# Patient Record
Sex: Female | Born: 1955 | Hispanic: Yes | Smoking: Never smoker
Health system: Southern US, Community
[De-identification: ages and names within clinical notes are randomized; demographics above are authoritative.]

## PROBLEM LIST (undated history)

## (undated) DIAGNOSIS — J40 Bronchitis, not specified as acute or chronic: Secondary | ICD-10-CM

## (undated) DIAGNOSIS — E039 Hypothyroidism, unspecified: Secondary | ICD-10-CM

## (undated) DIAGNOSIS — F4310 Post-traumatic stress disorder, unspecified: Secondary | ICD-10-CM

## (undated) DIAGNOSIS — M1990 Unspecified osteoarthritis, unspecified site: Secondary | ICD-10-CM

## (undated) DIAGNOSIS — I639 Cerebral infarction, unspecified: Secondary | ICD-10-CM

## (undated) DIAGNOSIS — F329 Major depressive disorder, single episode, unspecified: Secondary | ICD-10-CM

## (undated) DIAGNOSIS — D649 Anemia, unspecified: Secondary | ICD-10-CM

## (undated) DIAGNOSIS — F32A Depression, unspecified: Secondary | ICD-10-CM

## (undated) DIAGNOSIS — F419 Anxiety disorder, unspecified: Secondary | ICD-10-CM

## (undated) DIAGNOSIS — K219 Gastro-esophageal reflux disease without esophagitis: Secondary | ICD-10-CM

## (undated) DIAGNOSIS — M810 Age-related osteoporosis without current pathological fracture: Secondary | ICD-10-CM

## (undated) DIAGNOSIS — Z973 Presence of spectacles and contact lenses: Secondary | ICD-10-CM

## (undated) DIAGNOSIS — G459 Transient cerebral ischemic attack, unspecified: Secondary | ICD-10-CM

## (undated) DIAGNOSIS — I10 Essential (primary) hypertension: Secondary | ICD-10-CM

## (undated) HISTORY — DX: Transient cerebral ischemic attack, unspecified: G45.9

## (undated) HISTORY — DX: Presence of spectacles and contact lenses: Z97.3

## (undated) HISTORY — DX: Cerebral infarction, unspecified: I63.9

## (undated) HISTORY — DX: Depression, unspecified: F32.A

## (undated) HISTORY — DX: Age-related osteoporosis without current pathological fracture: M81.0

## (undated) HISTORY — DX: Essential (primary) hypertension: I10

## (undated) HISTORY — DX: Hypothyroidism, unspecified: E03.9

## (undated) HISTORY — PX: BACK SURGERY: SHX140

## (undated) HISTORY — DX: Post-traumatic stress disorder, unspecified: F43.10

## (undated) HISTORY — PX: UPPER GASTROINTESTINAL ENDOSCOPY: SHX188

## (undated) HISTORY — DX: Anxiety disorder, unspecified: F41.9

## (undated) HISTORY — PX: BREAST SURGERY: SHX581

## (undated) HISTORY — DX: Unspecified osteoarthritis, unspecified site: M19.90

## (undated) HISTORY — PX: COLONOSCOPY: SHX174

---
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
## 1989-09-22 HISTORY — PX: VEIN SURGERY: SHX48

## 2004-09-22 HISTORY — PX: AUGMENTATION MAMMAPLASTY: SUR837

## 2019-06-23 DIAGNOSIS — U071 COVID-19: Secondary | ICD-10-CM

## 2019-06-23 HISTORY — DX: COVID-19: U07.1

## 2019-07-08 ENCOUNTER — Other Ambulatory Visit: Payer: Self-pay

## 2019-07-08 DIAGNOSIS — Z20822 Contact with and (suspected) exposure to covid-19: Secondary | ICD-10-CM

## 2019-07-10 LAB — NOVEL CORONAVIRUS, NAA: SARS-CoV-2, NAA: DETECTED — AB

## 2019-07-15 ENCOUNTER — Other Ambulatory Visit: Payer: Self-pay

## 2019-07-15 DIAGNOSIS — R071 Chest pain on breathing: Secondary | ICD-10-CM | POA: Insufficient documentation

## 2019-07-15 DIAGNOSIS — U071 COVID-19: Secondary | ICD-10-CM | POA: Insufficient documentation

## 2019-07-15 DIAGNOSIS — R0600 Dyspnea, unspecified: Secondary | ICD-10-CM | POA: Insufficient documentation

## 2019-07-15 LAB — CBC WITH DIFFERENTIAL/PLATELET
Abs Immature Granulocytes: 0.01 10*3/uL (ref 0.00–0.07)
Basophils Absolute: 0 10*3/uL (ref 0.0–0.1)
Basophils Relative: 0 %
Eosinophils Absolute: 0 10*3/uL (ref 0.0–0.5)
Eosinophils Relative: 0 %
HCT: 36.3 % (ref 36.0–46.0)
Hemoglobin: 12.5 g/dL (ref 12.0–15.0)
Immature Granulocytes: 0 %
Lymphocytes Relative: 24 %
Lymphs Abs: 0.9 10*3/uL (ref 0.7–4.0)
MCH: 29.3 pg (ref 26.0–34.0)
MCHC: 34.4 g/dL (ref 30.0–36.0)
MCV: 85.2 fL (ref 80.0–100.0)
Monocytes Absolute: 0.3 10*3/uL (ref 0.1–1.0)
Monocytes Relative: 7 %
Neutro Abs: 2.6 10*3/uL (ref 1.7–7.7)
Neutrophils Relative %: 69 %
Platelets: 196 10*3/uL (ref 150–400)
RBC: 4.26 MIL/uL (ref 3.87–5.11)
RDW: 12.8 % (ref 11.5–15.5)
WBC: 3.8 10*3/uL — ABNORMAL LOW (ref 4.0–10.5)
nRBC: 0 % (ref 0.0–0.2)

## 2019-07-15 NOTE — ED Notes (Signed)
Patient to waiting room via wheelchair by EMS.  Per EMS patient received positive COVID results Friday last week.  Started with fever yesterday and today and having chest tightness.  EMS vital signs:  Hr - 70's; bp 150/90; pulse oxi 97-98% on room air.

## 2019-07-15 NOTE — ED Triage Notes (Signed)
Patient reports being positive for COVID (received results 1 week ago).  Yesterday with fever and chest tightness.  Also reports having right flank pain.

## 2019-07-16 ENCOUNTER — Emergency Department: Payer: Self-pay

## 2019-07-16 ENCOUNTER — Emergency Department
Admission: EM | Admit: 2019-07-16 | Discharge: 2019-07-16 | Disposition: A | Discharge status: Home / Self Care | Payer: Self-pay | Attending: Emergency Medicine | Admitting: Emergency Medicine

## 2019-07-16 DIAGNOSIS — U071 COVID-19: Secondary | ICD-10-CM

## 2019-07-16 DIAGNOSIS — R0600 Dyspnea, unspecified: Secondary | ICD-10-CM

## 2019-07-16 DIAGNOSIS — R079 Chest pain, unspecified: Secondary | ICD-10-CM

## 2019-07-16 LAB — COMPREHENSIVE METABOLIC PANEL
ALT: 121 U/L — ABNORMAL HIGH (ref 0–44)
AST: 93 U/L — ABNORMAL HIGH (ref 15–41)
Albumin: 4.2 g/dL (ref 3.5–5.0)
Alkaline Phosphatase: 60 U/L (ref 38–126)
Anion gap: 13 (ref 5–15)
BUN: 21 mg/dL (ref 8–23)
CO2: 25 mmol/L (ref 22–32)
Calcium: 9.6 mg/dL (ref 8.9–10.3)
Chloride: 101 mmol/L (ref 98–111)
Creatinine, Ser: 0.6 mg/dL (ref 0.44–1.00)
GFR calc Af Amer: >60 mL/min (ref >60)
GFR calc non Af Amer: >60 mL/min (ref >60)
Glucose, Bld: 104 mg/dL — ABNORMAL HIGH (ref 70–99)
Potassium: 3.8 mmol/L (ref 3.5–5.1)
Sodium: 139 mmol/L (ref 135–145)
Total Bilirubin: 0.5 mg/dL (ref 0.3–1.2)
Total Protein: 7.7 g/dL (ref 6.5–8.1)

## 2019-07-16 LAB — URINALYSIS, COMPLETE (UACMP) WITH MICROSCOPIC
Bacteria, UA: NONE SEEN
Bilirubin Urine: NEGATIVE
Glucose, UA: NEGATIVE mg/dL
Hgb urine dipstick: NEGATIVE
Ketones, ur: NEGATIVE mg/dL
Leukocytes,Ua: NEGATIVE
Nitrite: NEGATIVE
Protein, ur: NEGATIVE mg/dL
Specific Gravity, Urine: 1.019 (ref 1.005–1.030)
Squamous Epithelial / HPF: NONE SEEN (ref 0–5)
pH: 6 (ref 5.0–8.0)

## 2019-07-16 LAB — FIBRIN DERIVATIVES D-DIMER (ARMC ONLY): Fibrin derivatives D-dimer (ARMC): 558.8 ng/mL (FEU) — ABNORMAL HIGH (ref 0.00–499.00)

## 2019-07-16 LAB — TROPONIN I (HIGH SENSITIVITY)
Troponin I (High Sensitivity): 2 ng/L (ref <18)
Troponin I (High Sensitivity): 3 ng/L (ref <18)

## 2019-07-16 MED ORDER — IOHEXOL 350 MG/ML SOLN
75.0000 mL | Freq: Once | INTRAVENOUS | Status: AC | PRN
  Administered 2019-07-16: 75 mL via INTRAVENOUS

## 2019-07-16 MED ORDER — AZITHROMYCIN 250 MG PO TABS: ORAL_TABLET | ORAL | 0 refills | Status: AC

## 2019-07-16 MED ORDER — ALBUTEROL SULFATE HFA 108 (90 BASE) MCG/ACT IN AERS: 2.0000 | INHALATION_SPRAY | Freq: Four times a day (QID) | RESPIRATORY_TRACT | 0 refills | Status: DC | PRN

## 2019-07-16 MED ORDER — GUAIFENESIN-CODEINE 100-10 MG/5ML PO SOLN: 5.0000 mL | Freq: Four times a day (QID) | ORAL | 0 refills | Status: DC | PRN

## 2019-07-16 NOTE — ED Notes (Signed)
Patient transported to CT by CT tech

## 2019-07-16 NOTE — ED Notes (Signed)
Patient returned from CT

## 2019-07-16 NOTE — ED Notes (Signed)
Tested +covid Friday patient just started running a fever and having chest pain tonight. Patient states taking tylenol, elderberry, and mucex. Patient just recently moved to [facility] from [facility]. Patient states she had lost her sense of taste, but have just started coming back. Patient denies SOB or difficulty breathing.

## 2019-07-16 NOTE — ED Provider Notes (Signed)
[facility] Emergency Department Provider Note  Time seen: 2:41 AM  I have reviewed the triage vital signs and the nursing notes.   HISTORY  Chief Complaint Chest Pain   HPI [name] is a 63 y.o. female with no significant past medical history presents to the emergency department for chest pain.  According to the patient approximately 10 days ago she developed cough and shortness of breath symptoms tested positive for Covid 7 days ago.  States she has been experiencing intermittent chest pain/tightness however over the past 2 to 3 days she felt like the chest pain is worsened.  Somewhat worse with deep inspiration in her right lower posterior chest.  Denies any vomiting.  Low-grade fevers.  Minimal shortness of breath per patient.  States overall her symptoms have been "mild."   No past medical history on file.  There are no active problems to display for this patient.    Prior to Admission medications   Not on File    No Known Allergies  No family history on file.  Social History Social History   Tobacco Use  . Smoking status: Not on file  Substance Use Topics  . Alcohol use: Not on file  . Drug use: Not on file    Review of Systems Constitutional: Negative for fever. Cardiovascular: Positive for chest tightness.  Sharp chest pain in the right posterior lower chest. Respiratory: Mild shortness of breath.  Minimal cough. Gastrointestinal: Negative for abdominal pain Musculoskeletal: Negative for musculoskeletal complaints Skin: Negative for skin complaints  Neurological: Negative for headache All other ROS negative  ____________________________________________   PHYSICAL EXAM:  VITAL SIGNS: ED Triage Vitals  Enc Vitals Group     BP 07/15/19 2304 (!) 125/96     Pulse Rate 07/15/19 2304 92     Resp 07/15/19 2304 18     Temp 07/15/19 2304 99.9 F (37.7 C)     Temp Source 07/15/19 2304 Oral     SpO2 07/15/19 2304 98 %     Weight  07/15/19 2305 156 lb (70.8 kg)     Height 07/15/19 2305 5\' 5"  (1.651 m)     Head Circumference --      Peak Flow --      Pain Score 07/15/19 2305 9     Pain Loc --      Pain Edu? --      Excl. in [place]? --    Constitutional: Alert and oriented. Well appearing and in no distress. Eyes: Normal exam ENT      Head: Normocephalic and atraumatic.      Mouth/Throat: Mucous membranes are moist. Cardiovascular: Normal rate, regular rhythm. Respiratory: Normal respiratory effort without tachypnea nor retractions. Breath sounds are clear  Gastrointestinal: Soft and nontender. No distention. Musculoskeletal: Nontender with normal range of motion in all extremities. Neurologic:  Normal speech and language. No gross focal neurologic deficits  Skin:  Skin is warm, dry and intact.  Psychiatric: Mood and affect are normal.  ____________________________________________    EKG  EKG viewed and interpreted by myself shows a normal sinus rhythm 82 bpm with a narrow QRS, normal axis, normal intervals, no concerning ST changes.  ____________________________________________    RADIOLOGY  CT scan negative for PE.  Groundglass opacities bilaterally.  ____________________________________________   INITIAL IMPRESSION / ASSESSMENT AND PLAN / ED COURSE  Pertinent labs & imaging results that were available during my care of the patient were reviewed by me and considered in my medical  decision making (see chart for details).   Patient presents to the emergency department for chest pain.  Describes it is somewhat pleuritic in the right posterior lower chest and a mild diffuse tightness sensation.  Patient's lab work is largely reassuring.  Troponin negative.  Patient's D-dimer is elevated which is to be expected during Covid however given her pleuritic chest pain with elevated D-dimer I believe the patient would benefit from CTA imaging to rule out PE.  Patient agreeable to plan of care.  CT negative for PE  shows bilateral groundglass opacities consistent with COVID-19.  Patient continues to sat 100% on room air.  We will discharge with a course of Zithromax as a precaution, albuterol nebulizer and codeine cough medication.  [name] was evaluated in Emergency Department on 07/16/2019 for the symptoms described in the history of present illness. She was evaluated in the context of the global COVID-19 pandemic, which necessitated consideration that the patient might be at risk for infection with the SARS-CoV-2 virus that causes COVID-19. Institutional protocols and algorithms that pertain to the evaluation of patients at risk for COVID-19 are in a state of rapid change based on information released by regulatory bodies including the CDC and federal and state organizations. These policies and algorithms were followed during the patient's care in the ED.  ____________________________________________   FINAL CLINICAL IMPRESSION(S) / ED DIAGNOSES  Chest pain COVID-19   Minna Antis, MD 07/16/19 [phone]

## 2019-09-27 ENCOUNTER — Other Ambulatory Visit: Payer: Self-pay

## 2019-09-27 ENCOUNTER — Encounter: Payer: Self-pay | Admitting: Emergency Medicine

## 2019-09-27 ENCOUNTER — Ambulatory Visit
Admission: EM | Admit: 2019-09-27 | Discharge: 2019-09-27 | Disposition: A | Discharge status: Home / Self Care | Payer: Medicare Other

## 2019-09-27 DIAGNOSIS — J029 Acute pharyngitis, unspecified: Secondary | ICD-10-CM | POA: Diagnosis not present

## 2019-09-27 DIAGNOSIS — R0981 Nasal congestion: Secondary | ICD-10-CM | POA: Diagnosis not present

## 2019-09-27 DIAGNOSIS — R05 Cough: Secondary | ICD-10-CM

## 2019-09-27 DIAGNOSIS — B349 Viral infection, unspecified: Secondary | ICD-10-CM

## 2019-09-27 LAB — POC SARS CORONAVIRUS 2 AG -  ED: SARS Coronavirus 2 Ag: NEGATIVE

## 2019-09-27 MED ORDER — ALBUTEROL SULFATE HFA 108 (90 BASE) MCG/ACT IN AERS: 2.0000 | INHALATION_SPRAY | Freq: Four times a day (QID) | RESPIRATORY_TRACT | 0 refills | Status: DC | PRN

## 2019-09-27 NOTE — Discharge Instructions (Signed)
Use the albuterol inhaler as needed for wheezing or shortness of breath.   Your COVID test is pending.  You should self quarantine until your test result is back and is negative.    Go to the emergency department if you develop high fever, shortness of breath, severe diarrhea, or other concerning symptoms.

## 2019-09-27 NOTE — ED Triage Notes (Addendum)
Patient in office today c/o nasal congestion itchy throat and cough x1wk  Stated she was covid positive in Oct 2020  OTC: Mucus cough,robitussin, inhaler and tylenol

## 2019-09-27 NOTE — ED Provider Notes (Signed)
Renal[facility] Fiddler    CSN: [id number] Arrival date & time: 09/27/19  1251      History   Chief Complaint Chief Complaint  Patient presents with  . Nasal Congestion    HPI [name] is a 64 y.o. female.   Patient presents with nasal congestion, itchy throat, and nonproductive cough x 5 days.  She request a refill on her albuterol inhaler which was prescribed when she was diagnosed with COVID in October.  Patient request COVID test today.  She denies fever, chills, difficulty swallowing, shortness of breath, vomiting, diarrhea, rash, or other symptoms.  She has been treating her symptoms at home with Robitussin, albuterol inhaler, and Tylenol.  Patient states her symptoms are worse when exposed to her neighbors cigarette smoke.  Patient was positive for COVID on 07/08/2019.  The history is provided by the patient.    Past Medical History:  Diagnosis Date  . COVID-19 06/23/2019  . Hypertension   . Thyroid disease     There are no problems to display for this patient.   Past Surgical History:  Procedure Laterality Date  . BACK SURGERY      OB History   No obstetric history on file.      Home Medications    Prior to Admission medications   Medication Sig Start Date End Date Taking? Authorizing Provider  albuterol (VENTOLIN HFA) 108 (90 Base) MCG/ACT inhaler Inhale 2 puffs into the lungs every 6 (six) hours as needed for wheezing or shortness of breath. 09/27/19   Mickie Bail, NP  diclofenac (VOLTAREN) 75 MG EC tablet Take 75 mg by mouth 2 (two) times daily. 09/26/19   [provider]  guaiFENesin-codeine 100-10 MG/5ML syrup Take 5 mLs by mouth every 6 (six) hours as needed for cough. 07/16/19   Minna Antis, MD  hydrochlorothiazide (HYDRODIURIL) 12.5 MG tablet Take 12.5 mg by mouth daily. 09/26/19   [provider]  levothyroxine (SYNTHROID) 150 MCG tablet Take 150 mcg by mouth daily. 09/26/19   [provider]  meloxicam (MOBIC) 7.5 MG  tablet Take 7.5 mg by mouth 2 (two) times daily as needed. 07/05/19   [provider]    Family History No family history on file.  Social History Social History   Tobacco Use  . Smoking status: Never Smoker  . Smokeless tobacco: Never Used  Substance Use Topics  . Alcohol use: Not on file  . Drug use: Not on file     Allergies   Patient has no known allergies.   Review of Systems Review of Systems  Constitutional: Negative for chills and fever.  HENT: Positive for congestion and sore throat. Negative for ear pain.   Eyes: Negative for pain and visual disturbance.  Respiratory: Positive for cough. Negative for shortness of breath.   Cardiovascular: Negative for chest pain and palpitations.  Gastrointestinal: Negative for abdominal pain, diarrhea, nausea and vomiting.  Genitourinary: Negative for dysuria and hematuria.  Musculoskeletal: Negative for arthralgias and back pain.  Skin: Negative for color change and rash.  Neurological: Negative for seizures and syncope.  All other systems reviewed and are negative.    Physical Exam Triage Vital Signs ED Triage Vitals [09/27/19 1252]  Enc Vitals Group     BP 108/73     Pulse Rate 78     Resp 16     Temp 99 F (37.2 C)     Temp Source Oral     SpO2 94 %  Weight      Height      Head Circumference      Peak Flow      Pain Score      Pain Loc      Pain Edu?      Excl. in [place]?    No data found.  Updated Vital Signs BP 108/73 (BP Location: Left Arm)   Pulse 78   Temp 99 F (37.2 C) (Oral)   Resp 16   Wt 163 lb (73.9 kg)   SpO2 94%   BMI 27.12 kg/m   Visual Acuity Right Eye Distance:   Left Eye Distance:   Bilateral Distance:    Right Eye Near:   Left Eye Near:    Bilateral Near:     Physical Exam Vitals and nursing note reviewed.  Constitutional:      General: She is not in acute distress.    Appearance: She is well-developed. She is not ill-appearing.  HENT:     Head:  Normocephalic and atraumatic.     Right Ear: Tympanic membrane normal.     Left Ear: Tympanic membrane normal.     Nose: Nose normal.     Mouth/Throat:     Mouth: Mucous membranes are moist.     Pharynx: Oropharynx is clear.  Eyes:     Conjunctiva/sclera: Conjunctivae normal.  Cardiovascular:     Rate and Rhythm: Normal rate and regular rhythm.     Heart sounds: No murmur.  Pulmonary:     Effort: Pulmonary effort is normal. No respiratory distress.     Breath sounds: Normal breath sounds.  Abdominal:     General: Bowel sounds are normal.     Palpations: Abdomen is soft.     Tenderness: There is no abdominal tenderness. There is no guarding or rebound.  Musculoskeletal:     Cervical back: Neck supple.  Skin:    General: Skin is warm and dry.     Findings: No rash.  Neurological:     General: No focal deficit present.     Mental Status: She is alert and oriented to person, place, and time.  Psychiatric:        Mood and Affect: Mood normal.        Behavior: Behavior normal.      UC Treatments / Results  Labs (all labs ordered are listed, but only abnormal results are displayed) Labs Reviewed  NOVEL CORONAVIRUS, NAA  POC SARS CORONAVIRUS 2 AG -  ED    EKG   Radiology No results found.  Procedures Procedures (including critical care time)  Medications Ordered in UC Medications - No data to display  Initial Impression / Assessment and Plan / UC Course  I have reviewed the triage vital signs and the nursing notes.  Pertinent labs & imaging results that were available during my care of the patient were reviewed by me and considered in my medical decision making (see chart for details).   Viral Illness.  Refill on albuterol inhaler provided.  POC COVID negative; PCR pending.  Instructed patient to self quarantine until the test result is back.  Instructed patient to go to the emergency department if she develops high fever, shortness of breath, severe diarrhea, or  other concerning symptoms.  Patient agrees with plan of care.    Final Clinical Impressions(s) / UC Diagnoses   Final diagnoses:  Viral illness     Discharge Instructions     Use the albuterol inhaler as needed  for wheezing or shortness of breath.   Your COVID test is pending.  You should self quarantine until your test result is back and is negative.    Go to the emergency department if you develop high fever, shortness of breath, severe diarrhea, or other concerning symptoms.       ED Prescriptions    Medication Sig Dispense Auth. Provider   albuterol (VENTOLIN HFA) 108 (90 Base) MCG/ACT inhaler  (Status: Discontinued) Inhale 2 puffs into the lungs every 6 (six) hours as needed for wheezing or shortness of breath. 18 g Sharion Balloon, NP   albuterol (VENTOLIN HFA) 108 (90 Base) MCG/ACT inhaler Inhale 2 puffs into the lungs every 6 (six) hours as needed for wheezing or shortness of breath. 18 g Sharion Balloon, NP     PDMP not reviewed this encounter.   Sharion Balloon, NP 09/27/19 [phone]

## 2019-09-29 ENCOUNTER — Encounter: Payer: Self-pay | Admitting: Intensive Care

## 2019-09-29 ENCOUNTER — Other Ambulatory Visit: Payer: Self-pay

## 2019-09-29 ENCOUNTER — Emergency Department
Admission: EM | Admit: 2019-09-29 | Discharge: 2019-09-29 | Disposition: A | Discharge status: Home / Self Care | Payer: Medicare Other | Attending: Emergency Medicine | Admitting: Emergency Medicine

## 2019-09-29 ENCOUNTER — Emergency Department: Payer: Medicare Other

## 2019-09-29 DIAGNOSIS — Z8616 Personal history of COVID-19: Secondary | ICD-10-CM | POA: Diagnosis not present

## 2019-09-29 DIAGNOSIS — Z79899 Other long term (current) drug therapy: Secondary | ICD-10-CM | POA: Insufficient documentation

## 2019-09-29 DIAGNOSIS — R05 Cough: Secondary | ICD-10-CM | POA: Diagnosis not present

## 2019-09-29 DIAGNOSIS — J9801 Acute bronchospasm: Secondary | ICD-10-CM | POA: Diagnosis not present

## 2019-09-29 LAB — NOVEL CORONAVIRUS, NAA: SARS-CoV-2, NAA: NOT DETECTED

## 2019-09-29 MED ORDER — METHYLPREDNISOLONE 4 MG PO TBPK: ORAL_TABLET | ORAL | 0 refills | Status: DC

## 2019-09-29 MED ORDER — BENZONATATE 100 MG PO CAPS: 200.0000 mg | ORAL_CAPSULE | Freq: Three times a day (TID) | ORAL | 0 refills | Status: DC | PRN

## 2019-09-29 NOTE — ED Triage Notes (Signed)
Patient c/o cough, throat burning, chest burning with cough. Diagnosed COVID + 07/08/19. Reports she can smell her neighbors smoke coming in through her vent and then starts affecting her.

## 2019-09-29 NOTE — ED Triage Notes (Signed)
First Nurse Note:  C/O nasal congestion x 1 week.  Seen through urgent care 2 days ago for same, but states last night she began to feel SOB.  Had COVID test 2 days ago, negative result.  Patient was COVI D positive in October.  AAOx3.  Skin warm and dry. NAD.  No SOB/ DOE noted.

## 2019-09-29 NOTE — ED Notes (Signed)
Pt c/o nasal congestion and throat irritation from her neighbor smoking. Had a negative covid test 2 days ago.

## 2019-09-29 NOTE — Discharge Instructions (Addendum)
Follow discharge care instruction take medication as directed. °

## 2019-09-29 NOTE — ED Provider Notes (Signed)
[facility] Emergency Department Provider Note   ____________________________________________   First MD Initiated Contact with Patient 09/29/19 1227     (approximate)  I have reviewed the triage vital signs and the nursing notes.   HISTORY  Chief Complaint Nasal Congestion, URI, and Cough    HPI [name] is a 64 y.o. female patient presents with 1 week of nasal congestion and cough.  Patient states the cough is secondary to irritant from cigarette smoking from her upstairs neighbor.  Patient states this mild anxiety secondary to the coughing spells.  Patient denies recent travel or known exposure to COVID-19.  Patient tested negative for COVID-19 2 days ago.  Patient was +3 months ago.         Past Medical History:  Diagnosis Date  . COVID-19 06/23/2019  . Thyroid disease     There are no problems to display for this patient.   Past Surgical History:  Procedure Laterality Date  . BACK SURGERY      Prior to Admission medications   Medication Sig Start Date End Date Taking? Authorizing Provider  albuterol (VENTOLIN HFA) 108 (90 Base) MCG/ACT inhaler Inhale 2 puffs into the lungs every 6 (six) hours as needed for wheezing or shortness of breath. 09/27/19   Sharion Balloon, NP  benzonatate (TESSALON PERLES) 100 MG capsule Take 2 capsules (200 mg total) by mouth 3 (three) times daily as needed. 09/29/19 09/28/20  Sable Feil, PA-C  diclofenac (VOLTAREN) 75 MG EC tablet Take 75 mg by mouth 2 (two) times daily. 09/26/19   [provider]  guaiFENesin-codeine 100-10 MG/5ML syrup Take 5 mLs by mouth every 6 (six) hours as needed for cough. 07/16/19   Harvest Dark, MD  hydrochlorothiazide (HYDRODIURIL) 12.5 MG tablet Take 12.5 mg by mouth daily. 09/26/19   [provider]  levothyroxine (SYNTHROID) 150 MCG tablet Take 150 mcg by mouth daily. 09/26/19   [provider]  meloxicam (MOBIC) 7.5 MG tablet Take 7.5 mg by mouth 2  (two) times daily as needed. 07/05/19   [provider]  methylPREDNISolone (MEDROL DOSEPAK) 4 MG TBPK tablet Take Tapered dose as directed 09/29/19   Sable Feil, PA-C    Allergies Patient has no known allergies.  History reviewed. No pertinent family history.  Social History Social History   Tobacco Use  . Smoking status: Never Smoker  . Smokeless tobacco: Never Used  Substance Use Topics  . Alcohol use: Yes    Alcohol/week: 2.0 standard drinks    Types: 2 Glasses of wine per week  . Drug use: Never    Review of Systems Constitutional: No fever/chills Eyes: No visual changes. ENT: No sore throat. Cardiovascular: Denies chest pain. Respiratory: Denies shortness of breath. Gastrointestinal: No abdominal pain.  No nausea, no vomiting.  No diarrhea.  No constipation. Genitourinary: Negative for dysuria. Musculoskeletal: Negative for back pain. Skin: Negative for rash. Neurological: Negative for headaches, focal weakness or numbness. Endocrine:  Hypothyroidism. ____________________________________________   PHYSICAL EXAM:  VITAL SIGNS: ED Triage Vitals [09/29/19 1215]  Enc Vitals Group     BP 128/84     Pulse Rate 72     Resp 16     Temp 99.1 F (37.3 C)     Temp Source Oral     SpO2 100 %     Weight 163 lb (73.9 kg)     Height 5\' 5"  (1.651 m)     Head Circumference  Peak Flow      Pain Score 8     Pain Loc      Pain Edu?      Excl. in [place]?    Constitutional: Alert and oriented. Well appearing and in no acute distress. Nose: No congestion/rhinnorhea. Mouth/Throat: Mucous membranes are moist.  Oropharynx non-erythematous. Neck: No stridor.   Cardiovascular: Normal rate, regular rhythm. Grossly normal heart sounds.  Good peripheral circulation. Respiratory: Normal respiratory effort.  No retractions. Lungs CTAB. Gastrointestinal: Soft and nontender. No distention. No abdominal bruits. No CVA tenderness. Neurologic:  Normal speech and language.  No gross focal neurologic deficits are appreciated. No gait instability. Skin:  Skin is warm, dry and intact. No rash noted. Psychiatric: Mood and affect are normal. Speech and behavior are normal.  ____________________________________________   LABS (all labs ordered are listed, but only abnormal results are displayed)  Labs Reviewed - No data to display ____________________________________________  EKG   ____________________________________________  RADIOLOGY  ED MD interpretation:    Official radiology report(s): DG Chest 1 View  Result Date: 09/29/2019 CLINICAL DATA:  Cough with burning in chest. Additional history provided: COVID positive 07/08/2019, smoke exposure EXAM: CHEST  1 VIEW COMPARISON:  CT angiogram chest 07/16/2019 FINDINGS: Heart size within normal limits. Nonspecific elevation of the left hemidiaphragm. Probable minimal bibasilar atelectasis. No evidence of airspace consolidation. No evidence of pleural effusion or pneumothorax. No acute bony abnormality. IMPRESSION: Probable minimal bibasilar atelectasis. No evidence of airspace consolidation. Electronically Signed   By: Jackey Loge DO   On: 09/29/2019 12:46    ____________________________________________   PROCEDURES  Procedure(s) performed (including Critical Care):  Procedures   ____________________________________________   INITIAL IMPRESSION / ASSESSMENT AND PLAN / ED COURSE  As part of my medical decision making, I reviewed the following data within the electronic MEDICAL RECORD NUMBER      Patient complaining nasal congestion and cough secondary to irritation from cigarette smoke.  Patient checks x-ray was grossly unremarkable.  Patient physical exam is consistent with bronchospasm.  Patient given discharge care instruction advised take medication as directed.  Patient also advised to purchase a home air purifier.  Advise follow-up PCP.   AFSHEEN ANTONY was evaluated in Emergency Department on  09/29/2019 for the symptoms described in the history of present illness. She was evaluated in the context of the global COVID-19 pandemic, which necessitated consideration that the patient might be at risk for infection with the SARS-CoV-2 virus that causes COVID-19. Institutional protocols and algorithms that pertain to the evaluation of patients at risk for COVID-19 are in a state of rapid change based on information released by regulatory bodies including the CDC and federal and state organizations. These policies and algorithms were followed during the patient's care in the ED.       ____________________________________________   FINAL CLINICAL IMPRESSION(S) / ED DIAGNOSES  Final diagnoses:  Cough due to bronchospasm     ED Discharge Orders         Ordered    methylPREDNISolone (MEDROL DOSEPAK) 4 MG TBPK tablet     09/29/19 1301    benzonatate (TESSALON PERLES) 100 MG capsule  3 times daily PRN     09/29/19 1301           Note:  This document was prepared using Dragon voice recognition software and may include unintentional dictation errors.    Joni Reining, PA-C 09/29/19 1306    Shaune Pollack, MD 09/30/19 [phone]

## 2019-10-04 ENCOUNTER — Other Ambulatory Visit: Payer: Self-pay

## 2019-10-04 ENCOUNTER — Emergency Department: Payer: Medicare Other

## 2019-10-04 ENCOUNTER — Encounter: Payer: Self-pay | Admitting: Emergency Medicine

## 2019-10-04 ENCOUNTER — Emergency Department
Admission: EM | Admit: 2019-10-04 | Discharge: 2019-10-04 | Disposition: A | Discharge status: Home / Self Care | Payer: Medicare Other | Attending: Emergency Medicine | Admitting: Emergency Medicine

## 2019-10-04 DIAGNOSIS — Z79899 Other long term (current) drug therapy: Secondary | ICD-10-CM | POA: Insufficient documentation

## 2019-10-04 DIAGNOSIS — J9801 Acute bronchospasm: Secondary | ICD-10-CM | POA: Diagnosis not present

## 2019-10-04 DIAGNOSIS — R0602 Shortness of breath: Secondary | ICD-10-CM | POA: Diagnosis not present

## 2019-10-04 DIAGNOSIS — R091 Pleurisy: Secondary | ICD-10-CM | POA: Diagnosis not present

## 2019-10-04 DIAGNOSIS — R0981 Nasal congestion: Secondary | ICD-10-CM | POA: Diagnosis present

## 2019-10-04 DIAGNOSIS — R079 Chest pain, unspecified: Secondary | ICD-10-CM | POA: Diagnosis not present

## 2019-10-04 LAB — BASIC METABOLIC PANEL
Anion gap: 9 (ref 5–15)
BUN: 19 mg/dL (ref 8–23)
CO2: 28 mmol/L (ref 22–32)
Calcium: 9.8 mg/dL (ref 8.9–10.3)
Chloride: 103 mmol/L (ref 98–111)
Creatinine, Ser: 0.7 mg/dL (ref 0.44–1.00)
GFR calc Af Amer: >60 mL/min (ref >60)
GFR calc non Af Amer: >60 mL/min (ref >60)
Glucose, Bld: 103 mg/dL — ABNORMAL HIGH (ref 70–99)
Potassium: 3.6 mmol/L (ref 3.5–5.1)
Sodium: 140 mmol/L (ref 135–145)

## 2019-10-04 LAB — CBC
HCT: 39.4 % (ref 36.0–46.0)
Hemoglobin: 13.1 g/dL (ref 12.0–15.0)
MCH: 29 pg (ref 26.0–34.0)
MCHC: 33.2 g/dL (ref 30.0–36.0)
MCV: 87.4 fL (ref 80.0–100.0)
Platelets: 266 10*3/uL (ref 150–400)
RBC: 4.51 MIL/uL (ref 3.87–5.11)
RDW: 13 % (ref 11.5–15.5)
WBC: 7 10*3/uL (ref 4.0–10.5)
nRBC: 0 % (ref 0.0–0.2)

## 2019-10-04 LAB — TROPONIN I (HIGH SENSITIVITY): Troponin I (High Sensitivity): <2 ng/L (ref <18)

## 2019-10-04 MED ORDER — BENZONATATE 100 MG PO CAPS: 200.0000 mg | ORAL_CAPSULE | Freq: Three times a day (TID) | ORAL | 0 refills | Status: DC | PRN

## 2019-10-04 MED ORDER — BENZONATATE 100 MG PO CAPS: 100.0000 mg | ORAL_CAPSULE | Freq: Three times a day (TID) | ORAL | 0 refills | Status: DC | PRN

## 2019-10-04 NOTE — ED Provider Notes (Signed)
[facility] Emergency Department Provider Note  ____________________________________________   First MD Initiated Contact with Patient 10/04/19 1632     (approximate)  I have reviewed the triage vital signs and the nursing notes.   HISTORY  Chief Complaint Chest Pain and Shortness of Breath    HPI [name] is a 64 y.o. female with below list of previous medical conditions presents to the emergency department secondary to 2-week history of nasal congestion nonproductive cough central chest discomfort only while residing in her apartment.  Patient states that she believes this is secondary to the fact that her neighbor smokes.  She states that when she is not at home she has none of the above-stated symptoms but whenever she returns home and there is noted cigarette smoke that the symptoms recur.      Past Medical History:  Diagnosis Date  . COVID-19 06/23/2019  . Thyroid disease     There are no problems to display for this patient.   Past Surgical History:  Procedure Laterality Date  . BACK SURGERY      Prior to Admission medications   Medication Sig Start Date End Date Taking? Authorizing Provider  albuterol (VENTOLIN HFA) 108 (90 Base) MCG/ACT inhaler Inhale 2 puffs into the lungs every 6 (six) hours as needed for wheezing or shortness of breath. 09/27/19   Mickie Bail, NP  benzonatate (TESSALON PERLES) 100 MG capsule Take 2 capsules (200 mg total) by mouth 3 (three) times daily as needed. 09/29/19 09/28/20  Joni Reining, PA-C  diclofenac (VOLTAREN) 75 MG EC tablet Take 75 mg by mouth 2 (two) times daily. 09/26/19   [provider]  guaiFENesin-codeine 100-10 MG/5ML syrup Take 5 mLs by mouth every 6 (six) hours as needed for cough. 07/16/19   Minna Antis, MD  hydrochlorothiazide (HYDRODIURIL) 12.5 MG tablet Take 12.5 mg by mouth daily. 09/26/19   [provider]  levothyroxine (SYNTHROID) 150 MCG tablet Take 150 mcg by  mouth daily. 09/26/19   [provider]  meloxicam (MOBIC) 7.5 MG tablet Take 7.5 mg by mouth 2 (two) times daily as needed. 07/05/19   [provider]  methylPREDNISolone (MEDROL DOSEPAK) 4 MG TBPK tablet Take Tapered dose as directed 09/29/19   Joni Reining, PA-C    Allergies Patient has no known allergies.  No family history on file.  Social History Social History   Tobacco Use  . Smoking status: Never Smoker  . Smokeless tobacco: Never Used  Substance Use Topics  . Alcohol use: Yes    Alcohol/week: 2.0 standard drinks    Types: 2 Glasses of wine per week  . Drug use: Never    Review of Systems Constitutional: No fever/chills Eyes: No visual changes. ENT: No sore throat. Cardiovascular: Denies chest pain. Respiratory: Denies shortness of breath. Gastrointestinal: No abdominal pain.  No nausea, no vomiting.  No diarrhea.  No constipation. Genitourinary: Negative for dysuria. Musculoskeletal: Negative for neck pain.  Negative for back pain. Integumentary: Negative for rash. Neurological: Negative for headaches, focal weakness or numbness.   ____________________________________________   PHYSICAL EXAM:  VITAL SIGNS: ED Triage Vitals  Enc Vitals Group     BP 10/04/19 1523 130/85     Pulse Rate 10/04/19 1523 79     Resp 10/04/19 1523 18     Temp 10/04/19 1523 99 F (37.2 C)     Temp Source 10/04/19 1523 Oral     SpO2 10/04/19 1523 99 %  Weight 10/04/19 1514 73.9 kg (163 lb)     Height 10/04/19 1514 1.651 m (5\' 5" )     Head Circumference --      Peak Flow --      Pain Score 10/04/19 1514 9     Pain Loc --      Pain Edu? --      Excl. in [place]? --     Constitutional: Alert and oriented.  Eyes: Conjunctivae are normal.  Mouth/Throat: Patient is wearing a mask. Neck: No stridor.  No meningeal signs.   Cardiovascular: Normal rate, regular rhythm. Good peripheral circulation. Grossly normal heart sounds. Respiratory: Normal respiratory  effort.  No retractions. Gastrointestinal: Soft and nontender. No distention.  Musculoskeletal: No lower extremity tenderness nor edema. No gross deformities of extremities. Neurologic:  Normal speech and language. No gross focal neurologic deficits are appreciated.  Skin:  Skin is warm, dry and intact. Psychiatric: Mood and affect are normal. Speech and behavior are normal.  ____________________________________________   LABS (all labs ordered are listed, but only abnormal results are displayed)  Labs Reviewed  BASIC METABOLIC PANEL - Abnormal; Notable for the following components:      Result Value   Glucose, Bld 103 (*)    All other components within normal limits  CBC  TROPONIN I (HIGH SENSITIVITY)  TROPONIN I (HIGH SENSITIVITY)   ____________________________________________  EKG  ED ECG REPORT I, [place] N Amyrah Pinkhasov, the attending physician, personally viewed and interpreted this ECG.   Date: 10/04/2019  EKG Time: 3:18 PM  Rate: 79  Rhythm: Normal sinus rhythm  Axis: Normal  Intervals: Normal  ST&T Change: None  ____________________________________________  RADIOLOGY I, [place] N Sherrel Shafer, personally viewed and evaluated these images (plain radiographs) as part of my medical decision making, as well as reviewing the written report by the radiologist.  ED MD interpretation: No acute cardiopulmonary abnormality noted on chest x-ray per radiologist.  Official radiology report(s): DG Chest 2 View  Result Date: 10/04/2019 CLINICAL DATA:  Chest pain and shortness of breath for 2-3 weeks, history of COVID-19 EXAM: CHEST - 2 VIEW COMPARISON:  Radiograph 09/29/2019 FINDINGS: No consolidation, features of edema, pneumothorax, or effusion. Pulmonary vascularity is normally distributed. The cardiomediastinal contours are unremarkable. No acute osseous or soft tissue abnormality. Bilateral breast prosthesis and cervical fusion hardware is noted. Degenerative changes are present in  the imaged spine and shoulders. Mild levocurvature of the spine is similar to prior. IMPRESSION: No acute cardiopulmonary abnormality. Electronically Signed   By: Lovena Le M.D.   On: 10/04/2019 15:54    ___________________________________________ Procedures   ____________________________________________   INITIAL IMPRESSION / MDM / [place] / ED COURSE  As part of my medical decision making, I reviewed the following data within the electronic MEDICAL RECORD NUMBER   64 year old female presenting with above-stated history and physical exam consistent with pleurisy or bronchospasm secondary to cigarette smoke as her irritant.     ____________________________________________  FINAL CLINICAL IMPRESSION(S) / ED DIAGNOSES  Final diagnoses:  Pleurisy  Cough due to bronchospasm     MEDICATIONS GIVEN DURING THIS VISIT:  Medications - No data to display   ED Discharge Orders    None      *Please note:  [name] was evaluated in Emergency Department on 10/04/2019 for the symptoms described in the history of present illness. She was evaluated in the context of the global COVID-19 pandemic, which necessitated consideration that the patient might be at risk for infection with  the SARS-CoV-2 virus that causes COVID-19. Institutional protocols and algorithms that pertain to the evaluation of patients at risk for COVID-19 are in a state of rapid change based on information released by regulatory bodies including the CDC and federal and state organizations. These policies and algorithms were followed during the patient's care in the ED.  Some ED evaluations and interventions may be delayed as a result of limited staffing during the pandemic.*  Note:  This document was prepared using Dragon voice recognition software and may include unintentional dictation errors.   Darci Current, MD 10/06/19 [phone]

## 2019-10-04 NOTE — ED Triage Notes (Signed)
Pt reports pressure like CP and SOB for the last 2-3 weeks. Pt states she was seen for the same here last week and no better. Pt reports thinks that her neighbors smoke may be causing it as well

## 2019-10-28 ENCOUNTER — Encounter: Payer: Self-pay | Admitting: Internal Medicine

## 2019-10-28 ENCOUNTER — Ambulatory Visit (INDEPENDENT_AMBULATORY_CARE_PROVIDER_SITE_OTHER): Payer: Medicare Other | Admitting: Internal Medicine

## 2019-10-28 ENCOUNTER — Other Ambulatory Visit: Payer: Self-pay

## 2019-10-28 VITALS — Ht 65.0 in | Wt 161.0 lb

## 2019-10-28 DIAGNOSIS — Z981 Arthrodesis status: Secondary | ICD-10-CM | POA: Insufficient documentation

## 2019-10-28 DIAGNOSIS — R748 Abnormal levels of other serum enzymes: Secondary | ICD-10-CM

## 2019-10-28 DIAGNOSIS — M810 Age-related osteoporosis without current pathological fracture: Secondary | ICD-10-CM | POA: Insufficient documentation

## 2019-10-28 DIAGNOSIS — Z1211 Encounter for screening for malignant neoplasm of colon: Secondary | ICD-10-CM

## 2019-10-28 DIAGNOSIS — R0602 Shortness of breath: Secondary | ICD-10-CM

## 2019-10-28 DIAGNOSIS — M5441 Lumbago with sciatica, right side: Secondary | ICD-10-CM

## 2019-10-28 DIAGNOSIS — M25551 Pain in right hip: Secondary | ICD-10-CM | POA: Insufficient documentation

## 2019-10-28 DIAGNOSIS — F329 Major depressive disorder, single episode, unspecified: Secondary | ICD-10-CM

## 2019-10-28 DIAGNOSIS — E559 Vitamin D deficiency, unspecified: Secondary | ICD-10-CM

## 2019-10-28 DIAGNOSIS — Z86010 Personal history of colonic polyps: Secondary | ICD-10-CM | POA: Insufficient documentation

## 2019-10-28 DIAGNOSIS — E039 Hypothyroidism, unspecified: Secondary | ICD-10-CM

## 2019-10-28 DIAGNOSIS — N644 Mastodynia: Secondary | ICD-10-CM | POA: Insufficient documentation

## 2019-10-28 DIAGNOSIS — I10 Essential (primary) hypertension: Secondary | ICD-10-CM | POA: Insufficient documentation

## 2019-10-28 DIAGNOSIS — Z13818 Encounter for screening for other digestive system disorders: Secondary | ICD-10-CM

## 2019-10-28 DIAGNOSIS — R739 Hyperglycemia, unspecified: Secondary | ICD-10-CM

## 2019-10-28 DIAGNOSIS — F32A Depression, unspecified: Secondary | ICD-10-CM

## 2019-10-28 DIAGNOSIS — Z124 Encounter for screening for malignant neoplasm of cervix: Secondary | ICD-10-CM

## 2019-10-28 DIAGNOSIS — F339 Major depressive disorder, recurrent, unspecified: Secondary | ICD-10-CM | POA: Insufficient documentation

## 2019-10-28 DIAGNOSIS — U071 COVID-19: Secondary | ICD-10-CM

## 2019-10-28 DIAGNOSIS — Z9882 Breast implant status: Secondary | ICD-10-CM | POA: Insufficient documentation

## 2019-10-28 DIAGNOSIS — K921 Melena: Secondary | ICD-10-CM

## 2019-10-28 DIAGNOSIS — G8929 Other chronic pain: Secondary | ICD-10-CM | POA: Insufficient documentation

## 2019-10-28 MED ORDER — LEVOTHYROXINE SODIUM 150 MCG PO TABS: 150.0000 ug | ORAL_TABLET | Freq: Every day | ORAL | 3 refills | Status: DC

## 2019-10-28 MED ORDER — MELOXICAM 7.5 MG PO TABS: 7.5000 mg | ORAL_TABLET | Freq: Two times a day (BID) | ORAL | 2 refills | Status: DC | PRN

## 2019-10-28 MED ORDER — HYDROCHLOROTHIAZIDE 12.5 MG PO TABS: 12.5000 mg | ORAL_TABLET | Freq: Every day | ORAL | 3 refills | Status: DC
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
## 2019-10-28 NOTE — Patient Instructions (Addendum)
[facility]  [address] Dr. Lorina Rabon [place] [phone]  Please contact [facility] and get them to fax to me [phone] Dr. Olivia [name] McLean-Scocuzza  All vaccines  Pap smear w/in the 5 years  Mammogram  DEXA Colonoscopy if you can find  Back surgery info   Dentists in [place]  1. Dr. Wilkie Aye [phone] 2. [place] [phone]  If you are interested in the covid 19 vaccine you have to wait 90 days    COVID-19 Vaccine Information can be found at: ShippingScam.co.uk For questions related to vaccine distribution or appointments, please email [email or web address] or call [phone].   COVID-19 Frequently Asked Questions COVID-19 (coronavirus disease) is an infection that is caused by a large family of viruses. Some viruses cause illness in people and others cause illness in animals like camels, cats, and bats. In some cases, the viruses that cause illness in animals can spread to humans. Where did the coronavirus come from? In December 2019, [place] told the [facility]) of several cases of lung disease (human respiratory illness). These cases were linked to an open seafood and livestock market in the city of [place]. The link to the seafood and livestock market suggests that the virus may have spread from animals to humans. However, since that first outbreak in December, the virus has also been shown to spread from person to person. What is the name of the disease and the virus? Disease name Early on, this disease was called novel coronavirus. This is because scientists determined that the disease was caused by a new (novel) respiratory virus. The World Health Organization [facility]) has now named the disease COVID-19, or coronavirus disease. Virus name The virus that causes the disease is called severe acute respiratory syndrome coronavirus 2 (SARS-CoV-2). More information on  disease and virus naming World Health Organization [facility]): www.who.int/emergencies/diseases/novel-coronavirus-2019/technical-guidance/naming-the-coronavirus-disease-(covid-2019)-and-the-virus-that-causes-it Who is at risk for complications from coronavirus disease? Some people may be at higher risk for complications from coronavirus disease. This includes older adults and people who have chronic diseases, such as heart disease, diabetes, and lung disease. If you are at higher risk for complications, take these extra precautions:  Stay home as much as possible.  Avoid social gatherings and travel.  Avoid close contact with others. Stay at least 6 ft (2 m) away from others, if possible.  Wash your hands often with soap and water for at least 20 seconds.  Avoid touching your face, mouth, nose, or eyes.  Keep supplies on hand at home, such as food, medicine, and cleaning supplies.  If you must go out in public, wear a cloth face covering or face mask. Make sure your mask covers your nose and mouth. How does coronavirus disease spread? The virus that causes coronavirus disease spreads easily from person to person (is contagious). You may catch the virus by:  Breathing in droplets from an infected person. Droplets can be spread by a person breathing, speaking, singing, coughing, or sneezing.  Touching something, like a table or a doorknob, that was exposed to the virus (contaminated) and then touching your mouth, nose, or eyes. Can I get the virus from touching surfaces or objects? There is still a lot that we do not know about the virus that causes coronavirus disease. Scientists are basing a lot of information on what they know about similar viruses, such as:  Viruses cannot generally survive on surfaces for long. They need a human body (host) to survive.  It is  more likely that the virus is spread by close contact with people who are sick (direct contact), such as through: ? Shaking hands  or hugging. ? Breathing in respiratory droplets that travel through the air. Droplets can be spread by a person breathing, speaking, singing, coughing, or sneezing.  It is less likely that the virus is spread when a person touches a surface or object that has the virus on it (indirect contact). The virus may be able to enter the body if the person touches a surface or object and then touches his or her face, eyes, nose, or mouth. Can a person spread the virus without having symptoms of the disease? It may be possible for the virus to spread before a person has symptoms of the disease, but this is most likely not the main way the virus is spreading. It is more likely for the virus to spread by being in close contact with people who are sick and breathing in the respiratory droplets spread by a person breathing, speaking, singing, coughing, or sneezing. What are the symptoms of coronavirus disease? Symptoms vary from person to person and can range from mild to severe. Symptoms may include:  Fever or chills.  Cough.  Difficulty breathing or feeling short of breath.  Headaches, body aches, or muscle aches.  Runny or stuffy (congested) nose.  Sore throat.  New loss of taste or smell.  Nausea, vomiting, or diarrhea. These symptoms can appear anywhere from 2 to 14 days after you have been exposed to the virus. Some people may not have any symptoms. If you develop symptoms, call your health care provider. People with severe symptoms may need hospital care. Should I be tested for this virus? Your health care provider will decide whether to test you based on your symptoms, history of exposure, and your risk factors. How does a health care provider test for this virus? Health care providers will collect samples to send for testing. Samples may include:  Taking a swab of fluid from the back of your nose and throat, your nose, or your throat.  Taking fluid from the lungs by having you cough up  mucus (sputum) into a sterile cup.  Taking a blood sample. Is there a treatment or vaccine for this virus? Currently, there is no vaccine to prevent coronavirus disease. Also, there are no medicines like antibiotics or antivirals to treat the virus. A person who becomes sick is given supportive care, which means rest and fluids. A person may also relieve his or her symptoms by using over-the-counter medicines that treat sneezing, coughing, and runny nose. These are the same medicines that a person takes for the common cold. If you develop symptoms, call your health care provider. People with severe symptoms may need hospital care. What can I do to protect myself and my family from this virus?     You can protect yourself and your family by taking the same actions that you would take to prevent the spread of other viruses. Take the following actions:  Wash your hands often with soap and water for at least 20 seconds. If soap and water are not available, use alcohol-based hand sanitizer.  Avoid touching your face, mouth, nose, or eyes.  Cough or sneeze into a tissue, sleeve, or elbow. Do not cough or sneeze into your hand or the air. ? If you cough or sneeze into a tissue, throw it away immediately and wash your hands.  Disinfect objects and surfaces that you frequently touch  every day.  Stay away from people who are sick.  Avoid going out in public, follow guidance from your state and local health authorities.  Avoid crowded indoor spaces. Stay at least 6 ft (2 m) away from others.  If you must go out in public, wear a cloth face covering or face mask. Make sure your mask covers your nose and mouth.  Stay home if you are sick, except to get medical care. Call your health care provider before you get medical care. Your health care provider will tell you how long to stay home.  Make sure your vaccines are up to date. Ask your health care provider what vaccines you need. What should I do  if I need to travel? Follow travel recommendations from your local health authority, the CDC, and WHO. Travel information and advice  Centers for Disease Control and Prevention (CDC): BodyEditor.hu  World Health Organization [facility]): ThirdIncome.ca Know the risks and take action to protect your health  You are at higher risk of getting coronavirus disease if you are traveling to areas with an outbreak or if you are exposed to travelers from areas with an outbreak.  Wash your hands often and practice good hygiene to lower the risk of catching or spreading the virus. What should I do if I am sick? General instructions to stop the spread of infection  Wash your hands often with soap and water for at least 20 seconds. If soap and water are not available, use alcohol-based hand sanitizer.  Cough or sneeze into a tissue, sleeve, or elbow. Do not cough or sneeze into your hand or the air.  If you cough or sneeze into a tissue, throw it away immediately and wash your hands.  Stay home unless you must get medical care. Call your health care provider or local health authority before you get medical care.  Avoid public areas. Do not take public transportation, if possible.  If you can, wear a mask if you must go out of the house or if you are in close contact with someone who is not sick. Make sure your mask covers your nose and mouth. Keep your home clean  Disinfect objects and surfaces that are frequently touched every day. This may include: ? Counters and tables. ? Doorknobs and light switches. ? Sinks and faucets. ? Electronics such as phones, remote controls, keyboards, computers, and tablets.  Wash dishes in hot, soapy water or use a dishwasher. Air-dry your dishes.  Wash laundry in hot water. Prevent infecting other household members  Let healthy household members care for children and  pets, if possible. If you have to care for children or pets, wash your hands often and wear a mask.  Sleep in a different bedroom or bed, if possible.  Do not share personal items, such as razors, toothbrushes, deodorant, combs, brushes, towels, and washcloths. Where to find more information Centers for Disease Control and Prevention (CDC)  Information and news updates: https://www.butler-gonzalez.com/ World Health Organization [facility])  Information and news updates: MissExecutive.com.ee  Coronavirus health topic: https://www.castaneda.info/  Questions and answers on COVID-19: OpportunityDebt.at  Global tracker: who.sprinklr.com American Academy of Pediatrics (AAP)  Information for families: www.healthychildren.org/English/health-issues/conditions/chest-lungs/Pages/2019-Novel-Coronavirus.aspx The coronavirus situation is changing rapidly. Check your local health authority website or the CDC and [facility] websites for updates and news. When should I contact a health care provider?  Contact your health care provider if you have symptoms of an infection, such as fever or cough, and you: ? Have been near anyone  who is known to have coronavirus disease. ? Have come into contact with a person who is suspected to have coronavirus disease. ? Have traveled to an area where there is an outbreak of COVID-19. When should I get emergency medical care?  Get help right away by calling your local emergency services (911 in the [place].S[place] if you have: ? Trouble breathing. ? Pain or pressure in your chest. ? Confusion. ? Blue-tinged lips and fingernails. ? Difficulty waking from sleep. ? Symptoms that get worse. Let the emergency medical personnel know if you think you have coronavirus disease. Summary  A new respiratory virus is spreading from person to person and causing COVID-19 (coronavirus disease).  The virus that causes  COVID-19 appears to spread easily. It spreads from one person to another through droplets from breathing, speaking, singing, coughing, or sneezing.  Older adults and those with chronic diseases are at higher risk of disease. If you are at higher risk for complications, take extra precautions.  There is currently no vaccine to prevent coronavirus disease. There are no medicines, such as antibiotics or antivirals, to treat the virus.  You can protect yourself and your family by washing your hands often, avoiding touching your face, and covering your coughs and sneezes. This information is not intended to replace advice given to you by your health care provider. Make sure you discuss any questions you have with your health care provider. Document Revised: 07/08/2019 Document Reviewed: 01/04/2019 Elsevier Patient Education  [place].  Osteoporosis  Osteoporosis is thinning and loss of density in your bones. Osteoporosis makes bones more brittle and fragile and more likely to break (fracture). Over time, osteoporosis can cause your bones to become so weak that they fracture after a minor fall. Bones in the hip, wrist, and spine are most likely to fracture due to osteoporosis. What are the causes? The exact cause of this condition is not known. What increases the risk? You may be at greater risk for osteoporosis if you:  Have a family history of the condition.  Have poor nutrition.  Use steroid medicines, such as prednisone.  Are female.  Are age 42 or older.  Smoke or have a history of smoking.  Are not physically active (are sedentary).  Are white (Caucasian) or of Asian descent.  Have a small body frame.  Take certain medicines, such as antiseizure medicines. What are the signs or symptoms? A fracture might be the first sign of osteoporosis, especially if the fracture results from a fall or injury that usually would not cause a bone to break. Other signs and symptoms  include:  Pain in the neck or low back.  Stooped posture.  Loss of height. How is this diagnosed? This condition may be diagnosed based on:  Your medical history.  A physical exam.  A bone mineral density test, also called a DXA or DEXA test (dual-energy X-ray absorptiometry test). This test uses X-rays to measure the amount of minerals in your bones. How is this treated? The goal of treatment is to strengthen your bones and lower your risk for a fracture. Treatment may involve:  Making lifestyle changes, such as: ? Including foods with more calcium and vitamin D in your diet. ? Doing weight-bearing and muscle-strengthening exercises. ? Stopping tobacco use. ? Limiting alcohol intake.  Taking medicine to slow the process of bone loss or to increase bone density.  Taking daily supplements of calcium and vitamin D.  Taking hormone replacement medicines, such as estrogen for  women and testosterone for men.  Monitoring your levels of calcium and vitamin D. Follow these instructions at home:  Activity  Exercise as told by your health care provider. Ask your health care provider what exercises and activities are safe for you. You should do: ? Exercises that make you work against gravity (weight-bearing exercises), such as tai chi, yoga, or walking. ? Exercises to strengthen muscles, such as lifting weights. Lifestyle  Limit alcohol intake to no more than 1 drink a day for nonpregnant women and 2 drinks a day for men. One drink equals 12 oz of beer, 5 oz of wine, or 1 oz of hard liquor.  Do not use any products that contain nicotine or tobacco, such as cigarettes and e-cigarettes. If you need help quitting, ask your health care provider. Preventing falls  Use devices to help you move around (mobility aids) as needed, such as canes, walkers, scooters, or crutches.  Keep rooms well-lit and clutter-free.  Remove tripping hazards from walkways, including cords and throw  rugs.  Install grab bars in bathrooms and safety rails on stairs.  Use rubber mats in the bathroom and other areas that are often wet or slippery.  Wear closed-toe shoes that fit well and support your feet. Wear shoes that have rubber soles or low heels.  Review your medicines with your health care provider. Some medicines can cause dizziness or changes in blood pressure, which can increase your risk of falling. General instructions  Include calcium and vitamin D in your diet. Calcium is important for bone health, and vitamin D helps your body to absorb calcium. Good sources of calcium and vitamin D include: ? Certain fatty fish, such as salmon and tuna. ? Products that have calcium and vitamin D added to them (fortified products), such as fortified cereals. ? Egg yolks. ? Cheese. ? Liver.  Take over-the-counter and prescription medicines only as told by your health care provider.  Keep all follow-up visits as told by your health care provider. This is important. Contact a health care provider if:  You have never been screened for osteoporosis and you are: ? A woman who is age 20 or older. ? A man who is age 84 or older. Get help right away if:  You fall or injure yourself. Summary  Osteoporosis is thinning and loss of density in your bones. This makes bones more brittle and fragile and more likely to break (fracture),even with minor falls.  The goal of treatment is to strengthen your bones and reduce your risk for a fracture.  Include calcium and vitamin D in your diet. Calcium is important for bone health, and vitamin D helps your body to absorb calcium.  Talk with your health care provider about screening for osteoporosis if you are a woman who is age 67 or older, or a man who is age 60 or older. This information is not intended to replace advice given to you by your health care provider. Make sure you discuss any questions you have with your health care provider. Document  Revised: 08/21/2017 Document Reviewed: 07/03/2017 Elsevier Patient Education  2020 [employer].

## 2019-11-02 ENCOUNTER — Telehealth: Payer: Self-pay | Admitting: Internal Medicine

## 2019-11-02 NOTE — Telephone Encounter (Signed)
lvm to rtc.  Dr. French Ana ordered a MRI for her. It is scheduled for Wednesday Feb. 17 at [facility]. She will need to arrive by 6:30 pm for check in and her appt will begin at 7. If she needs to reschedule she can call [phone] option 3, option 2

## 2019-11-09 ENCOUNTER — Ambulatory Visit
Admission: RE | Admit: 2019-11-09 | Discharge: 2019-11-09 | Disposition: A | Discharge status: Home / Self Care | Payer: Medicare Other | Source: Ambulatory Visit | Attending: Internal Medicine | Admitting: Internal Medicine

## 2019-11-09 ENCOUNTER — Other Ambulatory Visit: Payer: Self-pay

## 2019-11-09 DIAGNOSIS — M545 Low back pain: Secondary | ICD-10-CM | POA: Diagnosis not present

## 2019-11-09 DIAGNOSIS — Z981 Arthrodesis status: Secondary | ICD-10-CM

## 2019-11-09 DIAGNOSIS — G8929 Other chronic pain: Secondary | ICD-10-CM | POA: Diagnosis not present

## 2019-11-09 DIAGNOSIS — M25551 Pain in right hip: Secondary | ICD-10-CM | POA: Insufficient documentation

## 2019-11-09 DIAGNOSIS — M1611 Unilateral primary osteoarthritis, right hip: Secondary | ICD-10-CM | POA: Diagnosis not present

## 2019-11-09 DIAGNOSIS — M5441 Lumbago with sciatica, right side: Secondary | ICD-10-CM | POA: Insufficient documentation

## 2019-11-14 ENCOUNTER — Other Ambulatory Visit: Payer: Self-pay | Admitting: Internal Medicine

## 2019-11-14 DIAGNOSIS — Z9889 Other specified postprocedural states: Secondary | ICD-10-CM

## 2019-11-14 DIAGNOSIS — M25559 Pain in unspecified hip: Secondary | ICD-10-CM

## 2019-11-14 DIAGNOSIS — M5416 Radiculopathy, lumbar region: Secondary | ICD-10-CM

## 2019-11-14 DIAGNOSIS — M549 Dorsalgia, unspecified: Secondary | ICD-10-CM

## 2019-11-16 ENCOUNTER — Other Ambulatory Visit: Payer: Medicare Other

## 2019-11-16 ENCOUNTER — Telehealth: Payer: Self-pay | Admitting: Internal Medicine

## 2019-11-16 NOTE — Telephone Encounter (Signed)
Pt called in and was asking if someone called her from here. I told her that I didn't see anything in the notes that anyone called. She then says that she was scheduled for a mammogram in [place] and with her transportation issues she had to cancel. They told her to try to schedule in [place]. She wanted to know if she needs a new order sent to somewhere in [place]. I had to place her on hold and she hung up. I tried calling her back to see where she wanted to go but she didn't answer and her mailbox was full.

## 2019-11-18 ENCOUNTER — Institutional Professional Consult (permissible substitution): Payer: Medicare Other | Admitting: Pulmonary Disease

## 2019-11-21 ENCOUNTER — Other Ambulatory Visit: Payer: Medicare Other

## 2019-11-21 DIAGNOSIS — Z03818 Encounter for observation for suspected exposure to other biological agents ruled out: Secondary | ICD-10-CM | POA: Diagnosis not present

## 2019-11-29 DIAGNOSIS — R200 Anesthesia of skin: Secondary | ICD-10-CM | POA: Diagnosis not present

## 2019-11-29 DIAGNOSIS — M25551 Pain in right hip: Secondary | ICD-10-CM | POA: Diagnosis not present

## 2019-11-29 DIAGNOSIS — G8929 Other chronic pain: Secondary | ICD-10-CM | POA: Diagnosis not present

## 2019-11-30 ENCOUNTER — Other Ambulatory Visit: Payer: Self-pay

## 2019-11-30 ENCOUNTER — Other Ambulatory Visit (INDEPENDENT_AMBULATORY_CARE_PROVIDER_SITE_OTHER): Payer: Medicare Other

## 2019-11-30 DIAGNOSIS — I10 Essential (primary) hypertension: Secondary | ICD-10-CM | POA: Diagnosis not present

## 2019-11-30 DIAGNOSIS — E039 Hypothyroidism, unspecified: Secondary | ICD-10-CM

## 2019-11-30 DIAGNOSIS — Z13818 Encounter for screening for other digestive system disorders: Secondary | ICD-10-CM

## 2019-11-30 DIAGNOSIS — R739 Hyperglycemia, unspecified: Secondary | ICD-10-CM | POA: Diagnosis not present

## 2019-11-30 DIAGNOSIS — R748 Abnormal levels of other serum enzymes: Secondary | ICD-10-CM

## 2019-11-30 DIAGNOSIS — E559 Vitamin D deficiency, unspecified: Secondary | ICD-10-CM | POA: Diagnosis not present

## 2019-11-30 LAB — HEPATIC FUNCTION PANEL
ALT: 23 U/L (ref 0–35)
AST: 24 U/L (ref 0–37)
Albumin: 4.2 g/dL (ref 3.5–5.2)
Alkaline Phosphatase: 60 U/L (ref 39–117)
Bilirubin, Direct: 0.1 mg/dL (ref 0.0–0.3)
Total Bilirubin: 0.6 mg/dL (ref 0.2–1.2)
Total Protein: 7.2 g/dL (ref 6.0–8.3)

## 2019-11-30 LAB — VITAMIN D 25 HYDROXY (VIT D DEFICIENCY, FRACTURES): VITD: 52.95 ng/mL (ref 30.00–100.00)

## 2019-11-30 LAB — LIPID PANEL
Cholesterol: 179 mg/dL (ref 0–200)
HDL: 61.3 mg/dL (ref >39.00)
LDL Cholesterol: 97 mg/dL (ref 0–99)
NonHDL: 117.37
Total CHOL/HDL Ratio: 3
Triglycerides: 101 mg/dL (ref 0.0–149.0)
VLDL: 20.2 mg/dL (ref 0.0–40.0)

## 2019-11-30 LAB — TSH: TSH: 5.4 u[IU]/mL — ABNORMAL HIGH (ref 0.35–4.50)

## 2019-11-30 LAB — HEMOGLOBIN A1C: Hgb A1c MFr Bld: 5.6 % (ref 4.6–6.5)

## 2019-12-01 LAB — HEPATITIS B SURFACE ANTIGEN: Hepatitis B Surface Ag: NONREACTIVE

## 2019-12-01 LAB — HEPATITIS C ANTIBODY
Hepatitis C Ab: NONREACTIVE
SIGNAL TO CUT-OFF: 0.04 (ref <1.00)

## 2019-12-01 LAB — HEPATITIS A ANTIBODY, TOTAL: Hepatitis A AB,Total: REACTIVE — AB

## 2019-12-01 LAB — HEPATITIS B SURFACE ANTIBODY, QUANTITATIVE: Hep B S AB Quant (Post): <5 m[IU]/mL — ABNORMAL LOW (ref >=10)

## 2019-12-03 ENCOUNTER — Encounter: Payer: Self-pay | Admitting: Internal Medicine

## 2019-12-05 ENCOUNTER — Telehealth: Payer: Self-pay | Admitting: Internal Medicine

## 2019-12-05 ENCOUNTER — Other Ambulatory Visit: Payer: Self-pay | Admitting: Internal Medicine

## 2019-12-05 DIAGNOSIS — E039 Hypothyroidism, unspecified: Secondary | ICD-10-CM

## 2019-12-05 MED ORDER — LEVOTHYROXINE SODIUM 175 MCG PO TABS: 175.0000 ug | ORAL_TABLET | Freq: Every day | ORAL | 3 refills | Status: DC

## 2019-12-05 NOTE — Telephone Encounter (Signed)
Patient informed and verbalized understanding.  Patient was confused and was wondering if this meant she had Hep A or B. Informed her that no she is showing protection against Hep A and the vaccination should give her protection against Hep B. Patient verbalized understanding   Patient has not missed any doses of her levothyroxine and is agreeable to increase to 175 mcg.  Patient scheduled for repeat labs 02/06/20 at 2:00pm.   Please advise on Covid Vaccine.

## 2019-12-05 NOTE — Telephone Encounter (Signed)
Pt is calling in and saying she would like a call back to discuss her blood work and also she wants to know if she is having trouble with her breathing since having covid in October (this is when someone would smoke around her) would she qualify for the covid vaccine?   I scheduled her for Hep B vaccine 12/06/19 @ 3pm.

## 2019-12-05 NOTE — Telephone Encounter (Signed)
Covid 19 vaccine has to be 90 days after covid + and she had covid  If she wants this give resources to look into getting this own her own also [facility] COVID-19 Vaccine Information can be found at: [email or web address] For questions related to vaccine distribution or appointments, please email [email or web address] or call [phone].     Also other vaccines other than covid has to space out by 1 month   TMS

## 2019-12-05 NOTE — Telephone Encounter (Signed)
-----   Message from Bevelyn Buckles, MD sent at 12/05/2019  7:00 AM EDT ----- Consider hep B vaccine new x 2 doses in office call the office to schedule Hep C negative  Protected against hep A  Vitamin D normal   Thyroid lab elevated  -is she missing any doses of levothyroxine?  -if not we need to increase her dose to 175 ok with this?  -will order repeat TSH in 2 months schedule for pt?   Liver enzymes normal  Cholesterol normal  No prediabetes

## 2019-12-06 ENCOUNTER — Other Ambulatory Visit: Payer: Self-pay

## 2019-12-06 ENCOUNTER — Ambulatory Visit (INDEPENDENT_AMBULATORY_CARE_PROVIDER_SITE_OTHER): Payer: Medicare Other

## 2019-12-06 DIAGNOSIS — Z23 Encounter for immunization: Secondary | ICD-10-CM | POA: Diagnosis not present

## 2019-12-06 NOTE — Progress Notes (Signed)
Patient came in for first doe of Hep-B vaccine in left deltoid, IM. Patient tolerated well.

## 2019-12-06 NOTE — Telephone Encounter (Signed)
Spoke with patient see result note and phone encounter.

## 2019-12-06 NOTE — Telephone Encounter (Signed)
Patient informed and verbalized understanding.  Given the number to call about her vaccine, she knows to wait after getting her Hep B vaccine 1 month.   Patient inquired about getting a colonoscopy. She sees GI for new patient appointment 12/14/19, she will discuss this with them.   Patient would like your recommendation and referral to a OBGYN in the area. Please advise

## 2019-12-07 NOTE — Telephone Encounter (Signed)
Referred Dr. Ila Mcgill [facility]

## 2019-12-07 NOTE — Addendum Note (Signed)
Addended by: Quentin Ore on: 12/07/2019 02:04 PM   Modules accepted: Orders

## 2019-12-08 NOTE — Telephone Encounter (Signed)
Patient informed and verbalized understanding.  She will call back after getting her Covid vaccines to schedule shingles shot

## 2019-12-08 NOTE — Telephone Encounter (Signed)
Patient informed and verbalized understanding.   Patient wants to know if she should get the shingles shot.

## 2019-12-08 NOTE — Telephone Encounter (Signed)
Consider shingrix in future x 2 doses here  If getting covid vaccine will need to wait 1 month in between vaccines   TMS

## 2019-12-14 ENCOUNTER — Ambulatory Visit: Payer: Medicare Other | Admitting: Gastroenterology

## 2019-12-19 ENCOUNTER — Institutional Professional Consult (permissible substitution): Payer: Medicare Other | Admitting: Pulmonary Disease

## 2019-12-20 ENCOUNTER — Other Ambulatory Visit: Payer: Self-pay

## 2019-12-22 ENCOUNTER — Ambulatory Visit (INDEPENDENT_AMBULATORY_CARE_PROVIDER_SITE_OTHER): Payer: Medicare Other | Admitting: Internal Medicine

## 2019-12-22 ENCOUNTER — Other Ambulatory Visit: Payer: Self-pay

## 2019-12-22 ENCOUNTER — Encounter: Payer: Self-pay | Admitting: Internal Medicine

## 2019-12-22 VITALS — BP 118/78 | HR 77 | Temp 96.8°F | Ht 65.0 in | Wt 170.8 lb

## 2019-12-22 DIAGNOSIS — E039 Hypothyroidism, unspecified: Secondary | ICD-10-CM | POA: Diagnosis not present

## 2019-12-22 DIAGNOSIS — N644 Mastodynia: Secondary | ICD-10-CM

## 2019-12-22 DIAGNOSIS — R1310 Dysphagia, unspecified: Secondary | ICD-10-CM

## 2019-12-22 DIAGNOSIS — R946 Abnormal results of thyroid function studies: Secondary | ICD-10-CM | POA: Diagnosis not present

## 2019-12-22 DIAGNOSIS — I10 Essential (primary) hypertension: Secondary | ICD-10-CM | POA: Diagnosis not present

## 2019-12-22 MED ORDER — HYDROCHLOROTHIAZIDE 12.5 MG PO TABS: 12.5000 mg | ORAL_TABLET | Freq: Every day | ORAL | 3 refills | Status: DC

## 2019-12-22 NOTE — Patient Instructions (Addendum)
Need copy of  Vaccines Mammogram Pap smear Bone density  Colonoscopy   Dr. Norma Fredrickson  [address]  [id number]  [place] [address]   [phone]  [phone]       Hours of Operation       [facility]  Open 8-11 am on Saturdays    Dysphagia  Dysphagia is trouble swallowing. This condition occurs when solids and liquids stick in a person's throat on the way down to the stomach, or when food takes longer to get to the stomach than usual. You may have problems swallowing food, liquids, or both. You may also have pain while trying to swallow. It may take you more time and effort to swallow something. What are the causes? This condition may be caused by:  Muscle problems. They may make it difficult for you to move food and liquids through the esophagus, which is the tube that connects your mouth to your stomach.  Blockages. You may have ulcers, scar tissue, or inflammation that blocks the normal passage of food and liquids. Causes of these problems include: ? Acid reflux from your stomach into your esophagus (gastroesophageal reflux). ? Infections. ? Radiation treatment for cancer. ? Medicines taken without enough fluids to wash them down into your stomach.  Stroke. This can affect the nerves and make it difficult to swallow.  Nerve problems. These prevent signals from being sent to the muscles of your esophagus to squeeze (contract) and move what you swallow down to your stomach.  Globus pharyngeus. This is a common problem that involves a feeling like something is stuck in your throat or a sense of trouble with swallowing, even though nothing is wrong with the swallowing passages.  Certain conditions, such as cerebral palsy or Parkinson's disease. What are the signs or symptoms? Common symptoms of this condition include:  A feeling that solids or liquids are stuck in your throat on the way down to the stomach.  Pain while swallowing.  Coughing or  gagging while trying to swallow. Other symptoms include:  Food moving back from your stomach to your mouth (regurgitation).  Noises coming from your throat.  Chest discomfort with swallowing.  A feeling of fullness when swallowing.  Drooling, especially when the throat is blocked.  Heartburn. How is this diagnosed? This condition may be diagnosed by:  Barium X-ray. In this test, you will swallow a white liquid that sticks to the inside of your esophagus. X-ray images are then taken.  Endoscopy. In this test, a flexible telescope is inserted down your throat to look at your esophagus and your stomach.  CT scans and an MRI. How is this treated? Treatment for dysphagia depends on the cause of this condition, such as:  If the dysphagia is caused by acid reflux or infection, medicines may be used. They may include antibiotics and heartburn medicines.  If the dysphagia is caused by problems with the muscles, swallowing therapy may be used to help you strengthen your swallowing muscles. You may have to do specific exercises to strengthen the muscles or stretch them.  If the dysphagia is caused by a blockage or mass, procedures to remove the blockage may be done. You may need surgery and a feeding tube. You may need to make diet changes. Ask your health care provider for specific instructions. Follow these instructions at home: Medicines  Take over-the-counter and prescription medicines only as told by your health care provider.  If you were prescribed an antibiotic medicine, take it as  told by your health care provider. Do not stop taking the antibiotic even if you start to feel better. Eating and drinking   Follow any diet changes as told by your health care provider.  Work with a diet and nutrition specialist (dietitian) to create an eating plan that will help you get the nutrients you need in order to stay healthy.  Eat soft foods that are easier to swallow.  Cut your food  into small pieces and eat slowly. Take small bites.  Eat and drink only when you are sitting upright.  Do not drink alcohol or caffeine. If you need help quitting, ask your health care provider. General instructions  Check your weight every day to make sure you are not losing weight.  Do not use any products that contain nicotine or tobacco, such as cigarettes, e-cigarettes, and chewing tobacco. If you need help quitting, ask your health care provider.  Keep all follow-up visits as told by your health care provider. This is important. Contact a health care provider if you:  Lose weight because you cannot swallow.  Cough when you drink liquids.  Cough up partially digested food. Get help right away if you:  Cannot swallow your saliva.  Have shortness of breath, a fever, or both.  Have a hoarse voice and also have trouble swallowing. Summary  Dysphagia is trouble swallowing. This condition occurs when solids and liquids stick in a person's throat on the way down to the stomach. You may cough or gag while trying to swallow.  Dysphagia has many possible causes.  Treatment for dysphagia depends on the cause of the condition.  Keep all follow-up visits as told by your health care provider. This is important. This information is not intended to replace advice given to you by your health care provider. Make sure you discuss any questions you have with your health care provider. Document Revised: 02/02/2019 Document Reviewed: 02/02/2019 Elsevier Patient Education  [employer].
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
## 2019-12-23 ENCOUNTER — Other Ambulatory Visit: Payer: Self-pay

## 2019-12-23 ENCOUNTER — Encounter: Payer: Self-pay | Admitting: Emergency Medicine

## 2019-12-23 ENCOUNTER — Ambulatory Visit
Admission: EM | Admit: 2019-12-23 | Discharge: 2019-12-23 | Disposition: A | Discharge status: Home / Self Care | Payer: Medicare Other | Attending: Emergency Medicine | Admitting: Emergency Medicine

## 2019-12-23 DIAGNOSIS — S0502XA Injury of conjunctiva and corneal abrasion without foreign body, left eye, initial encounter: Secondary | ICD-10-CM | POA: Diagnosis not present

## 2019-12-23 DIAGNOSIS — H1132 Conjunctival hemorrhage, left eye: Secondary | ICD-10-CM | POA: Diagnosis not present

## 2019-12-23 MED ORDER — POLYMYXIN B-TRIMETHOPRIM 10000-0.1 UNIT/ML-% OP SOLN: 1.0000 [drp] | Freq: Four times a day (QID) | OPHTHALMIC | 0 refills | Status: AC
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
## 2019-12-23 NOTE — Discharge Instructions (Signed)
Use the antibiotic eyedrops as prescribed.    Follow-up with your eye doctor for a recheck in 1 to 2 days if your symptoms are not improving.    Go to the emergency department if you have acute eye pain or changes in your vision.    

## 2019-12-23 NOTE — ED Triage Notes (Signed)
Pt c/o left eye redness and irritation. Started this morning. She state she woke up and noticed it when she was brushing her teeth. No change in vision or photosensitivity. Denies headaches or dizziness. She states that if she looks at a certain angle than she has pain in her eye.

## 2019-12-26 ENCOUNTER — Ambulatory Visit: Payer: Medicare Other

## 2019-12-29 DIAGNOSIS — Z01419 Encounter for gynecological examination (general) (routine) without abnormal findings: Secondary | ICD-10-CM | POA: Diagnosis not present

## 2019-12-29 DIAGNOSIS — Z124 Encounter for screening for malignant neoplasm of cervix: Secondary | ICD-10-CM | POA: Diagnosis not present

## 2019-12-29 DIAGNOSIS — N393 Stress incontinence (female) (male): Secondary | ICD-10-CM | POA: Diagnosis not present

## 2019-12-29 LAB — HM PAP SMEAR: HM Pap smear: NEGATIVE

## 2020-01-12 ENCOUNTER — Encounter: Payer: Self-pay | Admitting: Pulmonary Disease

## 2020-01-12 ENCOUNTER — Ambulatory Visit: Payer: Medicare Other | Admitting: Pulmonary Disease

## 2020-01-12 ENCOUNTER — Other Ambulatory Visit: Payer: Self-pay

## 2020-01-12 VITALS — BP 120/60 | HR 75 | Temp 98.1°F | Ht 65.0 in | Wt 168.0 lb

## 2020-01-12 DIAGNOSIS — R431 Parosmia: Secondary | ICD-10-CM | POA: Diagnosis not present

## 2020-01-12 DIAGNOSIS — R0602 Shortness of breath: Secondary | ICD-10-CM

## 2020-01-12 DIAGNOSIS — K219 Gastro-esophageal reflux disease without esophagitis: Secondary | ICD-10-CM | POA: Diagnosis not present

## 2020-01-12 DIAGNOSIS — J3089 Other allergic rhinitis: Secondary | ICD-10-CM | POA: Diagnosis not present

## 2020-01-12 DIAGNOSIS — Z8616 Personal history of COVID-19: Secondary | ICD-10-CM

## 2020-01-12 MED ORDER — ARNUITY ELLIPTA 100 MCG/ACT IN AEPB: 1.0000 | INHALATION_SPRAY | Freq: Every day | RESPIRATORY_TRACT | 3 refills | Status: DC

## 2020-01-12 MED ORDER — FLUTICASONE PROPIONATE 50 MCG/ACT NA SUSP: 1.0000 | Freq: Two times a day (BID) | NASAL | 2 refills | Status: DC

## 2020-01-12 NOTE — Patient Instructions (Addendum)
Take famotidine (Pepcid) 1 tablet at bedtime  Given your nasal spray Flonase, 1 spray to each nostril twice a day  Arnuity Ellipta 100 mcg 1 puff daily, rinse your mouth well after use  We are scheduling breathing tests   Follow-up in 4 to 6 weeks time call sooner should any new difficulties arise

## 2020-01-13 ENCOUNTER — Telehealth: Payer: Self-pay | Admitting: Pulmonary Disease

## 2020-01-13 NOTE — Telephone Encounter (Signed)
Dr. Jayme Cloud please advise on any changes:  pt states meds that were prescribed today is over $500 total - pt would like to know if there are alternatives - because she can't afford -pr    Incoming call

## 2020-01-16 ENCOUNTER — Telehealth: Payer: Self-pay | Admitting: Pulmonary Disease

## 2020-01-16 NOTE — Telephone Encounter (Signed)
Patient has been set up with appointment to see pharmacy team on 5/11 at 1:20. Nothing further needed at this time

## 2020-01-16 NOTE — Telephone Encounter (Signed)
Could we refer to medication assistance?  Unfortunately not many options.

## 2020-01-17 ENCOUNTER — Ambulatory Visit
Admission: RE | Admit: 2020-01-17 | Discharge: 2020-01-17 | Disposition: A | Discharge status: Home / Self Care | Payer: Medicare Other | Source: Ambulatory Visit | Attending: Internal Medicine | Admitting: Internal Medicine

## 2020-01-17 DIAGNOSIS — R928 Other abnormal and inconclusive findings on diagnostic imaging of breast: Secondary | ICD-10-CM | POA: Diagnosis not present

## 2020-01-17 DIAGNOSIS — N644 Mastodynia: Secondary | ICD-10-CM

## 2020-01-17 NOTE — Telephone Encounter (Signed)
Patient has been set up with appointment to see pharmacy team on 5/11 at 1:20. Nothing further needed at this time 

## 2020-01-18 ENCOUNTER — Other Ambulatory Visit: Payer: Self-pay

## 2020-01-18 ENCOUNTER — Ambulatory Visit: Payer: Medicare Other

## 2020-01-18 DIAGNOSIS — M62838 Other muscle spasm: Secondary | ICD-10-CM | POA: Diagnosis not present

## 2020-01-18 DIAGNOSIS — M533 Sacrococcygeal disorders, not elsewhere classified: Secondary | ICD-10-CM | POA: Insufficient documentation

## 2020-01-18 NOTE — Patient Instructions (Signed)
Stabilization: Diaphragmatic Breathing    Lie with knees bent, feet flat. Place one hand on stomach, other on chest. Breathe deeply through nose, lifting belly hand without any motion of hand on chest. Do this for ~ 5 min. Per night before bed. And throughout the day when you remember or if you feel stressed.
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
## 2020-01-24 ENCOUNTER — Ambulatory Visit: Payer: Medicare Other

## 2020-01-25 ENCOUNTER — Other Ambulatory Visit: Payer: Self-pay

## 2020-01-25 ENCOUNTER — Ambulatory Visit (INDEPENDENT_AMBULATORY_CARE_PROVIDER_SITE_OTHER): Payer: Medicare Other | Admitting: Internal Medicine

## 2020-01-25 ENCOUNTER — Ambulatory Visit (INDEPENDENT_AMBULATORY_CARE_PROVIDER_SITE_OTHER): Payer: Medicare Other

## 2020-01-25 ENCOUNTER — Encounter: Payer: Self-pay | Admitting: Internal Medicine

## 2020-01-25 VITALS — BP 100/70 | HR 79 | Temp 97.4°F | Ht 65.0 in | Wt 166.2 lb

## 2020-01-25 DIAGNOSIS — R0781 Pleurodynia: Secondary | ICD-10-CM

## 2020-01-25 DIAGNOSIS — F339 Major depressive disorder, recurrent, unspecified: Secondary | ICD-10-CM | POA: Diagnosis not present

## 2020-01-25 DIAGNOSIS — Z1211 Encounter for screening for malignant neoplasm of colon: Secondary | ICD-10-CM

## 2020-01-25 DIAGNOSIS — M62838 Other muscle spasm: Secondary | ICD-10-CM | POA: Diagnosis not present

## 2020-01-25 DIAGNOSIS — R1310 Dysphagia, unspecified: Secondary | ICD-10-CM | POA: Diagnosis not present

## 2020-01-25 DIAGNOSIS — E039 Hypothyroidism, unspecified: Secondary | ICD-10-CM

## 2020-01-25 MED ORDER — TRAMADOL HCL 50 MG PO TABS: 50.0000 mg | ORAL_TABLET | Freq: Two times a day (BID) | ORAL | 0 refills | Status: AC | PRN

## 2020-01-25 MED ORDER — CYCLOBENZAPRINE HCL 5 MG PO TABS: 5.0000 mg | ORAL_TABLET | Freq: Every evening | ORAL | 1 refills | Status: DC | PRN
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
## 2020-01-25 NOTE — Progress Notes (Signed)
Everything unfortunately is expensive.  Arnuity was what was best covered by her insurance according to our EMR.  We can refer her to the medication assistance program which I think we have already done but will double check.

## 2020-01-25 NOTE — Patient Instructions (Addendum)
Lidocaine pain patch  Voltaren gel  Tramadol as needed   Rib Fracture  A rib fracture is a break or crack in one of the bones of the ribs. The ribs are long, curved bones that wrap around your chest and attach to your spine and your breastbone. The ribs protect your heart, lungs, and other organs in the chest. A broken or cracked rib is often painful but is not usually serious. Most rib fractures heal on their own over time. However, rib fractures can be more serious if multiple ribs are broken or if broken ribs move out of place and push against other structures or organs. What are the causes? This condition is caused by:  Repetitive movements with high force, such as pitching a baseball or having severe coughing spells.  A direct blow to the chest, such as a sports injury, a car accident, or a fall.  Cancer that has spread to the bones, which can weaken bones and cause them to break. What are the signs or symptoms? Symptoms of this condition include:  Pain when you breathe in or cough.  Pain when someone presses on the injured area.  Feeling short of breath. How is this diagnosed? This condition is diagnosed with a physical exam and medical history. Imaging tests may also be done, such as:  Chest X-ray.  CT scan.  MRI.  Bone scan.  Chest ultrasound. How is this treated? Treatment for this condition depends on the severity of the fracture. Most rib fractures usually heal on their own in 1-3 months. Sometimes healing takes longer if there is a cough that does not stop or if there are other activities that make the injury worse (aggravating factors). While you heal, you will be given medicines to control the pain. You will also be taught deep breathing exercises. Severe injuries may require hospitalization or surgery. Follow these instructions at home: Managing pain, stiffness, and swelling  If directed, apply ice to the injured area. ? Put ice in a plastic bag. ? Place a  towel between your skin and the bag. ? Leave the ice on for 20 minutes, 2-3 times a day.  Take over-the-counter and prescription medicines only as told by your health care provider. Activity  Avoid a lot of activity and any activities or movements that cause pain. Be careful during activities and avoid bumping the injured rib.  Slowly increase your activity as told by your health care provider. General instructions  Do deep breathing exercises as told by your health care provider. This helps prevent pneumonia, which is a common complication of a broken rib. Your health care provider may instruct you to: ? Take deep breaths several times a day. ? Try to cough several times a day, holding a pillow against the injured area. ? Use a device called incentive spirometer to practice deep breathing several times a day.  Drink enough fluid to keep your urine pale yellow.  Do not wear a rib belt or binder. These restrict breathing, which can lead to pneumonia.  Keep all follow-up visits as told by your health care provider. This is important. Contact a health care provider if:  You have a fever. Get help right away if:  You have difficulty breathing or you are short of breath.  You develop a cough that does not stop, or you cough up thick or bloody sputum.  You have nausea, vomiting, or pain in your abdomen.  Your pain gets worse and medicine does not help. Summary  A rib fracture is a break or crack in one of the bones of the ribs.  A broken or cracked rib is often painful but is not usually serious.  Most rib fractures heal on their own over time.  Treatment for this condition depends on the severity of the fracture.  Avoid a lot of activity and any activities or movements that cause pain. This information is not intended to replace advice given to you by your health care provider. Make sure you discuss any questions you have with your health care provider. Document Revised:  08/21/2017 Document Reviewed: 12/08/2016 Elsevier Patient Education  2020 [employer].

## 2020-01-26 ENCOUNTER — Telehealth: Payer: Self-pay | Admitting: Pulmonary Disease

## 2020-01-26 ENCOUNTER — Other Ambulatory Visit: Payer: Medicare Other

## 2020-01-26 MED ORDER — FLUTICASONE PROPIONATE HFA 110 MCG/ACT IN AERO
2.0000 | INHALATION_SPRAY | Freq: Two times a day (BID) | RESPIRATORY_TRACT | 2 refills | Status: DC
Start: 2020-01-26 — End: 2020-07-24

## 2020-01-26 NOTE — Telephone Encounter (Signed)
Dr. Gonzalez please advise. 

## 2020-01-26 NOTE — Telephone Encounter (Signed)
We can try Flovent 110 mcg 2 puffs twice a day.  If this is still expensive she will need to be referred to the medication assistance program.  Arnuity and Flovent are the only level 1 steroids in her plan.  Dr. Reece Agar

## 2020-01-26 NOTE — Telephone Encounter (Signed)
I called and spoke with the patient and made her aware. I have sent the Flovent to the pharmacy.

## 2020-01-27 ENCOUNTER — Ambulatory Visit: Payer: Medicare Other

## 2020-01-31 ENCOUNTER — Other Ambulatory Visit: Payer: Medicare Other

## 2020-02-01 ENCOUNTER — Ambulatory Visit: Payer: Medicare Other

## 2020-02-02 DIAGNOSIS — J9801 Acute bronchospasm: Secondary | ICD-10-CM | POA: Diagnosis not present

## 2020-02-02 DIAGNOSIS — Z01812 Encounter for preprocedural laboratory examination: Secondary | ICD-10-CM | POA: Diagnosis not present

## 2020-02-02 DIAGNOSIS — Z1211 Encounter for screening for malignant neoplasm of colon: Secondary | ICD-10-CM | POA: Diagnosis not present

## 2020-02-02 DIAGNOSIS — K219 Gastro-esophageal reflux disease without esophagitis: Secondary | ICD-10-CM | POA: Diagnosis not present

## 2020-02-03 NOTE — Progress Notes (Signed)
Please sch new hep B vaccine 02/14/20?   TMS

## 2020-02-06 ENCOUNTER — Other Ambulatory Visit (INDEPENDENT_AMBULATORY_CARE_PROVIDER_SITE_OTHER): Payer: Medicare Other

## 2020-02-06 ENCOUNTER — Ambulatory Visit (INDEPENDENT_AMBULATORY_CARE_PROVIDER_SITE_OTHER): Payer: Medicare Other

## 2020-02-06 ENCOUNTER — Other Ambulatory Visit: Payer: Self-pay

## 2020-02-06 DIAGNOSIS — E039 Hypothyroidism, unspecified: Secondary | ICD-10-CM

## 2020-02-06 DIAGNOSIS — Z23 Encounter for immunization: Secondary | ICD-10-CM | POA: Diagnosis not present

## 2020-02-06 DIAGNOSIS — R946 Abnormal results of thyroid function studies: Secondary | ICD-10-CM | POA: Diagnosis not present

## 2020-02-06 NOTE — Progress Notes (Signed)
Patient presented for Hep B injection to left deltoid, patient voiced no concerns nor showed any signs of distress during injection. 

## 2020-02-07 LAB — TSH: TSH: 2.04 u[IU]/mL (ref 0.35–4.50)

## 2020-02-08 ENCOUNTER — Ambulatory Visit: Payer: Medicare Other

## 2020-02-08 ENCOUNTER — Encounter: Payer: Self-pay | Admitting: Internal Medicine

## 2020-02-09 ENCOUNTER — Telehealth: Payer: Self-pay | Admitting: Gastroenterology

## 2020-02-09 ENCOUNTER — Ambulatory Visit: Payer: Medicare Other | Admitting: Gastroenterology

## 2020-02-09 NOTE — Telephone Encounter (Signed)
Patient called and stated that she has no physical issues going on and doesn't need to be seen. Patient wanted appt. Cancelled.

## 2020-02-15 ENCOUNTER — Ambulatory Visit: Payer: Medicare Other

## 2020-02-16 ENCOUNTER — Telehealth: Payer: Self-pay | Admitting: Pharmacist

## 2020-02-16 NOTE — Telephone Encounter (Signed)
Patient scheduled for 02/27/2020 and I will not be in the office.  Call to reschedule appointment.  Patient was referred to [place] for help with medication cost.  Test claims for man and both Flovent and Arnuity are now $37 per month.  Patient states that it is affordable.  Advised that she may have had a deductible to me and that is why they were previously more expensive.  Patient verbalized understanding.  Patient declined rescheduling appointment.  Nothing further needed at this time.   Verlin Fester, PharmD, [place], CPP Clinical Specialty Pharmacist (Rheumatology and Pulmonology)  02/16/2020 2:45 PM

## 2020-02-22 ENCOUNTER — Ambulatory Visit: Payer: Medicare Other

## 2020-02-22 DIAGNOSIS — M533 Sacrococcygeal disorders, not elsewhere classified: Secondary | ICD-10-CM | POA: Insufficient documentation

## 2020-02-22 DIAGNOSIS — M62838 Other muscle spasm: Secondary | ICD-10-CM | POA: Insufficient documentation

## 2020-02-23 ENCOUNTER — Telehealth: Payer: Self-pay | Admitting: Internal Medicine

## 2020-02-23 ENCOUNTER — Ambulatory Visit: Payer: Medicare Other | Admitting: Pulmonary Disease

## 2020-02-23 NOTE — Telephone Encounter (Signed)
-----   Message from Tracy N McLean-Scocuzza, MD sent at 02/03/2020  6:04 PM EDT -----   ----- Message ----- From: Youngblood, Rasheedah R Sent: 02/03/2020  12:06 PM EDT To: Tracy N McLean-Scocuzza, MD  Pt was scheduled. Pt had appt on 02/02/2020 per pt. And pt is scheduled for colonoscopy 04/12/2020. Pt needs 2nd shot order for hepatis. Please advise and Thank you! ----- Message ----- From: McLean-Scocuzza, Tracy N, MD Sent: 01/25/2020   1:22 PM EDT To: Rasheedah R Youngblood  EGD/colonoscopy needed pt never had call for [facility]  Thanks TMS  

## 2020-02-23 NOTE — Telephone Encounter (Signed)
Author: McLean-Scocuzza, Pasty Spillers, MD Service: Internal Medicine Author Type: Physician  Filed: 02/03/2020 6:04 PM Encounter Date: 01/25/2020 Status: Signed  Editor: McLean-Scocuzza, Pasty Spillers, MD (Physician)     Show:Clear all [x] Manual[] Template[] Copied  Added by: [x] McLean-Scocuzza, , MD  [] Hover for details Please sch new hep B vaccine 02/14/20?   TMS      Injection done 02/06/20

## 2020-02-23 NOTE — Telephone Encounter (Signed)
-----   Message from Bevelyn Buckles, MD sent at 02/03/2020  6:04 PM EDT -----   ----- Message ----- From: Karlyne Greenspan Sent: 02/03/2020  12:06 PM EDT To: Pasty Spillers McLean-Scocuzza, MD  Pt was scheduled. Pt had appt on 02/02/2020 per pt. And pt is scheduled for colonoscopy 04/12/2020. Pt needs 2nd shot order for hepatis. Please advise and Thank you! ----- Message ----- From: McLean-Scocuzza, Pasty Spillers, MD Sent: 01/25/2020   1:22 PM EDT To: Karlyne Greenspan  EGD/colonoscopy needed pt never had call for [facility]  Thanks TMS

## 2020-02-27 ENCOUNTER — Other Ambulatory Visit: Payer: Medicare Other

## 2020-02-29 ENCOUNTER — Other Ambulatory Visit: Payer: Self-pay

## 2020-02-29 ENCOUNTER — Ambulatory Visit: Payer: Medicare Other

## 2020-02-29 DIAGNOSIS — M533 Sacrococcygeal disorders, not elsewhere classified: Secondary | ICD-10-CM

## 2020-02-29 DIAGNOSIS — M62838 Other muscle spasm: Secondary | ICD-10-CM | POA: Diagnosis not present
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
## 2020-02-29 NOTE — Patient Instructions (Signed)
As you start wearing your heel-lift only wear it for an hour the first day and increase by an hour each day so you can allow for the body to adapt to the change easily without much pain. If your pain increases by more than 1-2 points, back off slightly or slow down how quickly you increase your wear time. Once you reach a full day of wear, use it as much as possible forever, even in house-shoes or flip-flops if necessary to keep yourself from reverting to bad pelvic and spinal alignment and having symptoms return.    Adjust-a-lift heel lift Can be found at [email or web address]  Flexors, Lunge  Hip Flexor Stretch: Proposal Pose    Maintain pelvic tuck under, lift pubic bone toward navel. Engage posterior hip muscles (firm glute muscles of leg in back position) and shift forward until you feel stretch on front of leg that is down. To increase stretch, maintain balance and ease hips forward. You may use one hand on a chair for balance if needed. Hold for __5__ breaths. Repeat __2-3__ times each leg.  Do _1-2__ times per day.    Hold for 30 seconds (5 deep breaths) and repeat 2-3 times on each side once a day

## 2020-03-06 ENCOUNTER — Ambulatory Visit: Payer: Medicare Other | Admitting: Pulmonary Disease

## 2020-03-07 ENCOUNTER — Ambulatory Visit: Payer: Medicare Other

## 2020-03-14 ENCOUNTER — Ambulatory Visit: Payer: Medicare Other

## 2020-03-29 ENCOUNTER — Telehealth: Payer: Self-pay | Admitting: Internal Medicine

## 2020-03-29 ENCOUNTER — Encounter: Payer: Self-pay | Admitting: Internal Medicine

## 2020-03-29 NOTE — Telephone Encounter (Signed)
Can you check on ortho referral Dr. Joice Lofts kc 10/2019 hip and back pain?   TMS

## 2020-03-29 NOTE — Telephone Encounter (Signed)
Patient has a history of Prolia injections. Please advise

## 2020-04-03 NOTE — Telephone Encounter (Signed)
Patient scheduled to see Dr. Adriana Simas in our neurosurgery department on 11/29/19 at 10:30 AM." [facility] said 24 days ago

## 2020-04-03 NOTE — Telephone Encounter (Signed)
Sorry no orthopedics has seen neurosurgey already and they rec ortho and referral already had been placed Dr. Joice Lofts   Dr. [employer]

## 2020-04-05 ENCOUNTER — Ambulatory Visit (INDEPENDENT_AMBULATORY_CARE_PROVIDER_SITE_OTHER): Payer: Medicare Other

## 2020-04-05 VITALS — Ht 65.0 in | Wt 166.0 lb

## 2020-04-05 DIAGNOSIS — Z748 Other problems related to care provider dependency: Secondary | ICD-10-CM | POA: Diagnosis not present

## 2020-04-05 DIAGNOSIS — Z0000 Encounter for general adult medical examination without abnormal findings: Secondary | ICD-10-CM | POA: Diagnosis not present

## 2020-04-05 NOTE — Patient Instructions (Addendum)
[name] , Thank you for taking time to come for your Medicare Wellness Visit. I appreciate your ongoing commitment to your health goals. Please review the following plan we discussed and let me know if I can assist you in the future.   These are the goals we discussed: Goals      Patient Stated   .  I need to work on portion control when eating (pt-stated)       This is a list of the screening recommended for you and due dates:  Health Maintenance  Topic Date Due  . HIV Screening  Never done  . Colon Cancer Screening  Never done  . Tetanus Vaccine  04/05/2021*  . Flu Shot  04/22/2020  . Mammogram  01/16/2022  . Pap Smear  12/29/2022  . COVID-19 Vaccine  Completed  .  Hepatitis C: One time screening is recommended by Center for Disease Control  (CDC) for  adults born from [birth year] through 1963.   Completed  *Topic was postponed. The date shown is not the original due date.   Immunizations Immunization History  Administered Date(s) Administered  . Hepb-cpg 12/06/2019, 02/06/2020  . Influenza,inj,Quad PF,6+ Mos 10/13/2018, 09/23/2019  . PFIZER SARS-COV-2 Vaccination 12/24/2019, 01/15/2020  . Pneumococcal Polysaccharide-23 10/13/2018   Advanced directives: Mailed to patient per request.   Conditions/risks identified: transportation need to medical appointments. Referral sent. Someone from the team will call and follow up with you no later than 1 week.   Follow up in one year for your annual wellness visit.   Preventive Care 40-64 Years, Female Preventive care refers to lifestyle choices and visits with your health care provider that can promote health and wellness. What does preventive care include?  A yearly physical exam. This is also called an annual well check.  Dental exams once or twice a year.  Routine eye exams. Ask your health care provider how often you should have your eyes checked.  Personal lifestyle choices, including:  Daily care of your teeth and  gums.  Regular physical activity.  Eating a healthy diet.  Avoiding tobacco and drug use.  Limiting alcohol use.  Practicing safe sex.  Taking low-dose aspirin daily starting at age 56.  Taking vitamin and mineral supplements as recommended by your health care provider. What happens during an annual well check? The services and screenings done by your health care provider during your annual well check will depend on your age, overall health, lifestyle risk factors, and family history of disease. Counseling  Your health care provider may ask you questions about your:  Alcohol use.  Tobacco use.  Drug use.  Emotional well-being.  Home and relationship well-being.  Sexual activity.  Eating habits.  Work and work Statistician.  Method of birth control.  Menstrual cycle.  Pregnancy history. Screening  You may have the following tests or measurements:  Height, weight, and BMI.  Blood pressure.  Lipid and cholesterol levels. These may be checked every 5 years, or more frequently if you are over 61 years old.  Skin check.  Lung cancer screening. You may have this screening every year starting at age 41 if you have a 30-pack-year history of smoking and currently smoke or have quit within the past 15 years.  Fecal occult blood test (FOBT) of the stool. You may have this test every year starting at age 29.  Flexible sigmoidoscopy or colonoscopy. You may have a sigmoidoscopy every 5 years or a colonoscopy every 10 years starting at age  50.  Hepatitis C blood test.  Hepatitis B blood test.  Sexually transmitted disease (STD) testing.  Diabetes screening. This is done by checking your blood sugar (glucose) after you have not eaten for a while (fasting). You may have this done every 1-3 years.  Mammogram. This may be done every 1-2 years. Talk to your health care provider about when you should start having regular mammograms. This may depend on whether you have a  family history of breast cancer.  BRCA-related cancer screening. This may be done if you have a family history of breast, ovarian, tubal, or peritoneal cancers.  Pelvic exam and Pap test. This may be done every 3 years starting at age 30. Starting at age 66, this may be done every 5 years if you have a Pap test in combination with an HPV test.  Bone density scan. This is done to screen for osteoporosis. You may have this scan if you are at high risk for osteoporosis. Discuss your test results, treatment options, and if necessary, the need for more tests with your health care provider. Vaccines  Your health care provider may recommend certain vaccines, such as:  Influenza vaccine. This is recommended every year.  Tetanus, diphtheria, and acellular pertussis (Tdap, Td) vaccine. You may need a Td booster every 10 years.  Zoster vaccine. You may need this after age 11.  Pneumococcal 13-valent conjugate (PCV13) vaccine. You may need this if you have certain conditions and were not previously vaccinated.  Pneumococcal polysaccharide (PPSV23) vaccine. You may need one or two doses if you smoke cigarettes or if you have certain conditions. Talk to your health care provider about which screenings and vaccines you need and how often you need them. This information is not intended to replace advice given to you by your health care provider. Make sure you discuss any questions you have with your health care provider. Document Released: 10/05/2015 Document Revised: 05/28/2016 Document Reviewed: 07/10/2015 Elsevier Interactive Patient Education  2017 [employer] Prevention in the Home Falls can cause injuries. They can happen to people of all ages. There are many things you can do to make your home safe and to help prevent falls. What can I do on the outside of my home?  Regularly fix the edges of walkways and driveways and fix any cracks.  Remove anything that might make you trip as  you walk through a door, such as a raised step or threshold.  Trim any bushes or trees on the path to your home.  Use bright outdoor lighting.  Clear any walking paths of anything that might make someone trip, such as rocks or tools.  Regularly check to see if handrails are loose or broken. Make sure that both sides of any steps have handrails.  Any raised decks and porches should have guardrails on the edges.  Have any leaves, snow, or ice cleared regularly.  Use sand or salt on walking paths during winter.  Clean up any spills in your garage right away. This includes oil or grease spills. What can I do in the bathroom?  Use night lights.  Install grab bars by the toilet and in the tub and shower. Do not use towel bars as grab bars.  Use non-skid mats or decals in the tub or shower.  If you need to sit down in the shower, use a plastic, non-slip stool.  Keep the floor dry. Clean up any water that spills on the floor as soon  as it happens.  Remove soap buildup in the tub or shower regularly.  Attach bath mats securely with double-sided non-slip rug tape.  Do not have throw rugs and other things on the floor that can make you trip. What can I do in the bedroom?  Use night lights.  Make sure that you have a light by your bed that is easy to reach.  Do not use any sheets or blankets that are too big for your bed. They should not hang down onto the floor.  Have a firm chair that has side arms. You can use this for support while you get dressed.  Do not have throw rugs and other things on the floor that can make you trip. What can I do in the kitchen?  Clean up any spills right away.  Avoid walking on wet floors.  Keep items that you use a lot in easy-to-reach places.  If you need to reach something above you, use a strong step stool that has a grab bar.  Keep electrical cords out of the way.  Do not use floor polish or wax that makes floors slippery. If you must  use wax, use non-skid floor wax.  Do not have throw rugs and other things on the floor that can make you trip. What can I do with my stairs?  Do not leave any items on the stairs.  Make sure that there are handrails on both sides of the stairs and use them. Fix handrails that are broken or loose. Make sure that handrails are as long as the stairways.  Check any carpeting to make sure that it is firmly attached to the stairs. Fix any carpet that is loose or worn.  Avoid having throw rugs at the top or bottom of the stairs. If you do have throw rugs, attach them to the floor with carpet tape.  Make sure that you have a light switch at the top of the stairs and the bottom of the stairs. If you do not have them, ask someone to add them for you. What else can I do to help prevent falls?  Wear shoes that:  Do not have high heels.  Have rubber bottoms.  Are comfortable and fit you well.  Are closed at the toe. Do not wear sandals.  If you use a stepladder:  Make sure that it is fully opened. Do not climb a closed stepladder.  Make sure that both sides of the stepladder are locked into place.  Ask someone to hold it for you, if possible.  Clearly mark and make sure that you can see:  Any grab bars or handrails.  First and last steps.  Where the edge of each step is.  Use tools that help you move around (mobility aids) if they are needed. These include:  Canes.  Walkers.  Scooters.  Crutches.  Turn on the lights when you go into a dark area. Replace any light bulbs as soon as they burn out.  Set up your furniture so you have a clear path. Avoid moving your furniture around.  If any of your floors are uneven, fix them.  If there are any pets around you, be aware of where they are.  Review your medicines with your doctor. Some medicines can make you feel dizzy. This can increase your chance of falling. Ask your doctor what other things that you can do to help prevent  falls. This information is not intended to replace advice given to  you by your health care provider. Make sure you discuss any questions you have with your health care provider. Document Released: 07/05/2009 Document Revised: 02/14/2016 Document Reviewed: 10/13/2014 Elsevier Interactive Patient Education  2017 [employer].
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
## 2020-04-11 DIAGNOSIS — Z01812 Encounter for preprocedural laboratory examination: Secondary | ICD-10-CM | POA: Diagnosis not present

## 2020-04-12 DIAGNOSIS — Z1211 Encounter for screening for malignant neoplasm of colon: Secondary | ICD-10-CM | POA: Diagnosis not present

## 2020-04-12 DIAGNOSIS — R1310 Dysphagia, unspecified: Secondary | ICD-10-CM | POA: Diagnosis not present

## 2020-04-12 DIAGNOSIS — K648 Other hemorrhoids: Secondary | ICD-10-CM | POA: Diagnosis not present

## 2020-04-12 DIAGNOSIS — K6289 Other specified diseases of anus and rectum: Secondary | ICD-10-CM | POA: Diagnosis not present

## 2020-04-12 DIAGNOSIS — K621 Rectal polyp: Secondary | ICD-10-CM | POA: Diagnosis not present

## 2020-04-13 LAB — HM COLONOSCOPY

## 2020-04-23 ENCOUNTER — Telehealth: Payer: Self-pay | Admitting: Internal Medicine

## 2020-04-23 ENCOUNTER — Other Ambulatory Visit: Payer: Self-pay | Admitting: Internal Medicine

## 2020-04-23 DIAGNOSIS — G8929 Other chronic pain: Secondary | ICD-10-CM

## 2020-04-23 MED ORDER — MELOXICAM 7.5 MG PO TABS: 7.5000 mg | ORAL_TABLET | Freq: Two times a day (BID) | ORAL | 1 refills | Status: DC | PRN

## 2020-04-23 NOTE — Telephone Encounter (Signed)
Pt needs meloxicam refill sent to [facility].  Pt also said she needs a mental health referral. Also if she was eligible to get pick up service because she don't have a car. She states someone was supposed to be looking into this.

## 2020-04-23 NOTE — Telephone Encounter (Signed)
[name]  She can call [facility] for an appt there no referral needed Camila Li did not take her insurance therapy here  If she wants psychiatry referral they have therapy at [facility] psych services and may take her insurance   [name]  did you discuss transportation issues with patient and looking into this for her?

## 2020-04-24 NOTE — Telephone Encounter (Signed)
Yes. Referral sent to connected care to assist with setting up transportation to medical appointments. I was notified they are running behind. Patient to be contacted by [facility] Team.

## 2020-05-14 ENCOUNTER — Telehealth: Payer: Self-pay | Admitting: Internal Medicine

## 2020-05-18 ENCOUNTER — Other Ambulatory Visit: Payer: Self-pay

## 2020-05-18 ENCOUNTER — Telehealth: Payer: Medicare Other | Admitting: Nurse Practitioner

## 2020-05-18 DIAGNOSIS — Z0289 Encounter for other administrative examinations: Secondary | ICD-10-CM

## 2020-05-27 ENCOUNTER — Encounter: Payer: Self-pay | Admitting: Internal Medicine

## 2020-06-15 ENCOUNTER — Telehealth: Payer: Self-pay | Admitting: *Deleted

## 2020-06-15 ENCOUNTER — Encounter: Payer: Self-pay | Admitting: Internal Medicine

## 2020-06-15 ENCOUNTER — Telehealth (INDEPENDENT_AMBULATORY_CARE_PROVIDER_SITE_OTHER): Payer: Medicare Other | Admitting: Internal Medicine

## 2020-06-15 ENCOUNTER — Other Ambulatory Visit: Payer: Self-pay

## 2020-06-15 VITALS — Ht 65.0 in | Wt 165.0 lb

## 2020-06-15 DIAGNOSIS — F329 Major depressive disorder, single episode, unspecified: Secondary | ICD-10-CM

## 2020-06-15 DIAGNOSIS — R0602 Shortness of breath: Secondary | ICD-10-CM | POA: Diagnosis not present

## 2020-06-15 DIAGNOSIS — I10 Essential (primary) hypertension: Secondary | ICD-10-CM | POA: Diagnosis not present

## 2020-06-15 DIAGNOSIS — F32A Depression, unspecified: Secondary | ICD-10-CM

## 2020-06-15 DIAGNOSIS — J9801 Acute bronchospasm: Secondary | ICD-10-CM | POA: Diagnosis not present

## 2020-06-15 DIAGNOSIS — J309 Allergic rhinitis, unspecified: Secondary | ICD-10-CM

## 2020-06-15 DIAGNOSIS — E039 Hypothyroidism, unspecified: Secondary | ICD-10-CM

## 2020-06-15 DIAGNOSIS — N3281 Overactive bladder: Secondary | ICD-10-CM

## 2020-06-15 DIAGNOSIS — M25551 Pain in right hip: Secondary | ICD-10-CM

## 2020-06-15 DIAGNOSIS — K219 Gastro-esophageal reflux disease without esophagitis: Secondary | ICD-10-CM

## 2020-06-15 DIAGNOSIS — G8929 Other chronic pain: Secondary | ICD-10-CM

## 2020-06-15 DIAGNOSIS — Z0184 Encounter for antibody response examination: Secondary | ICD-10-CM

## 2020-06-15 DIAGNOSIS — F419 Anxiety disorder, unspecified: Secondary | ICD-10-CM

## 2020-06-15 DIAGNOSIS — Z1159 Encounter for screening for other viral diseases: Secondary | ICD-10-CM

## 2020-06-15 MED ORDER — FLUTICASONE PROPIONATE 50 MCG/ACT NA SUSP
1.0000 | Freq: Two times a day (BID) | NASAL | 12 refills | Status: DC
Start: 2020-06-15 — End: 2020-06-15

## 2020-06-15 MED ORDER — FAMOTIDINE 20 MG PO TABS: 20.0000 mg | ORAL_TABLET | Freq: Every day | ORAL | 3 refills | Status: DC

## 2020-06-15 MED ORDER — FESOTERODINE FUMARATE ER 4 MG PO TB24: 4.0000 mg | ORAL_TABLET | Freq: Every day | ORAL | 0 refills | Status: DC

## 2020-06-15 MED ORDER — FLUTICASONE PROPIONATE 50 MCG/ACT NA SUSP: 1.0000 | Freq: Two times a day (BID) | NASAL | 12 refills | Status: DC

## 2020-06-15 MED ORDER — AMLODIPINE BESYLATE 2.5 MG PO TABS: 2.5000 mg | ORAL_TABLET | Freq: Every day | ORAL | 3 refills | Status: DC

## 2020-06-15 MED ORDER — ALBUTEROL SULFATE HFA 108 (90 BASE) MCG/ACT IN AERS: 2.0000 | INHALATION_SPRAY | Freq: Four times a day (QID) | RESPIRATORY_TRACT | 12 refills | Status: DC | PRN

## 2020-06-15 NOTE — Patient Instructions (Addendum)
Stop hydrochlorothiazide 12.5 mg daily and start norvasc 2.5 mg daily   [name] is for overactive bladder   Let me know who is covered for your insurance Ear Nose, Throat  And Therapy   Urinary Incontinence  Urinary incontinence refers to a condition in which a person is unable to control where and when to pass urine. A person with this condition will urinate when he or she does not mean to (involuntarily). What are the causes? This condition may be caused by:  Medicines.  Infections.  Constipation.  Overactive bladder muscles.  Weak bladder muscles.  Weak pelvic floor muscles. These muscles provide support for the bladder, intestine, and, in women, the uterus.  Enlarged prostate in men. The prostate is a gland near the bladder. When it gets too big, it can pinch the urethra. With the urethra blocked, the bladder can weaken and lose the ability to empty properly.  Surgery.  Emotional factors, such as anxiety, stress, or post-traumatic stress disorder (PTSD).  Pelvic organ prolapse. This happens in women when organs shift out of place and into the vagina. This shift can prevent the bladder and urethra from working properly. What increases the risk? The following factors may make you more likely to develop this condition:  Older age.  Obesity and physical inactivity.  Pregnancy and childbirth.  Menopause.  Diseases that affect the nerves or spinal cord (neurological diseases).  Long-term (chronic) coughing. This can increase pressure on the bladder and pelvic floor muscles. What are the signs or symptoms? Symptoms may vary depending on the type of urinary incontinence you have. They include:  A sudden urge to urinate, but passing urine involuntarily before you can get to a bathroom (urge incontinence).  Suddenly passing urine with any activity that forces urine to pass, such as coughing, laughing, exercise, or sneezing (stress incontinence).  Needing to urinate  often, but urinating only a small amount, or constantly dribbling urine (overflow incontinence).  Urinating because you cannot get to the bathroom in time due to a physical disability, such as arthritis or injury, or communication and thinking problems, such as Alzheimer disease (functional incontinence). How is this diagnosed? This condition may be diagnosed based on:  Your medical history.  A physical exam.  Tests, such as: ? Urine tests. ? X-rays of your kidney and bladder. ? Ultrasound. ? CT scan. ? Cystoscopy. In this procedure, a health care provider inserts a tube with a light and camera (cystoscope) through the urethra and into the bladder in order to check for problems. ? Urodynamic testing. These tests assess how well the bladder, urethra, and sphincter can store and release urine. There are different types of urodynamic tests, and they vary depending on what the test is measuring. To help diagnose your condition, your health care provider may recommend that you keep a log of when you urinate and how much you urinate. How is this treated? Treatment for this condition depends on the type of incontinence that you have and its cause. Treatment may include:  Lifestyle changes, such as: ? Quitting smoking. ? Maintaining a healthy weight. ? Staying active. Try to get 150 minutes of moderate-intensity exercise every week. Ask your health care provider which activities are safe for you. ? Eating a healthy diet.  Avoid high-fat foods, like fried foods.  Avoid refined carbohydrates like white bread and white rice.  Limit how much alcohol and caffeine you drink.  Increase your fiber intake. Foods such as fresh fruits, vegetables, beans, and whole grains  are healthy sources of fiber.  Pelvic floor muscle exercises.  Bladder training, such as lengthening the amount of time between bathroom breaks, or using the bathroom at regular intervals.  Using techniques to suppress bladder  urges. This can include distraction techniques or controlled breathing exercises.  Medicines to relax the bladder muscles and prevent bladder spasms.  Medicines to help slow or prevent the growth of a man's prostate.  Botox injections. These can help relax the bladder muscles.  Using pulses of electricity to help change bladder reflexes (electrical nerve stimulation).  For women, using a medical device to prevent urine leaks. This is a small, tampon-like, disposable device that is inserted into the urethra.  Injecting collagen or carbon beads (bulking agents) into the urinary sphincter. These can help thicken tissue and close the bladder opening.  Surgery. Follow these instructions at home: Lifestyle  Limit alcohol and caffeine. These can fill your bladder quickly and irritate it.  Keep yourself clean to help prevent odors and skin damage. Ask your doctor about special skin creams and cleansers that can protect the skin from urine.  Consider wearing pads or adult diapers. Make sure to change them regularly, and always change them right after experiencing incontinence. General instructions  Take over-the-counter and prescription medicines only as told by your health care provider.  Use the bathroom about every 3-4 hours, even if you do not feel the need to urinate. Try to empty your bladder completely every time. After urinating, wait a minute. Then try to urinate again.  Make sure you are in a relaxed position while urinating.  If your incontinence is caused by nerve problems, keep a log of the medicines you take and the times you go to the bathroom.  Keep all follow-up visits as told by your health care provider. This is important. Contact a health care provider if:  You have pain that gets worse.  Your incontinence gets worse. Get help right away if:  You have a fever or chills.  You are unable to urinate.  You have redness in your groin area or down your  legs. Summary  Urinary incontinence refers to a condition in which a person is unable to control where and when to pass urine.  This condition may be caused by medicines, infection, weak bladder muscles, weak pelvic floor muscles, enlargement of the prostate (in men), or surgery.  The following factors increase your risk for developing this condition: older age, obesity, pregnancy and childbirth, menopause, neurological diseases, and chronic coughing.  There are several types of urinary incontinence. They include urge incontinence, stress incontinence, overflow incontinence, and functional incontinence.  This condition is usually treated first with lifestyle and behavioral changes, such as quitting smoking, eating a healthier diet, and doing regular pelvic floor exercises. Other treatment options include medicines, bulking agents, medical devices, electrical nerve stimulation, or surgery. This information is not intended to replace advice given to you by your health care provider. Make sure you discuss any questions you have with your health care provider. Document Revised: 09/18/2017 Document Reviewed: 12/18/2016 Elsevier Patient Education  2020 Elsevier Inc.   Solifenacin tablets What is this medicine? SOLIFENACIN (sol i FEN a cin) is used to treat overactive bladder. This medicine reduces the amount of bathroom visits. It may also help to control wetting accidents. It may be used alone, but sometimes may be given with other treatments. This medicine may be used for other purposes; ask your health care provider or pharmacist if you have questions.  COMMON BRAND NAME(S): VESIcare What should I tell my health care provider before I take this medicine? They need to know if you have any of these conditions:  glaucoma  history of irregular heartbeat  kidney disease  liver disease  problems urinating  prostate disease  stomach or intestine problems  an unusual or allergic reaction  to solifenacin, other medicines, foods, dyes, or preservatives  pregnant or trying to get pregnant  breast-feeding How should I use this medicine? Take this medicine by mouth with a glass of water. Follow the directions on the prescription label. Swallow the tablets whole. You can take it with or without food. If it upsets your stomach, take it with food. Take your doses at regular intervals. Do not take your medicine more often than directed. Do not stop taking except on the advice of your doctor or health care professional. Talk to your pediatrician regarding the use of this medicine in children. Special care may be needed. Overdosage: If you think you have taken too much of this medicine contact a poison control center or emergency room at once. NOTE: This medicine is only for you. Do not share this medicine with others. What if I miss a dose? If you miss a dose, take it as soon as you can. If it is almost time for your next dose, take only that dose. Do not take double or extra doses. What may interact with this medicine? Do not take this medicine with any of the following medications:  cisapride  dronedarone  pimozide  thioridazine This medicine may also interact with the following medications:  atropine  certain other medicines for bladder problems like oxybutynin, tolterodine  certain medicines for fungal infections like fluconazole, itraconazole, ketoconazole, posaconazole, voriconazole  certain medicines for travel sickness like scopolamine  other medicines that prolong the QT interval (cause an abnormal heart rhythm) like dofetilide, ziprasidone  rifampin  St. John's Wort This list may not describe all possible interactions. Give your health care provider a list of all the medicines, herbs, non-prescription drugs, or dietary supplements you use. Also tell them if you smoke, drink alcohol, or use illegal drugs. Some items may interact with your medicine. What should I  watch for while using this medicine? Visit your doctor or health care professional for regular checks on your progress. You may need to limit your intake tea, coffee, caffeinated sodas, and alcohol. These drinks may make your symptoms worse. You may get drowsy or dizzy. Do not drive, use machinery, or do anything that needs mental alertness until you know how this medicine affects you. Do not stand or sit up quickly, especially if you are an older patient. This reduces the risk of dizzy or fainting spells. Your mouth may get dry. Chewing sugarless gum or sucking hard candy, and drinking plenty of water may help. Contact your doctor if the problem does not go away or is severe. This medicine may cause dry eyes and blurred vision. If you wear contact lenses, you may feel some discomfort. Lubricating drops may help. See your eye care professional if the problem does not go away or is severe. Avoid extreme heat. This medicine can cause you to sweat less than normal. Your body temperature could increase to dangerous levels, which may lead to heat stroke. What side effects may I notice from receiving this medicine? Side effects that you should report to your doctor or health care professional as soon as possible:  allergic reactions like skin rash, itching or hives,  swelling of the face, lips, or tongue  breathing problems  changes in vision  confusion  eye pain  fast, irregular heartbeat  feeling faint or lightheaded, falls  hallucinations, loss of contact with reality  signs and symptoms of heat stroke such as decreased sweating; high temperature or fever; nausea; unusually weak or tired  trouble passing urine or change in the amount of urine Side effects that usually do not require medical attention (report to your doctor or health care professional if they continue or are bothersome):  constipation  dizziness  drowsiness  headache  stomach upset This list may not describe all  possible side effects. Call your doctor for medical advice about side effects. You may report side effects to FDA at 1-800-FDA-1088. Where should I keep my medicine? Keep out of the reach of children. Store at room temperature between 15 and 30 degrees C (59 and 86 degrees F). Throw away any unused medicine after the expiration date. NOTE: This sheet is a summary. It may not cover all possible information. If you have questions about this medicine, talk to your doctor, pharmacist, or health care provider.  2020 Elsevier/Gold Standard (2018-08-31 10:17:17)

## 2020-06-15 NOTE — Progress Notes (Signed)
Patient presenting with throat issues. Patient had Covid-19, 07/08/2019, and has since had trouble swallowing. States when laying down feels like her throat is closing.   Patient states she is not sleeping good as she is constantly getting up urinate.

## 2020-06-15 NOTE — Telephone Encounter (Signed)
I saw this patient in April.  She never kept her follow-up appointment as instructed.  She also was referred to the medication management/pharmacy assistance program and she never kept that appointment.  We do not have samples of inhaled corticosteroids and we have been in general short of all samples.  Pepcid is over-the-counter be readily available.  Patient has not called for an appointment when she missed her follow-up appointment on 15 June.  She should call and make follow-up appointment and we will try to assist in any way we can but we have already tried and she has failed to follow-up with the required appointments.

## 2020-06-15 NOTE — Addendum Note (Signed)
Addended by: Quentin Ore on: 06/15/2020 10:11 AM   Modules accepted: Orders

## 2020-06-15 NOTE — Telephone Encounter (Signed)
Patient is aware of below message/recommendations and voiced her understanding.  appt has been scheduled for 07/24/2020. Rx for Flovent has been sent to [facility] per patient request. Okay per Dr. Jayme Cloud verbally.  Nothing further needed.

## 2020-06-15 NOTE — Telephone Encounter (Signed)
Called and spoke to patient.  Patient states that she not currently not taking a maintenance inhaler. Patient is only using Ventolin at bedtime.  Per last office note, patient was prescribed Arnuity. She did not pick up Rx, due to 200 dollar co pay.  She is experiencing sob mainly at night. She feels that her throat is closing up when she lays down.  Patient was scheduled with our pharmacy team back in May, however she had to reschedule due to transportation issues.  Per pharmacy teams phone note on 02/16/2020, a test claim was ran and both Arnuity and flovent had a 37 dollar co pay.  Dr. Jayme Cloud, please advise. Thanks

## 2020-06-15 NOTE — Telephone Encounter (Signed)
-----   Message from Bevelyn Buckles, MD sent at 06/15/2020  9:53 AM EDT ----- Pt did not get flovent/arnuity due to cost and states cant afford  Is there a pt asst program or do you all have samples  Also saw in note pfts rec she has not had and did not start pepcid but I sent this in for her  Still c/o bronchospasm sx's esp with cig smoke  Can your office reach out to  her or schedule f/u   Thanks TMS
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
## 2020-06-15 NOTE — Telephone Encounter (Signed)
On 02/16/20 pt had pharmacy appt and stated could afford the copay for these meds   LMTCB to see what the issue is  Will forward to [facility] triage for f/u as this is Jayme Cloud pt

## 2020-07-03 ENCOUNTER — Encounter: Payer: Self-pay | Admitting: Internal Medicine

## 2020-07-03 ENCOUNTER — Ambulatory Visit (INDEPENDENT_AMBULATORY_CARE_PROVIDER_SITE_OTHER): Payer: Medicare Other

## 2020-07-03 ENCOUNTER — Other Ambulatory Visit: Payer: Self-pay

## 2020-07-03 VITALS — BP 109/77 | HR 65

## 2020-07-03 DIAGNOSIS — Z23 Encounter for immunization: Secondary | ICD-10-CM | POA: Diagnosis not present

## 2020-07-03 DIAGNOSIS — I10 Essential (primary) hypertension: Secondary | ICD-10-CM

## 2020-07-03 NOTE — Progress Notes (Signed)
Patient is here for a BP check due to bp being high at last visit, as per patient.  Currently patients BP is 109/77 and BPM is 65.  Patient has no complaints of headaches, blurry vision, chest pain, arm pain, light headedness, dizziness, and nor jaw pain. Please see previous note for order 06-15-20.

## 2020-07-16 ENCOUNTER — Telehealth (INDEPENDENT_AMBULATORY_CARE_PROVIDER_SITE_OTHER): Payer: Medicare Other | Admitting: Family Medicine

## 2020-07-16 ENCOUNTER — Other Ambulatory Visit: Payer: Medicare Other

## 2020-07-16 ENCOUNTER — Encounter: Payer: Self-pay | Admitting: Family Medicine

## 2020-07-16 ENCOUNTER — Other Ambulatory Visit: Payer: Self-pay

## 2020-07-16 VITALS — Ht 65.0 in | Wt 168.0 lb

## 2020-07-16 DIAGNOSIS — R1310 Dysphagia, unspecified: Secondary | ICD-10-CM | POA: Diagnosis not present

## 2020-07-16 DIAGNOSIS — J029 Acute pharyngitis, unspecified: Secondary | ICD-10-CM | POA: Diagnosis not present
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
## 2020-07-16 NOTE — Assessment & Plan Note (Addendum)
Intermittent issue though has had recurrence over the last week.  Also has some pain in her throat.  Current symptoms could be related to strep throat though given that this was brought on by cigarette smoke and has occurred previously it may be related to some other underlying cause.  We will swab her for strep.  If that is positive we will treat appropriately.  If it is negative will refer to ENT.  She will come to the office this afternoon for noncontact strep swab.

## 2020-07-17 ENCOUNTER — Other Ambulatory Visit (INDEPENDENT_AMBULATORY_CARE_PROVIDER_SITE_OTHER): Payer: Medicare Other

## 2020-07-17 DIAGNOSIS — Z20822 Contact with and (suspected) exposure to covid-19: Secondary | ICD-10-CM

## 2020-07-17 DIAGNOSIS — R1310 Dysphagia, unspecified: Secondary | ICD-10-CM

## 2020-07-17 DIAGNOSIS — J029 Acute pharyngitis, unspecified: Secondary | ICD-10-CM | POA: Diagnosis not present

## 2020-07-18 LAB — NOVEL CORONAVIRUS, NAA: SARS-CoV-2, NAA: NOT DETECTED

## 2020-07-18 LAB — SARS-COV-2, NAA 2 DAY TAT

## 2020-07-19 ENCOUNTER — Other Ambulatory Visit: Payer: Self-pay | Admitting: Family Medicine

## 2020-07-19 DIAGNOSIS — J029 Acute pharyngitis, unspecified: Secondary | ICD-10-CM

## 2020-07-19 DIAGNOSIS — R1310 Dysphagia, unspecified: Secondary | ICD-10-CM

## 2020-07-19 LAB — POCT RAPID STREP A (OFFICE): Rapid Strep A Screen: NEGATIVE

## 2020-07-19 NOTE — Addendum Note (Signed)
Addended by: Warden Fillers on: 07/19/2020 08:23 AM   Modules accepted: Orders

## 2020-07-20 ENCOUNTER — Encounter: Payer: Self-pay | Admitting: Family Medicine

## 2020-07-20 ENCOUNTER — Telehealth: Payer: Self-pay

## 2020-07-20 NOTE — Telephone Encounter (Signed)
Pt called and states that ENT needs her recent covid test faxed to them before they will schedule her appt. Please advise

## 2020-07-23 NOTE — Telephone Encounter (Signed)
See 07/20/20 Patient message encounter

## 2020-07-24 ENCOUNTER — Ambulatory Visit: Payer: Medicare Other | Admitting: Pulmonary Disease

## 2020-07-24 ENCOUNTER — Encounter: Payer: Self-pay | Admitting: Pulmonary Disease

## 2020-07-24 ENCOUNTER — Other Ambulatory Visit: Payer: Self-pay

## 2020-07-24 VITALS — BP 118/72 | HR 99 | Temp 97.5°F | Ht 65.0 in | Wt 173.0 lb

## 2020-07-24 DIAGNOSIS — R431 Parosmia: Secondary | ICD-10-CM

## 2020-07-24 DIAGNOSIS — R0602 Shortness of breath: Secondary | ICD-10-CM

## 2020-07-24 DIAGNOSIS — J683 Other acute and subacute respiratory conditions due to chemicals, gases, fumes and vapors: Secondary | ICD-10-CM

## 2020-07-24 DIAGNOSIS — Z8616 Personal history of COVID-19: Secondary | ICD-10-CM | POA: Diagnosis not present

## 2020-07-24 MED ORDER — DULERA 100-5 MCG/ACT IN AERO: 2.0000 | INHALATION_SPRAY | Freq: Two times a day (BID) | RESPIRATORY_TRACT | 11 refills | Status: DC

## 2020-07-24 NOTE — Progress Notes (Signed)
Subjective:    Patient ID: [name], female    DOB: Nov 06, 1955, 64 y.o.   MRN: [id number]  HPI [name] is a 64 year old lifelong never smoker who presents for follow-up of "a breathing problem" triggered by smells.  Her initial evaluation here was on 12 January 2020.  Please refer to that note for details.  Her issues started after COVID-19 pneumonia in October 2020.  This issue occurred while she was still in [place].  She has since relocated to this area.  Initially she had anosmia and had issues with mild pneumonia with COVID-19.  Since her recovery however she developed hyperosmia and that certain smells cause her an intense reaction of feeling like she is going to gag and feels short of breath.  She occasionally will notice a raspy voice.  She has had issues with gastroesophageal reflux and dysphagia associated with this at times.  She does note that albuterol sometimes makes her symptoms better but this is not consistent.  She is currently on no controller medication and cites price has difficulty obtaining these.  At a prior visit we had recommended use of Arnuity however she was not able to get this medication due to cost.  Was then switched to Flovent HFA with the same result.  She did not get PFTs as previously ordered.  She has not had any fevers, chills or sweats.  She has had to relocate to a different apartment due to the "smells" coming from the apartment of her next-door neighbor.  She stated that she could smell when he was smoking in the apartment.   Review of Systems A 10 point review of systems was performed and it is as noted above otherwise negative.  No Known Allergies  Current Meds  Medication Sig  . albuterol (VENTOLIN HFA) 108 (90 Base) MCG/ACT inhaler Inhale 2 puffs into the lungs every 6 (six) hours as needed for wheezing or shortness of breath.  Marland Kitchen amLODipine (NORVASC) 2.5 MG tablet Take 1 tablet (2.5 mg total) by mouth daily.  . Ascorbic Acid (VITAMIN C PO) Take by  mouth.  Marland Kitchen BIOTIN PO Take by mouth.  . Cyanocobalamin (B-12 PO) Take by mouth.  . cyclobenzaprine (FLEXERIL) 5 MG tablet Take 1 tablet (5 mg total) by mouth at bedtime as needed for muscle spasms.  . diphenhydrAMINE HCl, Sleep, (ZZZQUIL PO) Take by mouth.  . famotidine (PEPCID) 20 MG tablet Take 1 tablet (20 mg total) by mouth at bedtime.  . fesoterodine (TOVIAZ) 4 MG TB24 tablet Take 1 tablet (4 mg total) by mouth daily.  . fluticasone (FLONASE) 50 MCG/ACT nasal spray Place 1 spray into both nostrils 2 (two) times daily.  Marland Kitchen levothyroxine (SYNTHROID) 175 MCG tablet Take 1 tablet (175 mcg total) by mouth daily before breakfast. 30 min before food  . meloxicam (MOBIC) 7.5 MG tablet Take 1 tablet (7.5 mg total) by mouth 2 (two) times daily as needed.  . Multiple Vitamins-Minerals (ZINC PO) Take by mouth.  . Probiotic Product (PROBIOTIC DAILY PO) Take by mouth.  Marland Kitchen VITAMIN D, CHOLECALCIFEROL, PO Take 2,000 mg by mouth.  . [DISCONTINUED] fluticasone (FLOVENT HFA) 110 MCG/ACT inhaler Inhale 2 puffs into the lungs 2 (two) times daily.   Immunization History  Administered Date(s) Administered  . Hepb-cpg 12/06/2019, 02/06/2020  . Influenza,inj,Quad PF,6+ Mos 10/13/2018, 09/23/2019, 07/03/2020  . PFIZER SARS-COV-2 Vaccination 12/24/2019, 01/15/2020  . Pneumococcal Polysaccharide-23 10/13/2018   Social History   Tobacco Use  . Smoking status: Never Smoker  .  Smokeless tobacco: Never Used  Substance Use Topics  . Alcohol use: Yes    Alcohol/week: 2.0 standard drinks    Types: 2 Glasses of wine per week       Objective:   Physical Exam BP 118/72 (BP Location: Left Arm, Cuff Size: Normal)   Pulse 99   Temp (!) 97.5 F (36.4 C) (Temporal)   Ht 5\' 5"  (1.651 m)   Wt 173 lb (78.5 kg)   SpO2 98%   BMI 28.79 kg/m  GENERAL: HEAD: Normocephalic, atraumatic.  EYES: Pupils equal, round, reactive to light.  No scleral icterus.  MOUTH:  NECK: Supple. No thyromegaly. Trachea midline. No JVD.  No  adenopathy. PULMONARY: Good air entry bilaterally.  No adventitious sounds. CARDIOVASCULAR: S1 and S2. Regular rate and rhythm.  ABDOMEN: MUSCULOSKELETAL: No joint deformity, no clubbing, no edema.  NEUROLOGIC:  SKIN: Intact,warm,dry. PSYCH:     Assessment & Plan:     ICD-10-CM   1. Shortness of breath  R06.02 Pulmonary Function Test [facility] Only   Sporadic and occurs only in reaction to strong odors May be mild reactive airways dysfunction syndrome   2. 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 Hyperosmia  R43.1    This seems to be the trigger for her symptoms Query rebound from anosmia during COVID-19 Query migraine variant  4. Personal history of COVID-19  Z86.16    This issue adds complexity to her management Hyperosmia may be sequela    Orders Placed This Encounter  Procedures  . Pulmonary Function Test [facility] Only    Next available.    Standing Status:   Future    Standing Expiration Date:   07/24/2021    Order Specific Question:   Full PFT: includes the following: basic spirometry, spirometry pre & post bronchodilator, diffusion capacity (DLCO), lung volumes    Answer:   Full PFT   Meds ordered this encounter  Medications  . mometasone-formoterol (DULERA) 100-5 MCG/ACT AERO    Sig: Inhale 2 puffs into the lungs in the morning and at bedtime.    Dispense:  13 g    Refill:  11   We will see the patient in 3 months time.  She is to contact 13/10/2020 prior to that time should any new difficulties arise.  She was advised that she needs to follow through with pulmonary function testing.   [place], MD [facility]   *This note was dictated using voice recognition software/Dragon.  Despite best efforts to proofread, errors can occur which can change the meaning.  Any change was purely unintentional.

## 2020-07-24 NOTE — Patient Instructions (Signed)
We are going to give you a trial of Dulera 100/5, 2 inhalations twice a day.  This is a maintenance inhaler.  Use it every day.  Make sure you rinse your mouth well after you use it.  You may use a little baking soda in the rinse water to help clearing all the medication from the mouth once you use it.  We are re-scheduling you for breathing tests.   We will see you in follow-up in 3 months time call sooner should any new problems arise.

## 2020-07-31 ENCOUNTER — Telehealth: Payer: Self-pay | Admitting: Pulmonary Disease

## 2020-07-31 DIAGNOSIS — R490 Dysphonia: Secondary | ICD-10-CM | POA: Diagnosis not present

## 2020-07-31 DIAGNOSIS — K219 Gastro-esophageal reflux disease without esophagitis: Secondary | ICD-10-CM | POA: Diagnosis not present

## 2020-07-31 NOTE — Telephone Encounter (Signed)
Pt states her [name] is too expensive and is requesting an alternative.   Called pharmacy who verified that [name] is preferred drug, copay is $70/30 day supply.    Routing to Pharmacy team to see what can be offered to patient.  Thanks!

## 2020-08-01 NOTE — Telephone Encounter (Signed)
Patient is aware of below message and voiced her understanding. Patient will continue with [facility] and will pick Rx up tomorrow.  Nothing further is needed.

## 2020-08-01 NOTE — Telephone Encounter (Signed)
Ran test claims for 1 month supply for Symbicort, Advair HFA and Diskus, Breo- all copays were $37.00. Ran test claim for Dulera 200mg for 1 month- copay was also $37.00.   Appears patient had a deductible that was met with the Dulera 100mg filled by [facility]. Then all fills after that are $37.00/month. *Could be subject to change in the new calendar year or when her plan's deductible resets*  Until further notice, pharmacy team will no longer be able to accommodate inhaler benefits investigations. We will be focusing on strictly Specialty medications. Sorry for the inconvenience!  Thanks!   

## 2020-08-07 ENCOUNTER — Telehealth: Payer: Self-pay | Admitting: Pulmonary Disease

## 2020-08-07 DIAGNOSIS — J301 Allergic rhinitis due to pollen: Secondary | ICD-10-CM | POA: Diagnosis not present

## 2020-08-08 DIAGNOSIS — H524 Presbyopia: Secondary | ICD-10-CM | POA: Diagnosis not present

## 2020-08-09 DIAGNOSIS — M545 Low back pain, unspecified: Secondary | ICD-10-CM | POA: Diagnosis not present

## 2020-08-09 DIAGNOSIS — G8929 Other chronic pain: Secondary | ICD-10-CM | POA: Diagnosis not present

## 2020-08-09 DIAGNOSIS — M7061 Trochanteric bursitis, right hip: Secondary | ICD-10-CM | POA: Diagnosis not present

## 2020-08-09 DIAGNOSIS — M25551 Pain in right hip: Secondary | ICD-10-CM | POA: Diagnosis not present

## 2020-08-10 ENCOUNTER — Encounter: Payer: Self-pay | Admitting: Internal Medicine

## 2020-08-10 ENCOUNTER — Telehealth: Payer: Self-pay

## 2020-08-10 NOTE — Telephone Encounter (Addendum)
[name] called in stating that she is experiencing Shortness of breath when walking. She was concerned and wanted to know if she should wait on going to the hospital or if she should go there tonight. Pt states that she is concerned over price. I recommended that if she is concerned over the sudden change and is currently experiencing SOB, she should go to the hospital to be seen and evaluated. [name] agreed and states that she will go to [facility] to be seen. Scheduled for a follow up on 08/22/2020 with PCP.

## 2020-08-10 NOTE — Telephone Encounter (Signed)
Pt states that she is having shortness of breath when she walks and her chest is tight. Transferred to [facility] to triage

## 2020-08-10 NOTE — Telephone Encounter (Signed)
I agree with the hospital or [facility] Urgent care for evaluation

## 2020-08-13 NOTE — Telephone Encounter (Signed)
NOTED

## 2020-08-22 ENCOUNTER — Ambulatory Visit (INDEPENDENT_AMBULATORY_CARE_PROVIDER_SITE_OTHER): Payer: Medicare Other | Admitting: Internal Medicine

## 2020-08-22 ENCOUNTER — Other Ambulatory Visit: Payer: Self-pay

## 2020-08-22 ENCOUNTER — Encounter: Payer: Self-pay | Admitting: Internal Medicine

## 2020-08-22 ENCOUNTER — Telehealth: Payer: Self-pay

## 2020-08-22 VITALS — BP 124/82 | HR 71 | Temp 98.4°F | Ht 65.0 in | Wt 173.0 lb

## 2020-08-22 DIAGNOSIS — I10 Essential (primary) hypertension: Secondary | ICD-10-CM

## 2020-08-22 DIAGNOSIS — E039 Hypothyroidism, unspecified: Secondary | ICD-10-CM | POA: Diagnosis not present

## 2020-08-22 DIAGNOSIS — N3281 Overactive bladder: Secondary | ICD-10-CM | POA: Diagnosis not present

## 2020-08-22 DIAGNOSIS — R1310 Dysphagia, unspecified: Secondary | ICD-10-CM

## 2020-08-22 DIAGNOSIS — R0602 Shortness of breath: Secondary | ICD-10-CM | POA: Diagnosis not present

## 2020-08-22 DIAGNOSIS — M7061 Trochanteric bursitis, right hip: Secondary | ICD-10-CM | POA: Insufficient documentation

## 2020-08-22 MED ORDER — FESOTERODINE FUMARATE ER 8 MG PO TB24: 8.0000 mg | ORAL_TABLET | Freq: Every day | ORAL | 3 refills | Status: DC

## 2020-08-22 MED ORDER — LEVOTHYROXINE SODIUM 175 MCG PO TABS: 175.0000 ug | ORAL_TABLET | Freq: Every day | ORAL | 3 refills | Status: DC
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
## 2020-08-22 NOTE — Telephone Encounter (Signed)
Patient would like to schedule PFT.   Synetta Fail, please advise. Thanks

## 2020-08-22 NOTE — Chronic Care Management (AMB) (Signed)
  Chronic Care Management   Note  08/22/2020 Name: [name] MRN: [id number] DOB: 1956/01/05  [name] is a 64 y.o. year old female who is a primary care patient of McLean-Scocuzza, Nino Glow, MD. I reached out to Rosalee Kaufman by phone today in response to a referral sent by Ms. Michail Sermon Fritsche's PCP, Orland Mustard, MD      Ms. Odonnel was given information about Chronic Care Management services today including:  1. CCM service includes personalized support from designated clinical staff supervised by her physician, including individualized plan of care and coordination with other care providers 2. 24/7 contact phone numbers for assistance for urgent and routine care needs. 3. Service will only be billed when office clinical staff spend 20 minutes or more in a month to coordinate care. 4. Only one practitioner may furnish and bill the service in a calendar month. 5. The patient may stop CCM services at any time (effective at the end of the month) by phone call to the office staff. 6. The patient will be responsible for cost sharing (co-pay) of up to 20% of the service fee (after annual deductible is met).  Patient agreed to services and verbal consent obtained.   Follow up plan: Telephone appointment with care management team member scheduled for:09/24/2020  Noreene Larsson, [place], [place], [place] [address] Direct Dial: [phone] [email or web address]

## 2020-08-22 NOTE — Patient Instructions (Addendum)
08/29/2020 Initial consult Physical Medicine and Rehabilitation Filomena Jungling, MD  [address]  [place], [place] [address]  [phone] (Work)  [phone] (Fax)    12/31/2020 Office Visit Obstetrics and Gynecology Ward, Renee Ramus, MD  [address]  [facility]  [place], [place] [address]  [phone] (Work)  [phone] ([address]    Kathaleen Grinder  [address]  [place], [place] [address]  [phone] (Work)  [phone] (Fax)     Physical Therapy / Physical and Occupational Therapy Diagnoses  Trochanteric bursitis of right hip   Michelene Gardener, PA  Dr. Ernest Pine  [address] [facility] [address] [facility] [address] [facility]  [facility]  [place], [place] [address]  Phone: [phone]  Fax: [phone]   Call above Dr. Ernest Pine to get dates of PT and advise Celebrex 100 mg 2x per is not working   Call to get PT dates    Discuss overactive bladder with ob/gyn Dr. Elesa Massed    Dr. Wyline Mood  [address] [phone]

## 2020-08-24 ENCOUNTER — Other Ambulatory Visit: Payer: Self-pay

## 2020-08-24 ENCOUNTER — Encounter: Payer: Self-pay | Admitting: Emergency Medicine

## 2020-08-24 ENCOUNTER — Emergency Department: Payer: Medicare Other

## 2020-08-24 ENCOUNTER — Emergency Department
Admission: EM | Admit: 2020-08-24 | Discharge: 2020-08-24 | Disposition: A | Discharge status: Home / Self Care | Payer: Medicare Other | Attending: Emergency Medicine | Admitting: Emergency Medicine

## 2020-08-24 DIAGNOSIS — Z7951 Long term (current) use of inhaled steroids: Secondary | ICD-10-CM | POA: Diagnosis not present

## 2020-08-24 DIAGNOSIS — Z8616 Personal history of COVID-19: Secondary | ICD-10-CM | POA: Diagnosis not present

## 2020-08-24 DIAGNOSIS — J45909 Unspecified asthma, uncomplicated: Secondary | ICD-10-CM | POA: Diagnosis not present

## 2020-08-24 DIAGNOSIS — R1314 Dysphagia, pharyngoesophageal phase: Secondary | ICD-10-CM | POA: Diagnosis not present

## 2020-08-24 DIAGNOSIS — R070 Pain in throat: Secondary | ICD-10-CM | POA: Insufficient documentation

## 2020-08-24 DIAGNOSIS — R053 Chronic cough: Secondary | ICD-10-CM | POA: Diagnosis not present

## 2020-08-24 DIAGNOSIS — R079 Chest pain, unspecified: Secondary | ICD-10-CM | POA: Diagnosis not present

## 2020-08-24 DIAGNOSIS — I10 Essential (primary) hypertension: Secondary | ICD-10-CM | POA: Insufficient documentation

## 2020-08-24 DIAGNOSIS — R0602 Shortness of breath: Secondary | ICD-10-CM | POA: Diagnosis not present

## 2020-08-24 DIAGNOSIS — R0789 Other chest pain: Secondary | ICD-10-CM | POA: Diagnosis not present

## 2020-08-24 DIAGNOSIS — Z79899 Other long term (current) drug therapy: Secondary | ICD-10-CM | POA: Diagnosis not present

## 2020-08-24 DIAGNOSIS — E039 Hypothyroidism, unspecified: Secondary | ICD-10-CM | POA: Insufficient documentation

## 2020-08-24 DIAGNOSIS — R1319 Other dysphagia: Secondary | ICD-10-CM

## 2020-08-24 LAB — TROPONIN I (HIGH SENSITIVITY)
Troponin I (High Sensitivity): 3 ng/L (ref <18)
Troponin I (High Sensitivity): 3 ng/L (ref <18)

## 2020-08-24 LAB — BASIC METABOLIC PANEL
Anion gap: 12 (ref 5–15)
BUN: 24 mg/dL — ABNORMAL HIGH (ref 8–23)
CO2: 23 mmol/L (ref 22–32)
Calcium: 10 mg/dL (ref 8.9–10.3)
Chloride: 104 mmol/L (ref 98–111)
Creatinine, Ser: 0.71 mg/dL (ref 0.44–1.00)
GFR, Estimated: >60 mL/min (ref >60)
Glucose, Bld: 152 mg/dL — ABNORMAL HIGH (ref 70–99)
Potassium: 4.1 mmol/L (ref 3.5–5.1)
Sodium: 139 mmol/L (ref 135–145)

## 2020-08-24 LAB — CBC
HCT: 38 % (ref 36.0–46.0)
Hemoglobin: 12.7 g/dL (ref 12.0–15.0)
MCH: 29.2 pg (ref 26.0–34.0)
MCHC: 33.4 g/dL (ref 30.0–36.0)
MCV: 87.4 fL (ref 80.0–100.0)
Platelets: 228 10*3/uL (ref 150–400)
RBC: 4.35 MIL/uL (ref 3.87–5.11)
RDW: 12.8 % (ref 11.5–15.5)
WBC: 7.6 10*3/uL (ref 4.0–10.5)
nRBC: 0 % (ref 0.0–0.2)

## 2020-08-24 MED ORDER — PANTOPRAZOLE SODIUM 40 MG PO TBEC: 40.0000 mg | DELAYED_RELEASE_TABLET | Freq: Every day | ORAL | 0 refills | Status: DC

## 2020-08-24 MED ORDER — LORAZEPAM 0.5 MG PO TABS
1.0000 mg | ORAL_TABLET | Freq: Two times a day (BID) | ORAL | 0 refills | Status: DC | PRN
Start: 2020-08-24 — End: 2020-09-05

## 2020-08-24 MED ORDER — ADVAIR HFA 115-21 MCG/ACT IN AERO: 2.0000 | INHALATION_SPRAY | Freq: Two times a day (BID) | RESPIRATORY_TRACT | 0 refills | Status: DC
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
## 2020-08-24 NOTE — Discharge Instructions (Addendum)
Eat small, soft foods for at least the next several days  Eat frequent, smaller meals with small bites   Try to eat earlier in the day, well before going to bed, and sit upright after each meal  Sleep on 2-3 pillows to help  Take the ATIVAN as needed for severe symptoms  Take the Protonix and Carafate, in addition to your other meds, to help calm any inflammation from acid/help with swallowing  I have prescribed an inhaler that should be on a lower payment tier for your insurance. If it remains expansive, have the pharmacist call Dr. Jayme Cloud for alternatives

## 2020-08-24 NOTE — ED Triage Notes (Signed)
Pt to ED via POV, pt states that she started having chest pain today. Pt has been having issues with swallowing her food for a few months and states that she has seen several doctors for this. Pt states that last night she had an episode where she felt like she was not able to swallow her saliva. Pt reports having some shortness of breath as well. Pt is in NAD. Pt is able to speak in complete sentences at this time.

## 2020-08-28 ENCOUNTER — Telehealth: Payer: Self-pay | Admitting: Pulmonary Disease

## 2020-08-28 NOTE — Telephone Encounter (Signed)
Patient is aware of date/time of covid test prior PFT.   

## 2020-08-29 DIAGNOSIS — M5441 Lumbago with sciatica, right side: Secondary | ICD-10-CM | POA: Diagnosis not present

## 2020-08-29 DIAGNOSIS — G8929 Other chronic pain: Secondary | ICD-10-CM | POA: Diagnosis not present

## 2020-08-29 DIAGNOSIS — M25551 Pain in right hip: Secondary | ICD-10-CM | POA: Diagnosis not present

## 2020-08-30 DIAGNOSIS — Z0120 Encounter for dental examination and cleaning without abnormal findings: Secondary | ICD-10-CM | POA: Diagnosis not present

## 2020-08-31 ENCOUNTER — Other Ambulatory Visit: Payer: Self-pay

## 2020-08-31 ENCOUNTER — Other Ambulatory Visit
Admission: RE | Admit: 2020-08-31 | Discharge: 2020-08-31 | Disposition: A | Discharge status: Home / Self Care | Payer: Medicare Other | Source: Ambulatory Visit | Attending: Pulmonary Disease | Admitting: Pulmonary Disease

## 2020-08-31 DIAGNOSIS — Z20822 Contact with and (suspected) exposure to covid-19: Secondary | ICD-10-CM | POA: Insufficient documentation

## 2020-08-31 DIAGNOSIS — Z01812 Encounter for preprocedural laboratory examination: Secondary | ICD-10-CM | POA: Insufficient documentation

## 2020-09-01 LAB — SARS CORONAVIRUS 2 (TAT 6-24 HRS): SARS Coronavirus 2: NEGATIVE

## 2020-09-03 ENCOUNTER — Other Ambulatory Visit: Payer: Self-pay

## 2020-09-03 ENCOUNTER — Ambulatory Visit: Payer: Medicare Other | Attending: Pulmonary Disease

## 2020-09-03 DIAGNOSIS — R0602 Shortness of breath: Secondary | ICD-10-CM | POA: Insufficient documentation

## 2020-09-03 MED ORDER — ALBUTEROL SULFATE (2.5 MG/3ML) 0.083% IN NEBU
2.5000 mg | INHALATION_SOLUTION | Freq: Once | RESPIRATORY_TRACT | Status: AC
  Administered 2020-09-03: 2.5 mg via RESPIRATORY_TRACT
  Filled 2020-09-03: qty 3

## 2020-09-05 ENCOUNTER — Other Ambulatory Visit: Payer: Self-pay | Admitting: Internal Medicine

## 2020-09-05 DIAGNOSIS — F419 Anxiety disorder, unspecified: Secondary | ICD-10-CM

## 2020-09-05 LAB — PULMONARY FUNCTION TEST ARMC ONLY
DL/VA % pred: 93 %
DL/VA: 3.9 ml/min/mmHg/L
DLCO unc % pred: 99 %
DLCO unc: 20.67 ml/min/mmHg
FEF 25-75 Post: 4.05 L/sec
FEF 25-75 Pre: 2.32 L/sec
FEF2575-%Change-Post: 74 %
FEF2575-%Pred-Post: 195 %
FEF2575-%Pred-Pre: 112 %
FEV1-%Change-Post: 16 %
FEV1-%Pred-Post: 127 %
FEV1-%Pred-Pre: 109 %
FEV1-Post: 2.72 L
FEV1-Pre: 2.33 L
FEV1FVC-%Change-Post: 4 %
FEV1FVC-%Pred-Pre: 100 %
FEV6-%Change-Post: 11 %
FEV6-%Pred-Post: 124 %
FEV6-%Pred-Pre: 111 %
FEV6-Post: 3.26 L
FEV6-Pre: 2.93 L
FEV6FVC-%Pred-Post: 103 %
FEV6FVC-%Pred-Pre: 103 %
FVC-%Change-Post: 11 %
FVC-%Pred-Post: 120 %
FVC-%Pred-Pre: 107 %
FVC-Post: 3.26 L
FVC-Pre: 2.93 L
Post FEV1/FVC ratio: 83 %
Post FEV6/FVC ratio: 100 %
Pre FEV1/FVC ratio: 80 %
Pre FEV6/FVC Ratio: 100 %
RV % pred: 100 %
RV: 2.08 L
TLC % pred: 100 %
TLC: 5.23 L

## 2020-09-05 MED ORDER — LORAZEPAM 1 MG PO TABS: 0.5000 mg | ORAL_TABLET | Freq: Every day | ORAL | 0 refills | Status: DC | PRN

## 2020-09-06 DIAGNOSIS — M25551 Pain in right hip: Secondary | ICD-10-CM | POA: Diagnosis not present

## 2020-09-06 DIAGNOSIS — G8929 Other chronic pain: Secondary | ICD-10-CM | POA: Diagnosis not present

## 2020-09-07 ENCOUNTER — Telehealth: Payer: Self-pay

## 2020-09-07 ENCOUNTER — Telehealth: Payer: Self-pay | Admitting: Internal Medicine

## 2020-09-07 NOTE — Telephone Encounter (Signed)
Patient informed and verbalized understanding

## 2020-09-07 NOTE — Telephone Encounter (Signed)
[employer] called in stated that the patient was approved forLorazepam

## 2020-09-07 NOTE — Telephone Encounter (Signed)
Prior authorization has been submitted for patient's Lorazepam.   Awaiting approval or denial.

## 2020-09-07 NOTE — Telephone Encounter (Signed)
I spoke with [name] and she states that she has the PFT results.  She does need a Rx for some kind of tablet that was given to her when she went to the ED.  The ED doctor gave her the tablet to put under her tongue because she was having trouble swallowing.  She doesn't have anymore tablets.  When she gets home she will call the office back with the name of the pills

## 2020-09-07 NOTE — Telephone Encounter (Signed)
Pt has questions about LORazepam (ATIVAN) 1 MG tablet. She states that she thought it was for relaxation of the throat muscles but pharmacy told her that it needs to be labeled for anxiety for insurance to pay? It looks like it is labeled for anxiety so I am not sure. Please call her back to clarify.

## 2020-09-10 NOTE — Telephone Encounter (Signed)
Called and spoke to pt. Pt states she was given the medication by her PCP. Nothing further is needed at this time.

## 2020-09-10 NOTE — Telephone Encounter (Signed)
Left message to return call 

## 2020-09-18 ENCOUNTER — Other Ambulatory Visit: Payer: Medicare Other

## 2020-09-24 ENCOUNTER — Ambulatory Visit: Payer: Medicare Other | Admitting: Pharmacist

## 2020-09-24 ENCOUNTER — Other Ambulatory Visit: Payer: Medicare Other

## 2020-09-24 DIAGNOSIS — I10 Essential (primary) hypertension: Secondary | ICD-10-CM

## 2020-09-24 DIAGNOSIS — E039 Hypothyroidism, unspecified: Secondary | ICD-10-CM

## 2020-09-24 DIAGNOSIS — Z20822 Contact with and (suspected) exposure to covid-19: Secondary | ICD-10-CM

## 2020-09-24 DIAGNOSIS — R0602 Shortness of breath: Secondary | ICD-10-CM

## 2020-09-24 DIAGNOSIS — R1310 Dysphagia, unspecified: Secondary | ICD-10-CM

## 2020-09-24 DIAGNOSIS — N3281 Overactive bladder: Secondary | ICD-10-CM

## 2020-09-24 MED ORDER — AMLODIPINE BESYLATE 2.5 MG PO TABS: 2.5000 mg | ORAL_TABLET | Freq: Every day | ORAL | 3 refills | Status: DC

## 2020-09-24 NOTE — Patient Instructions (Signed)
[name],   It was great talking to you today!  Be on the lookout for a packet from my pharmacy technician, [name], with the pages of the [employer] application (Symbicort - inhaler) and Pf[employer] (Toviaz - overactive bladder) that I need you to fill out and mail back to her. We will work on getting Dr. Domingo [name] prescriber portion for the Symbicort and Dr. McLean-Scocuzza's for the [name]   Here is what each of your prescribed daily medications is for:   Amlodipine - blood pressure Celecoxib - pain/inflammation Famotidine - acid reflux Omeprazole - acid reflux Levothyroxine - thyroid supplement Lorazepam - anxiety  Toviaz was for the overactive bladder. Let's discuss other options with Dr. Terese [name] moving forward that might be more affordable.    Take care, and call me if you need anything in the meantime!  [name], PharmD [phone]  Visit Information  Patient Care Plan: Medication Management    Problem Identified: Reactive Airway, HTN, Dysphagia, Anxiety     Long-Range Goal: Disease Progression Prevention   This Visit's Progress: On track  Priority: High  Note:   Current Barriers:  . Unable to independently afford treatment regimen . Unable to achieve control of dysphagia  . Low level of health literacy in regard to medication indications  Pharmacist Clinical Goal(s):  [name] Kitchen Over the next 90 days, patient will verbalize ability to afford treatment regimen . achieve adherence to monitoring guidelines and medication adherence to achieve therapeutic efficacy through collaboration with PharmD and provider.   Interventions: . 1:1 collaboration with [name], Jasmine Glow, MD regarding development and update of comprehensive plan of care as evidenced by provider attestation and co-signature . Inter-disciplinary care team collaboration (see longitudinal plan of care) . Comprehensive medication review performed; medication list updated in electronic medical  record  Health Maintenance: . Reports continued evening dysphagia. Does not perceive much benefit from famotidine + omeprazole + lorazepam. GI appt later this month for evaluation . Financial concerns. Rent a significant portion of her SSI income. Discussed referral to Care Guide for SDOH support. Patient amenable.   Hypertension: . Uncontrolled per last clinic reading, goal <130/80; current treatment: none- prescribed amloidpine 2.5 mg daily, but patient never picked up [name] script that day was expensive, patient misunderstood and didn't pick up either prescription)  . Current home readings: none, does not have BP monitor . Reviewed goal BP. Resent amlodipine script for patient. She verbalized understanding and will pick up and start.  Asthma: . Uncontrolled; current treatment: last prescribed generic fluticasone/salmeterol, but reports cost still prohibits taking this regularly . Last PFT: indicates reactive airway disease. Reports worsening when her downstairs neighbor smokes.  . Discussed income. Patient would qualify for Symbicort assistance from [employer]. Will collaborate w/ Dr. Patsey [name] for prescription, with CPhT to mail patient her portions of application.   Dysphagia, ?GERD: . Uncontrolled; currently taking omeprazole 40 mg QPM, famotidine 20 mg QPM. Thought famotidine was for OAB. Also has script for lorazepam 0.5 mg PRN anxiety related to dysphagia episodes.  . Reviewed indications for each medication. Encouraged to keep GI appt later this month.   Hypothyroidism: . Controlled; current regimen: levothyroxine 175 mcg daily.  . Reviewed to separate this medication from other medications, particularly her vitamins/supplements. She verbalized understanding  Bursitis: . Uncontrolled; current treatment: celecoxib 100 mg BID, follows w/ PM&R at [facility]. Appt tomorrow for injections. Reports little benefit from injection previously last month.  . Encouraged continued  collaboration and discussion w/ PM&R regarding goals of  treatment.   OAB: . Uncontrolled; prescribed Toviaz by PCP, but cost was too expensive on insurance. Never picked up.  . Patient appears to qualify for [place] assistance from [employer]. Will collaborate w/ CPhT to mail patient her portion of application, will collaborate w/ PCP on provider portion  Supplement: . Reports use of Vitamin D, Vitamin C, Vitamin B12, Vitamin E, biotin, calcium, melatonin, multivitamin, omega-3-fatty acids, probiotic, turmeric, and "moringa".  . Reviewed lack of evidence of moringa, possible interaction with levothyroxine.    Patient Goals/Self-Care Activities . Over the next 90 days, patient will:  - take medications as prescribed collaborate with provider on medication access solutions  Follow Up Plan: Telephone follow up appointment with care management team member scheduled for: ~ 4 weeks      [name] was given information about Chronic Care Management services today including:  1. CCM service includes personalized support from designated clinical staff supervised by her physician, including individualized plan of care and coordination with other care providers 2. 24/7 contact phone numbers for assistance for urgent and routine care needs. 3. Service will only be billed when office clinical staff spend 20 minutes or more in a month to coordinate care. 4. Only one practitioner may furnish and bill the service in a calendar month. 5. The patient may stop CCM services at any time (effective at the end of the month) by phone call to the office staff. 6. The patient will be responsible for cost sharing (co-pay) of up to 20% of the service fee (after annual deductible is met).  Patient agreed to services and verbal consent obtained.   The patient verbalized understanding of instructions, educational materials, and care plan provided today and agreed to receive a mailed copy of patient instructions,  educational materials, and care plan.   Plan: Telephone follow up appointment with care management team member scheduled for:~ 4 weeks  [name], PharmD, [place], [place] Clinical Pharmacist [employer] [facility] [employer] [phone]

## 2020-09-24 NOTE — Chronic Care Management (AMB) (Signed)
Chronic Care Management   Pharmacy Note  09/24/2020 Name: [name] MRN: [id number] DOB: 06-19-1956  Subjective:  [name] is a 65 y.o. year old female who is a primary care patient of McLean-Scocuzza, Nino Glow, MD. The CCM team was consulted for assistance with chronic disease management and care coordination needs.    Engaged with patient by telephone for initial visit in response to provider referral for pharmacy case management and/or care coordination services.   Consent to Services:  Patient was given the following information about Chronic Care Management services today, agreed to services, and gave verbal consent: 1. CCM service includes personalized support from designated clinical staff supervised by primary care provider, including individualized plan of care and coordination with other care providers 2. 24/7 contact phone numbers for assistance for urgent and routine care needs. 3. Service will only be billed when office clinical staff spend 20 minutes or more in a month to coordinate care. 4. Only one practitioner may furnish and bill the service in a calendar month. 5.The patient may stop CCM services at any time (effective at the end of the month) by phone call to the office staff. 6. The patient will be responsible for cost sharing (co-pay) of up to 20% of the service fee (after annual deductible is met). Patient agreed to services and consent obtained.  Objective:  Lab Results  Component Value Date   CREATININE 0.71 08/24/2020   CREATININE 0.70 10/04/2019   CREATININE 0.60 07/15/2019    Lab Results  Component Value Date   HGBA1C 5.6 11/30/2019       Component Value Date/Time   CHOL 179 11/30/2019 1102   TRIG 101.0 11/30/2019 1102   HDL 61.30 11/30/2019 1102   CHOLHDL 3 11/30/2019 1102   VLDL 20.2 11/30/2019 1102   LDLCALC 97 11/30/2019 1102   Lab Results  Component Value Date   TSH 2.04 02/06/2020     Clinical ASCVD: No  The 10-year ASCVD risk score  Mikey Bussing DC Jr., et al., 2013) is: 4.3%   Values used to calculate the score:     Age: 72 years     Sex: Female     Is Non-Hispanic African American: No     Diabetic: No     Tobacco smoker: No     Systolic Blood Pressure: 431 mmHg     Is BP treated: Yes     HDL Cholesterol: 61.3 mg/dL     Total Cholesterol: 179 mg/dL      BP Readings from Last 3 Encounters:  08/24/20 (!) 145/89  08/22/20 124/82  07/24/20 118/72    Assessment/Interventions: Review of patient past medical history, allergies, medications, health status, including review of consultants reports, laboratory and other test data, was performed as part of comprehensive evaluation and provision of chronic care management services.   SDOH (Social Determinants of Health) assessments and interventions performed:  SDOH Interventions   Flowsheet Row Most Recent Value  SDOH Interventions   Financial Strain Interventions Other (Comment)  [manufacturer assistance, Care Guide referral]       CCM Care Plan  No Known Allergies  Medications Reviewed Today    Reviewed by De Hollingshead, RPH-CPP (Pharmacist) on 09/24/20 at 1252  Med List Status: <None>  Medication Order Taking? Sig Documenting Provider Last Dose Status Informant  albuterol (VENTOLIN HFA) 108 (90 Base) MCG/ACT inhaler [id number] No Inhale 2 puffs into the lungs every 6 (six) hours as needed for wheezing or shortness of breath.  Patient  not taking: Reported on 09/24/2020   McLean-Scocuzza, Nino Glow, MD Not Taking Active   amLODipine (NORVASC) 2.5 MG tablet [id number] No Take 1 tablet (2.5 mg total) by mouth daily.  Patient not taking: Reported on 09/24/2020   McLean-Scocuzza, Nino Glow, MD Not Taking Active   Ascorbic Acid (VITAMIN C PO) [id number] Yes Take by mouth. [provider] Taking Active   BIOTIN PO [id number]  Take by mouth. [provider]  Active   celecoxib (CELEBREX) 100 MG capsule [id number] Yes Take 100 mg by mouth 2 (two) times daily.  [provider] Taking Active   Cyanocobalamin (B-12 PO) [id number] Yes Take by mouth. [provider] Taking Active   cyclobenzaprine (FLEXERIL) 5 MG tablet [id number] No Take 1 tablet (5 mg total) by mouth at bedtime as needed for muscle spasms.  Patient not taking: Reported on 09/24/2020   McLean-Scocuzza, Nino Glow, MD Not Taking Active   famotidine (PEPCID) 20 MG tablet [id number] Yes Take 1 tablet (20 mg total) by mouth at bedtime. McLean-Scocuzza, Nino Glow, MD Taking Active   fesoterodine (TOVIAZ) 8 MG TB24 tablet [id number] No Take 1 tablet (8 mg total) by mouth daily. At bedtime  Patient not taking: Reported on 09/24/2020   McLean-Scocuzza, Nino Glow, MD Not Taking Active   fluticasone [facility]) 50 MCG/ACT nasal spray [id number] No Place 1 spray into both nostrils 2 (two) times daily.  Patient not taking: Reported on 09/24/2020   McLean-Scocuzza, Nino Glow, MD Not Taking Active   fluticasone-salmeterol (ADVAIR [facility]) 115-21 MCG/ACT inhaler [id number] No Inhale 2 puffs into the lungs 2 (two) times daily.  Patient not taking: Reported on 09/24/2020   Duffy Bruce, MD Not Taking Active   levothyroxine (SYNTHROID) 175 MCG tablet [id number] Yes Take 1 tablet (175 mcg total) by mouth daily before breakfast. 30 min before food McLean-Scocuzza, Nino Glow, MD Taking Active   LORazepam (ATIVAN) 1 MG tablet [id number] Yes Take 0.5 tablets (0.5 mg total) by mouth daily as needed for anxiety. McLean-Scocuzza, Nino Glow, MD Taking Active   MELATONIN PO [id number] Yes Take by mouth. [provider] Taking Active   Multiple Vitamins-Minerals (ZINC PO) [id number] Yes Take by mouth. [provider] Taking Active   Omega-3 Fatty Acids (OMEGA 3 PO) [id number] Yes Take 1 capsule by mouth daily. [provider] Taking Active   omeprazole (PRILOSEC) 40 MG capsule [id number] Yes Take 40 mg by mouth daily. [provider] Taking Active   Probiotic Product (PROBIOTIC DAILY PO) [id number]  Yes Take by mouth. [provider] Taking Active   VITAMIN D, CHOLECALCIFEROL, PO [id number] Yes Take 2,000 mg by mouth. [provider] Taking Active           Patient Active Problem List   Diagnosis Date Noted  . Trochanteric bursitis of right hip 08/22/2020  . Anxiety 06/15/2020  . Overactive bladder 06/15/2020  . Bronchospasm 06/15/2020  . Allergic rhinitis 06/15/2020  . Dysphagia 01/25/2020  . H/O bilateral breast implants 10/28/2019  . 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 Essential hypertension 10/28/2019  . Breast pain, left 10/28/2019  . Chronic right hip pain 10/28/2019  . Osteoporosis 10/28/2019  . Shortness of breath 10/28/2019  . S/P lumbar fusion 10/28/2019  . History of colon polyps 10/28/2019  . Hypothyroidism   . Depression     Conditions to be addressed/monitored: asthma, HTN  Patient Care Plan: Medication Management    Problem Identified: Reactive Airway, HTN, Dysphagia, Anxiety  Long-Range Goal: Disease Progression Prevention   This Visit's Progress: On track  Priority: High  Note:   Current Barriers:  . Unable to independently afford treatment regimen . Unable to achieve control of dysphagia  . Low level of health literacy in regard to medication indications  Pharmacist Clinical Goal(s):  Marland Kitchen Over the next 90 days, patient will verbalize ability to afford treatment regimen . achieve adherence to monitoring guidelines and medication adherence to achieve therapeutic efficacy through collaboration with PharmD and provider.   Interventions: . 1:1 collaboration with McLean-Scocuzza, Nino Glow, MD regarding development and update of comprehensive plan of care as evidenced by provider attestation and co-signature . Inter-disciplinary care team collaboration (see longitudinal plan of care) . Comprehensive medication review performed; medication list updated in electronic medical record  Health  Maintenance: . Reports continued evening dysphagia. Does not perceive much benefit from famotidine + omeprazole + lorazepam. GI appt later this month for evaluation . Financial concerns. Rent a significant portion of her SSI income. Discussed referral to Care Guide for SDOH support. Patient amenable.   Hypertension: . Uncontrolled per last clinic reading, goal <130/80; current treatment: none- prescribed amloidpine 2.5 mg daily, but patient never picked up [name] script that day was expensive, patient misunderstood and didn't pick up either prescription)  . Current home readings: none, does not have BP monitor . Reviewed goal BP. Resent amlodipine script for patient. She verbalized understanding and will pick up and start.  Asthma: . Uncontrolled; current treatment: last prescribed generic fluticasone/salmeterol, but reports cost still prohibits taking this regularly . Last PFT: indicates reactive airway disease. Reports worsening when her downstairs neighbor smokes.  . Discussed income. Patient would qualify for Symbicort assistance from [employer]. Will collaborate w/ Dr. Patsey Berthold for prescription, with CPhT to mail patient her portions of application.   Dysphagia, ?GERD: . Uncontrolled; currently taking omeprazole 40 mg QPM, famotidine 20 mg QPM. Thought famotidine was for OAB. Also has script for lorazepam 0.5 mg PRN anxiety related to dysphagia episodes.  . Reviewed indications for each medication. Encouraged to keep GI appt later this month.   Hypothyroidism: . Controlled; current regimen: levothyroxine 175 mcg daily.  . Reviewed to separate this medication from other medications, particularly her vitamins/supplements. She verbalized understanding  Bursitis: . Uncontrolled; current treatment: celecoxib 100 mg BID, follows w/ PM&R at [facility]. Appt tomorrow for injections. Reports little benefit from injection previously last month.  . Encouraged continued collaboration and  discussion w/ PM&R regarding goals of treatment.   OAB: . Uncontrolled; prescribed Toviaz by PCP, but cost was too expensive on insurance. Never picked up.  . Patient appears to qualify for Calvin assistance from [employer]. Will collaborate w/ CPhT to mail patient her portion of application, will collaborate w/ PCP on provider portion  Supplement: . Reports use of Vitamin D, Vitamin C, Vitamin B12, Vitamin E, biotin, calcium, melatonin, multivitamin, omega-3-fatty acids, probiotic, turmeric, and "moringa".  . Reviewed lack of evidence of moringa, possible interaction with levothyroxine.    Patient Goals/Self-Care Activities . Over the next 90 days, patient will:  - take medications as prescribed collaborate with provider on medication access solutions  Follow Up Plan: Telephone follow up appointment with care management team member scheduled for: ~ 4 weeks      Medication Assistance: Application for Symbicort ([place] medication assistance program in process. Anticipated assistance start date TBD. See plan of care above for additional detail.   Plan: Telephone follow up appointment with care management team member  scheduled for:~ 4 weeks  [name], PharmD, [place], [place] Clinical Pharmacist [employer] at [place]

## 2020-09-25 ENCOUNTER — Telehealth: Payer: Self-pay | Admitting: Internal Medicine

## 2020-09-25 NOTE — Telephone Encounter (Signed)
Julia Kluetz 09/25/2020 Called pt regarding community resource referral received. My info is [phone] please see ref notes for more details. Thank you. ° °Julia Kluetz °Care Guide, Embedded Care Coordination °[place], Care Management ° °

## 2020-09-26 ENCOUNTER — Other Ambulatory Visit: Payer: Medicare Other

## 2020-09-26 LAB — SARS-COV-2, NAA 2 DAY TAT

## 2020-09-26 LAB — NOVEL CORONAVIRUS, NAA: SARS-CoV-2, NAA: DETECTED — AB

## 2020-09-27 ENCOUNTER — Other Ambulatory Visit: Payer: Medicare Other

## 2020-09-28 ENCOUNTER — Encounter: Payer: Self-pay | Admitting: Internal Medicine

## 2020-09-28 ENCOUNTER — Other Ambulatory Visit: Payer: Self-pay

## 2020-09-28 ENCOUNTER — Telehealth (INDEPENDENT_AMBULATORY_CARE_PROVIDER_SITE_OTHER): Payer: Medicare Other | Admitting: Internal Medicine

## 2020-09-28 VITALS — Temp 97.9°F | Ht 65.0 in | Wt 157.0 lb

## 2020-09-28 DIAGNOSIS — R1310 Dysphagia, unspecified: Secondary | ICD-10-CM | POA: Diagnosis not present

## 2020-09-28 DIAGNOSIS — M25559 Pain in unspecified hip: Secondary | ICD-10-CM

## 2020-09-28 DIAGNOSIS — N3281 Overactive bladder: Secondary | ICD-10-CM

## 2020-09-28 DIAGNOSIS — U071 COVID-19: Secondary | ICD-10-CM

## 2020-09-28 DIAGNOSIS — G8929 Other chronic pain: Secondary | ICD-10-CM

## 2020-09-28 DIAGNOSIS — R4189 Other symptoms and signs involving cognitive functions and awareness: Secondary | ICD-10-CM

## 2020-09-28 MED ORDER — TROSPIUM CHLORIDE 20 MG PO TABS: 20.0000 mg | ORAL_TABLET | Freq: Every day | ORAL | 3 refills | Status: DC

## 2020-09-28 NOTE — Patient Instructions (Signed)
There is no medication other than over the counter meds: Mucinex dm green label for cough. Vitamin C 1000 mg daily. Vitamin D3 4000 Iu (units) daily. Zinc 100 mg daily. Quercetin 250-500 mg 2 times per day  Monitor pulse ox, buy from [facility] if oxygen is less than 90 please go to the hospital. Penny Pia of [employer]   Are you feeling really sick? Shortness of breath, cough, chest pain? If so let me know  If worsening, go to hospital.
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
## 2020-10-03 NOTE — Progress Notes (Signed)
It is okay to switch 

## 2020-10-05 ENCOUNTER — Telehealth: Payer: Self-pay | Admitting: Internal Medicine

## 2020-10-05 NOTE — Telephone Encounter (Signed)
   Telephone encounter was:  Unsuccessful.  10/05/2020 Name: [name] MRN: [id number] DOB: 01/12/1956  Unsuccessful outbound call made today to assist with:  Transportation Needs , Food Insecurity and income based housing  Outreach Attempt:  3rd Attempt.  Referral closed unable to contact patient.  A HIPAA compliant voice message was left requesting a return call.  Instructed patient to call back at [phone]. Email sent to pt with transportation, food pantries and income based housing. Confirmation of receipt of email. Will reopen a new referral if patient wants to apply for [facility] funding for rental assistance. Closing current referral, pt has current resources.   Rojelio Brenner Care Guide, Embedded Care Coordination [facility], Care Management Phone: [phone] Email: [email or web address]

## 2020-10-09 ENCOUNTER — Other Ambulatory Visit: Payer: Medicare Other

## 2020-10-11 ENCOUNTER — Other Ambulatory Visit: Payer: Medicare Other

## 2020-10-11 ENCOUNTER — Telehealth: Payer: Self-pay | Admitting: Pharmacist

## 2020-10-11 ENCOUNTER — Other Ambulatory Visit: Payer: Self-pay | Admitting: Internal Medicine

## 2020-10-11 MED ORDER — ADVAIR HFA 115-21 MCG/ACT IN AERO
2.0000 | INHALATION_SPRAY | Freq: Two times a day (BID) | RESPIRATORY_TRACT | 11 refills | Status: DC
Start: 2020-10-11 — End: 2020-12-06

## 2020-10-11 MED ORDER — ADVAIR HFA 115-21 MCG/ACT IN AERO: 2.0000 | INHALATION_SPRAY | Freq: Two times a day (BID) | RESPIRATORY_TRACT | 1 refills | Status: DC

## 2020-10-11 MED ORDER — ADVAIR HFA 115-21 MCG/ACT IN AERO
2.0000 | INHALATION_SPRAY | Freq: Two times a day (BID) | RESPIRATORY_TRACT | 1 refills | Status: DC
Start: 2020-10-11 — End: 2020-10-11

## 2020-10-11 NOTE — Addendum Note (Signed)
Addended byElise Benne T on: 10/11/2020 04:31 PM   Modules accepted: Orders

## 2020-10-11 NOTE — Telephone Encounter (Signed)
Received message from CPhT that patient requested a refill on Advair.   Attempted to send electronically, but appears it may have printed, even though I chose Normal. Routing to office staff to help.

## 2020-10-15 ENCOUNTER — Encounter: Payer: Self-pay | Admitting: Gastroenterology

## 2020-10-15 ENCOUNTER — Other Ambulatory Visit: Payer: Self-pay

## 2020-10-15 ENCOUNTER — Ambulatory Visit: Payer: Medicare Other | Admitting: Gastroenterology

## 2020-10-15 VITALS — BP 131/90 | HR 81 | Ht 65.0 in | Wt 171.0 lb

## 2020-10-15 DIAGNOSIS — R1310 Dysphagia, unspecified: Secondary | ICD-10-CM | POA: Diagnosis not present
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
## 2020-10-22 ENCOUNTER — Inpatient Hospital Stay: Admission: RE | Admit: 2020-10-22 | Payer: Medicare Other | Source: Ambulatory Visit

## 2020-10-24 ENCOUNTER — Encounter: Payer: Self-pay | Admitting: Gastroenterology

## 2020-10-24 ENCOUNTER — Ambulatory Visit: Payer: Medicare Other | Admitting: Anesthesiology

## 2020-10-24 ENCOUNTER — Other Ambulatory Visit: Payer: Self-pay

## 2020-10-24 ENCOUNTER — Ambulatory Visit
Admission: RE | Admit: 2020-10-24 | Discharge: 2020-10-24 | Disposition: A | Discharge status: Home / Self Care | Payer: Medicare Other | Attending: Gastroenterology | Admitting: Gastroenterology

## 2020-10-24 ENCOUNTER — Encounter
Admission: RE | Disposition: A | Discharge status: Home / Self Care | Payer: Self-pay | Source: Home / Self Care | Attending: Gastroenterology

## 2020-10-24 DIAGNOSIS — B3781 Candidal esophagitis: Secondary | ICD-10-CM | POA: Diagnosis not present

## 2020-10-24 DIAGNOSIS — Z8616 Personal history of COVID-19: Secondary | ICD-10-CM | POA: Insufficient documentation

## 2020-10-24 DIAGNOSIS — K229 Disease of esophagus, unspecified: Secondary | ICD-10-CM | POA: Diagnosis not present

## 2020-10-24 DIAGNOSIS — Z7951 Long term (current) use of inhaled steroids: Secondary | ICD-10-CM | POA: Insufficient documentation

## 2020-10-24 DIAGNOSIS — R1310 Dysphagia, unspecified: Secondary | ICD-10-CM

## 2020-10-24 DIAGNOSIS — Z791 Long term (current) use of non-steroidal anti-inflammatories (NSAID): Secondary | ICD-10-CM | POA: Diagnosis not present

## 2020-10-24 HISTORY — PX: ESOPHAGOGASTRODUODENOSCOPY (EGD) WITH PROPOFOL: SHX5813

## 2020-10-24 LAB — KOH PREP

## 2020-10-24 SURGERY — ESOPHAGOGASTRODUODENOSCOPY (EGD) WITH PROPOFOL
Anesthesia: General

## 2020-10-24 MED ORDER — PROPOFOL 10 MG/ML IV BOLUS
INTRAVENOUS | Status: DC | PRN
  Administered 2020-10-24: 80 mg via INTRAVENOUS

## 2020-10-24 MED ORDER — PROPOFOL 10 MG/ML IV BOLUS
INTRAVENOUS | Status: AC
  Filled 2020-10-24: qty 20

## 2020-10-24 MED ORDER — LIDOCAINE HCL (CARDIAC) PF 100 MG/5ML IV SOSY
PREFILLED_SYRINGE | INTRAVENOUS | Status: DC | PRN
  Administered 2020-10-24: 50 mg via INTRAVENOUS

## 2020-10-24 MED ORDER — SODIUM CHLORIDE 0.9 % IV SOLN
INTRAVENOUS | Status: DC
  Administered 2020-10-24: 1000 mL via INTRAVENOUS

## 2020-10-24 MED ORDER — DEXMEDETOMIDINE (PRECEDEX) IN NS 20 MCG/5ML (4 MCG/ML) IV SYRINGE
PREFILLED_SYRINGE | INTRAVENOUS | Status: AC
  Filled 2020-10-24: qty 5

## 2020-10-24 NOTE — Op Note (Signed)
[facility] Gastroenterology Patient Name: [name] Procedure Date: 10/24/2020 11:51 AM MRN: [id number] Account #: [id number] Date of Birth: 02-Oct-1955 Admit Type: Outpatient Age: 65 Room: [facility] ENDO ROOM 3 Gender: Female Note Status: Finalized Procedure:             Upper GI endoscopy Indications:           Dysphagia Providers:             Wyline Mood MD, MD Referring MD:          Pasty Spillers Mclean-Scocuzza MD, MD (Referring MD) Medicines:             Monitored Anesthesia Care Complications:         No immediate complications. Procedure:             Pre-Anesthesia Assessment:                        - Prior to the procedure, a History and Physical was                         performed, and patient medications, allergies and                         sensitivities were reviewed. The patient's tolerance                         of previous anesthesia was reviewed.                        - The risks and benefits of the procedure and the                         sedation options and risks were discussed with the                         patient. All questions were answered and informed                         consent was obtained.                        - ASA Grade Assessment: II - A patient with mild                         systemic disease.                        After obtaining informed consent, the endoscope was                         passed under direct vision. Throughout the procedure,                         the patient's blood pressure, pulse, and oxygen                         saturations were monitored continuously. The Endoscope                         was introduced through the mouth, and  advanced to the                         third part of duodenum. The upper GI endoscopy was                         accomplished with ease. The patient tolerated the                         procedure well. Findings:      The stomach was normal.      The examined duodenum was  normal.      The cardia and gastric fundus were normal on retroflexion.      Patchy, white plaques were found in the upper third of the esophagus and       in the middle third of the esophagus. Brushings for KOH prep were       obtained in the upper third of the esophagus. Biopsies were taken with a       cold forceps for histology. Impression:            - Normal stomach.                        - Normal examined duodenum.                        - Esophageal plaques were found, suspicious for                         candidiasis. Brushings performed. Biopsied. Recommendation:        - Discharge patient to home (with escort).                        - Resume previous diet.                        - Continue present medications.                        - Await pathology results.                        - Return to my office as previously scheduled. Procedure Code(s):     --- Professional ---                        [phone], Esophagogastroduodenoscopy, flexible,                         transoral; with biopsy, single or multiple Diagnosis Code(s):     --- Professional ---                        K22.9, Disease of esophagus, unspecified                        R13.10, Dysphagia, unspecified CPT copyright 2019 American Medical Association. All rights reserved. The codes documented in this report are preliminary and upon coder review may  be revised to meet current compliance requirements. Wyline Mood, MD Wyline Mood MD, MD 10/24/2020 12:14:43 PM This report has been signed electronically. Number of Addenda: 0 Note Initiated  On: 10/24/2020 11:51 AM Estimated Blood Loss:  Estimated blood loss: none.      [facility]
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
## 2020-10-24 NOTE — Anesthesia Procedure Notes (Signed)
Procedure Name: MAC Date/Time: 10/24/2020 12:04 PM Performed by: Jerrye Noble, CRNA Pre-anesthesia Checklist: Patient identified, Emergency Drugs available, Suction available and Patient being monitored Patient Re-evaluated:Patient Re-evaluated prior to induction Oxygen Delivery Method: Nasal cannula

## 2020-10-24 NOTE — Anesthesia Postprocedure Evaluation (Signed)
Anesthesia Post Note  Patient: AMELIANNA MELLER  Procedure(s) Performed: ESOPHAGOGASTRODUODENOSCOPY (EGD) WITH PROPOFOL (N/A )  Patient location during evaluation: Endoscopy Anesthesia Type: General Level of consciousness: awake Pain management: pain level controlled Vital Signs Assessment: post-procedure vital signs reviewed and stable Respiratory status: spontaneous breathing Cardiovascular status: blood pressure returned to baseline and stable Postop Assessment: no apparent nausea or vomiting Anesthetic complications: no   No complications documented.   Last Vitals:  Vitals:   10/24/20 1228 10/24/20 1238  BP: 110/87 (!) 154/91  Pulse: 74 67  Resp: (!) 24 11  Temp:    SpO2: 97% 98%    Last Pain:  Vitals:   10/24/20 1238  PainSc: 0-No pain                 Emilio Math
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
## 2020-10-24 NOTE — Transfer of Care (Signed)
Immediate Anesthesia Transfer of Care Note  Patient: [name]  Procedure(s) Performed: ESOPHAGOGASTRODUODENOSCOPY (EGD) WITH PROPOFOL (N/A )  Patient Location: PACU and Endoscopy Unit  Anesthesia Type:General  Level of Consciousness: drowsy  Airway & Oxygen Therapy: Patient Spontanous Breathing  Post-op Assessment: Report given to RN and Post -op Vital signs reviewed and stable  Post vital signs: Reviewed and stable  Last Vitals:  Vitals Value Taken Time  BP 106/73 10/24/20 1218  Temp    Pulse 76 10/24/20 1218  Resp 18 10/24/20 1218  SpO2 96 % 10/24/20 1218    Last Pain: There were no vitals filed for this visit.    Patients Stated Pain Goal: 0 (10/24/20 1113)  Complications: No complications documented.

## 2020-10-25 ENCOUNTER — Encounter: Payer: Self-pay | Admitting: Gastroenterology

## 2020-10-25 ENCOUNTER — Telehealth: Payer: Self-pay | Admitting: Internal Medicine

## 2020-10-25 NOTE — Telephone Encounter (Signed)
Pt called she wanted to know what gynecologist Dr. French Ana wanted her to see

## 2020-10-25 NOTE — Telephone Encounter (Signed)
Reason for Referral: pap needed Dr. Kari Baars Ward  [facility] ob/gyn-call for appt    [facility] ob/gyn Dr. Bonney Aid call for appt give pt #s of both   Thanks

## 2020-10-25 NOTE — Telephone Encounter (Signed)
Please advise 

## 2020-10-26 ENCOUNTER — Other Ambulatory Visit: Payer: Self-pay

## 2020-10-26 NOTE — Telephone Encounter (Signed)
If worsening rec [facility]

## 2020-10-26 NOTE — Telephone Encounter (Signed)
Patient informed and verbalized understanding.  Patient having chest tightness and headaches. Scheduled to come in 10/30/20 screened.

## 2020-10-29 ENCOUNTER — Ambulatory Visit (INDEPENDENT_AMBULATORY_CARE_PROVIDER_SITE_OTHER): Payer: Medicare Other | Admitting: Pharmacist

## 2020-10-29 DIAGNOSIS — I10 Essential (primary) hypertension: Secondary | ICD-10-CM | POA: Diagnosis not present

## 2020-10-29 DIAGNOSIS — R1310 Dysphagia, unspecified: Secondary | ICD-10-CM

## 2020-10-29 DIAGNOSIS — E039 Hypothyroidism, unspecified: Secondary | ICD-10-CM

## 2020-10-29 DIAGNOSIS — K219 Gastro-esophageal reflux disease without esophagitis: Secondary | ICD-10-CM

## 2020-10-29 DIAGNOSIS — R0602 Shortness of breath: Secondary | ICD-10-CM

## 2020-10-29 DIAGNOSIS — N3281 Overactive bladder: Secondary | ICD-10-CM

## 2020-10-29 LAB — SURGICAL PATHOLOGY

## 2020-10-29 NOTE — Chronic Care Management (AMB) (Signed)
Chronic Care Management Pharmacy Note  10/29/2020 Name:  [name] MRN:  [id number] DOB:  September 07, 1956  Subjective: [name] is an 65 y.o. year old female who is a primary patient of McLean-Scocuzza, Pasty Spillers, MD.  The CCM team was consulted for assistance with disease management and care coordination needs.    Engaged with patient by telephone for follow up visit in response to provider referral for pharmacy case management and/or care coordination services.   Consent to Services:  The patient was given information about Chronic Care Management services, agreed to services, and gave verbal consent prior to initiation of services.  Please see initial visit note for detailed documentation.   Objective:  Lab Results  Component Value Date   CREATININE 0.71 08/24/2020   CREATININE 0.70 10/04/2019   CREATININE 0.60 07/15/2019    Lab Results  Component Value Date   HGBA1C 5.6 11/30/2019       Component Value Date/Time   CHOL 179 11/30/2019 1102   TRIG 101.0 11/30/2019 1102   HDL 61.30 11/30/2019 1102   CHOLHDL 3 11/30/2019 1102   VLDL 20.2 11/30/2019 1102   LDLCALC 97 11/30/2019 1102   Lab Results  Component Value Date   TSH 2.04 02/06/2020    Clinical ASCVD: No  The 10-year ASCVD risk score Denman George DC Jr., et al., 2013) is: 6.8%   Values used to calculate the score:     Age: 61 years     Sex: Female     Is Non-Hispanic African American: No     Diabetic: No     Tobacco smoker: No     Systolic Blood Pressure: 154 mmHg     Is BP treated: Yes     HDL Cholesterol: 61.3 mg/dL     Total Cholesterol: 179 mg/dL      BP Readings from Last 3 Encounters:  10/24/20 (!) 154/91  10/15/20 131/90  08/24/20 (!) 145/89    Assessment: Review of patient past medical history, allergies, medications, health status, including review of consultants reports, laboratory and other test data, was performed as part of comprehensive evaluation and provision of chronic care management  services.   SDOH:  (Social Determinants of Health) assessments and interventions performed:  SDOH Interventions   Flowsheet Row Most Recent Value  SDOH Interventions   Financial Strain Interventions Other (Comment)  [manufacturer assistance]      CCM Care Plan  No Known Allergies  Medications Reviewed Today    Reviewed by Lourena Simmonds, RPH-CPP (Pharmacist) on 10/29/20 at 1530  Med List Status: <None>  Medication Order Taking? Sig Documenting Provider Last Dose Status Informant  albuterol (VENTOLIN HFA) 108 (90 Base) MCG/ACT inhaler [id number] Yes Inhale 2 puffs into the lungs every 6 (six) hours as needed for wheezing or shortness of breath. McLean-Scocuzza, Pasty Spillers, MD Taking Active   amLODipine (NORVASC) 2.5 MG tablet [id number] Yes Take 1 tablet (2.5 mg total) by mouth daily. McLean-Scocuzza, Pasty Spillers, MD Taking Active   APPLE CIDER VINEGAR PO [id number] Yes Take 1 tablet by mouth daily. [provider] Taking Active   Ascorbic Acid (VITAMIN C PO) [id number] Yes Take 1,000 mg by mouth daily. [provider] Taking Active   aspirin 81 MG chewable tablet [id number] Yes Chew by mouth daily. [provider] Taking Active   Calcium Carbonate-Vit D-Min (CALCIUM 1200 PO) [id number] Yes Take by mouth. [provider] Taking Active   Cyanocobalamin (B-12 PO) [id number] Yes Take 1 tablet by mouth daily.  [provider] Taking Active   cyclobenzaprine (FLEXERIL) 5 MG tablet [id number] Yes Take 1 tablet (5 mg total) by mouth at bedtime as needed for muscle spasms. McLean-Scocuzza, Pasty Spillers, MD Taking Active   famotidine (PEPCID) 20 MG tablet [id number] Yes Take 1 tablet (20 mg total) by mouth at bedtime. McLean-Scocuzza, Pasty Spillers, MD Taking Active   fluticasone-salmeterol (ADVAIR [facility]) 151-76 MCG/ACT inhaler [id number] Yes Inhale 2 puffs into the lungs 2 (two) times daily. Rinse mouth McLean-Scocuzza, Pasty Spillers, MD Taking Active   levothyroxine (SYNTHROID) 175  MCG tablet [id number] Yes Take 1 tablet (175 mcg total) by mouth daily before breakfast. 30 min before food McLean-Scocuzza, Pasty Spillers, MD Taking Active   LORazepam (ATIVAN) 1 MG tablet [id number] Yes Take 0.5 tablets (0.5 mg total) by mouth daily as needed for anxiety. McLean-Scocuzza, Pasty Spillers, MD Taking Active   MELATONIN PO [id number] Yes Take 1 tablet by mouth daily. [provider] Taking Active   Multiple Vitamins-Minerals (ZINC PO) [id number] Yes Take by mouth. [provider] Taking Active   Omega-3 Fatty Acids (OMEGA 3 PO) [id number] Yes Take 1 capsule by mouth daily. [provider] Taking Active   omeprazole (PRILOSEC) 40 MG capsule [id number] Yes Take 40 mg by mouth daily. [provider] Taking Active            Med Note Ara Kussmaul Oct 29, 2020  3:22 PM) Taking BID  Probiotic Product (PROBIOTIC DAILY PO) [id number] Yes Take by mouth. [provider] Taking Active   trospium (SANCTURA) 20 MG tablet [id number] Yes Take 1 tablet (20 mg total) by mouth at bedtime. McLean-Scocuzza, Pasty Spillers, MD Taking Active   TURMERIC-GINGER PO [id number] Yes Take 1 each by mouth daily. [provider] Taking Active   VITAMIN D, CHOLECALCIFEROL, PO [id number] Yes Take 2,000 mg by mouth. [provider] Taking Active           Patient Active Problem List   Diagnosis Date Noted  . Brain fog 09/28/2020  . Trochanteric bursitis of right hip 08/22/2020  . Anxiety 06/15/2020  . Overactive bladder 06/15/2020  . Bronchospasm 06/15/2020  . Allergic rhinitis 06/15/2020  . Dysphagia 01/25/2020  . H/O bilateral breast implants 10/28/2019  . 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 Essential hypertension 10/28/2019  . Breast pain, left 10/28/2019  . Chronic right hip pain 10/28/2019  . Osteoporosis 10/28/2019  . Shortness of breath 10/28/2019  . S/P lumbar fusion 10/28/2019  . History of colon polyps 10/28/2019   . Hypothyroidism   . Depression     Conditions to be addressed/monitored: HTN and hypothyroidism, asthma, osteoporosis  Care Plan : Medication Management  Updates made by Lourena Simmonds, RPH-CPP since 10/29/2020 12:00 AM    Problem: Reactive Airway, HTN, Dysphagia, Anxiety     Long-Range Goal: Disease Progression Prevention   Start Date: 10/29/2020  This Visit's Progress: On track  Recent Progress: On track  Priority: High  Note:   Current Barriers:  . Unable to independently afford treatment regimen . Unable to achieve control of dysphagia  . Low level of health literacy in regard to medication indications  Pharmacist Clinical Goal(s):  Marland Kitchen Over the next 90 days, patient will verbalize ability to afford treatment regimen . achieve adherence to monitoring guidelines and medication adherence to achieve therapeutic efficacy through collaboration with PharmD and provider.   Interventions: . 1:1 collaboration with McLean-Scocuzza, Pasty Spillers, MD regarding development and update of  comprehensive plan of care as evidenced by provider attestation and co-signature . Inter-disciplinary care team collaboration (see longitudinal plan of care) . Comprehensive medication review performed; medication list updated in electronic medical record  Health Maintenance: . Report concerns about esophageal thrush found on endoscopy. Noted by Dr. Tobi Bastos that fluconazole was to be prescribed, I do not see where prescription was sent in. Patient is concerned about etiology of thrush and connection to inhaler use (though reports she had not been rinsing her mouth out after use of ICS/LABA inhaler previously)  Hypertension: . Uncontrolled per last clinic reading, goal <130/80; current treatment: amlodipine 2.5 mg daily  . Current home readings: none, does not have BP monitor; wonders why she needs antihypertensive therapy . Reviewed that amlodipine was started when HCTZ was stopped (d/t polyuria). Recommended  patient to continue current medication at this time. Follow up with PCP tomorrow.  Marland Kitchen Appointment tomorrow w/ PCP for "chest tightness". Notes that her neighbor advised her to seek emergency care, but episodes sometimes occurred after eating, so she wasn't sure it was cardiac in nature. She has been taking a baby aspirin daily for 3 days. Reviewed that if she has recurrent chest discomfort overnight to seek immediate emergency care.   Asthma/Reactive Airway disease: Marland Kitchen Uncontrolled; current treatment: last prescribed generic fluticasone/salmeterol, but reports cost still prohibits taking this regularly. Working on patient assistance for Symbicort w/ pulmonary Dr/ Jayme Cloud . Reports that breathing concerns are only exacerbated by her downstairs neighbor smoking.  . Last PFT: indicates reactive airway disease.  . Patient will bring paperwork for Symbicort assistance through [place] to clinic tomorrow. PRN use of Symbicort w/ symptoms (along with rinsing her mouth out after use) vs daily Advair dosing may provide a better balance of efficacy with less side effects. Could also consider montelukast moving forward. Continue collaboration with pulmonary.   Dysphagia, ?GERD: . Uncontrolled; currently taking omeprazole 40 mg BID, famotidine 20 mg QPM. S/p endoscopy w/ Dr. Tobi Bastos. She reports he told her to take omeprazole BID . Will outreach Dr. Tobi Bastos to review intended treatment regimen for patient. May depend upon resolution of dysphagia symptoms s/p treatment of esophageal candida . Continue current regimen at this time. Continue follow up with GI.  Hypothyroidism: . Controlled per last TSH; current regimen: levothyroxine 175 mcg daily.  . Reviewed to separate this medication from other medications, particularly her vitamins/supplements.   Bursitis: . Uncontrolled; current treatment: none, reports she is out of celecoxib 100 mg BID, follows w/ PM&R at [facility].  . Encouraged continued collaboration and  discussion w/ PM&R regarding goals of treatment. Will follow for appropriate treatment moving forward.   OAB: . Uncontrolled; prescribed Toviaz by PCP, but cost was too expensive on insurance. Never picked up. Last prescribed trospium by PCP, but patient denies benefit and reports constant dry mouth, however, unclear if this was exacerbated by trospium . Working on patient assistance for [employer] through [employer]. Patient to bring her portion of paperwork to office tomorrow.   Supplement: . Reports use of Vitamin D 5000 units daily, Vitamin C, Vitamin B12, Vitamin E, calcium 1200 mg daily, melatonin, multivitamin, omega-3-fatty acids, probiotic, turmeric, and "moringa"  . Patient notes she is unsure why she is taking some of the supplements that she is, and that she often purchases things if they note benefit in diseases that she has. Reviewed lack of marketing oversight on supplements and to be wary of claims that are not supported by evidence. Encouraged to discuss w/ PCP  tomorrow which supplements she "should" be taking  . Reviewed that calcium ~400-600 mg daily is likely appropriate, and to purchase calcium citrate formulation d/t concurrent PPI use   Patient Goals/Self-Care Activities . Over the next 90 days, patient will:  - take medications as prescribed collaborate with provider on medication access solutions  Follow Up Plan: Telephone follow up appointment with care management team member scheduled for: ~ 4 weeks      Medication Assistance: Application for Toviaz [employer]) and Symbicort ([place]  medication assistance [place]. in process.  Anticipated assistance start date TBD.  See plan of care for additional detail.  Follow Up:  Patient agrees to Care Plan and Follow-up.  Plan: Telephone follow up appointment with care management team member scheduled for:  ~ 6 weeks  [name], PharmD, [place], CPP Clinical Pharmacist [employer] at [employer] [phone]

## 2020-10-29 NOTE — Patient Instructions (Signed)
Visit Information  PATIENT GOALS:  Goals Addressed               This Visit's Progress     Patient Stated     Medication Monitoring (pt-stated)        Patient Goals/Self-Care Activities Over the next 90 days, patient will:  - take medications as prescribed collaborate with provider on medication access solutions        Patient verbalizes understanding of instructions provided today and agrees to view in MyChart.   Plan: Telephone follow up appointment with care management team member scheduled for:  ~ 6 weeks  Catie Vivion Romano, PharmD, BCACP, CPP Clinical Pharmacist [facility] at [place] [phone] 

## 2020-10-29 NOTE — Telephone Encounter (Signed)
Patient informed during phone call last week.

## 2020-10-30 ENCOUNTER — Encounter: Payer: Self-pay | Admitting: Internal Medicine

## 2020-10-30 ENCOUNTER — Encounter: Payer: Self-pay | Admitting: Gastroenterology

## 2020-10-30 ENCOUNTER — Other Ambulatory Visit: Payer: Self-pay

## 2020-10-30 ENCOUNTER — Ambulatory Visit (INDEPENDENT_AMBULATORY_CARE_PROVIDER_SITE_OTHER): Payer: Medicare Other | Admitting: Internal Medicine

## 2020-10-30 VITALS — BP 104/78 | HR 73 | Temp 98.3°F | Ht 65.0 in | Wt 171.6 lb

## 2020-10-30 DIAGNOSIS — J4530 Mild persistent asthma, uncomplicated: Secondary | ICD-10-CM

## 2020-10-30 DIAGNOSIS — R519 Headache, unspecified: Secondary | ICD-10-CM | POA: Diagnosis not present

## 2020-10-30 DIAGNOSIS — F339 Major depressive disorder, recurrent, unspecified: Secondary | ICD-10-CM

## 2020-10-30 DIAGNOSIS — G4700 Insomnia, unspecified: Secondary | ICD-10-CM | POA: Diagnosis not present

## 2020-10-30 DIAGNOSIS — R0789 Other chest pain: Secondary | ICD-10-CM | POA: Diagnosis not present

## 2020-10-30 DIAGNOSIS — E039 Hypothyroidism, unspecified: Secondary | ICD-10-CM

## 2020-10-30 DIAGNOSIS — I10 Essential (primary) hypertension: Secondary | ICD-10-CM | POA: Diagnosis not present

## 2020-10-30 DIAGNOSIS — Z114 Encounter for screening for human immunodeficiency virus [HIV]: Secondary | ICD-10-CM | POA: Diagnosis not present

## 2020-10-30 DIAGNOSIS — Z1389 Encounter for screening for other disorder: Secondary | ICD-10-CM

## 2020-10-30 DIAGNOSIS — F419 Anxiety disorder, unspecified: Secondary | ICD-10-CM

## 2020-10-30 DIAGNOSIS — K209 Esophagitis, unspecified without bleeding: Secondary | ICD-10-CM

## 2020-10-30 DIAGNOSIS — R0683 Snoring: Secondary | ICD-10-CM

## 2020-10-30 LAB — HEPATIC FUNCTION PANEL
ALT: 27 U/L (ref 0–35)
AST: 26 U/L (ref 0–37)
Albumin: 4.3 g/dL (ref 3.5–5.2)
Alkaline Phosphatase: 73 U/L (ref 39–117)
Bilirubin, Direct: 0.1 mg/dL (ref 0.0–0.3)
Total Bilirubin: 0.5 mg/dL (ref 0.2–1.2)
Total Protein: 7 g/dL (ref 6.0–8.3)

## 2020-10-30 LAB — LIPID PANEL
Cholesterol: 189 mg/dL (ref 0–200)
HDL: 63.7 mg/dL (ref >39.00)
LDL Cholesterol: 90 mg/dL (ref 0–99)
NonHDL: 124.94
Total CHOL/HDL Ratio: 3
Triglycerides: 173 mg/dL — ABNORMAL HIGH (ref 0.0–149.0)
VLDL: 34.6 mg/dL (ref 0.0–40.0)

## 2020-10-30 LAB — TSH: TSH: 2.69 u[IU]/mL (ref 0.35–4.50)

## 2020-10-30 MED ORDER — TRAZODONE HCL 50 MG PO TABS: 50.0000 mg | ORAL_TABLET | Freq: Every evening | ORAL | 0 refills | Status: DC | PRN

## 2020-10-30 MED ORDER — FLUCONAZOLE 200 MG PO TABS: 200.0000 mg | ORAL_TABLET | Freq: Every day | ORAL | 3 refills | Status: DC
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
## 2020-10-30 NOTE — Patient Instructions (Addendum)
Use albuterol inhaler for chest tightness as 09/03/20 breathing study shows mild persistent asthma   Reduce caffeine if drinking  Magnesium 250 mg over the counter daily  Visit the dentist    Idiopathic guttate hypomelanosis -web MD or [facility]   [employer] consider this in the future scholarship program   Vitamin D3 1000 daily at night calcium 1200 mg daily at night    Tylenol as needed for headache   11/23/2020 Initial consult Neurology [name], Jasmine Kalpeshkumar, MD  [address]  [facility]  [place], [place] [address]  [phone] (Work)  [phone] (Fax)    12/31/2020 Office Visit Obstetrics and Gynecology Jasmine, Renee Ramushelsea Crist, MD  [address]  [facility]  [place], [place] [address]  [phone] (Work)  [phone] ([address]Fax)    Jasmine GrinderMackie, [name]  [address]  [place], [place] [address]  [phone] (Work)  [phone] (Fax)      Jasmine JunglingJennifer Morales, MD (Attending)-Hip [name] call for appt [phone] (Work) [phone] (Fax) [address] [place], [place] [address] Physical Medicine and Rehabilitation   Asthma, Adult  Asthma is a long-term (chronic) condition in which the airways get tight and narrow. The airways are the breathing passages that lead from the nose and mouth down into the lungs. A person with asthma will have times when symptoms get worse. These are called asthma attacks. They can cause coughing, whistling sounds when you breathe (wheezing), shortness of breath, and chest pain. They can make it hard to breathe. There is no cure for asthma, but medicines and lifestyle changes can help control it. There are many things that can bring on an asthma attack or make asthma symptoms worse (triggers). Common triggers include:  Mold.  Dust.  Cigarette smoke.  Cockroaches.  Things that can cause allergy symptoms (allergens). These include animal skin flakes (dander) and pollen  from trees or grass.  Things that pollute the air. These may include household cleaners, wood smoke, smog, or chemical odors.  Cold air, weather changes, and wind.  Crying or laughing hard.  Stress.  Certain medicines or drugs.  Certain foods such as dried fruit, potato chips, and grape juice.  Infections, such as a cold or the flu.  Certain medical conditions or diseases.  Exercise or tiring activities. Asthma may be treated with medicines and by staying away from the things that cause asthma attacks. Types of medicines may include:  Controller medicines. These help prevent asthma symptoms. They are usually taken every day.  Fast-acting reliever or rescue medicines. These quickly relieve asthma symptoms. They are used as needed and provide short-term relief.  Allergy medicines if your attacks are brought on by allergens.  Medicines to help control the body's defense (immune) system. Follow these instructions at home: Avoiding triggers in your home  Change your heating and air conditioning filter often.  Limit your use of fireplaces and wood stoves.  Get rid of pests (such as roaches and mice) and their droppings.  Throw away plants if you see mold on them.  Clean your floors. Dust regularly. Use cleaning products that do not smell.  Have someone vacuum when you are not home. Use a vacuum cleaner with a HEPA filter if possible.  Replace carpet with wood, tile, or vinyl flooring. Carpet can trap animal skin flakes and dust.  Use allergy-proof pillows, mattress covers, and box spring covers.  Wash bed sheets and blankets every week in hot water. Dry them in a dryer.  Keep your  bedroom free of any triggers.  Avoid pets and keep windows closed when things that cause allergy symptoms are in the air.  Use blankets that are made of polyester or cotton.  Clean bathrooms and kitchens with bleach. If possible, have someone repaint the walls in these rooms with  mold-resistant paint. Keep out of the rooms that are being cleaned and painted.  Wash your hands often with soap and water. If soap and water are not available, use hand sanitizer.  Do not allow anyone to smoke in your home. General instructions  Take over-the-counter and prescription medicines only as told by your [name]. ? Talk with your [name] if you have questions about how or when to take your medicines. ? Make note if you need to use your medicines more often than usual.  Do not use any products that contain nicotine or tobacco, such as cigarettes and e-cigarettes. If you need help quitting, ask your [name].  Stay away from secondhand smoke.  Avoid doing things outdoors when allergen counts are high and when air quality is low.  Wear a ski mask when doing outdoor activities in the winter. The mask should cover your nose and mouth. Exercise indoors on cold days if you can.  Warm up before you exercise. Take time to cool down after exercise.  Use a peak flow meter as told by your [name]. A peak flow meter is a tool that measures how well the lungs are working.  Keep track of the peak flow meter's readings. Write them down.  Follow your asthma action plan. This is a written plan for taking care of your asthma and treating your attacks.  Make sure you get all the shots (vaccines) that your [name] recommends. Ask your [name] about a flu shot and a pneumonia shot.  Keep all follow-up visits as told by your [name]. This is important. Contact a [name] if:  You have wheezing, shortness of breath, or a cough even while taking medicine to prevent attacks.  The mucus you cough up (sputum) is thicker than usual.  The mucus you cough up changes from clear or white to yellow, green, gray, or bloody.  You have problems from the medicine you are taking, such as: ? A rash. ? Itching. ? Swelling. ? Trouble breathing.  You need reliever medicines more than 2-3 times a week.  Your peak  flow reading is still at 50-79% of your personal best after following the action plan for 1 hour.  You have a fever. Get help right away if:  You seem to be worse and are not responding to medicine during an asthma attack.  You are short of breath even at rest.  You get short of breath when doing very little activity.  You have trouble eating, drinking, or talking.  You have chest pain or tightness.  You have a fast heartbeat.  Your lips or fingernails start to turn blue.  You are light-headed or dizzy, or you faint.  Your peak flow is less than 50% of your personal best.  You feel too tired to breathe normally. Summary  Asthma is a long-term (chronic) condition in which the airways get tight and narrow. An asthma attack can make it hard to breathe.  Asthma cannot be cured, but medicines and lifestyle changes can help control it.  Make sure you understand how to avoid triggers and how and when to use your medicines. This information is not intended to replace advice given to you by your health  care provider. Make sure you discuss any questions you have with your health care provider. Document Revised: 01/11/2020 Document Reviewed: 01/11/2020 Elsevier Patient Education  2021 Elsevier Inc.     Oral Thrush, Adult Oral thrush, also called oral candidiasis, is a fungal infection that develops in the mouth and throat and on the tongue. It causes white patches to form in the mouth and on the tongue. Many cases of thrush are mild, but this infection can also be serious. Ginette Pitman can be a repeated (recurrent) problem for certain people who have a weak body defense system (immune system). The weakness can be caused by chronic illnesses, or by taking medicines that limit the body's ability to fight infection. If a person has difficulty fighting infection, the fungus that causes thrush can spread through the body. This can cause life-threatening blood or organ infections. What are the  causes? This condition is caused by a fungus (yeast) called Candida albicans.  This fungus is normally present in small amounts in the mouth and on other mucous membranes. It usually causes no harm.  If conditions are present that allow the fungus to grow without control, it invades surrounding tissues and becomes an infection.  Other Candida species can also lead to thrush, though this is rare. What increases the risk? The following factors may make you more likely to develop this condition:  Having a weakened immune system.  Being an older adult.  Having diabetes, cancer, or HIV (human immunodeficiency virus).  Having dry mouth (xerostomia).  Being pregnant or breastfeeding.  Having poor dental care, especially in those who have dentures.  Using antibiotic or steroid medicines. What are the signs or symptoms? Symptoms of this condition can vary from mild and moderate to severe and persistent. Symptoms may include:  A burning feeling in the mouth and throat. This can occur at the start of a thrush infection.  White patches that stick to the mouth and tongue. The tissue around the patches may be red, raw, and painful. If rubbed (during tooth brushing, for example), the patches and the tissue of the mouth may bleed easily.  A bad taste in the mouth or difficulty tasting foods.  A cottony feeling in the mouth.  Pain during eating and swallowing.  Poor appetite.  Cracking at the corners of the mouth.   How is this diagnosed? This condition is diagnosed based on:  A physical exam.  Your medical history. How is this treated? This condition is treated with medicines called antifungals, which prevent the growth of fungi. These medicines are either applied directly to the affected area (topical) or swallowed (oral). The treatment will depend on the severity of the condition.  Mild cases of thrush may be treated with an antifungal mouth rinse or lozenges. Treatment usually  lasts about 14 days.  Moderate to severe cases of thrush can be treated with oral antifungal medicine, if they have spread to the esophagus. A topical antifungal medicine may also be used. For some severe infections, treatment may need to continue for more than 14 days. ? Oral antifungal medicines are rarely used during pregnancy because they may be harmful to the unborn child. If you are pregnant, talk with your health care provider about options for treatment.  Persistent or recurrent thrush. For cases of thrush that do not go away or keep coming back: ? Treatment may be needed twice as long as the symptoms last. ? Treatment will include both oral and topical antifungal medicines. ? People with a  weakened immune system can take an antifungal medicine on a continuous basis to prevent thrush infections. It is important to treat conditions that make a person more likely to get thrush, such as diabetes or HIV. Follow these instructions at home: Medicines  Take or use over-the-counter and prescription medicines only as told by your health care provider.  Talk with your health care provider about an over-the-counter medicine called gentian violet, which kills bacteria and fungi. Relieving soreness and discomfort To help reduce the discomfort of thrush:  Drink cold liquids such as water or iced tea.  Try flavored ice treats or frozen juices.  Eat foods that are easy to swallow, such as gelatin, ice cream, or custard.  Try drinking from a straw if the patches in your mouth are painful.   General instructions  Eat plain, unflavored yogurt as directed by your health care provider. Check the label to make sure the yogurt contains live cultures. This yogurt can help healthy bacteria grow in the mouth and can stop the growth of the fungus that causes thrush.  If you wear dentures, remove the dentures before going to bed, brush them vigorously, and soak them in a cleaning solution as directed by  your health care provider.  Rinse your mouth with a warm salt-water mixture several times a day. To make a salt-water mixture, dissolve -1 tsp (3-6 g) of salt in 1 cup (237 mL) of warm water. Contact a health care provider if:  Your symptoms are getting worse or are not improving within 7 days of starting treatment.  You have symptoms of a spreading infection, such as white patches on the skin outside of the mouth.  You are breastfeeding your baby and you have redness and pain in the nipples. Summary  Oral thrush, also called oral candidiasis, is a fungal infection that develops in the mouth and throat and on the tongue. It causes white patches to form in the mouth and on the tongue.  You are more likely to get this condition if you have a weakened immune system or an underlying condition, such as HIV, cancer, or diabetes.  This condition is treated with medicines called antifungals, which prevent the growth of fungi.  Contact a health care provider if your symptoms do not improve, or get worse, within 7 days of starting treatment. This information is not intended to replace advice given to you by your health care provider. Make sure you discuss any questions you have with your health care provider. Document Revised: 07/15/2019 Document Reviewed: 07/15/2019 Elsevier Patient Education  2021 Elsevier Inc.   General Headache Without Cause A headache is pain or discomfort felt around the head or neck area. The specific cause of a headache may not be found. There are many causes and types of headaches. A few common ones are:  Tension headaches.  Migraine headaches.  Cluster headaches.  Chronic daily headaches. Follow these instructions at home: Watch your condition for any changes. Let your health care provider know about them. Take these steps to help with your condition: Managing pain  Take over-the-counter and prescription medicines only as told by your health care  provider.  Lie down in a dark, quiet room when you have a headache.  If directed, put ice on your head and neck area: ? Put ice in a plastic bag. ? Place a towel between your skin and the bag. ? Leave the ice on for 20 minutes, 2-3 times per day.  If directed, apply heat to  the affected area. Use the heat source that your health care provider recommends, such as a moist heat pack or a heating pad. ? Place a towel between your skin and the heat source. ? Leave the heat on for 20-30 minutes. ? Remove the heat if your skin turns bright red. This is especially important if you are unable to feel pain, heat, or cold. You may have a greater risk of getting burned.  Keep lights dim if bright lights bother you or make your headaches worse.      Eating and drinking  Eat meals on a regular schedule.  If you drink alcohol: ? Limit how much you use to:  0-1 drink a day for women.  0-2 drinks a day for men. ? Be aware of how much alcohol is in your drink. In the [place] one drink equals one 12 oz bottle of beer (355 mL), one 5 oz glass of wine (148 mL), or one 1 oz glass of hard liquor (44 mL).  Stop drinking caffeine, or decrease the amount of caffeine you drink. General instructions  Keep a headache journal to help find out what may trigger your headaches. For example, write down: ? What you eat and drink. ? How much sleep you get. ? Any change to your diet or medicines.  Try massage or other relaxation techniques.  Limit stress.  Sit up straight, and do not tense your muscles.  Do not use any products that contain nicotine or tobacco, such as cigarettes, e-cigarettes, and chewing tobacco. If you need help quitting, ask your health care provider.  Exercise regularly as told by your health care provider.  Sleep on a regular schedule. Get 7-9 hours of sleep each night, or the amount recommended by your health care provider.  Keep all follow-up visits as told by your health care  provider. This is important.   Contact a health care provider if:  Your symptoms are not helped by medicine.  You have a headache that is different from the usual headache.  You have nausea or you vomit.  You have a fever. Get help right away if:  Your headache becomes severe quickly.  Your headache gets worse after moderate to intense physical activity.  You have repeated vomiting.  You have a stiff neck.  You have a loss of vision.  You have problems with speech.  You have pain in the eye or ear.  You have muscular weakness or loss of muscle control.  You lose your balance or have trouble walking.  You feel faint or pass out.  You have confusion.  You have a seizure. Summary  A headache is pain or discomfort felt around the head or neck area.  There are many causes and types of headaches. In some cases, the cause may not be found.  Keep a headache journal to help find out what may trigger your headaches. Watch your condition for any changes. Let your health care provider know about them.  Contact a health care provider if you have a headache that is different from the usual headache, or if your symptoms are not helped by medicine.  Get help right away if your headache becomes severe, you vomit, you have a loss of vision, you lose your balance, or you have a seizure. This information is not intended to replace advice given to you by your health care provider. Make sure you discuss any questions you have with your health care provider. Document Revised: 03/29/2018 Document Reviewed:  03/29/2018 Elsevier Patient Education  2021 Elsevier Inc.   Tension Headache, Adult A tension headache is a feeling of pain, pressure, or aching over the front and sides of the head. The pain can be dull, or it can feel tight. There are two types of tension headache:  Episodic tension headache. This is when the headaches happen fewer than 15 days a month.  Chronic tension headache.  This is when the headaches happen more than 15 days a month during a 41-month period. A tension headache can last from 30 minutes to several days. It is the most common kind of headache. Tension headaches are not normally associated with nausea or vomiting, and they do not get worse with physical activity. What are the causes? The exact cause of this condition is not known. Tension headaches are often triggered by stress, anxiety, or depression. Other triggers may include:  Alcohol.  Too much caffeine or caffeine withdrawal.  Respiratory infections, such as colds, flu, or sinus infections.  Dental problems or teeth clenching.  Fatigue.  Holding your head and neck in the same position for a long period of time, such as while using a computer.  Smoking.  Arthritis of the neck. What are the signs or symptoms? Symptoms of this condition include:  A feeling of pressure or tightness around the head.  Dull, aching head pain.  Pain over the front and sides of the head.  Tenderness in the muscles of the head, neck, and shoulders. How is this diagnosed? This condition may be diagnosed based on your symptoms, your medical history, and a physical exam. If your symptoms are severe or unusual, you may have imaging tests, such as a CT scan or an MRI of your head. Your vision may also be checked. How is this treated? This condition may be treated with lifestyle changes and with medicines that help relieve symptoms. Follow these instructions at home: Managing pain  Take over-the-counter and prescription medicines only as told by your health care provider.  When you have a headache, lie down in a dark, quiet room.  If directed, put ice on your head and neck. To do this: ? Put ice in a plastic bag. ? Place a towel between your skin and the bag. ? Leave the ice on for 20 minutes, 2-3 times a day. ? Remove the ice if your skin turns bright red. This is very important. If you cannot feel pain,  heat, or cold, you have a greater risk of damage to the area.  If directed, apply heat to the back of your neck as often as told by your health care provider. Use the heat source that your health care provider recommends, such as a moist heat pack or a heating pad. ? Place a towel between your skin and the heat source. ? Leave the heat on for 20-30 minutes. ? Remove the heat if your skin turns bright red. This is especially important if you are unable to feel pain, heat, or cold. You have a greater risk of getting burned. Eating and drinking  Eat meals on a regular schedule.  If you drink alcohol: ? Limit how much you have to:  0-1 drink a day for women who are not pregnant.  0-2 drinks a day for men. ? Know how much alcohol is in your drink. In the [place] one drink equals one 12 oz bottle of beer (355 mL), one 5 oz glass of wine (148 mL), or one 1 oz glass of hard  liquor (44 mL).  Drink enough fluid to keep your urine pale yellow.  Decrease your caffeine intake, or stop using caffeine. Lifestyle  Get 7-9 hours of sleep each night, or get the amount of sleep recommended by your health care provider.  At bedtime, remove computers, phones, and tablets from your room.  Find ways to manage your stress. This may include: ? Exercise. ? Deep breathing exercises. ? Yoga. ? Listening to music. ? Positive mental imagery.  Try to sit up straight and avoid tensing your muscles.  Do not use any products that contain nicotine or tobacco. These include cigarettes, chewing tobacco, and vaping devices, such as e-cigarettes. If you need help quitting, ask your health care provider. General instructions  Avoid any headache triggers. Keep a journal to help find out what may trigger your headaches. For example, write down: ? What you eat and drink. ? How much sleep you get. ? Any change to your diet or medicines.  Keep all follow-up visits. This is important.   Contact a health care provider  if:  Your headache does not get better.  Your headache comes back.  You are sensitive to sounds, light, or smells because of a headache.  You have nausea or you vomit.  Your stomach hurts. Get help right away if:  You suddenly develop a severe headache, along with any of the following: ? A stiff neck. ? Nausea and vomiting. ? Confusion. ? Weakness in one part or one side of your body. ? Double vision or loss of vision. ? Shortness of breath. ? Rash. ? Unusual sleepiness. ? Fever or chills. ? Trouble speaking. ? Pain in your eye or ear. ? Trouble walking or balancing. ? Feeling faint or passing out. Summary  A tension headache is a feeling of pain, pressure, or aching over the front and sides of the head.  A tension headache can last from 30 minutes to several days. It is the most common kind of headache.  This condition may be diagnosed based on your symptoms, your medical history, and a physical exam.  This condition may be treated with lifestyle changes and with medicines that help relieve symptoms. This information is not intended to replace advice given to you by your health care provider. Make sure you discuss any questions you have with your health care provider. Document Revised: 06/07/2020 Document Reviewed: 06/07/2020 Elsevier Patient Education  2021 [employer].

## 2020-10-31 LAB — URINALYSIS, ROUTINE W REFLEX MICROSCOPIC
Bilirubin, UA: NEGATIVE
Glucose, UA: NEGATIVE
Leukocytes,UA: NEGATIVE
Nitrite, UA: NEGATIVE
RBC, UA: NEGATIVE
Specific Gravity, UA: >=1.03 — AB (ref 1.005–1.030)
Urobilinogen, Ur: 1 mg/dL (ref 0.2–1.0)
pH, UA: 8 — ABNORMAL HIGH (ref 5.0–7.5)

## 2020-10-31 LAB — HIV ANTIBODY (ROUTINE TESTING W REFLEX): HIV Screen 4th Generation wRfx: NONREACTIVE

## 2020-11-05 NOTE — Telephone Encounter (Signed)
Mailed

## 2020-11-07 ENCOUNTER — Telehealth: Payer: Self-pay

## 2020-11-07 NOTE — Telephone Encounter (Signed)
Patient was contacted but had to leave her another voicemail letting her know that she is needing to take her medication as prescribed by Dr. Tobi Bastos.

## 2020-11-07 NOTE — Telephone Encounter (Signed)
-----   Message from Wilnette Kales, [place] sent at 10/25/2020 10:53 AM EST ----- Can you contact patient about results? It looks like he did not send in prescription either.  Thank You!   ----- Message ----- From: Wyline Mood, MD Sent: 10/25/2020   8:07 AM EST To: Wilnette Kales, CMA  Brushings shows candida of the esophagus .   Start on Diflucan day 1 : 400 mg once on day 1 followed by diflucan 200 mg once daily next 13 days. Total of 14 days treatment . It should make her swallowing much better

## 2020-11-09 NOTE — Progress Notes (Signed)
She had PFTs on 03 September 2020.  They are actually quite benign.  I am not sure that all of her symptoms are pulmonary.  I think she has issues that may be cardiac and/or allergic.  To be triggered by "odors" particularly those of cigarette smoke.  She has not provided a history that was consistent with obstructive sleep apnea but if you want you can order a sleep study.  I am concerned that some of her issues are anxiety and stress induced as well.  [name]

## 2020-11-09 NOTE — Progress Notes (Signed)
I am concerned that a lot of her issues are not related to pulmonary problems.  Her pulmonary function tests performed in December where really benign.  He has been instructed on proper rinsing etc. however I am not sure if she is doing that after use of her Symbicort.  Odors" and this is going to be difficult to control with as needed dose of Symbicort.  I would just advocate for just as needed albuterol and call it a day.  Has she had HIV testing?

## 2020-11-13 DIAGNOSIS — M5441 Lumbago with sciatica, right side: Secondary | ICD-10-CM | POA: Diagnosis not present

## 2020-11-13 DIAGNOSIS — M25551 Pain in right hip: Secondary | ICD-10-CM | POA: Diagnosis not present

## 2020-11-13 DIAGNOSIS — G8929 Other chronic pain: Secondary | ICD-10-CM | POA: Diagnosis not present

## 2020-11-13 DIAGNOSIS — M533 Sacrococcygeal disorders, not elsewhere classified: Secondary | ICD-10-CM | POA: Diagnosis not present

## 2020-11-16 ENCOUNTER — Telehealth: Payer: Self-pay | Admitting: Internal Medicine

## 2020-11-16 ENCOUNTER — Other Ambulatory Visit: Payer: Self-pay | Admitting: Internal Medicine

## 2020-11-16 DIAGNOSIS — N3281 Overactive bladder: Secondary | ICD-10-CM

## 2020-11-16 MED ORDER — TOVIAZ 8 MG PO TB24: 8.0000 mg | ORAL_TABLET | Freq: Every day | ORAL | Status: DC

## 2020-11-16 NOTE — Telephone Encounter (Signed)
Received Patient assistance medication for Toviaz 8 mg.   This medication was discontinued of the Patient's medication list. Should Patient be on this medication?   If so needing this placed back on her list so that I may properly label medication and call Patient to pick this up.   Please advise

## 2020-11-16 NOTE — Telephone Encounter (Signed)
Please stop trospium 20 mg (I dc'ed from list) please call pharmacy and d/c and make sure pt knows not to take with toviaz 8 mg please  She can pick up samples from pt Rx program of toviaz 8 mg    Thank you

## 2020-11-19 NOTE — Telephone Encounter (Signed)
Patient informed and verbalized understanding.   States she has been getting tingling and numbness in the tips of the toes on the left foot. States walking is uncomfortable.  Please advise

## 2020-11-20 DIAGNOSIS — G8929 Other chronic pain: Secondary | ICD-10-CM | POA: Diagnosis not present

## 2020-11-20 DIAGNOSIS — M533 Sacrococcygeal disorders, not elsewhere classified: Secondary | ICD-10-CM | POA: Diagnosis not present

## 2020-11-23 DIAGNOSIS — E538 Deficiency of other specified B group vitamins: Secondary | ICD-10-CM | POA: Diagnosis not present

## 2020-11-23 DIAGNOSIS — R2990 Unspecified symptoms and signs involving the nervous system: Secondary | ICD-10-CM | POA: Diagnosis not present

## 2020-11-23 DIAGNOSIS — E519 Thiamine deficiency, unspecified: Secondary | ICD-10-CM | POA: Diagnosis not present

## 2020-11-23 DIAGNOSIS — R4189 Other symptoms and signs involving cognitive functions and awareness: Secondary | ICD-10-CM | POA: Diagnosis not present

## 2020-11-23 NOTE — Telephone Encounter (Signed)
Inform pt  She needs to discuss this with Dr. Shari Prows neurology  Please Fax this phone call to him and she needs to call and schedule another appt with him to discuss  She likely needs a nerve conduction test/shock test of lower legs he has already ordered an MRI of her brain as well    Diagnoses   Cognitive impairment     Procedures   MRI brain without contrast   Jolene Provost, MD   [phone] [facility] [address]   [facility]   [place], [place] [address]   Phone: [phone]   Fax: [phone]

## 2020-11-26 ENCOUNTER — Other Ambulatory Visit (HOSPITAL_COMMUNITY): Payer: Self-pay | Admitting: Neurology

## 2020-11-26 ENCOUNTER — Other Ambulatory Visit: Payer: Self-pay | Admitting: Neurology

## 2020-11-26 DIAGNOSIS — R4189 Other symptoms and signs involving cognitive functions and awareness: Secondary | ICD-10-CM

## 2020-11-27 NOTE — Telephone Encounter (Signed)
Spoke with pt to let her know that per Dr. Judie Grieve she should schedule an appt with Dr. Sherryll Burger to speak with him about this. I have printed the telephone encounter and faxed to Dr. Margaretmary Eddy office. Pt stated that she would either mychart or call Dr. Margaretmary Eddy office.

## 2020-12-03 ENCOUNTER — Ambulatory Visit: Payer: Medicare Other | Admitting: Gastroenterology

## 2020-12-03 ENCOUNTER — Other Ambulatory Visit: Payer: Self-pay

## 2020-12-03 VITALS — BP 129/82 | HR 67 | Ht 65.0 in | Wt 167.0 lb

## 2020-12-03 DIAGNOSIS — R1310 Dysphagia, unspecified: Secondary | ICD-10-CM

## 2020-12-03 DIAGNOSIS — B3781 Candidal esophagitis: Secondary | ICD-10-CM | POA: Diagnosis not present

## 2020-12-03 MED ORDER — FLUCONAZOLE 200 MG PO TABS: 200.0000 mg | ORAL_TABLET | Freq: Every day | ORAL | 0 refills | Status: AC

## 2020-12-03 NOTE — Progress Notes (Signed)
Wyline Mood MD, MRCP(U.K) [address]  [address]  [place], [place] [address]  Main: [phone]  Fax: [phone]   Primary Care Physician: McLean-Scocuzza, Pasty Spillers, MD  Primary Gastroenterologist:  Dr. Wyline Mood   Dysphagia follow up   HPI: [name] is a 65 y.o. female    Summary of history :  Initially referred and seen on 10/15/2020 for dysphagia for a few months at every meal, solids> liquids , no weight loss , she had been on Prilosec 40 mg . She presented to the ER on 08/24/2020 with difficulty swallowing , referred to see me as an OP.  She states that she was seen by ENT had a laryngoscopy and was told she had no abnormalities and needed to pursue GI evaluation.  Interval history   10/15/2020- 12/03/2020   09/23/2020: EGD: Features of esophageal candida treated with Diflucan .  KOH stain showed budding yeast  When she was initially given the Diflucan she said that the swallowing significantly improved but after completing her course a few weeks back with that the dysphagia has gradually returned to what it was previously.  She has stopped taking her Advair inhaler.  She is taking her Prilosec once a day. Current Outpatient Medications  Medication Sig Dispense Refill  . albuterol (VENTOLIN HFA) 108 (90 Base) MCG/ACT inhaler Inhale 2 puffs into the lungs every 6 (six) hours as needed for wheezing or shortness of breath. 18 g 12  . amLODipine (NORVASC) 2.5 MG tablet Take 1 tablet (2.5 mg total) by mouth daily. 90 tablet 3  . APPLE CIDER VINEGAR PO Take 1 tablet by mouth daily.    . Ascorbic Acid (VITAMIN C PO) Take 1,000 mg by mouth daily.    Marland Kitchen aspirin 81 MG chewable tablet Chew by mouth daily.    . Calcium Carbonate-Vit D-Min (CALCIUM 1200 PO) Take 1 capsule by mouth daily at 12 noon.    . Cyanocobalamin (B-12 PO) Take 1 tablet by mouth daily.    . cyclobenzaprine (FLEXERIL) 5 MG tablet Take 1 tablet (5 mg total) by mouth at bedtime as needed for muscle spasms. 90  tablet 1  . famotidine (PEPCID) 20 MG tablet Take 1 tablet (20 mg total) by mouth at bedtime. 90 tablet 3  . fesoterodine (TOVIAZ) 8 MG TB24 tablet Take 1 tablet (8 mg total) by mouth daily. Mail order samples drug co approved 90 tablet   . fluconazole (DIFLUCAN) 200 MG tablet Take 1 tablet (200 mg total) by mouth daily. 90 tablet 3  . fluticasone-salmeterol (ADVAIR HFA) 115-21 MCG/ACT inhaler Inhale 2 puffs into the lungs 2 (two) times daily. Rinse mouth 12 g 11  . levothyroxine (SYNTHROID) 175 MCG tablet Take 1 tablet (175 mcg total) by mouth daily before breakfast. 30 min before food 90 tablet 3  . LORazepam (ATIVAN) 1 MG tablet Take 0.5 tablets (0.5 mg total) by mouth daily as needed for anxiety. 30 tablet 0  . MELATONIN PO Take 1 tablet by mouth daily. (Patient not taking: Reported on 10/30/2020)    . Moringa Oleifera (MORINGA PO) Take 6,000 mg by mouth daily.    . Multiple Vitamins-Calcium (ONE-A-DAY WOMENS FORMULA PO) Take 1 capsule by mouth daily.    . Multiple Vitamins-Minerals (ZINC PO) Take by mouth. (Patient not taking: Reported on 10/30/2020)    . Omega-3 Fatty Acids (OMEGA 3 PO) Take 1 capsule by mouth daily.    Marland Kitchen omeprazole (PRILOSEC) 40 MG capsule Take 40 mg by mouth  daily.    . Probiotic Product (PROBIOTIC DAILY PO) Take by mouth.    . traZODone (DESYREL) 50 MG tablet Take 1 tablet (50 mg total) by mouth at bedtime as needed for sleep. 30 tablet 0  . TURMERIC-GINGER PO Take 1 each by mouth daily.    Marland Kitchen UBIQUINOL LIPOSOMAL PO Take by mouth daily at 12 noon.    Marland Kitchen VITAMIN D, CHOLECALCIFEROL, PO Take 5,000 mg by mouth daily at 12 noon.     No current facility-administered medications for this visit.    Allergies as of 12/03/2020  . (No Known Allergies)    ROS:  General: Negative for anorexia, weight loss, fever, chills, fatigue, weakness. ENT: Negative for hoarseness, difficulty swallowing , nasal congestion. CV: Negative for chest pain, angina, palpitations, dyspnea on  exertion, peripheral edema.  Respiratory: Negative for dyspnea at rest, dyspnea on exertion, cough, sputum, wheezing.  GI: See history of present illness. GU:  Negative for dysuria, hematuria, urinary incontinence, urinary frequency, nocturnal urination.  Endo: Negative for unusual weight change.    Physical Examination:   BP 129/82   Pulse 67   Ht 5\' 5"  (1.651 m)   Wt 167 lb (75.8 kg)   BMI 27.79 kg/m   General: Well-nourished, well-developed in no acute distress.  Eyes: No icterus. Conjunctivae pink. No evidence of Candida on her tongue or back or throat Neuro: Alert and oriented x 3.  Grossly intact. Skin: Warm and dry, no jaundice.   Psych: Alert and cooperative, normal mood and affect.   Imaging Studies: No results found.  Assessment and Plan:   [name] is a 65 y.o. y/o female here today to see me as a follow-up for dysphagia.  EGD performed recently showed esophageal candidiasis.  Treated with Diflucan for 2 weeks.  Likely secondary to fluticasone inhaler.  Her symptoms of dysphagia returned after completing a course of Diflucan which significantly helped initially.  Possible that the Candida has recurred.  I will treat her with 2 more weeks of Diflucan and if the dysphagia persists then may need a repeat endoscopy to ensure that endoscopically she has no Candida and if none seen May need evaluation with esophageal manometry and pH testing    Dr June  MD,MRCP [facility]) Follow up in 3 weeks telephone visit

## 2020-12-05 ENCOUNTER — Encounter (HOSPITAL_COMMUNITY): Payer: Self-pay

## 2020-12-05 ENCOUNTER — Ambulatory Visit (HOSPITAL_COMMUNITY): Admission: RE | Admit: 2020-12-05 | Payer: Medicare Other | Source: Ambulatory Visit

## 2020-12-06 ENCOUNTER — Ambulatory Visit (INDEPENDENT_AMBULATORY_CARE_PROVIDER_SITE_OTHER): Payer: Medicare Other | Admitting: Pharmacist

## 2020-12-06 DIAGNOSIS — E039 Hypothyroidism, unspecified: Secondary | ICD-10-CM | POA: Diagnosis not present

## 2020-12-06 DIAGNOSIS — J4530 Mild persistent asthma, uncomplicated: Secondary | ICD-10-CM

## 2020-12-06 DIAGNOSIS — I10 Essential (primary) hypertension: Secondary | ICD-10-CM

## 2020-12-06 DIAGNOSIS — N3281 Overactive bladder: Secondary | ICD-10-CM

## 2020-12-06 DIAGNOSIS — F339 Major depressive disorder, recurrent, unspecified: Secondary | ICD-10-CM | POA: Diagnosis not present

## 2020-12-06 DIAGNOSIS — J9801 Acute bronchospasm: Secondary | ICD-10-CM

## 2020-12-06 MED ORDER — ALBUTEROL SULFATE HFA 108 (90 BASE) MCG/ACT IN AERS: 1.0000 | INHALATION_SPRAY | Freq: Four times a day (QID) | RESPIRATORY_TRACT | 11 refills | Status: DC | PRN

## 2020-12-06 NOTE — Chronic Care Management (AMB) (Signed)
Chronic Care Management Pharmacy Note  12/06/2020 Name:  [name] MRN:  [id number] DOB:  09-21-1956  Subjective: [name] is an 65 y.o. year old female who is a primary patient of McLean-Scocuzza, Nino Glow, MD.  The CCM team was consulted for assistance with disease management and care coordination needs.    Engaged with patient by telephone for follow up visit in response to provider referral for pharmacy case management and/or care coordination services.   Consent to Services:  The patient was given information about Chronic Care Management services, agreed to services, and gave verbal consent prior to initiation of services.  Please see initial visit note for detailed documentation.   Patient Care Team: McLean-Scocuzza, Nino Glow, MD as PCP - General (Internal Medicine) De Hollingshead, RPH-CPP (Pharmacist)  Recent office visits:  2/8 - PCP f/u chest tightness, headache - consider sleep study, keep neuro f/u, psych referral  Recent consult visits:  2/22- phys med Dr. Alba Destine for back/hip pain, plan for SI joint injection (done 3/1)  3/4 - neuro Dr. Manuella Ghazi, discussed brain MRI, neurocognitive testing, avoiding sedating medications  3/14- GI Vicente Males - dysphagia, repeat course of fluconazole  Hospital visits: None in previous 6 months  Objective:  Lab Results  Component Value Date   CREATININE 0.71 08/24/2020   CREATININE 0.70 10/04/2019   CREATININE 0.60 07/15/2019    Lab Results  Component Value Date   HGBA1C 5.6 11/30/2019   Last diabetic Eye exam: No results found for: HMDIABEYEEXA  Last diabetic Foot exam: No results found for: HMDIABFOOTEX      Component Value Date/Time   CHOL 189 10/30/2020 1053   TRIG 173.0 (H) 10/30/2020 1053   HDL 63.70 10/30/2020 1053   CHOLHDL 3 10/30/2020 1053   VLDL 34.6 10/30/2020 1053   LDLCALC 90 10/30/2020 1053    Hepatic Function Latest Ref Rng & Units 10/30/2020 11/30/2019 07/15/2019  Total Protein 6.0 - 8.3 g/dL  7.0 7.2 7.7  Albumin 3.5 - 5.2 g/dL 4.3 4.2 4.2  AST 0 - 37 U/L 26 24 93(H)  ALT 0 - 35 U/L 27 23 121(H)  Alk Phosphatase 39 - 117 U/L 73 60 60  Total Bilirubin 0.2 - 1.2 mg/dL 0.5 0.6 0.5  Bilirubin, Direct 0.0 - 0.3 mg/dL 0.1 0.1 -    Lab Results  Component Value Date/Time   TSH 2.69 10/30/2020 10:53 AM   TSH 2.04 02/06/2020 01:34 PM    CBC Latest Ref Rng & Units 08/24/2020 10/04/2019 07/15/2019  WBC 4.0 - 10.5 K/uL 7.6 7.0 3.8(L)  Hemoglobin 12.0 - 15.0 g/dL 12.7 13.1 12.5  Hematocrit 36.0 - 46.0 % 38.0 39.4 36.3  Platelets 150 - 400 K/uL 228 266 196    Lab Results  Component Value Date/Time   VD25OH 52.95 11/30/2019 11:02 AM    Clinical ASCVD: No  The 10-year ASCVD risk score Mikey Bussing DC Jr., et al., 2013) is: 4.9%   Values used to calculate the score:     Age: 65 years     Sex: Female     Is Non-Hispanic African American: No     Diabetic: No     Tobacco smoker: No     Systolic Blood Pressure: 409 mmHg     Is BP treated: Yes     HDL Cholesterol: 63.7 mg/dL     Total Cholesterol: 189 mg/dL    Other: (CHADS2VASc if Afib, PHQ9 if depression, MMRC or CAT for COPD, ACT, DEXA)  Social History  Tobacco Use  Smoking Status Never Smoker  Smokeless Tobacco Never Used   BP Readings from Last 3 Encounters:  12/03/20 129/82  10/30/20 104/78  10/24/20 (!) 154/91   Pulse Readings from Last 3 Encounters:  12/03/20 67  10/30/20 73  10/24/20 67   Wt Readings from Last 3 Encounters:  12/03/20 167 lb (75.8 kg)  10/30/20 171 lb 9.6 oz (77.8 kg)  10/24/20 171 lb (77.6 kg)    Assessment: Review of patient past medical history, allergies, medications, health status, including review of consultants reports, laboratory and other test data, was performed as part of comprehensive evaluation and provision of chronic care management services.   SDOH:  (Social Determinants of Health) assessments and interventions performed:  SDOH Interventions   Flowsheet Row Most Recent Value   SDOH Interventions   Financial Strain Interventions Other (Comment)  [care guide referral]  Stress Interventions Other (Comment)  [provided empathetic listening]  Transportation Interventions Other (Comment)  [care guide referral]      CCM Care Plan  No Known Allergies  Medications Reviewed Today    Reviewed by De Hollingshead, RPH-CPP (Pharmacist) on 12/06/20 at 1047  Med List Status: <None>  Medication Order Taking? Sig Documenting Provider Last Dose Status Informant  albuterol (VENTOLIN HFA) 108 (90 Base) MCG/ACT inhaler [id number]  Inhale 2 puffs into the lungs every 6 (six) hours as needed for wheezing or shortness of breath. McLean-Scocuzza, Nino Glow, MD  Active   amLODipine (NORVASC) 2.5 MG tablet [id number] Yes Take 1 tablet (2.5 mg total) by mouth daily. McLean-Scocuzza, Nino Glow, MD Taking Active   APPLE CIDER VINEGAR PO [id number] Yes Take 1 tablet by mouth daily. [provider] Taking Active   Ascorbic Acid (VITAMIN C PO) [id number] Yes Take 1,000 mg by mouth daily. [provider] Taking Active   aspirin 81 MG chewable tablet [id number] No Chew by mouth daily.  Patient not taking: Reported on 12/06/2020   [provider] Not Taking Active   CALCIUM CITRATE PO [id number] Yes Take 600 mg by mouth daily. [provider] Taking Active   Cyanocobalamin (B-12 PO) [id number] Yes Take 1 tablet by mouth daily. [provider] Taking Active   cyclobenzaprine (FLEXERIL) 5 MG tablet [id number] No Take 1 tablet (5 mg total) by mouth at bedtime as needed for muscle spasms.  Patient not taking: Reported on 12/06/2020   McLean-Scocuzza, Nino Glow, MD Not Taking Active   famotidine (PEPCID) 20 MG tablet [id number] Yes Take 1 tablet (20 mg total) by mouth at bedtime. McLean-Scocuzza, Nino Glow, MD Taking Active   fesoterodine (TOVIAZ) 8 MG TB24 tablet [id number] Yes Take 1 tablet (8 mg total) by mouth daily. Mail order samples drug co approved McLean-Scocuzza,  Nino Glow, MD Taking Active   fluconazole (DIFLUCAN) 200 MG tablet [id number] Yes Take 1 tablet (200 mg total) by mouth daily for 14 days. Jonathon Bellows, MD Taking Active         Discontinued 12/06/20 1047 (Change in therapy)   levothyroxine (SYNTHROID) 175 MCG tablet [id number] Yes Take 1 tablet (175 mcg total) by mouth daily before breakfast. 30 min before food McLean-Scocuzza, Nino Glow, MD Taking Active   LORazepam (ATIVAN) 1 MG tablet [id number] Yes Take 0.5 tablets (0.5 mg total) by mouth daily as needed for anxiety. McLean-Scocuzza, Nino Glow, MD Taking Active   MELATONIN PO [id number] Yes Take 1 tablet by mouth daily. [provider] Taking Active            Med  Note Darnelle Maffucci, Alexandr Yaworski E   Thu Dec 06, 2020 10:43 AM) Alternating with trazodone  Multiple Vitamins-Calcium (ONE-A-DAY WOMENS FORMULA PO) [id number] Yes Take 1 capsule by mouth daily. [provider] Taking Active   Omega-3 Fatty Acids (OMEGA 3 PO) [id number] Yes Take 1 capsule by mouth daily. [provider] Taking Active   omeprazole (PRILOSEC) 40 MG capsule [id number] Yes Take 40 mg by mouth daily. [provider] Taking Active            Med Note Mayo Ao Oct 29, 2020  3:22 PM) Taking BID  Probiotic Product (PROBIOTIC DAILY PO) [id number] Yes Take by mouth. [provider] Taking Active   traZODone (DESYREL) 50 MG tablet [id number] Yes Take 1 tablet (50 mg total) by mouth at bedtime as needed for sleep. McLean-Scocuzza, Nino Glow, MD Taking Active            Med Note Nat Christen Dec 06, 2020 10:42 AM) Alternating with melatonin  TURMERIC-GINGER PO [id number] Yes Take 1 each by mouth daily. [provider] Taking Active   VITAMIN D, CHOLECALCIFEROL, PO [id number] Yes Take 5,000 mg by mouth daily at 12 noon. [provider] Taking Active           Patient Active Problem List   Diagnosis Date Noted   Insomnia 10/30/2020   Mild persistent asthma  10/30/2020   Esophagitis 10/30/2020   Brain fog 09/28/2020   Trochanteric bursitis of right hip 08/22/2020   Anxiety 06/15/2020   Overactive bladder 06/15/2020   Bronchospasm 06/15/2020   Allergic rhinitis 06/15/2020   Dysphagia 01/25/2020   H/O bilateral breast implants 10/28/2019   Chronic midline low back pain with right-sided sciatica 10/28/2019   Essential hypertension 10/28/2019   Breast pain, left 10/28/2019   Chronic right hip pain 10/28/2019   Osteoporosis 10/28/2019   Shortness of breath 10/28/2019   S/P lumbar fusion 10/28/2019   History of colon polyps 10/28/2019   Hypothyroidism    Depression, recurrent ([place])     Immunization History  Administered Date(s) Administered   Hepb-cpg 12/06/2019, 02/06/2020   Influenza,inj,Quad PF,6+ Mos 10/13/2018, 09/23/2019, 07/03/2020   PFIZER(Purple Top)SARS-COV-2 Vaccination 12/24/2019, 01/15/2020, 09/05/2020   Pneumococcal Polysaccharide-23 10/13/2018    Conditions to be addressed/monitored: Anxiety, Depression and Pulmonary Disease  Care Plan : Medication Management  Updates made by De Hollingshead, RPH-CPP since 12/06/2020 12:00 AM    Problem: Reactive Airway, HTN, Dysphagia, Anxiety     Long-Range Goal: Disease Progression Prevention   Start Date: 10/29/2020  This Visit's Progress: On track  Recent Progress: On track  Priority: High  Note:   Current Barriers:   Unable to independently afford treatment regimen  Unable to achieve control of dysphagia   Low level of health literacy in regard to medication indications  Pharmacist Clinical Goal(s):   Over the next 90 days, patient will verbalize ability to afford treatment regimen  achieve adherence to monitoring guidelines and medication adherence to achieve therapeutic efficacy through collaboration with PharmD and provider.   Interventions:  1:1 collaboration with McLean-Scocuzza, Nino Glow, MD regarding development and update of  comprehensive plan of care as evidenced by provider attestation and co-signature  Inter-disciplinary care team collaboration (see longitudinal plan of care)  Comprehensive medication review performed; medication list updated in electronic medical record  Health Maintenance/SDOH:  Patient reports financial and stress concerns. Notes that her multiple speciality appointments and work up is becoming difficult, along with [employer] bills.  Reports this is causing stress. Requests to see a therapist. Therapy referral was placed by PCP, per referral documentation they attempted to outreach patient 3 times with no response. Patient will also check her voicemails for this. Will send mychart message with phone number for her to call. Care Guide referral placed for financial community resource support.   Not using pill box. Reports being overwhelmed and missing doses. Discussed importance of adherence. Will provide list of how to fill pill box. Patient notes she will use.   Hypertension:  Uncontrolled per last clinic reading, goal <130/80; current treatment: amlodipine 2.5 mg daily   Reviewed that amlodipine was started when HCTZ was stopped (d/t polyuria). Patient notes that she has both bottles and has been alternating taking amlodipine and HCTZ  Advised to throw away HCTZ script. Continue amlodipine 2.5 mg daily   Asthma/Reactive Airway disease:  Uncontrolled; current treatment: told to take albuterol HFA PRN per advice from pulmonary in response to PFT results. Notes that due to continued esophageal irritation/thrush, she stopped all inhalers. Endorses increased hoarseness. Today, she also reports that she has continued to use fluticasone nasal spray.   Last PFT: indicates reactive airway disease.   Approved for [place] assistance, if needed in the future.   Advised to DISCONTINUE fluticasone nasal spray while treating esophageal candida.   Advised that albuterol is not a steroid and she can  use. Sent script to [facility]. Patient reports copay was $35 and too expensive. Reviewed GoodRx, cost is $19 at [facility] w/ GoodRx. Assisted patient in retrieving GoodRx card for this, script sent to [facility]. Patient notes she will get over there to pick up when she is able.   Dysphagia, ?GERD:  Uncontrolled; currently taking omeprazole 40 mg BID- but down to once daily now to conserve supply, famotidine 20 mg QPM. S/p endoscopy w/ Dr. Vicente Males. Second round of candida treatment with fluconzole  Will outreach Dr. Vicente Males per patient request to order omeprazole BID, instead of current order for daily, if that is his intended regimen  Recommend to continue current regimen at this time. Continue follow up with GI.  Hypothyroidism:  Controlled per last TSH; current regimen: levothyroxine 175 mcg daily.   Reviewed to separate this medication from other medications, particularly her vitamins/supplements.   Continue current regimen at this time  Bursitis, Chronic Hip/Back pain:  Uncontrolled; current treatment: cyclobenzaprine 5 mg PRN s/p recent back injection; follows w/ PM&R at [facility].   Encouraged continued collaboration and discussion w/ PM&R regarding goals of treatment.   OAB:  Uncontrolled but improved; Toviaz 8 mg; notes improvement in OAB symptoms but some headache at night. Taking at night, but she plans to move to the morning.  Approved for [name] through [employer] assistance through 09/21/21. She will call us when she opens her last bottle for [place] to call [place] for refill request.   Continue current regimen, moving to the morning.  Anxiety/Insomnia:  Inappropriately managed; current regimen: melatonin 1 mg QPM, trazodone 50 mg QPM - notes that since she only received 30 tab of trazodone, she wanted to stretch the supply and alternates melatonin and trazodone every evening. Reports benefit with trazodone the nights she takes it, requests refills. Also has lorazepam 0.5  mg PRN anxiety  Advised that daily use of melatonin is usually most effective. Advised to take 1/2 -1 tab trazodone QPM if she perceives benefit. Will collaborate w/ PCP for refill on trazodone  Supplement:  Reports use of Calcium citrate 600  mg, Vitamin D 5000 units daily, Vitamin C, Vitamin B12, Vitamin E, melatonin, multivitamin, omega-3-fatty acids, probiotic, turmeric, and "moringa"   Reviewed that many supplements that she is taking and spending money on do not have demonstrated therapeutic benefit. Advised discontinuation of anything not specifically recommended by a provider. She will consider.    Patient Goals/Self-Care Activities  Over the next 90 days, patient will:  - take medications as prescribed focus on medication adherence by using pill box collaborate with provider on medication access solutions  Follow Up Plan: Telephone follow up appointment with care management team member scheduled for: ~ 4 weeks      Medication Assistance: Toviazobtained through [employer] medication assistance program.  Enrollment ends 09/21/21  Patient's preferred pharmacy is:  [facility] [address], [place] - [place] [address] Phone: [phone] Fax: [place], [place] [address] Phone: [phone] Fax: [phone]  Uses pill box? No - notes she has one but isn't using Pt endorses 70% compliance  Follow Up:  Patient agrees to Care Plan and Follow-up.  Plan: Telephone follow up appointment with care management team member scheduled for:  ~ 4 weeks  [name], PharmD, [place], [place] Clinical Pharmacist [employer] at [employer] [phone]

## 2020-12-06 NOTE — Patient Instructions (Addendum)
Visit Information  PATIENT GOALS:  Goals Addressed               This Visit's Progress     Patient Stated     Medication Monitoring (pt-stated)        Patient Goals/Self-Care Activities Over the next 90 days, patient will:  - take medications as prescribed collaborate with provider on medication access solutions         Patient verbalizes understanding of instructions provided today and agrees to view in MyChart.   Plan: Telephone follow up appointment with care management team member scheduled for:  ~4 weeks  Catie Taraya Steward, PharmD, BCACP, CPP Clinical Pharmacist [facility] at [place] [phone] 

## 2020-12-07 DIAGNOSIS — R2990 Unspecified symptoms and signs involving the nervous system: Secondary | ICD-10-CM | POA: Insufficient documentation

## 2020-12-07 DIAGNOSIS — B948 Sequelae of other specified infectious and parasitic diseases: Secondary | ICD-10-CM | POA: Insufficient documentation

## 2020-12-10 ENCOUNTER — Other Ambulatory Visit: Payer: Self-pay | Admitting: Internal Medicine

## 2020-12-10 DIAGNOSIS — G4700 Insomnia, unspecified: Secondary | ICD-10-CM

## 2020-12-10 MED ORDER — TRAZODONE HCL 50 MG PO TABS: 25.0000 mg | ORAL_TABLET | Freq: Every evening | ORAL | 3 refills | Status: DC | PRN

## 2020-12-12 ENCOUNTER — Telehealth: Payer: Self-pay | Admitting: Internal Medicine

## 2020-12-12 NOTE — Telephone Encounter (Signed)
   Telephone encounter was:  Unsuccessful.  12/12/2020 Name: [name] MRN: [id number] DOB: Apr 16, 1956  Unsuccessful outbound call made today to assist with:  Transportation Needs  and Financial Difficulties related to medical bills.   Outreach Attempt:  1st Attempt  A HIPAA compliant voice message was left requesting a return call.  Instructed patient to call back at [phone].  [facility] Care Guide, Embedded Care Coordination [facility], Care Management Phone: [phone] Email: [email or web address]

## 2020-12-13 ENCOUNTER — Telehealth: Payer: Self-pay | Admitting: Gastroenterology

## 2020-12-13 NOTE — Telephone Encounter (Signed)
Patient LVM that her symptoms are getting worse and medication is not helping. What can she do?

## 2020-12-14 ENCOUNTER — Ambulatory Visit (HOSPITAL_COMMUNITY): Admission: RE | Admit: 2020-12-14 | Payer: Medicare Other | Source: Ambulatory Visit

## 2020-12-14 NOTE — Telephone Encounter (Signed)
Repeat EGD if still has dysphagia despite repeat treatment with Diflucan

## 2020-12-17 ENCOUNTER — Other Ambulatory Visit: Payer: Self-pay

## 2020-12-17 MED ORDER — OMEPRAZOLE 40 MG PO CPDR: 40.0000 mg | DELAYED_RELEASE_CAPSULE | Freq: Two times a day (BID) | ORAL | 2 refills | Status: DC

## 2020-12-19 ENCOUNTER — Other Ambulatory Visit
Admission: RE | Admit: 2020-12-19 | Discharge: 2020-12-19 | Disposition: A | Discharge status: Home / Self Care | Payer: Medicare Other | Source: Ambulatory Visit | Attending: Gastroenterology | Admitting: Gastroenterology

## 2020-12-19 ENCOUNTER — Other Ambulatory Visit: Payer: Self-pay

## 2020-12-19 ENCOUNTER — Ambulatory Visit (HOSPITAL_COMMUNITY)
Admission: RE | Admit: 2020-12-19 | Discharge: 2020-12-19 | Disposition: A | Discharge status: Home / Self Care | Payer: Medicare Other | Source: Ambulatory Visit | Attending: Neurology | Admitting: Neurology

## 2020-12-19 DIAGNOSIS — Z20822 Contact with and (suspected) exposure to covid-19: Secondary | ICD-10-CM | POA: Diagnosis not present

## 2020-12-19 DIAGNOSIS — R4189 Other symptoms and signs involving cognitive functions and awareness: Secondary | ICD-10-CM | POA: Diagnosis not present

## 2020-12-19 DIAGNOSIS — G3184 Mild cognitive impairment, so stated: Secondary | ICD-10-CM | POA: Diagnosis not present

## 2020-12-19 DIAGNOSIS — B3781 Candidal esophagitis: Secondary | ICD-10-CM | POA: Insufficient documentation

## 2020-12-19 DIAGNOSIS — R930 Abnormal findings on diagnostic imaging of skull and head, not elsewhere classified: Secondary | ICD-10-CM | POA: Diagnosis not present

## 2020-12-19 DIAGNOSIS — Z01812 Encounter for preprocedural laboratory examination: Secondary | ICD-10-CM | POA: Insufficient documentation

## 2020-12-19 LAB — SARS CORONAVIRUS 2 (TAT 6-24 HRS): SARS Coronavirus 2: NEGATIVE

## 2020-12-19 NOTE — Progress Notes (Signed)
Patient has been contact in regards to procedure being scheduled for tomorrow. Verbally gave instructions to patient. Also, sent a copy via my chart. Pt verbalized understanding.

## 2020-12-20 ENCOUNTER — Ambulatory Visit: Payer: Medicare Other | Admitting: Anesthesiology

## 2020-12-20 ENCOUNTER — Encounter
Admission: RE | Disposition: A | Discharge status: Home / Self Care | Payer: Self-pay | Source: Home / Self Care | Attending: Gastroenterology

## 2020-12-20 ENCOUNTER — Ambulatory Visit
Admission: RE | Admit: 2020-12-20 | Discharge: 2020-12-20 | Disposition: A | Discharge status: Home / Self Care | Payer: Medicare Other | Attending: Gastroenterology | Admitting: Gastroenterology

## 2020-12-20 ENCOUNTER — Encounter: Payer: Self-pay | Admitting: Gastroenterology

## 2020-12-20 ENCOUNTER — Telehealth: Payer: Self-pay

## 2020-12-20 DIAGNOSIS — Z596 Low income: Secondary | ICD-10-CM | POA: Diagnosis not present

## 2020-12-20 DIAGNOSIS — R1310 Dysphagia, unspecified: Secondary | ICD-10-CM | POA: Diagnosis not present

## 2020-12-20 DIAGNOSIS — Z79890 Hormone replacement therapy (postmenopausal): Secondary | ICD-10-CM | POA: Insufficient documentation

## 2020-12-20 DIAGNOSIS — Z8616 Personal history of COVID-19: Secondary | ICD-10-CM | POA: Insufficient documentation

## 2020-12-20 DIAGNOSIS — Z7982 Long term (current) use of aspirin: Secondary | ICD-10-CM | POA: Diagnosis not present

## 2020-12-20 DIAGNOSIS — B3781 Candidal esophagitis: Secondary | ICD-10-CM

## 2020-12-20 DIAGNOSIS — Z79899 Other long term (current) drug therapy: Secondary | ICD-10-CM | POA: Insufficient documentation

## 2020-12-20 HISTORY — PX: ESOPHAGOGASTRODUODENOSCOPY (EGD) WITH PROPOFOL: SHX5813

## 2020-12-20 SURGERY — ESOPHAGOGASTRODUODENOSCOPY (EGD) WITH PROPOFOL
Anesthesia: General

## 2020-12-20 MED ORDER — PROPOFOL 500 MG/50ML IV EMUL
INTRAVENOUS | Status: AC
  Filled 2020-12-20: qty 50

## 2020-12-20 MED ORDER — LIDOCAINE HCL (PF) 2 % IJ SOLN
INTRAMUSCULAR | Status: AC
  Filled 2020-12-20: qty 5

## 2020-12-20 MED ORDER — PROPOFOL 500 MG/50ML IV EMUL
INTRAVENOUS | Status: DC | PRN
  Administered 2020-12-20: 120 ug/kg/min via INTRAVENOUS

## 2020-12-20 MED ORDER — SODIUM CHLORIDE 0.9 % IV SOLN: INTRAVENOUS | Status: DC
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
## 2020-12-20 NOTE — Anesthesia Procedure Notes (Signed)
Performed by: Cook-Martin, Evelyn Moch Pre-anesthesia Checklist: Patient identified, Emergency Drugs available, Suction available, Patient being monitored and Timeout performed Patient Re-evaluated:Patient Re-evaluated prior to induction Oxygen Delivery Method: Nasal cannula Preoxygenation: Pre-oxygenation with 100% oxygen Induction Type: IV induction Placement Confirmation: positive ETCO2 and CO2 detector       

## 2020-12-20 NOTE — Anesthesia Postprocedure Evaluation (Signed)
Anesthesia Post Note  Patient: [name]  Procedure(s) Performed: ESOPHAGOGASTRODUODENOSCOPY (EGD) WITH PROPOFOL (N/A )  Patient location during evaluation: PACU Anesthesia Type: General Level of consciousness: awake and alert Pain management: pain level controlled Vital Signs Assessment: post-procedure vital signs reviewed and stable Respiratory status: spontaneous breathing, nonlabored ventilation and respiratory function stable Cardiovascular status: blood pressure returned to baseline and stable Postop Assessment: no apparent nausea or vomiting Anesthetic complications: no   No complications documented.   Last Vitals:  Vitals:   12/20/20 1003 12/20/20 1008  BP: 103/81 117/84  Pulse: 63 65  Resp: 15 13  Temp:    SpO2: 100% 100%    Last Pain:  Vitals:   12/20/20 1008  TempSrc:   PainSc: 0-No pain                 Aurelio Brash [place]

## 2020-12-20 NOTE — Op Note (Signed)
[facility] Gastroenterology Patient Name: [name] Procedure Date: 12/20/2020 9:26 AM MRN: [id number] Account #: [id number] Date of Birth: 27-Apr-1956 Admit Type: Outpatient Age: 65 Room: [facility] ENDO ROOM 4 Gender: Female Note Status: Finalized Procedure:             Upper GI endoscopy Indications:           Dysphagia Providers:             Wyline Mood MD, MD Referring MD:          Pasty Spillers Mclean-Scocuzza MD, MD (Referring MD) Medicines:             Monitored Anesthesia Care Complications:         No immediate complications. Procedure:             Pre-Anesthesia Assessment:                        - Prior to the procedure, a History and Physical was                         performed, and patient medications, allergies and                         sensitivities were reviewed. The patient's tolerance                         of previous anesthesia was reviewed.                        - The risks and benefits of the procedure and the                         sedation options and risks were discussed with the                         patient. All questions were answered and informed                         consent was obtained.                        - ASA Grade Assessment: II - A patient with mild                         systemic disease.                        After obtaining informed consent, the endoscope was                         passed under direct vision. Throughout the procedure,                         the patient's blood pressure, pulse, and oxygen                         saturations were monitored continuously. The Endoscope                         was introduced through the mouth, and  advanced to the                         third part of duodenum. The upper GI endoscopy was                         accomplished with ease. The patient tolerated the                         procedure well. Findings:      The esophagus was normal.      The stomach was  normal.      The examined duodenum was normal. Impression:            - Normal esophagus.                        - Normal stomach.                        - Normal examined duodenum.                        - No specimens collected. Recommendation:        - Discharge patient to home (with escort).                        - Resume previous diet.                        - Continue present medications.                        - I will order a modified barium swallow, barium                         swallow with tablet and if negative get esophageal                         manometry Procedure Code(s):     --- Professional ---                        [phone], Esophagogastroduodenoscopy, flexible,                         transoral; diagnostic, including collection of                         specimen(s) by brushing or washing, when performed                         (separate procedure) Diagnosis Code(s):     --- Professional ---                        R13.10, Dysphagia, unspecified CPT copyright 2019 American Medical Association. All rights reserved. The codes documented in this report are preliminary and upon coder review may  be revised to meet current compliance requirements. Wyline Mood, MD Wyline Mood MD, MD 12/20/2020 9:36:44 AM This report has been signed electronically. Number of Addenda: 0 Note Initiated On: 12/20/2020 9:26 AM Estimated Blood Loss:  Estimated blood loss: none.  [facility]

## 2020-12-20 NOTE — Transfer of Care (Signed)
Immediate Anesthesia Transfer of Care Note  Patient: [name]  Procedure(s) Performed: ESOPHAGOGASTRODUODENOSCOPY (EGD) WITH PROPOFOL (N/A )  Patient Location: PACU  Anesthesia Type:General  Level of Consciousness: awake and sedated  Airway & Oxygen Therapy: Patient Spontanous Breathing and Patient connected to nasal cannula oxygen  Post-op Assessment: Report given to RN and Post -op Vital signs reviewed and stable  Post vital signs: Reviewed and stable  Last Vitals:  Vitals Value Taken Time  BP    Temp    Pulse    Resp    SpO2      Last Pain:  Vitals:   12/20/20 0900  TempSrc: Temporal  PainSc: 0-No pain         Complications: No complications documented.

## 2020-12-20 NOTE — Telephone Encounter (Signed)
Informed patient that she has 2 special procedures scheduled in April. Gave patient the instructions for the procedures. Pt verbalized understanding.

## 2020-12-21 ENCOUNTER — Telehealth: Payer: Self-pay | Admitting: Internal Medicine

## 2020-12-21 NOTE — Telephone Encounter (Signed)
   Telephone encounter was:  Unsuccessful.  12/21/2020 Name: [name] MRN: [id number] DOB: 08/14/1956  Unsuccessful outbound call made today to assist with:  Transportation Needs  and Assistance with Medical bills.  Outreach Attempt:  2nd Attempt  A HIPAA compliant voice message was left requesting a return call.  Instructed patient to call back at [phone].  [facility] Care Guide, Embedded Care Coordination [facility], Care Management Phone: [phone] Email: [email or web address]

## 2020-12-24 ENCOUNTER — Telehealth: Payer: Self-pay | Admitting: Internal Medicine

## 2020-12-24 DIAGNOSIS — E039 Hypothyroidism, unspecified: Secondary | ICD-10-CM | POA: Diagnosis not present

## 2020-12-24 NOTE — Telephone Encounter (Signed)
   Telephone encounter was:  Successful.  12/24/2020 Name: [name] MRN: [id number] DOB: Jul 24, 1956  [name] is a 65 y.o. year old female who is a primary care patient of McLean-Scocuzza, Pasty Spillers, MD . The community resource team was consulted for assistance with Transportation Needs  and Assistance with Medical Bills.   Care guide performed the following interventions: Spoke with [name] regarding referral for transportiaton and assistance with medical bills. Patient stated she has already received assistance with transportiation, but she still needs assistance with medical bills. Informed patient about the [facility]. Patient stated that she is aware of the program, but is not able to print out the form. Informed patient that Care Guide have the form printed and she can pick it up from the front office staff. Patient stated she will stop by the office today to pick up the forms. Care Guide called [place] office to speak with front office receiption. They stated that they already have the [facility] application available in the office. Informed the staff that [name] will be stopping by today to pick up the application. No additional needs at this time. .  Follow Up Plan:  No further follow up planned at this time. The patient has been provided with needed resources.  [facility] Care Guide, Embedded Care Coordination [facility], Care Management Phone: [phone] Email: [email or web address]

## 2020-12-28 ENCOUNTER — Other Ambulatory Visit: Payer: Self-pay

## 2020-12-28 ENCOUNTER — Ambulatory Visit (INDEPENDENT_AMBULATORY_CARE_PROVIDER_SITE_OTHER): Payer: Medicare Other | Admitting: Internal Medicine

## 2020-12-28 ENCOUNTER — Encounter: Payer: Self-pay | Admitting: Internal Medicine

## 2020-12-28 VITALS — BP 118/78 | HR 80 | Temp 97.8°F | Ht 65.0 in | Wt 167.8 lb

## 2020-12-28 DIAGNOSIS — E2839 Other primary ovarian failure: Secondary | ICD-10-CM | POA: Diagnosis not present

## 2020-12-28 DIAGNOSIS — M810 Age-related osteoporosis without current pathological fracture: Secondary | ICD-10-CM

## 2020-12-28 DIAGNOSIS — F339 Major depressive disorder, recurrent, unspecified: Secondary | ICD-10-CM

## 2020-12-28 DIAGNOSIS — R0982 Postnasal drip: Secondary | ICD-10-CM

## 2020-12-28 DIAGNOSIS — R413 Other amnesia: Secondary | ICD-10-CM

## 2020-12-28 DIAGNOSIS — R4189 Other symptoms and signs involving cognitive functions and awareness: Secondary | ICD-10-CM

## 2020-12-28 DIAGNOSIS — I10 Essential (primary) hypertension: Secondary | ICD-10-CM

## 2020-12-28 DIAGNOSIS — R519 Headache, unspecified: Secondary | ICD-10-CM

## 2020-12-28 DIAGNOSIS — M5441 Lumbago with sciatica, right side: Secondary | ICD-10-CM

## 2020-12-28 DIAGNOSIS — Z1231 Encounter for screening mammogram for malignant neoplasm of breast: Secondary | ICD-10-CM | POA: Diagnosis not present

## 2020-12-28 DIAGNOSIS — G8929 Other chronic pain: Secondary | ICD-10-CM

## 2020-12-28 DIAGNOSIS — M25551 Pain in right hip: Secondary | ICD-10-CM

## 2020-12-28 DIAGNOSIS — R1013 Epigastric pain: Secondary | ICD-10-CM

## 2020-12-28 DIAGNOSIS — F419 Anxiety disorder, unspecified: Secondary | ICD-10-CM
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
## 2020-12-28 NOTE — Patient Instructions (Addendum)
Melatonin up to 10 mg for sleep  L theanine 100-200 mg for sleep   Can stop norvasc 2.5 or amlodipine for now but if blood >130/>80 resume Stop flexeril/cyclobenzaprine for now   Nasal saline 2 sprays each nose daily as needed   Ultrasound of your abdomen   Call and schedule bone density and mammogram   Let me know who your insurance will cover for psychiatry please

## 2020-12-31 ENCOUNTER — Other Ambulatory Visit: Payer: Self-pay

## 2020-12-31 ENCOUNTER — Encounter: Payer: Self-pay | Admitting: Internal Medicine

## 2020-12-31 DIAGNOSIS — Z01419 Encounter for gynecological examination (general) (routine) without abnormal findings: Secondary | ICD-10-CM | POA: Diagnosis not present

## 2020-12-31 DIAGNOSIS — N393 Stress incontinence (female) (male): Secondary | ICD-10-CM | POA: Diagnosis not present

## 2020-12-31 DIAGNOSIS — Z1231 Encounter for screening mammogram for malignant neoplasm of breast: Secondary | ICD-10-CM

## 2021-01-01 ENCOUNTER — Other Ambulatory Visit: Payer: Self-pay

## 2021-01-01 ENCOUNTER — Ambulatory Visit
Admission: RE | Admit: 2021-01-01 | Discharge: 2021-01-01 | Disposition: A | Discharge status: Home / Self Care | Payer: Medicare Other | Source: Ambulatory Visit | Attending: Gastroenterology | Admitting: Gastroenterology

## 2021-01-01 DIAGNOSIS — K219 Gastro-esophageal reflux disease without esophagitis: Secondary | ICD-10-CM | POA: Diagnosis not present

## 2021-01-01 DIAGNOSIS — R1310 Dysphagia, unspecified: Secondary | ICD-10-CM | POA: Diagnosis not present

## 2021-01-01 NOTE — Progress Notes (Signed)
Inform barium study shows a lot of spasms which can cause dysphagia- suggest esophageal manometry to be arranged at [facility] and follow up after

## 2021-01-02 ENCOUNTER — Ambulatory Visit: Payer: Medicare Other | Attending: Gastroenterology

## 2021-01-03 ENCOUNTER — Other Ambulatory Visit: Payer: Self-pay

## 2021-01-03 DIAGNOSIS — B3781 Candidal esophagitis: Secondary | ICD-10-CM

## 2021-01-04 ENCOUNTER — Other Ambulatory Visit: Payer: Self-pay

## 2021-01-04 ENCOUNTER — Emergency Department: Payer: Medicare Other

## 2021-01-04 ENCOUNTER — Encounter: Payer: Self-pay | Admitting: Emergency Medicine

## 2021-01-04 ENCOUNTER — Emergency Department
Admission: EM | Admit: 2021-01-04 | Discharge: 2021-01-04 | Disposition: A | Discharge status: Home / Self Care | Payer: Medicare Other | Attending: Physician Assistant | Admitting: Physician Assistant

## 2021-01-04 DIAGNOSIS — E039 Hypothyroidism, unspecified: Secondary | ICD-10-CM | POA: Diagnosis not present

## 2021-01-04 DIAGNOSIS — Z79899 Other long term (current) drug therapy: Secondary | ICD-10-CM | POA: Insufficient documentation

## 2021-01-04 DIAGNOSIS — J4530 Mild persistent asthma, uncomplicated: Secondary | ICD-10-CM | POA: Diagnosis not present

## 2021-01-04 DIAGNOSIS — H53149 Visual discomfort, unspecified: Secondary | ICD-10-CM | POA: Diagnosis not present

## 2021-01-04 DIAGNOSIS — R519 Headache, unspecified: Secondary | ICD-10-CM | POA: Diagnosis not present

## 2021-01-04 DIAGNOSIS — I10 Essential (primary) hypertension: Secondary | ICD-10-CM | POA: Insufficient documentation

## 2021-01-04 DIAGNOSIS — Z7982 Long term (current) use of aspirin: Secondary | ICD-10-CM | POA: Diagnosis not present

## 2021-01-04 DIAGNOSIS — Z8616 Personal history of COVID-19: Secondary | ICD-10-CM | POA: Insufficient documentation

## 2021-01-04 MED ORDER — METOCLOPRAMIDE HCL 5 MG/ML IJ SOLN
20.0000 mg | Freq: Once | INTRAVENOUS | Status: AC
  Administered 2021-01-04: 20 mg via INTRAVENOUS
  Filled 2021-01-04: qty 4

## 2021-01-04 MED ORDER — SODIUM CHLORIDE 0.9 % IV BOLUS
1000.0000 mL | Freq: Once | INTRAVENOUS | Status: AC
  Administered 2021-01-04: 1000 mL via INTRAVENOUS

## 2021-01-04 MED ORDER — KETOROLAC TROMETHAMINE 30 MG/ML IJ SOLN
30.0000 mg | Freq: Once | INTRAMUSCULAR | Status: AC
  Administered 2021-01-04: 30 mg via INTRAVENOUS
  Filled 2021-01-04: qty 1

## 2021-01-04 MED ORDER — ONDANSETRON HCL 4 MG/2ML IJ SOLN
4.0000 mg | Freq: Once | INTRAMUSCULAR | Status: AC
  Administered 2021-01-04: 4 mg via INTRAVENOUS
  Filled 2021-01-04: qty 2

## 2021-01-04 MED ORDER — BUTALBITAL-APAP-CAFFEINE 50-325-40 MG PO TABS: 1.0000 | ORAL_TABLET | Freq: Four times a day (QID) | ORAL | 0 refills | Status: DC | PRN

## 2021-01-04 NOTE — ED Triage Notes (Signed)
Presents with left sided h/a since yesterday  No fever or n/v

## 2021-01-04 NOTE — Discharge Instructions (Addendum)
No acute findings on CT of the head.  Continue taking blood pressure medication.  Follow discharge care instructions and take medication as needed for headache.
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
## 2021-01-07 ENCOUNTER — Encounter: Payer: Self-pay | Admitting: Internal Medicine

## 2021-01-07 ENCOUNTER — Telehealth: Payer: Self-pay | Admitting: Internal Medicine

## 2021-01-07 DIAGNOSIS — K224 Dyskinesia of esophagus: Secondary | ICD-10-CM | POA: Insufficient documentation

## 2021-01-07 DIAGNOSIS — K219 Gastro-esophageal reflux disease without esophagitis: Secondary | ICD-10-CM | POA: Insufficient documentation

## 2021-01-07 NOTE — Telephone Encounter (Signed)
Fax to [facility] neurology Dr. Sherryll Burger pt having h/as can you address in a visit please?  Thank you

## 2021-01-08 ENCOUNTER — Encounter: Payer: Self-pay | Admitting: Pulmonary Disease

## 2021-01-08 NOTE — Telephone Encounter (Signed)
Faxed via epic routing
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
## 2021-01-15 ENCOUNTER — Ambulatory Visit (INDEPENDENT_AMBULATORY_CARE_PROVIDER_SITE_OTHER): Payer: Medicare Other | Admitting: Pharmacist

## 2021-01-15 DIAGNOSIS — G4700 Insomnia, unspecified: Secondary | ICD-10-CM

## 2021-01-15 DIAGNOSIS — I10 Essential (primary) hypertension: Secondary | ICD-10-CM

## 2021-01-15 DIAGNOSIS — F339 Major depressive disorder, recurrent, unspecified: Secondary | ICD-10-CM

## 2021-01-15 DIAGNOSIS — N3281 Overactive bladder: Secondary | ICD-10-CM

## 2021-01-15 DIAGNOSIS — J4530 Mild persistent asthma, uncomplicated: Secondary | ICD-10-CM

## 2021-01-15 NOTE — Patient Instructions (Addendum)
[name],   It was great talking with you today! I am mailing you this information as well as sending through MyChart.   Through our conversation, I believe you are taking:  - Amlodipine 2.5 mg - blood pressure - Levothyroxine 175 mcg - thyroid supplement - Omeprazole 40 mg - once or twice daily, acid reflux - Trazodone 1/2 - 1 tablet (25-50 mg) alternating with melatonin - sleep  The supplements you are taking are: Multivitamin, Probiotic, Vitamin C, Vitamin B12, Vitamin D, Turmeric, calcium, Goli  You are not taking:  - Toviaz - overactive bladder. Discuss your symptoms with Dr. Elesa Massed at your upcoming appointment - Sertraline (new medication), lorazepam (Ativan) - depression/anxiety. Sertraline is not anticipated to make you sleepy/groggy like lorazepam can. Sertraline needs to be taken every day for 4-8 weeks to see full benefit. Consider if you want to try this to see if this helps improve your mood/brain fog - Any inhalers - Fioricet (butalbital/acetaminophen/caffeine) - given to you at the ED for headaches. This one should not be needed or used regularly.  - Famotidine - also for acid reflux. Discuss with Dr. Tobi Bastos about whether he wants you on just omeprazole or omeprazole + famotidine for your symptoms.   Please check your blood pressure about once weekly and write this down for Dr. French Ana.   I will talk to Dr. French Ana and Joanne Gavel about the referral for a therapist at [facility].   Please let me know if any of the above isn't correct once you look at your pill bottles!  Thanks!  [name], PharmD [phone]

## 2021-01-15 NOTE — Chronic Care Management (AMB) (Signed)
  Chronic Care Management Pharmacy Note  01/15/2021 Name:  [name] MRN:  [id number] DOB:  01/28/1956  Subjective: [name] is an 64 y.o. year old female who is a primary patient of McLean-Scocuzza, Tracy N, MD.  The CCM team was consulted for assistance with disease management and care coordination needs.    Engaged with patient by telephone for follow up visit in response to provider referral for pharmacy case management and/or care coordination services.   Consent to Services:  The patient was given information about Chronic Care Management services, agreed to services, and gave verbal consent prior to initiation of services.  Please see initial visit note for detailed documentation.   Patient Care Team: McLean-Scocuzza, Tracy N, MD as PCP - General (Internal Medicine) Travis, Catherine E, RPH-CPP (Pharmacist)  Recent office visits:  4/8 - PCP - ordered US abdomen, start sertraline, investigate in network psych  Recent consult visits:  3/31 - repeat EGD - normal, ordered barium swallow.   4/4 - neurology Autumn Konz, started sertraline 25 mg daily. Patient has not started  4/11- GYN well woman exam- return for f/u with Dr. Ward for urinary leakage  4/12 - barium study - showed spasms, referral to esophageal manometry   4/15 - ED visit for headache, possibly related to stopping amlodipine and having higher BP. Patient restarted  Hospital visits: None  Objective:  Lab Results  Component Value Date   CREATININE 0.71 08/24/2020   CREATININE 0.70 10/04/2019   CREATININE 0.60 07/15/2019    Lab Results  Component Value Date   HGBA1C 5.6 11/30/2019   Last diabetic Eye exam: No results found for: HMDIABEYEEXA  Last diabetic Foot exam: No results found for: HMDIABFOOTEX      Component Value Date/Time   CHOL 189 10/30/2020 1053   TRIG 173.0 (H) 10/30/2020 1053   HDL 63.70 10/30/2020 1053   CHOLHDL 3 10/30/2020 1053   VLDL 34.6 10/30/2020 1053   LDLCALC  90 10/30/2020 1053    Hepatic Function Latest Ref Rng & Units 10/30/2020 11/30/2019 07/15/2019  Total Protein 6.0 - 8.3 g/dL 7.0 7.2 7.7  Albumin 3.5 - 5.2 g/dL 4.3 4.2 4.2  AST 0 - 37 U/L 26 24 93(H)  ALT 0 - 35 U/L 27 23 121(H)  Alk Phosphatase 39 - 117 U/L 73 60 60  Total Bilirubin 0.2 - 1.2 mg/dL 0.5 0.6 0.5  Bilirubin, Direct 0.0 - 0.3 mg/dL 0.1 0.1 -    Lab Results  Component Value Date/Time   TSH 2.69 10/30/2020 10:53 AM   TSH 2.04 02/06/2020 01:34 PM    CBC Latest Ref Rng & Units 08/24/2020 10/04/2019 07/15/2019  WBC 4.0 - 10.5 K/uL 7.6 7.0 3.8(L)  Hemoglobin 12.0 - 15.0 g/dL 12.7 13.1 12.5  Hematocrit 36.0 - 46.0 % 38.0 39.4 36.3  Platelets 150 - 400 K/uL 228 266 196    Lab Results  Component Value Date/Time   VD25OH 52.95 11/30/2019 11:02 AM    Clinical ASCVD: No  The 10-year ASCVD risk score (Goff DC Jr., et al., 2013) is: 4.2%   Values used to calculate the score:     Age: 64 years     Sex: Female     Is Non-Hispanic African American: No     Diabetic: No     Tobacco smoker: No     Systolic Blood Pressure: 120 mmHg     Is BP treated: Yes     HDL Cholesterol: 63.7 mg/dL       Total Cholesterol: 189 mg/dL    Social History   Tobacco Use  Smoking Status Never Smoker  Smokeless Tobacco Never Used   BP Readings from Last 3 Encounters:  01/04/21 120/88  12/28/20 118/78  12/20/20 117/84   Pulse Readings from Last 3 Encounters:  01/04/21 70  12/28/20 80  12/20/20 65   Wt Readings from Last 3 Encounters:  12/28/20 167 lb 12.8 oz (76.1 kg)  12/20/20 162 lb (73.5 kg)  12/03/20 167 lb (75.8 kg)    Assessment: Review of patient past medical history, allergies, medications, health status, including review of consultants reports, laboratory and other test data, was performed as part of comprehensive evaluation and provision of chronic care management services.   SDOH:  (Social Determinants of Health) assessments and interventions performed:  SDOH  Interventions   Flowsheet Row Most Recent Value  SDOH Interventions   Stress Interventions Other (Comment)  [discussed therapy referral]      CCM Care Plan  No Known Allergies  Medications Reviewed Today    Reviewed by Travis, Catherine E, RPH-CPP (Pharmacist) on 01/15/21 at 0934  Med List Status: <None>  Medication Order Taking? Sig Documenting Provider Last Dose Status Informant  amLODipine (NORVASC) 2.5 MG tablet [id number] Yes Take 2.5 mg by mouth daily. [provider] Taking Active   APPLE CIDER VINEGAR PO [id number] Yes Take 1 tablet by mouth daily. [provider] Taking Active   Ascorbic Acid (VITAMIN C PO) [id number] Yes Take 1,000 mg by mouth daily. [provider] Taking Active   butalbital-acetaminophen-caffeine (FIORICET) 50-325-40 MG tablet [id number] No Take 1 tablet by mouth every 6 (six) hours as needed for headache.  Patient not taking: Reported on 01/15/2021   Smith, Ronald K, PA-C Not Taking Active   CALCIUM CITRATE PO [id number] Yes Take 600 mg by mouth daily. [provider] Taking Active   Cyanocobalamin (B-12 PO) [id number] Yes Take 1 tablet by mouth daily. [provider] Taking Active   famotidine (PEPCID) 20 MG tablet [id number] No Take 1 tablet (20 mg total) by mouth at bedtime.  Patient not taking: Reported on 01/15/2021   McLean-Scocuzza, Tracy N, MD Not Taking Active   fesoterodine (TOVIAZ) 8 MG TB24 tablet [id number] No Take 1 tablet (8 mg total) by mouth daily. Mail order samples drug co approved  Patient not taking: Reported on 01/15/2021   McLean-Scocuzza, Tracy N, MD Not Taking Active   levothyroxine (SYNTHROID) 175 MCG tablet [id number] Yes Take 1 tablet (175 mcg total) by mouth daily before breakfast. 30 min before food McLean-Scocuzza, Tracy N, MD Taking Active   LORazepam (ATIVAN) 1 MG tablet [id number] No Take 0.5 tablets (0.5 mg total) by mouth daily as needed for anxiety.  Patient not taking: Reported on  01/15/2021   McLean-Scocuzza, Tracy N, MD Not Taking Active   MELATONIN PO [id number] Yes Take 1 tablet by mouth daily. [provider] Taking Active            Med Note (TRAVIS, CATHERINE E   Tue Jan 15, 2021  9:29 AM)    Multiple Vitamins-Calcium (ONE-A-DAY WOMENS FORMULA PO) [id number] Yes Take 1 capsule by mouth daily. [provider] Taking Active   Omega-3 Fatty Acids (OMEGA 3 PO) [id number] No Take 1 capsule by mouth daily.  Patient not taking: Reported on 01/15/2021   [provider] Not Taking Active   omeprazole (PRILOSEC) 40 MG capsule [id number] Yes Take 1 capsule (40 mg total) by mouth in the morning and at bedtime.   Jonathon Bellows, MD Taking Active   Probiotic Product (PROBIOTIC DAILY PO) [id number] Yes Take by mouth. [provider] Taking Active   sertraline (ZOLOFT) 25 MG tablet [id number] No Take 25 mg by mouth daily.  Patient not taking: Reported on 01/15/2021   [provider] Not Taking Active   traZODone (DESYREL) 50 MG tablet [id number] Yes Take 0.5-1 tablets (25-50 mg total) by mouth at bedtime as needed for sleep. McLean-Scocuzza, Nino Glow, MD Taking Active   TURMERIC-GINGER PO [id number] Yes Take 1 each by mouth daily. [provider] Taking Active   VITAMIN D, CHOLECALCIFEROL, PO [id number] Yes Take 5,000 mg by mouth daily at 12 noon. [provider] Taking Active           Patient Active Problem List   Diagnosis Date Noted  . Esophageal spasm 01/07/2021  . GERD (gastroesophageal reflux disease) 01/07/2021  . Esophageal candidiasis ([place]) 12/19/2020  . 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 Insomnia 10/30/2020  . Mild persistent asthma 10/30/2020  . Esophagitis 10/30/2020  . Brain fog 09/28/2020  . Trochanteric bursitis of right hip 08/22/2020  . Anxiety 06/15/2020  . Overactive bladder 06/15/2020  . Bronchospasm 06/15/2020  . Allergic rhinitis 06/15/2020  . Dysphagia 01/25/2020  . H/O  bilateral breast implants 10/28/2019  . 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 Essential hypertension 10/28/2019  . Breast pain, left 10/28/2019  . Chronic right hip pain 10/28/2019  . Osteoporosis 10/28/2019  . Shortness of breath 10/28/2019  . S/P lumbar fusion 10/28/2019  . History of colon polyps 10/28/2019  . Hypothyroidism   . Depression, recurrent ([place])     Immunization History  Administered Date(s) Administered  . Hepb-cpg 12/06/2019, 02/06/2020  . Influenza,inj,Quad PF,6+ Mos 10/13/2018, 09/23/2019, 07/03/2020  . PFIZER(Purple Top)SARS-COV-2 Vaccination 12/24/2019, 01/15/2020, 09/05/2020  . Pneumococcal Polysaccharide-23 10/13/2018    Conditions to be addressed/monitored: HTN, Anxiety, Depression and Pulmonary Disease  Care Plan : Medication Management  Updates made by De Hollingshead, RPH-CPP since 01/15/2021 12:00 AM    Problem: Reactive Airway, HTN, Dysphagia, Anxiety     Long-Range Goal: Disease Progression Prevention   Start Date: 10/29/2020  This Visit's Progress: On track  Recent Progress: On track  Priority: High  Note:   Current Barriers:  . Unable to independently afford treatment regimen . Unable to achieve control of dysphagia  . Low level of health literacy in regard to medication indications  Pharmacist Clinical Goal(s):  Marland Kitchen Over the next 90 days, patient will verbalize ability to afford treatment regimen . achieve adherence to monitoring guidelines and medication adherence to achieve therapeutic efficacy through collaboration with PharmD and provider.   Interventions: . 1:1 collaboration with McLean-Scocuzza, Nino Glow, MD regarding development and update of comprehensive plan of care as evidenced by provider attestation and co-signature . Inter-disciplinary care team collaboration (see longitudinal plan of care) . Comprehensive medication review performed; medication list updated in electronic medical  record  Health Maintenance/SDOH: . Care Guide referral previously placed for financial and transportation support. Does not appear they made contact with patient.  . Patient has not been checking My Chart lately. Reviewed recent messages with her from GI, PCP . Not using pill box. Not home right now to review pill bottles. Patient is to review my MyChart message with her bottles when she is home and respond.   Hypertension: . Uncontrolled per last clinic reading, goal <130/80; current treatment: amlodipine 2.5 mg daily - had stopped at last PCP  visit, but subsequent ED visit for headache that they believe was related to higher BP. Patient restarted amlodipine 2.5 mg . Home BP readings: patient is not checking. Reports she receives conflicting information about how often she should be checking.  . Advised to check BP at home ~ once per week, document, and provide to PCP in the future.   Asthma/Reactive Airway disease: . Improved; current treatment: none o Hx esophageal thrush, may have been related to inhaled and/or nasal steroid o Also stopped albuterol HFA  . Last PFT: indicates reactive airway disease.  . Approved for [employer] assistance, if needed in the future.  . Reports that her breathing has been better lately. Downstairs neighbor has not been smoking as often. Denies need for any pharmacotherapy for breathing.  . Continue current plan. Monitor for changes in breathing that would require re-initiation of pharmacotherapy.   Dysphagia, ?GERD: . Uncontrolled; currently taking omeprazole 40 mg BID- but down to once daily now to conserve supply, famotidine 20 mg QPM. She thinks she is just taking omeprazole right now, she wasn't sure what famotidine was for  . S/p endoscopy w/ Dr. Anna w/ second round of candida treatment with fluconzole. Barium swallow indicated spasms, esophageal manometry ordered. Patient wants to know what this means.  . F/u with Dr. Anna on Thursday as scheduled.  Encouraged her to discuss intended regimen and questions about future treatment with Dr. Anna  Hypothyroidism: . Controlled per last TSH; current regimen: levothyroxine 175 mcg daily.  . Reviewed to separate this medication from other medications, particularly her vitamins/supplements.  . Continue current regimen at this time  Bursitis, Chronic Hip/Back pain: . Uncontrolled; current treatment: none; follows w/ PM&R at [facility].  . Previously on cyclobenzaprine, but patient trying to avoid sedating medications  OAB: . Uncontrolled but patient tolerating; current treatment: none. Opted to stop all unnecessary medications to see if contributing to brain fod o Previously tried:Toviaz 8 mg- notes improvement in OAB symptoms but some headache at night; approved for [employer] assistance for this through 09/21/21 . At most recent well woman visit w/ [facility], plan to f/u with Dr. Ward regarding urinary symptoms. Encouraged to discuss non-sedating and non-anticholinergic medications with her at that time  Anxiety/Insomnia: . Improved per patient report; current regimen: melatonin 1 mg QPM, trazodone 25-50 mg QPM - alternates melatonin and trazodone with benefit. Has not started sertraline 25 mg tab prescribed by neurology. Also has lorazepam 0.5 mg PRN anxiety, but is trying to avoid use recently d/t sedative effect.  . Discussed benefit of SSRI if brain fog related to depression/anxiety. Discussed that SSRI may take 4-8 weeks to perceive full benefit. Agree with sertraline vs other sedating/anticholinergic antidepressants. Patient will contemplate taking.  . Patient notes that she thinks the therapists suggested by her insurance are at [facility]. Will collaborate w/ PCP to place that referral .  Supplement: . Reports use of Calcium citrate 600 mg, Vitamin D 5000 units daily, Vitamin C, Vitamin B12, Vitamin E, melatonin, multivitamin, probiotic, turmeric . Previously  discussed that if she is interested in reducing the number of pills she takes, evaluating necessity of supplemental therapy may be appropriate. Advised discontinuation of anything not specifically recommended by a provider. She will consider.    Patient Goals/Self-Care Activities . Over the next 90 days, patient will:  - take medications as prescribed focus on medication adherence by using pill box collaborate with provider on medication access solutions  Follow Up Plan: Telephone follow   up appointment with care management team member scheduled for: ~ 8 weeks       Medication Assistance: None required.  Patient affirms current coverage meets needs.  Patient's preferred pharmacy is:  [facility] [address] [place] - [place] [address] Phone: [phone] Fax: [phone]   Follow Up:  Patient agrees to Care Plan and Follow-up.  Plan: Telephone follow up appointment with care management team member scheduled for:  ~ 8 weeks  [name], PharmD, [place], [place] Clinical Pharmacist [employer] [facility] [employer] [phone]

## 2021-01-16 ENCOUNTER — Telehealth: Payer: Self-pay | Admitting: Internal Medicine

## 2021-01-16 NOTE — Telephone Encounter (Signed)
PT is wanting to have a callback in regards to a upcoming appointment as she is not sure what it is for. If its a procedure or what and when it is suppose to be and what time.

## 2021-01-16 NOTE — Telephone Encounter (Signed)
Patient had imaging done with Dr Tobi Bastos and will need further imaging. This has been ordered but not scheduled. Results from Dr Tobi Bastos are in [place] but no call as of yet.   Informed Patient to call Dr Johnney Killian office and she will do so.

## 2021-01-16 NOTE — Addendum Note (Signed)
Addended by: Quentin Ore on: 01/16/2021 09:15 PM   Modules accepted: Orders

## 2021-01-17 ENCOUNTER — Encounter: Payer: Self-pay | Admitting: Internal Medicine

## 2021-01-17 ENCOUNTER — Ambulatory Visit (INDEPENDENT_AMBULATORY_CARE_PROVIDER_SITE_OTHER): Payer: Medicare Other

## 2021-01-17 ENCOUNTER — Other Ambulatory Visit: Payer: Self-pay

## 2021-01-17 ENCOUNTER — Telehealth (INDEPENDENT_AMBULATORY_CARE_PROVIDER_SITE_OTHER): Payer: Medicare Other | Admitting: Gastroenterology

## 2021-01-17 ENCOUNTER — Ambulatory Visit (INDEPENDENT_AMBULATORY_CARE_PROVIDER_SITE_OTHER): Payer: Medicare Other | Admitting: Internal Medicine

## 2021-01-17 VITALS — BP 110/68 | HR 82 | Temp 98.2°F | Ht 65.0 in | Wt 165.4 lb

## 2021-01-17 DIAGNOSIS — R200 Anesthesia of skin: Secondary | ICD-10-CM

## 2021-01-17 DIAGNOSIS — M25551 Pain in right hip: Secondary | ICD-10-CM

## 2021-01-17 DIAGNOSIS — R1310 Dysphagia, unspecified: Secondary | ICD-10-CM | POA: Diagnosis not present

## 2021-01-17 DIAGNOSIS — M533 Sacrococcygeal disorders, not elsewhere classified: Secondary | ICD-10-CM | POA: Diagnosis not present

## 2021-01-17 DIAGNOSIS — M62838 Other muscle spasm: Secondary | ICD-10-CM

## 2021-01-17 DIAGNOSIS — Z981 Arthrodesis status: Secondary | ICD-10-CM

## 2021-01-17 DIAGNOSIS — W19XXXA Unspecified fall, initial encounter: Secondary | ICD-10-CM | POA: Diagnosis not present

## 2021-01-17 DIAGNOSIS — K224 Dyskinesia of esophagus: Secondary | ICD-10-CM

## 2021-01-17 DIAGNOSIS — G8929 Other chronic pain: Secondary | ICD-10-CM

## 2021-01-17 DIAGNOSIS — M5441 Lumbago with sciatica, right side: Secondary | ICD-10-CM | POA: Diagnosis not present

## 2021-01-17 DIAGNOSIS — R202 Paresthesia of skin: Secondary | ICD-10-CM

## 2021-01-17 MED ORDER — KETOROLAC TROMETHAMINE 60 MG/2ML IM SOLN
60.0000 mg | Freq: Once | INTRAMUSCULAR | Status: AC
  Administered 2021-01-17: 60 mg via INTRAMUSCULAR

## 2021-01-17 MED ORDER — CYCLOBENZAPRINE HCL 5 MG PO TABS: 5.0000 mg | ORAL_TABLET | Freq: Every evening | ORAL | 0 refills | Status: DC | PRN

## 2021-01-17 NOTE — Progress Notes (Signed)
Wyline Mood , MD [address]  [address]  [place], [place] [address]  Main: [phone]  Fax: [phone]   Primary Care Physician: McLean-Scocuzza, Pasty Spillers, MD  Virtual Visit via Video Note  I connected with patient on 01/17/21 at  3:15 PM EDT by video and verified that I am speaking with the correct person using two identifiers.   I discussed the limitations, risks, security and privacy concerns of performing an evaluation and management service by video  and the availability of in person appointments. I also discussed with the patient that there may be a patient responsible charge related to this service. The patient expressed understanding and agreed to proceed.  Location of Patient: Home Location of Provider: Home Persons involved: Patient and provider only   History of Present Illness:   Dysphagia follow-up  HPI: [name] is a 65 y.o. [name]  Summary of history :  Initially referred and seen on 10/15/2020 for dysphagia for a few months at every meal, solids> liquids , no weight loss , she had been on Prilosec 40 mg . She presented to the ER on 08/24/2020 with difficulty swallowing , referred to see me as an OP.She states that she was seen by ENT had a laryngoscopy and was told she had no abnormalities and needed to pursue GI evaluation.  09/23/2020: EGD: Features of esophageal candida treated with Diflucan .  KOH stain showed budding yeast   Interval history  12/03/2020-01/17/2021  I treated her with a total of 4 weeks of Diflucan for candidiasis seen on her endoscopy in January.  I repeated her EGD in March to confirm that the candidiasis had resolved as she was having recurrence of dysphagia and noted no abnormalities.  I subsequently ordered a barium test on 01/01/2021 with tablet which showed tertiary contractions of the distal half of the esophagus and mild gerd.   She has been having on and off issues with swallowing.  Not as bad as what it was  previously.   Current Outpatient Medications  Medication Sig Dispense Refill  . amLODipine (NORVASC) 2.5 MG tablet Take 2.5 mg by mouth daily.    . APPLE CIDER VINEGAR PO Take 1 tablet by mouth daily.    . Ascorbic Acid (VITAMIN C PO) Take 1,000 mg by mouth daily.    . butalbital-acetaminophen-caffeine (FIORICET) 50-325-40 MG tablet Take 1 tablet by mouth every 6 (six) hours as needed for headache. (Patient not taking: Reported on 01/17/2021) 20 tablet 0  . CALCIUM CITRATE PO Take 600 mg by mouth daily.    . Cyanocobalamin (B-12 PO) Take 1 tablet by mouth daily.    . cyclobenzaprine (FLEXERIL) 5 MG tablet Take 1 tablet (5 mg total) by mouth at bedtime as needed for muscle spasms. 30 tablet 0  . fesoterodine (TOVIAZ) 8 MG TB24 tablet Take 1 tablet (8 mg total) by mouth daily. Mail order samples drug co approved (Patient not taking: Reported on 01/17/2021) 90 tablet   . levothyroxine (SYNTHROID) 175 MCG tablet Take 1 tablet (175 mcg total) by mouth daily before breakfast. 30 min before food 90 tablet 3  . MELATONIN PO Take 1 tablet by mouth daily.    . Multiple Vitamins-Calcium (ONE-A-DAY WOMENS FORMULA PO) Take 1 capsule by mouth daily.    Marland Kitchen omeprazole (PRILOSEC) 40 MG capsule Take 1 capsule (40 mg total) by mouth in the morning and at bedtime. 60 capsule 2  . Probiotic Product (PROBIOTIC DAILY PO) Take by mouth.    Marland Kitchen  traZODone (DESYREL) 50 MG tablet Take 0.5-1 tablets (25-50 mg total) by mouth at bedtime as needed for sleep. 90 tablet 3  . TURMERIC-GINGER PO Take 1 each by mouth daily.    Marland Kitchen VITAMIN D, CHOLECALCIFEROL, PO Take 5,000 mg by mouth daily at 12 noon.     No current facility-administered medications for this visit.    Allergies as of 01/17/2021  . (No Known Allergies)    Review of Systems:    All systems reviewed and negative except where noted in HPI.  General Appearance:    Alert, cooperative, no distress, appears stated age  Head:    Normocephalic, without obvious  abnormality, atraumatic  Eyes:    PERRL, conjunctiva/corneas clear,  Ears:    Grossly normal hearing    Neurologic:  Grossly normal    Observations/Objective:  Labs: CMP     Component Value Date/Time   NA 139 08/24/2020 1627   K 4.1 08/24/2020 1627   CL 104 08/24/2020 1627   CO2 23 08/24/2020 1627   GLUCOSE 152 (H) 08/24/2020 1627   BUN 24 (H) 08/24/2020 1627   CREATININE 0.71 08/24/2020 1627   CALCIUM 10.0 08/24/2020 1627   PROT 7.0 10/30/2020 1053   ALBUMIN 4.3 10/30/2020 1053   AST 26 10/30/2020 1053   ALT 27 10/30/2020 1053   ALKPHOS 73 10/30/2020 1053   BILITOT 0.5 10/30/2020 1053   GFRNONAA >60 08/24/2020 1627   GFRAA >60 10/04/2019 1524   Lab Results  Component Value Date   WBC 7.6 08/24/2020   HGB 12.7 08/24/2020   HCT 38.0 08/24/2020   MCV 87.4 08/24/2020   PLT 228 08/24/2020    Imaging Studies: CT Head Wo Contrast  Result Date: 01/04/2021 CLINICAL DATA:  Persistent left-sided headaches since yesterday. No injury. EXAM: CT HEAD WITHOUT CONTRAST TECHNIQUE: Contiguous axial images were obtained from the base of the skull through the vertex without intravenous contrast. COMPARISON:  Brain MRI, 12/19/2020. FINDINGS: Brain: No evidence of acute infarction, hemorrhage, hydrocephalus, extra-axial collection or mass lesion/mass effect. Vascular: No hyperdense vessel or unexpected calcification. Skull: Normal. Negative for fracture or focal lesion. Sinuses/Orbits: Visualized globes and orbits are unremarkable. Visualized sinuses are clear. Other: None. IMPRESSION: Normal unenhanced CT scan of the brain. Electronically Signed   By: Amie Portland M.D.   On: 01/04/2021 12:09   MR BRAIN WO CONTRAST  Result Date: 12/20/2020 CLINICAL DATA:  Cognitive impairment EXAM: MRI HEAD WITHOUT CONTRAST TECHNIQUE: Multiplanar, multiecho pulse sequences of the brain and surrounding structures were obtained without intravenous contrast. Additionally, using NeuroQuant software a 3D  volumetric analysis of the brain was performed and is compared to a normative database adjusted for age, gender and intracranial volume. COMPARISON:  None. FINDINGS: Brain: Ventricle size normal. No focal lobar atrophy. Negative for acute infarct, hemorrhage, mass. Small white matter hyperintensities bilaterally, mild. Hyperintensity in the posterior limb internal capsule on the right compatible with chronic ischemia. Vascular: Normal arterial flow voids. Skull and upper cervical spine: Negative Sinuses/Orbits: None Other: None NeuroQuant Findings: Volumetric analysis of the brain was performed, with a fully detailed report in [employer]. Briefly, the comparison with age and gender matched reference reveals cerebral volume within normal limits for age. IMPRESSION: 1. Cerebral volume within normal limits for age. 2. Mild white matter changes consistent with chronic microvascular ischemia. 3. NeuroQuant volumetric analysis of the brain, see details on [employer]. Electronically Signed   By: Marlan Palau M.D.   On: 12/20/2020 10:58   DG ESOPHAGUS W  SINGLE CM (SOL OR THIN BA)  Result Date: 01/01/2021 CLINICAL DATA:  Dysphagia for several months EXAM: ESOPHOGRAM / BARIUM SWALLOW / BARIUM TABLET STUDY TECHNIQUE: Combined double contrast and single contrast examination performed using effervescent crystals, thick barium liquid, and thin barium liquid. The patient was observed with fluoroscopy swallowing a 13 mm barium sulphate tablet. FLUOROSCOPY TIME:  Fluoroscopy Time:  0.1 minute Radiation Exposure Index (if provided by the fluoroscopic device): 6.8 mGy Number of Acquired Spot Images: 0 COMPARISON:  None. FINDINGS: Normal pharyngeal anatomy and motility. Contrast flowed freely through the esophagus without evidence of a stricture or mass. Normal esophageal mucosa without evidence of irregularity or ulceration. Tertiary contractions of the distal half of the esophagus intermittently as can be seen with esophageal  spasm. Mild gastroesophageal reflux. No definite hiatal hernia was demonstrated. At the end of the examination a 13 mm barium tablet was administered which transited through the esophagus and esophagogastric junction without delay. Prior anterior cervical fusion at C4-5. IMPRESSION: Tertiary contractions of the distal half of the esophagus intermittently as can be seen with esophageal spasm. Mild gastroesophageal reflux. Electronically Signed   By: Elige Ko   On: 01/01/2021 11:17    Assessment and Plan:   JERZI TIGERT is a 65 y.o. y/o [name]  here today to see me as a follow-up for dysphagia.  EGD performed recently showed esophageal candidiasis.  Treated with Diflucan for 2 weeks with improvement of symptoms and subsequently recurrence, I then treated her for 2 more weeks and repeated an EGD which showed no candidiasis.  Subsequently performed a barium swallow with a tablet showed tertiary contractions and spasms.  Discussed about esophageal manometry and pH testing which I will have her schedule at [facility].  Based on results we will discuss next steps      I discussed the assessment and treatment plan with the patient. The patient was provided an opportunity to ask questions and all were answered. The patient agreed with the plan and demonstrated an understanding of the instructions.   The patient was advised to call back or seek an in-person evaluation if the symptoms worsen or if the condition fails to improve as anticipated.  I provided 18  minutes of face-to-face time during this encounter.  Dr Wyline Mood MD,MRCP [facility]) Gastroenterology/Hepatology Pager: [phone]   Speech recognition software was used to dictate this note.
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
## 2021-01-17 NOTE — Patient Instructions (Addendum)
[facility] pain clinic Dr. Cherylann Ratel will refer Continue to use salonpas   Please consider a donut pillow or coccyx cushion from [employer] or [employer] or [employer]  Low Back Sprain or Strain Rehab Ask your health care provider which exercises are safe for you. Do exercises exactly as told by your health care provider and adjust them as directed. It is normal to feel mild stretching, pulling, tightness, or discomfort as you do these exercises. Stop right away if you feel sudden pain or your pain gets worse. Do not begin these exercises until told by your health care provider. Stretching and range-of-motion exercises These exercises warm up your muscles and joints and improve the movement and flexibility of your back. These exercises also help to relieve pain, numbness, and tingling. Lumbar rotation 1. Lie on your back on a firm surface and bend your knees. 2. Straighten your arms out to your sides so each arm forms a 90-degree angle (right angle) with a side of your body. 3. Slowly move (rotate) both of your knees to one side of your body until you feel a stretch in your lower back (lumbar). Try not to let your shoulders lift off the floor. 4. Hold this position for __________ seconds. 5. Tense your abdominal muscles and slowly move your knees back to the starting position. 6. Repeat this exercise on the other side of your body. Repeat __________ times. Complete this exercise __________ times a day.   Single knee to chest 1. Lie on your back on a firm surface with both legs straight. 2. Bend one of your knees. Use your hands to move your knee up toward your chest until you feel a gentle stretch in your lower back and buttock. ? Hold your leg in this position by holding on to the front of your knee. ? Keep your other leg as straight as possible. 3. Hold this position for __________ seconds. 4. Slowly return to the starting position. 5. Repeat with your other leg. Repeat __________ times. Complete this  exercise __________ times a day.   Prone extension on elbows 1. Lie on your abdomen on a firm surface (prone position). 2. Prop yourself up on your elbows. 3. Use your arms to help lift your chest up until you feel a gentle stretch in your abdomen and your lower back. ? This will place some of your body weight on your elbows. If this is uncomfortable, try stacking pillows under your chest. ? Your hips should stay down, against the surface that you are lying on. Keep your hip and back muscles relaxed. 4. Hold this position for __________ seconds. 5. Slowly relax your upper body and return to the starting position. Repeat __________ times. Complete this exercise __________ times a day.   Strengthening exercises These exercises build strength and endurance in your back. Endurance is the ability to use your muscles for a long time, even after they get tired. Pelvic tilt This exercise strengthens the muscles that lie deep in the abdomen. 1. Lie on your back on a firm surface. Bend your knees and keep your feet flat on the floor. 2. Tense your abdominal muscles. Tip your pelvis up toward the ceiling and flatten your lower back into the floor. ? To help with this exercise, you may place a small towel under your lower back and try to push your back into the towel. 3. Hold this position for __________ seconds. 4. Let your muscles relax completely before you repeat this exercise. Repeat __________ times. Complete  this exercise __________ times a day. Alternating arm and leg raises 1. Get on your hands and knees on a firm surface. If you are on a hard floor, you may want to use padding, such as an exercise mat, to cushion your knees. 2. Line up your arms and legs. Your hands should be directly below your shoulders, and your knees should be directly below your hips. 3. Lift your left leg behind you. At the same time, raise your right arm and straighten it in front of you. ? Do not lift your leg higher  than your hip. ? Do not lift your arm higher than your shoulder. ? Keep your abdominal and back muscles tight. ? Keep your hips facing the ground. ? Do not arch your back. ? Keep your balance carefully, and do not hold your breath. 4. Hold this position for __________ seconds. 5. Slowly return to the starting position. 6. Repeat with your right leg and your left arm. Repeat __________ times. Complete this exercise __________ times a day.   Abdominal set with straight leg raise 1. Lie on your back on a firm surface. 2. Bend one of your knees and keep your other leg straight. 3. Tense your abdominal muscles and lift your straight leg up, 4-6 inches (10-15 cm) off the ground. 4. Keep your abdominal muscles tight and hold this position for __________ seconds. ? Do not hold your breath. ? Do not arch your back. Keep it flat against the ground. 5. Keep your abdominal muscles tense as you slowly lower your leg back to the starting position. 6. Repeat with your other leg. Repeat __________ times. Complete this exercise __________ times a day.   Single leg lower with bent knees 1. Lie on your back on a firm surface. 2. Tense your abdominal muscles and lift your feet off the floor, one foot at a time, so your knees and hips are bent in 90-degree angles (right angles). ? Your knees should be over your hips and your lower legs should be parallel to the floor. 3. Keeping your abdominal muscles tense and your knee bent, slowly lower one of your legs so your toe touches the ground. 4. Lift your leg back up to return to the starting position. ? Do not hold your breath. ? Do not let your back arch. Keep your back flat against the ground. 5. Repeat with your other leg. Repeat __________ times. Complete this exercise __________ times a day. Posture and body mechanics Good posture and healthy body mechanics can help to relieve stress in your body's tissues and joints. Body mechanics refers to the  movements and positions of your body while you do your daily activities. Posture is part of body mechanics. Good posture means:  Your spine is in its natural S-curve position (neutral).  Your shoulders are pulled back slightly.  Your head is not tipped forward. Follow these guidelines to improve your posture and body mechanics in your everyday activities. Standing  When standing, keep your spine neutral and your feet about hip width apart. Keep a slight bend in your knees. Your ears, shoulders, and hips should line up.  When you do a task in which you stand in one place for a long time, place one foot up on a stable object that is 2-4 inches (5-10 cm) high, such as a footstool. This helps keep your spine neutral.   Sitting  When sitting, keep your spine neutral and keep your feet flat on the floor. Use a  footrest, if necessary, and keep your thighs parallel to the floor. Avoid rounding your shoulders, and avoid tilting your head forward.  When working at a desk or a computer, keep your desk at a height where your hands are slightly lower than your elbows. Slide your chair under your desk so you are close enough to maintain good posture.  When working at a computer, place your monitor at a height where you are looking straight ahead and you do not have to tilt your head forward or downward to look at the screen.   Resting  When lying down and resting, avoid positions that are most painful for you.  If you have pain with activities such as sitting, bending, stooping, or squatting, lie in a position in which your body does not bend very much. For example, avoid curling up on your side with your arms and knees near your chest (fetal position).  If you have pain with activities such as standing for a long time or reaching with your arms, lie with your spine in a neutral position and bend your knees slightly. Try the following positions: ? Lying on your side with a pillow between your  knees. ? Lying on your back with a pillow under your knees. Lifting  When lifting objects, keep your feet at least shoulder width apart and tighten your abdominal muscles.  Bend your knees and hips and keep your spine neutral. It is important to lift using the strength of your legs, not your back. Do not lock your knees straight out.  Always ask for help to lift heavy or awkward objects.   This information is not intended to replace advice given to you by your health care provider. Make sure you discuss any questions you have with your health care provider. Document Revised: 12/31/2018 Document Reviewed: 09/30/2018 Else[employer] Patient Education  2021 [employer]  Back Exercises The following exercises strengthen the muscles that help to support the trunk and back. They also help to keep the lower back flexible. Doing these exercises can help to prevent back pain or lessen existing pain.  If you have back pain or discomfort, try doing these exercises 2-3 times each day or as told by your health care provider.  As your pain improves, do them once each day, but increase the number of times that you repeat the steps for each exercise (do more repetitions).  To prevent the recurrence of back pain, continue to do these exercises once each day or as told by your health care provider. Do exercises exactly as told by your health care provider and adjust them as directed. It is normal to feel mild stretching, pulling, tightness, or discomfort as you do these exercises, but you should stop right away if you feel sudden pain or your pain gets worse. Exercises Single knee to chest Repeat these steps 3-5 times for each leg: 1. Lie on your back on a firm bed or the floor with your legs extended. 2. Bring one knee to your chest. Your other leg should stay extended and in contact with the floor. 3. Hold your knee in place by grabbing your knee or thigh with both hands and hold. 4. Pull on your knee until  you feel a gentle stretch in your lower back or buttocks. 5. Hold the stretch for 10-30 seconds. 6. Slowly release and straighten your leg. Pelvic tilt Repeat these steps 5-10 times: 1. Lie on your back on a firm bed or the floor with your  legs extended. 2. Bend your knees so they are pointing toward the ceiling and your feet are flat on the floor. 3. Tighten your lower abdominal muscles to press your lower back against the floor. This motion will tilt your pelvis so your tailbone points up toward the ceiling instead of pointing to your feet or the floor. 4. With gentle tension and even breathing, hold this position for 5-10 seconds. Cat-cow Repeat these steps until your lower back becomes more flexible: 1. Get into a hands-and-knees position on a firm surface. Keep your hands under your shoulders, and keep your knees under your hips. You may place padding under your knees for comfort. 2. Let your head hang down toward your chest. Contract your abdominal muscles and point your tailbone toward the floor so your lower back becomes rounded like the back of a cat. 3. Hold this position for 5 seconds. 4. Slowly lift your head, let your abdominal muscles relax and point your tailbone up toward the ceiling so your back forms a sagging arch like the back of a cow. 5. Hold this position for 5 seconds.   Press-ups Repeat these steps 5-10 times: 1. Lie on your abdomen (face-down) on the floor. 2. Place your palms near your head, about shoulder-width apart. 3. Keeping your back as relaxed as possible and keeping your hips on the floor, slowly straighten your arms to raise the top half of your body and lift your shoulders. Do not use your back muscles to raise your upper torso. You may adjust the placement of your hands to make yourself more comfortable. 4. Hold this position for 5 seconds while you keep your back relaxed. 5. Slowly return to lying flat on the floor.   Bridges Repeat these steps 10  times: 1. Lie on your back on a firm surface. 2. Bend your knees so they are pointing toward the ceiling and your feet are flat on the floor. Your arms should be flat at your sides, next to your body. 3. Tighten your buttocks muscles and lift your buttocks off the floor until your waist is at almost the same height as your knees. You should feel the muscles working in your buttocks and the back of your thighs. If you do not feel these muscles, slide your feet 1-2 inches farther away from your buttocks. 4. Hold this position for 3-5 seconds. 5. Slowly lower your hips to the starting position, and allow your buttocks muscles to relax completely. If this exercise is too easy, try doing it with your arms crossed over your chest.   Abdominal crunches Repeat these steps 5-10 times: 1. Lie on your back on a firm bed or the floor with your legs extended. 2. Bend your knees so they are pointing toward the ceiling and your feet are flat on the floor. 3. Cross your arms over your chest. 4. Tip your chin slightly toward your chest without bending your neck. 5. Tighten your abdominal muscles and slowly raise your trunk (torso) high enough to lift your shoulder blades a tiny bit off the floor. Avoid raising your torso higher than that because it can put too much stress on your low back and does not help to strengthen your abdominal muscles. 6. Slowly return to your starting position. Back lifts Repeat these steps 5-10 times: 1. Lie on your abdomen (face-down) with your arms at your sides, and rest your forehead on the floor. 2. Tighten the muscles in your legs and your buttocks. 3. Slowly lift  your chest off the floor while you keep your hips pressed to the floor. Keep the back of your head in line with the curve in your back. Your eyes should be looking at the floor. 4. Hold this position for 3-5 seconds. 5. Slowly return to your starting position. Contact a health care provider if:  Your back pain or  discomfort gets much worse when you do an exercise.  Your worsening back pain or discomfort does not lessen within 2 hours after you exercise. If you have any of these problems, stop doing these exercises right away. Do not do them again unless your health care provider says that you can. Get help right away if:  You develop sudden, severe back pain. If this happens, stop doing the exercises right away. Do not do them again unless your health care provider says that you can. This information is not intended to replace advice given to you by your health care provider. Make sure you discuss any questions you have with your health care provider. Document Revised: 01/13/2019 Document Reviewed: 06/10/2018 Elsevier Patient Education  2021 [employer]   Esophageal Spasm  An esophageal spasm is a sudden tightening (contraction) of the esophagus, which is the part of the body that moves food from the mouth to the stomach. Normally, smooth, wave-like muscle contractions move food and liquids down the esophagus. Esophageal spasms are abnormal muscle contractions that can cause chest pain and trouble swallowing (dysphagia). Spasms may also cause swallowed foods or liquids to come back up into the throat (regurgitation). There are two types of esophageal spasms. You may have one or both types:  Diffuse esophageal spasms. These are irregular, uncoordinated spasms. This type tends to cause more dysphagia.  Nutcracker esophagus. This is a type of spasm in which the muscles move normally, but the contraction is very strong. This type tends to be more painful. Severe esophageal spasms can make it hard to eat and do everyday activities. They often occur with severe heartburn (reflux esophagitis). The symptoms can come and go and may be triggered or worsened depending on your diet or other medical issues. What are the causes? The cause of esophageal spasms is not known. What increases the risk? The following  factors may make you more likely to develop esophageal spasms:  Being female.  Age. The risk may increase as you get older.  Depression or anxiety.  Having gastroesophageal reflux disease (GERD). What are the signs or symptoms? Symptoms may vary from day to day. They may be mild or severe. They may last for minutes or hours. Common symptoms include:  Chest pain. This may feel like a heart attack.  Back pain.  Dysphagia.  Heartburn.  A feeling that something is stuck in the throat (globus).  Regurgitation of foods or liquids. For some people, certain things may trigger symptoms, such as:  Certain foods and drinks. These may include very hot or very cold foods or drinks.  Eating very quickly. How is this diagnosed? This condition may be diagnosed based on your symptoms and a physical exam. You may have tests, such as:  Endoscopy. This involves using a flexible tube that has a camera on the end of it (endoscope) to look down your throat and examine your esophagus.  Barium swallow. For this test, you drink a substance that will show up well on X-rays (barium) and then have X-rays to see how the substance moves through your esophagus.  Esophageal manometry. This involves passing a small, thin  tube through your nose and down into your throat. The tube contains pressure sensors that measure muscle contractions in the esophagus while you swallow. How is this treated? Mild esophageal spasms may not need treatment. You may be able to manage the spasms by avoiding triggers. For more frequent or severe spasms, treatment may include:  Medicine to: ? Relax the esophageal muscles. ? Relieve muscle spasms (calcium channel blockers and nitrates). ? Relieve pain by blocking nerve endings in the esophagus. This is done with an injection of a toxin (botulinum). ? Relieve heartburn (proton pump inhibitors).  Antidepressant medicines. These are sometimes used to ease symptoms.  Surgery to  reduce esophageal muscle contractions (myotomy). This may be needed for very severe cases. Follow these instructions at home: Eating and drinking  Keep track of foods, drinks, and habits that trigger spasms or heartburn. Avoid these triggers as much as you can.  Eat meals slowly. Chew food completely before swallowing.  Avoid swallowing foods and drinks when they are very hot or very cold. General instructions  Take over-the-counter and prescription medicines only as told by your health care provider.  Find ways to manage stress, such as regular exercise or meditation.  If you struggle with depression or anxiety, talk with your health care provider about treatment options.  Keep all follow-up visits. This is important. Contact a health care provider if:  Your symptoms get worse or do not get better with medicine.  You are losing weight because of dysphagia.  Your esophageal spasms affect your quality of life, such as your ability to eat. Get help right away if:  You have severe chest pain.  You have chest pain that is different from your usual chest pain.  You have trouble breathing.  You choke. These symptoms may represent a serious problem that is an emergency. Do not wait to see if the symptoms will go away. Get medical help right away. Call your local emergency services (911 in the [place] Do not drive yourself to the hospital. Summary  An esophageal spasm is a sudden tightening (contraction) of the esophagus, which is the part of the body that moves food from the mouth to the stomach. These abnormal muscle contractions can cause chest pain and trouble swallowing (dysphagia).  The cause of esophageal spasms is not known.  Treatment may not be needed for mild spasms. For frequent or more severe spasms, treatment may include medicine, or, for very severe spasms, surgery.  Keep track of foods, drinks, and habits that trigger spasms or heartburn. Avoid these triggers as much  as you can. This information is not intended to replace advice given to you by your health care provider. Make sure you discuss any questions you have with your health care provider. Document Revised: 04/28/2020 Document Reviewed: 04/28/2020 Elsevier Patient Education  2021 [employer]  How to Take Your Blood Pressure Blood pressure is a measurement of how strongly your blood is pressing against the walls of your arteries. Arteries are blood vessels that carry blood from your heart throughout your body. Your health care provider takes your blood pressure at each office visit. You can also take your own blood pressure at home with a blood pressure monitor. You may need to take your own blood pressure to:  Confirm a diagnosis of high blood pressure (hypertension).  Monitor your blood pressure over time.  Make sure your blood pressure medicine is working. Supplies needed:  Blood pressure monitor.  Dining room chair to sit in.  Table  or desk.  Small notebook and pencil or pen. How to prepare To get the most accurate reading, avoid the following for 30 minutes before you check your blood pressure:  Drinking caffeine.  Drinking alcohol.  Eating.  Smoking.  Exercising. Five minutes before you check your blood pressure:  Use the bathroom and urinate so that you have an empty bladder.  Sit quietly in a dining room chair. Do not sit in a soft couch or an armchair. Do not talk. How to take your blood pressure To check your blood pressure, follow the instructions in the manual that came with your blood pressure monitor. If you have a digital blood pressure monitor, the instructions may be as follows: 4. Sit up straight in a chair. 5. Place your feet on the floor. Do not cross your ankles or legs. 6. Rest your left arm at the level of your heart on a table or desk or on the arm of a chair. 7. Pull up your shirt sleeve. 8. Wrap the blood pressure cuff around the upper part of your  left arm, 1 inch (2.5 cm) above your elbow. It is best to wrap the cuff around bare skin. 9. Fit the cuff snugly around your arm. You should be able to place only one finger between the cuff and your arm. 10. Position the cord so that it rests in the bend of your elbow. 11. Press the power button. 12. Sit quietly while the cuff inflates and deflates. 13. Read the digital reading on the monitor screen and write the numbers down (record them) in a notebook. 14. Wait 2-3 minutes, then repeat the steps, starting at step 1.   What does my blood pressure reading mean? A blood pressure reading consists of a higher number over a lower number. Ideally, your blood pressure should be below 120/80. The first ("top") number is called the systolic pressure. It is a measure of the pressure in your arteries as your heart beats. The second ("bottom") number is called the diastolic pressure. It is a measure of the pressure in your arteries as the heart relaxes. Blood pressure is classified into five stages. The following are the stages for adults who do not have a short-term serious illness or a chronic condition. Systolic pressure and diastolic pressure are measured in a unit called mm Hg (millimeters of mercury).  Normal  Systolic pressure: below 120.  Diastolic pressure: below 80. Elevated  Systolic pressure: 120-129.  Diastolic pressure: below 80. Hypertension stage 1  Systolic pressure: 130-139.  Diastolic pressure: 80-89. Hypertension stage 2  Systolic pressure: 140 or above.  Diastolic pressure: 90 or above. You can have elevated blood pressure or hypertension even if only the systolic or only the diastolic number in your reading is higher than normal. Follow these instructions at home:  Check your blood pressure as often as recommended by your health care provider.  Check your blood pressure at the same time every day.  Take your monitor to the next appointment with your health care  provider to make sure that: ? You are using it correctly. ? It provides accurate readings.  Be sure you understand what your goal blood pressure numbers are.  Tell your health care provider if you are having any side effects from blood pressure medicine.  Keep all follow-up visits as told by your health care provider. This is important. General tips  Your health care provider can suggest a reliable monitor that will meet your needs. There are several  types of home blood pressure monitors.  Choose a monitor that has an arm cuff. Do not choose a monitor that measures your blood pressure from your wrist or finger.  Choose a cuff that wraps snugly around your upper arm. You should be able to fit only one finger between your arm and the cuff.  You can buy a blood pressure monitor at most drugstores or online. Where to find more information [employer] Association: www.heart.org Contact a health care provider if:  Your blood pressure is consistently high. Get help right away if:  Your systolic blood pressure is higher than 180.  Your diastolic blood pressure is higher than 120. Summary  Blood pressure is a measurement of how strongly your blood is pressing against the walls of your arteries.  A blood pressure reading consists of a higher number over a lower number. Ideally, your blood pressure should be below 120/80.  Check your blood pressure at the same time every day.  Avoid caffeine, alcohol, smoking, and exercise for 30 minutes prior to checking your blood pressure. These agents can affect the accuracy of the blood pressure reading. This information is not intended to replace advice given to you by your health care provider. Make sure you discuss any questions you have with your health care provider. Document Revised: 09/02/2019 Document Reviewed: 09/02/2019 Elsevier Patient Education  2021 Elsevier Inc.  DASH Eating Plan DASH stands for Dietary Approaches to Stop  Hypertension. The DASH eating plan is a healthy eating plan that has been shown to:  Reduce high blood pressure (hypertension).  Reduce your risk for type 2 diabetes, heart disease, and stroke.  Help with weight loss. What are tips for following this plan? Reading food labels  Check food labels for the amount of salt (sodium) per serving. Choose foods with less than 5 percent of the Daily Value of sodium. Generally, foods with less than 300 milligrams (mg) of sodium per serving fit into this eating plan.  To find whole grains, look for the word "whole" as the first word in the ingredient list. Shopping  Buy products labeled as "low-sodium" or "no salt added."  Buy fresh foods. Avoid canned foods and pre-made or frozen meals. Cooking  Avoid adding salt when cooking. Use salt-free seasonings or herbs instead of table salt or sea salt. Check with your health care provider or pharmacist before using salt substitutes.  Do not fry foods. Cook foods using healthy methods such as baking, boiling, grilling, roasting, and broiling instead.  Cook with heart-healthy oils, such as olive, canola, avocado, soybean, or sunflower oil. Meal planning  Eat a balanced diet that includes: ? 4 or more servings of fruits and 4 or more servings of vegetables each day. Try to fill one-half of your plate with fruits and vegetables. ? 6-8 servings of whole grains each day. ? Less than 6 oz (170 g) of lean meat, poultry, or fish each day. A 3-oz (85-g) serving of meat is about the same size as a deck of cards. One egg equals 1 oz (28 g). ? 2-3 servings of low-fat dairy each day. One serving is 1 cup (237 mL). ? 1 serving of nuts, seeds, or beans 5 times each week. ? 2-3 servings of heart-healthy fats. Healthy fats called omega-3 fatty acids are found in foods such as walnuts, flaxseeds, fortified milks, and eggs. These fats are also found in cold-water fish, such as sardines, salmon, and mackerel.  Limit how  much you eat of: ? Canned  or prepackaged foods. ? Food that is high in trans fat, such as some fried foods. ? Food that is high in saturated fat, such as fatty meat. ? Desserts and other sweets, sugary drinks, and other foods with added sugar. ? Full-fat dairy products.  Do not salt foods before eating.  Do not eat more than 4 egg yolks a week.  Try to eat at least 2 vegetarian meals a week.  Eat more home-cooked food and less restaurant, buffet, and fast food.   Lifestyle  When eating at a restaurant, ask that your food be prepared with less salt or no salt, if possible.  If you drink alcohol: ? Limit how much you use to:  0-1 drink a day for women who are not pregnant.  0-2 drinks a day for men. ? Be aware of how much alcohol is in your drink. In the [place] one drink equals one 12 oz bottle of beer (355 mL), one 5 oz glass of wine (148 mL), or one 1 oz glass of hard liquor (44 mL). General information  Avoid eating more than 2,300 mg of salt a day. If you have hypertension, you may need to reduce your sodium intake to 1,500 mg a day.  Work with your health care provider to maintain a healthy body weight or to lose weight. Ask what an ideal weight is for you.  Get at least 30 minutes of exercise that causes your heart to beat faster (aerobic exercise) most days of the week. Activities may include walking, swimming, or biking.  Work with your health care provider or dietitian to adjust your eating plan to your individual calorie needs. What foods should I eat? Fruits All fresh, dried, or frozen fruit. Canned fruit in natural juice (without added sugar). Vegetables Fresh or frozen vegetables (raw, steamed, roasted, or grilled). Low-sodium or reduced-sodium tomato and vegetable juice. Low-sodium or reduced-sodium tomato sauce and tomato paste. Low-sodium or reduced-sodium canned vegetables. Grains Whole-grain or whole-wheat bread. Whole-grain or whole-wheat pasta. Brown rice.  Orpah Cobb. Bulgur. Whole-grain and low-sodium cereals. Pita bread. Low-fat, low-sodium crackers. Whole-wheat flour tortillas. Meats and other proteins Skinless chicken or [place]. Ground chicken or [place]. Pork with fat trimmed off. Fish and seafood. Egg whites. Dried beans, peas, or lentils. Unsalted nuts, nut butters, and seeds. Unsalted canned beans. Lean cuts of beef with fat trimmed off. Low-sodium, lean precooked or cured meat, such as sausages or meat loaves. Dairy Low-fat (1%) or fat-free (skim) milk. Reduced-fat, low-fat, or fat-free cheeses. Nonfat, low-sodium ricotta or cottage cheese. Low-fat or nonfat yogurt. Low-fat, low-sodium cheese. Fats and oils Soft margarine without trans fats. Vegetable oil. Reduced-fat, low-fat, or light mayonnaise and salad dressings (reduced-sodium). Canola, safflower, olive, avocado, soybean, and sunflower oils. Avocado. Seasonings and condiments Herbs. Spices. Seasoning mixes without salt. Other foods Unsalted popcorn and pretzels. Fat-free sweets. The items listed above may not be a complete list of foods and beverages you can eat. Contact a dietitian for more information. What foods should I avoid? Fruits Canned fruit in a light or heavy syrup. Fried fruit. Fruit in cream or butter sauce. Vegetables Creamed or fried vegetables. Vegetables in a cheese sauce. Regular canned vegetables (not low-sodium or reduced-sodium). Regular canned tomato sauce and paste (not low-sodium or reduced-sodium). Regular tomato and vegetable juice (not low-sodium or reduced-sodium). Rosita Fire. Olives. Grains Baked goods made with fat, such as croissants, muffins, or some breads. Dry pasta or rice meal packs. Meats and other proteins Fatty cuts of meat. Ribs. Foy [place]  meat. Tomasa Blase. Bologna, salami, and other precooked or cured meats, such as sausages or meat loaves. Fat from the back of a pig (fatback). Bratwurst. Salted nuts and seeds. Canned beans with added salt. Canned or  smoked fish. Whole eggs or egg yolks. Chicken or [place] with skin. Dairy Whole or 2% milk, cream, and half-and-half. Whole or full-fat cream cheese. Whole-fat or sweetened yogurt. Full-fat cheese. Nondairy creamers. Whipped toppings. Processed cheese and cheese spreads. Fats and oils Butter. Stick margarine. Lard. Shortening. Ghee. Bacon fat. Tropical oils, such as coconut, palm kernel, or palm oil. Seasonings and condiments Onion salt, garlic salt, seasoned salt, table salt, and sea salt. Worcestershire sauce. Tartar sauce. Barbecue sauce. Teriyaki sauce. Soy sauce, including reduced-sodium. Steak sauce. Canned and packaged gravies. Fish sauce. Oyster sauce. Cocktail sauce. Store-bought horseradish. Ketchup. Mustard. Meat flavorings and tenderizers. Bouillon cubes. Hot sauces. Pre-made or packaged marinades. Pre-made or packaged taco seasonings. Relishes. Regular salad dressings. Other foods Salted popcorn and pretzels. The items listed above may not be a complete list of foods and beverages you should avoid. Contact a dietitian for more information. Where to find more information  National Heart, Lung, and Blood Institute: PopSteam.is  American Heart Association: www.heart.org  Academy of Nutrition and Dietetics: www.eatright.org  National Kidney Foundation: www.kidney.org Summary  The DASH eating plan is a healthy eating plan that has been shown to reduce high blood pressure (hypertension). It may also reduce your risk for type 2 diabetes, heart disease, and stroke.  When on the DASH eating plan, aim to eat more fresh fruits and vegetables, whole grains, lean proteins, low-fat dairy, and heart-healthy fats.  With the DASH eating plan, you should limit salt (sodium) intake to 2,300 mg a day. If you have hypertension, you may need to reduce your sodium intake to 1,500 mg a day.  Work with your health care provider or dietitian to adjust your eating plan to your individual calorie  needs. This information is not intended to replace advice given to you by your health care provider. Make sure you discuss any questions you have with your health care provider. Document Revised: 08/12/2019 Document Reviewed: 08/12/2019 Elsevier Patient Education  2021 [employer].

## 2021-01-18 ENCOUNTER — Telehealth: Payer: Self-pay | Admitting: Internal Medicine

## 2021-01-18 NOTE — Telephone Encounter (Signed)
Prior authorization has been submitted for patient's Flexeril.   Awaiting approval or denial.   

## 2021-01-21 ENCOUNTER — Encounter: Payer: Self-pay | Admitting: Internal Medicine

## 2021-01-28 ENCOUNTER — Other Ambulatory Visit: Payer: Self-pay

## 2021-01-28 ENCOUNTER — Ambulatory Visit
Admission: RE | Admit: 2021-01-28 | Discharge: 2021-01-28 | Disposition: A | Discharge status: Home / Self Care | Payer: Medicare Other | Source: Ambulatory Visit

## 2021-01-28 ENCOUNTER — Ambulatory Visit
Admission: RE | Admit: 2021-01-28 | Discharge: 2021-01-28 | Disposition: A | Discharge status: Home / Self Care | Payer: Medicare Other | Source: Ambulatory Visit | Attending: Internal Medicine | Admitting: Internal Medicine

## 2021-01-28 DIAGNOSIS — Z1231 Encounter for screening mammogram for malignant neoplasm of breast: Secondary | ICD-10-CM | POA: Diagnosis not present

## 2021-01-28 DIAGNOSIS — E2839 Other primary ovarian failure: Secondary | ICD-10-CM

## 2021-01-28 DIAGNOSIS — M810 Age-related osteoporosis without current pathological fracture: Secondary | ICD-10-CM

## 2021-01-28 DIAGNOSIS — Z780 Asymptomatic menopausal state: Secondary | ICD-10-CM | POA: Diagnosis not present

## 2021-02-05 ENCOUNTER — Telehealth: Payer: Self-pay | Admitting: Gastroenterology

## 2021-02-05 DIAGNOSIS — B3781 Candidal esophagitis: Secondary | ICD-10-CM

## 2021-02-05 NOTE — Telephone Encounter (Signed)
Patient lvm she needs to talk to Dr. Tobi Bastos or a nurse.

## 2021-02-05 NOTE — Telephone Encounter (Signed)
Returned patient call. Will send referral over to [facility].

## 2021-02-07 ENCOUNTER — Telehealth: Payer: Self-pay | Admitting: Gastroenterology

## 2021-02-08 ENCOUNTER — Telehealth: Payer: Self-pay

## 2021-02-08 NOTE — Telephone Encounter (Signed)
Was informed by [facility] front desk staff the referral has been looked at and they will be reaching out to the patient to schedule procedure. Called informed patient someone would be contacting her to schedule this appointment. Pt verbalized understanding.

## 2021-02-11 NOTE — Telephone Encounter (Signed)
The referral has been reviewed by our RN's. It is for an esophageal mano. I read the GI note. It says esophageal mano and Ph study. Please review it and advise on testing to be scheduled. Thanks

## 2021-02-12 ENCOUNTER — Other Ambulatory Visit: Payer: Self-pay

## 2021-02-12 DIAGNOSIS — R1310 Dysphagia, unspecified: Secondary | ICD-10-CM

## 2021-02-12 DIAGNOSIS — K219 Gastro-esophageal reflux disease without esophagitis: Secondary | ICD-10-CM

## 2021-02-12 NOTE — Telephone Encounter (Signed)
Spoke with the patient. Esophageal mano with 24 hr Ph Impedance on 03/04/21 at 10:30 am. Advised she will be returning to the endo dept the following day. Instructions mailed.

## 2021-02-12 NOTE — Telephone Encounter (Signed)
Please schedule esophageal manometry and 24 hr pH impedance on PPI. Thanks

## 2021-02-13 ENCOUNTER — Ambulatory Visit: Payer: Medicare Other

## 2021-02-13 ENCOUNTER — Telehealth: Payer: Self-pay

## 2021-02-13 NOTE — Telephone Encounter (Signed)
Spoke with patient and she informed me [facility] has scheduled her procedure for June.

## 2021-02-20 ENCOUNTER — Telehealth: Payer: Self-pay | Admitting: Internal Medicine

## 2021-02-20 NOTE — Telephone Encounter (Signed)
Noted  

## 2021-02-20 NOTE — Telephone Encounter (Signed)
PT called in to inform that she hit her knee and since then it has been hurting she doesn't know if its sprained or bruise. Put her on the schedule for the appt tomorrow.

## 2021-02-21 ENCOUNTER — Telehealth: Payer: Self-pay

## 2021-02-21 ENCOUNTER — Ambulatory Visit: Payer: Medicare Other | Admitting: Internal Medicine

## 2021-02-21 NOTE — Telephone Encounter (Signed)
Patient called at 8:12am on 02/21/21 to cancel appointment, she did not want to reschedule appt

## 2021-02-21 NOTE — Telephone Encounter (Signed)
Noted  

## 2021-02-21 NOTE — Telephone Encounter (Signed)
Patient called the office at 7:31 am to cancel an appointment.

## 2021-02-27 ENCOUNTER — Telehealth: Payer: Self-pay | Admitting: Internal Medicine

## 2021-02-27 DIAGNOSIS — E039 Hypothyroidism, unspecified: Secondary | ICD-10-CM

## 2021-02-27 MED ORDER — LEVOTHYROXINE SODIUM 175 MCG PO TABS: 175.0000 ug | ORAL_TABLET | Freq: Every day | ORAL | 3 refills | Status: DC

## 2021-02-27 NOTE — Addendum Note (Signed)
Addended by: Elise Benne T on: 02/27/2021 01:56 PM   Modules accepted: Orders

## 2021-02-27 NOTE — Telephone Encounter (Signed)
PT called to request a refill of their levothyroxine (SYNTHROID) 175 MCG tablet to be called in. She is currently out of her meds atm.

## 2021-02-28 ENCOUNTER — Telehealth: Payer: Self-pay

## 2021-02-28 NOTE — Telephone Encounter (Signed)
LMTCB regarding thyroid medication

## 2021-03-04 ENCOUNTER — Ambulatory Visit: Payer: Medicare Other

## 2021-03-04 ENCOUNTER — Encounter (HOSPITAL_COMMUNITY)
Admission: RE | Disposition: A | Discharge status: Home / Self Care | Payer: Self-pay | Source: Home / Self Care | Attending: Gastroenterology

## 2021-03-04 ENCOUNTER — Ambulatory Visit (HOSPITAL_COMMUNITY)
Admission: RE | Admit: 2021-03-04 | Discharge: 2021-03-04 | Disposition: A | Discharge status: Home / Self Care | Payer: Medicare Other | Attending: Gastroenterology | Admitting: Gastroenterology

## 2021-03-04 DIAGNOSIS — K219 Gastro-esophageal reflux disease without esophagitis: Secondary | ICD-10-CM | POA: Diagnosis not present

## 2021-03-04 DIAGNOSIS — R1310 Dysphagia, unspecified: Secondary | ICD-10-CM | POA: Insufficient documentation

## 2021-03-04 HISTORY — PX: ESOPHAGEAL MANOMETRY: SHX5429

## 2021-03-04 HISTORY — PX: PH IMPEDANCE STUDY: SHX5565

## 2021-03-04 HISTORY — PX: 24 HOUR PH STUDY: SHX5419

## 2021-03-04 SURGERY — MANOMETRY, ESOPHAGUS
Anesthesia: Choice

## 2021-03-04 MED ORDER — LEVOTHYROXINE SODIUM 175 MCG PO TABS: 175.0000 ug | ORAL_TABLET | Freq: Every day | ORAL | 3 refills | Status: DC

## 2021-03-04 MED ORDER — LIDOCAINE VISCOUS HCL 2 % MT SOLN
OROMUCOSAL | Status: AC
  Filled 2021-03-04: qty 15

## 2021-03-04 SURGICAL SUPPLY — 2 items
FACESHIELD LNG OPTICON STERILE (SAFETY) IMPLANT
GLOVE BIO SURGEON STRL SZ8 (GLOVE) ×4 IMPLANT

## 2021-03-04 NOTE — Telephone Encounter (Signed)
Patient called about her levothyroxine (SYNTHROID) 175 MCG tablet. She says her pharmacy doesn't know wihich  one , is it the 175 mcg.

## 2021-03-04 NOTE — Progress Notes (Signed)
Esophageal manometry performed per protocol without complications.  Patient tolerated well. pH probed placed at 37 cm per protocol without complications.  Patient tolerated well.  Education given on pH probe, pH study and pH diary.  Patient verbalized understanding to complete diary and return with diary tomorrow at 11:20 am to have pH probe removed.

## 2021-03-04 NOTE — Telephone Encounter (Signed)
PT called into the office today stating that this is ridiculous now that she has been waiting since June 8th and is out of her levothyroxine (synthroid) 175 mcg tablet. The Rx is telling her now that till the correct one is called in they will not fill her meds. PT would like a call once the correct one is called in so she does not waste time going back and forth.

## 2021-03-04 NOTE — Addendum Note (Signed)
Addended by: Elise Benne T on: 03/04/2021 10:42 AM   Modules accepted: Orders

## 2021-03-05 NOTE — Telephone Encounter (Signed)
Tried to call patient to inform that correct medication has been sent. And there should be no confusion of medication. Medication below is as followed. levothyroxine (SYNTHROID) 175 MCG tablet 175 mcg, Daily before breakfast 3 ordered        Summary: Take 1 tablet (175 mcg total) by mouth daily before breakfast. 30 min before food, Starting Mon 03/04/2021, Normal

## 2021-03-06 ENCOUNTER — Encounter (HOSPITAL_COMMUNITY): Payer: Self-pay | Admitting: Gastroenterology

## 2021-03-12 ENCOUNTER — Telehealth: Payer: Self-pay | Admitting: Gastroenterology

## 2021-03-12 ENCOUNTER — Ambulatory Visit (INDEPENDENT_AMBULATORY_CARE_PROVIDER_SITE_OTHER): Payer: Medicare Other | Admitting: Pharmacist

## 2021-03-12 DIAGNOSIS — G4700 Insomnia, unspecified: Secondary | ICD-10-CM

## 2021-03-12 DIAGNOSIS — W19XXXA Unspecified fall, initial encounter: Secondary | ICD-10-CM

## 2021-03-12 DIAGNOSIS — M810 Age-related osteoporosis without current pathological fracture: Secondary | ICD-10-CM

## 2021-03-12 DIAGNOSIS — I10 Essential (primary) hypertension: Secondary | ICD-10-CM

## 2021-03-12 DIAGNOSIS — F339 Major depressive disorder, recurrent, unspecified: Secondary | ICD-10-CM

## 2021-03-12 DIAGNOSIS — E039 Hypothyroidism, unspecified: Secondary | ICD-10-CM

## 2021-03-12 NOTE — Patient Instructions (Signed)
Visit Information  PATIENT GOALS:  Goals Addressed               This Visit's Progress     Patient Stated     Medication Monitoring (pt-stated)        Patient Goals/Self-Care Activities Over the next 90 days, patient will:  - take medications as prescribed collaborate with provider on medication access solutions          Patient verbalizes understanding of instructions provided today and agrees to view in MyChart.    Plan: Telephone follow up appointment with care management team member scheduled for:  ~ 4 days  [name], PharmD, [place], CPP Clinical Pharmacist [employer] [facility] [employer] [phone]

## 2021-03-12 NOTE — Telephone Encounter (Signed)
Calling for pathology results.

## 2021-03-12 NOTE — Chronic Care Management (AMB) (Signed)
Chronic Care Management Pharmacy Note  03/12/2021 Name:  [name] MRN:  [id number] DOB:  [birth year]  Subjective: [name] is an 65 y.o. year old female who is a primary patient of McLean-Scocuzza, Nino Glow, MD.  The CCM team was consulted for assistance with disease management and care coordination needs.    Engaged with patient by telephone for follow up visit in response to provider referral for pharmacy case management and/or care coordination services.   Consent to Services:  The patient was given information about Chronic Care Management services, agreed to services, and gave verbal consent prior to initiation of services.  Please see initial visit note for detailed documentation.   Patient Care Team: McLean-Scocuzza, Nino Glow, MD as PCP - General (Internal Medicine) De Hollingshead, RPH-CPP (Pharmacist)  Recent office visits: 4/28 - PCP s/p fall. Wanted ref to [facility] Dr. Holley Raring. Upcoming appt 7/19  Recent consult visits: 4/28 - [facility] - schedule esophageal manometry (completed 6/13)  Hospital visits: None in previous 6 months  Objective:  Lab Results  Component Value Date   CREATININE 0.71 08/24/2020   CREATININE 0.70 10/04/2019   CREATININE 0.60 07/15/2019    Lab Results  Component Value Date   HGBA1C 5.6 11/30/2019   Last diabetic Eye exam: No results found for: HMDIABEYEEXA  Last diabetic Foot exam: No results found for: HMDIABFOOTEX      Component Value Date/Time   CHOL 189 10/30/2020 1053   TRIG 173.0 (H) 10/30/2020 1053   HDL 63.70 10/30/2020 1053   CHOLHDL 3 10/30/2020 1053   VLDL 34.6 10/30/2020 1053   LDLCALC 90 10/30/2020 1053    Hepatic Function Latest Ref Rng & Units 10/30/2020 11/30/2019 07/15/2019  Total Protein 6.0 - 8.3 g/dL 7.0 7.2 7.7  Albumin 3.5 - 5.2 g/dL 4.3 4.2 4.2  AST 0 - 37 U/L 26 24 93(H)  ALT 0 - 35 U/L 27 23 121(H)  Alk Phosphatase 39 - 117 U/L 73 60 60  Total Bilirubin 0.2 - 1.2 mg/dL 0.5 0.6 0.5   Bilirubin, Direct 0.0 - 0.3 mg/dL 0.1 0.1 -    Lab Results  Component Value Date/Time   TSH 2.69 10/30/2020 10:53 AM   TSH 2.04 02/06/2020 01:34 PM    CBC Latest Ref Rng & Units 08/24/2020 10/04/2019 07/15/2019  WBC 4.0 - 10.5 K/uL 7.6 7.0 3.8(L)  Hemoglobin 12.0 - 15.0 g/dL 12.7 13.1 12.5  Hematocrit 36.0 - 46.0 % 38.0 39.4 36.3  Platelets 150 - 400 K/uL 228 266 196    Lab Results  Component Value Date/Time   VD25OH 52.95 11/30/2019 11:02 AM    Clinical ASCVD: No  The 10-year ASCVD risk score Mikey Bussing DC Jr., et al., 2013) is: 4%   Values used to calculate the score:     Age: 69 years     Sex: Female     Is Non-Hispanic African American: No     Diabetic: No     Tobacco smoker: No     Systolic Blood Pressure: 638 mmHg     Is BP treated: Yes     HDL Cholesterol: 63.7 mg/dL     Total Cholesterol: 189 mg/dL     Social History   Tobacco Use  Smoking Status Never  Smokeless Tobacco Never   BP Readings from Last 3 Encounters:  01/17/21 110/68  01/04/21 120/88  12/28/20 118/78   Pulse Readings from Last 3 Encounters:  01/17/21 82  01/04/21 70  12/28/20 80  Wt Readings from Last 3 Encounters:  01/17/21 165 lb 6.4 oz (75 kg)  12/28/20 167 lb 12.8 oz (76.1 kg)  12/20/20 162 lb (73.5 kg)    Assessment: Review of patient past medical history, allergies, medications, health status, including review of consultants reports, laboratory and other test data, was performed as part of comprehensive evaluation and provision of chronic care management services.   SDOH:  (Social Determinants of Health) assessments and interventions performed:  SDOH Interventions    Flowsheet Row Most Recent Value  SDOH Interventions   Financial Strain Interventions --  [evaluating patient assistance options,  care guide referral]  Transportation Interventions Other (Comment)  [care guide referral]       CCM Care Plan  No Known Allergies  Medications Reviewed Today     Reviewed by  De Hollingshead, RPH-CPP (Pharmacist) on 03/12/21 at 76  Med List Status: <None>   Medication Order Taking? Sig Documenting Provider Last Dose Status Informant  amLODipine (NORVASC) 2.5 MG tablet [id number] Yes Take 2.5 mg by mouth daily. [provider] Taking Active   Ascorbic Acid (VITAMIN C PO) [id number] No Take 1,000 mg by mouth daily.  Patient not taking: Reported on 03/12/2021   [provider] Not Taking Active   CALCIUM CITRATE PO [id number] Yes Take 600 mg by mouth daily. [provider] Taking Active   Cyanocobalamin (B-12 PO) [id number] No Take 1 tablet by mouth daily.  Patient not taking: Reported on 03/12/2021   [provider] Not Taking Active   cyclobenzaprine (FLEXERIL) 5 MG tablet [id number] Yes Take 1 tablet (5 mg total) by mouth at bedtime as needed for muscle spasms. McLean-Scocuzza, Nino Glow, MD Taking Active            Med Note ([place] Mar 12, 2021  9:53 AM) Using ~ once weekly  levothyroxine (SYNTHROID) 175 MCG tablet [id number] Yes Take 1 tablet (175 mcg total) by mouth daily before breakfast. 30 min before food McLean-Scocuzza, Nino Glow, MD Taking Active   MELATONIN PO [id number] Yes Take 1 tablet by mouth daily. [provider] Taking Active            Med Note Darnelle Maffucci, Arville Lime   Tue Jan 15, 2021  9:29 AM)    meloxicam (MOBIC) 7.5 MG tablet [id number] Yes Take 7.5 mg by mouth daily. [provider] Taking Active   Multiple Vitamins-Calcium (ONE-A-DAY WOMENS FORMULA PO) [id number] Yes Take 1 capsule by mouth daily. [provider] Taking Active   omeprazole (PRILOSEC) 40 MG capsule [id number] Yes Take 1 capsule (40 mg total) by mouth in the morning and at bedtime. Jonathon Bellows, MD Taking Active   Probiotic Product (PROBIOTIC DAILY PO) [id number] Yes Take by mouth. [provider] Taking Active   traZODone (DESYREL) 50 MG tablet [id number] Yes Take 0.5-1 tablets (25-50 mg total) by  mouth at bedtime as needed for sleep. McLean-Scocuzza, Nino Glow, MD Taking Active   TURMERIC-GINGER PO [id number] Yes Take 1 each by mouth daily. [provider] Taking Active   VITAMIN D, CHOLECALCIFEROL, PO [id number] Yes Take 5,000 mg by mouth daily at 12 noon. 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 12/07/2020   Insomnia 10/30/2020  Mild persistent asthma 10/30/2020   Esophagitis 10/30/2020   Brain fog 09/28/2020   Trochanteric bursitis of right hip 08/22/2020   Anxiety 06/15/2020   Overactive bladder 06/15/2020   Bronchospasm 06/15/2020   Allergic rhinitis 06/15/2020   Dysphagia 01/25/2020   H/O bilateral breast implants 10/28/2019   Chronic midline low back pain with right-sided sciatica 10/28/2019   Essential hypertension 10/28/2019   Breast pain, left 10/28/2019   Chronic right hip pain 10/28/2019   Osteoporosis 10/28/2019   Shortness of breath 10/28/2019   S/P lumbar fusion 10/28/2019   History of colon polyps 10/28/2019   Hypothyroidism    Depression, recurrent ([place])     Immunization History  Administered Date(s) Administered   Hepb-cpg 12/06/2019, 02/06/2020   Influenza,inj,Quad PF,6+ Mos 10/13/2018, 09/23/2019, 07/03/2020   PFIZER(Purple Top)SARS-COV-2 Vaccination 12/24/2019, 01/15/2020, 09/05/2020   Pneumococcal Polysaccharide-23 10/13/2018    Conditions to be addressed/monitored: HTN and Depression  Care Plan : Medication Management  Updates made by De Hollingshead, RPH-CPP since 03/12/2021 12:00 AM     Problem: Reactive Airway, HTN, Dysphagia, Anxiety      Long-Range Goal: Disease Progression Prevention   Start Date: 10/29/2020  This Visit's Progress: On track  Recent Progress: On track  Priority: High   Note:   Current Barriers:  Unable to independently afford treatment regimen Unable to achieve control of dysphagia  Low level of health literacy in regard to medication indications  Pharmacist Clinical Goal(s):  Over the next 90 days, patient will verbalize ability to afford treatment regimen achieve adherence to monitoring guidelines and medication adherence to achieve therapeutic efficacy through collaboration with PharmD and provider.   Interventions: 1:1 collaboration with McLean-Scocuzza, Nino Glow, MD regarding development and update of comprehensive plan of care as evidenced by provider attestation and co-signature Inter-disciplinary care team collaboration (see longitudinal plan of care) Comprehensive medication review performed; medication list updated in electronic medical record  SDOH: Reports concerns with medical bills right now. Care Guide referral placed   Hypertension: Controlled goal <130/80; current treatment: amlodipine 2.5 mg daily - had stopped at last PCP visit, but subsequent ED visit for headache that they believe was related to higher BP. Patient restarted amlodipine 2.5 mg Home BP readings: did not discuss today Previously recommended to continue current regimen at this time. Previously advised to check BP at home ~ once per week, document, and provide to PCP in the future.   Asthma/Reactive Airway disease: Improved; current treatment: none Hx esophageal thrush, may have been related to inhaled and/or nasal steroid Also stopped albuterol HFA  Last PFT: indicates reactive airway disease.  Approved for [place] assistance, if needed in the future.  Denies need for pharmacotherapy at this time  Dysphagia, ?GERD: Uncontrolled; currently taking omeprazole 40 mg BID per Dr. Vicente Males S/p endoscopy w/ Dr. Vicente Males w/ second round of candida treatment with fluconzole. Esophageal manometry completed last weke, patient asks about results.  Encouraged to contact Dr. Georgeann Oppenheim  office to discuss next steps for evaluation of swallowing.   Hypothyroidism: Controlled per last TSH; current regimen: levothyroxine 175 mcg daily Confirmed appropriate administration Continue current regimen at this time  Bursitis, Chronic Hip/Back pain: Uncontrolled; current treatment: restarted an old prescription for meloxicam 7.5 mg daily, using cyclobenzaprine 5 mg from PCP at least once weekly; [facility]/ [facility], has an upcoming visit with Dr. Holley Raring pain management Discussed long term renal and GI concerns with NSAIDs. Encouraged to only use meloxicam PRN, and only use if she feels  benefit.  Encouraged to keep appointment with pain management and continue collaboration with PM&R.   OAB: Uncontrolled but patient tolerating; current treatment: none. Patient choosing to avoid medication as she worries about effect on brain fog Previously tried:Toviaz 8 mg- notes improvement in OAB symptoms but some headache at night; approved for [place] assistance for this through 09/21/21 Agree with patient's decision at this time. Moving forward, could investigate insurance coverage for Myrbetriq/Gemtesa (though likely expensive and there is no patient assistance at this time) given lower incidence of anticholinergic effects.    Anxiety/Insomnia: Improved per patient report; current regimen: melatonin 1 mg QPM, trazodone 25-50 mg QPM - alternates melatonin and trazodone with benefit.  Previously prescribed sertraline but chose not to start. Previously educated on potential benefit of treatment of depression per neurology if this is a contributing factor Continue current regimen at this time. Continue collaboration with neurology  Osteoporosis: Untreated; current regimen: none Previous medications: believes she had been on alendronate (remembers a weekly pill that starts with a), knows she was on Prolia for 1-2 years, then had insurance coverage issues and reports she was switched to a  yearly IV medication x 1 dose (likely zoledronic acid) before she moved down here Supplements: calcium citrate 600 mg + 1-2 servings of dairy (milk or yogurt) daily, vitamin D 5000 units daily Most recent DEXA 01/28/21: t score -3.9, severe osteoporosis; hx ankle fracture Discussed treatment options. Given severe reduction in bone density, anabolic therapy an appropriate option to consider. Consider monthly injections of Evenity (patient has no history of CV disease). Daily Forteo unlikely to be a preferred option for patient. Would recommend monthly Evenity x 12 doses (can assist in connecting with [place] transportation to reduce financial burden of her taking an [place] Popper to appointments) then changing to Prolia therapy Q6 months. Will discuss with PCP.  Supplement: Reports use of Calcium citrate 600 mg, Vitamin D 5000 units daily, Vitamin C- ran out but plans to purchase; Vitamin B12 - ran out but plans to purchase, Vitamin E, melatonin, multivitamin, probiotic, turmeric Appropriate to continue current regimen at this time.    Patient Goals/Self-Care Activities Over the next 90 days, patient will:  - take medications as prescribed focus on medication adherence by using pill box collaborate with provider on medication access solutions  Follow Up Plan: Telephone follow up appointment with care management team member scheduled for: ~ 5 days      Medication Assistance: None required.  Patient affirms current coverage meets needs.  Patient's preferred pharmacy is:  [facility] [address], [place] - [place] [address] Phone: [phone] Fax: [phone]    Follow Up:  Patient agrees to Care Plan and Follow-up.  Plan: Telephone follow up appointment with care management team member scheduled for:  ~ 4 days  [name], PharmD, [place], [place] Clinical Pharmacist [employer] at [employer] [phone]

## 2021-03-12 NOTE — Telephone Encounter (Signed)
Dr. Tobi Bastos, patient is calling to get her esophageal manometry results. Please advise.

## 2021-03-13 NOTE — Telephone Encounter (Signed)
   Telephone encounter was:  Successful.  03/13/2021 Name: [name] MRN: [id number] DOB: Apr 02, 1956  [name] is a 65 y.o. year old female who is a primary care patient of McLean-Scocuzza, Pasty Spillers, MD . The community resource team was consulted for assistance with Transportation Needs  and Financial Difficulties related to medical bills and therapist in [place].   Care guide performed the following interventions: Patient provided with information about care guide support team and interviewed to confirm resource needs Discussed resources to assist with transportation outside of medical appointments, we revisited the two sources of [employer] and [employer] that I sent to her back in July of 2021 referral. We discussed the medical bills she has and there isn't a program for her to get them paid until she reaches a $5000, she said that she would call [facility] and find out if she can make better payments that fit into her budget. I did give her a number to a therapist located in [place] for her to call and see if they are able to take her insurance. At this time she has no other community resource needs .  Follow Up Plan:  No further follow up planned at this time. The patient has been provided with needed resources.  Rojelio Brenner Care Guide, Embedded Care Coordination [facility], Care Management Phone: [phone] Email: [email or web address]

## 2021-03-19 NOTE — Progress Notes (Signed)
Ok for [employer] x 1 year and then transition to prolia pt aware and agreeable  Can we get approved   Thank you

## 2021-03-21 ENCOUNTER — Ambulatory Visit: Payer: Medicare Other | Admitting: Pharmacist

## 2021-03-21 DIAGNOSIS — M810 Age-related osteoporosis without current pathological fracture: Secondary | ICD-10-CM

## 2021-03-21 DIAGNOSIS — I10 Essential (primary) hypertension: Secondary | ICD-10-CM | POA: Diagnosis not present

## 2021-03-21 DIAGNOSIS — E039 Hypothyroidism, unspecified: Secondary | ICD-10-CM

## 2021-03-21 DIAGNOSIS — F339 Major depressive disorder, recurrent, unspecified: Secondary | ICD-10-CM | POA: Diagnosis not present

## 2021-03-21 NOTE — Patient Instructions (Signed)
Visit Information  PATIENT GOALS:  Goals Addressed               This Visit's Progress     Patient Stated     Medication Monitoring (pt-stated)        Patient Goals/Self-Care Activities Over the next 90 days, patient will:  - take medications as prescribed - collaborate with provider on medication access solutions.          Patient verbalizes understanding of instructions provided today and agrees to view in MyChart.   Plan: Telephone follow up appointment with care management team member scheduled for:  ~ 12 weeks  Catie Ilaisaane Marts, PharmD, BCACP, CPP Clinical Pharmacist [facility] at [place] [phone] 

## 2021-03-21 NOTE — Chronic Care Management (AMB) (Signed)
Chronic Care Management Pharmacy Note  03/21/2021 Name:  [name] MRN:  [id number] DOB:  Mar 14, 1956  Subjective: [name] is an 65 y.o. year old female who is a primary patient of McLean-Scocuzza, Nino Glow, MD.  The CCM team was consulted for assistance with disease management and care coordination needs.    Engaged with patient by telephone for  follow up  in response to provider referral for pharmacy case management and/or care coordination services.   Consent to Services:  The patient was given information about Chronic Care Management services, agreed to services, and gave verbal consent prior to initiation of services.  Please see initial visit note for detailed documentation.   Patient Care Team: McLean-Scocuzza, Nino Glow, MD as PCP - General (Internal Medicine) De Hollingshead, RPH-CPP (Pharmacist)   Objective:  Lab Results  Component Value Date   CREATININE 0.71 08/24/2020   CREATININE 0.70 10/04/2019   CREATININE 0.60 07/15/2019    Lab Results  Component Value Date   HGBA1C 5.6 11/30/2019   Last diabetic Eye exam: No results found for: HMDIABEYEEXA  Last diabetic Foot exam: No results found for: HMDIABFOOTEX      Component Value Date/Time   CHOL 189 10/30/2020 1053   TRIG 173.0 (H) 10/30/2020 1053   HDL 63.70 10/30/2020 1053   CHOLHDL 3 10/30/2020 1053   VLDL 34.6 10/30/2020 1053   LDLCALC 90 10/30/2020 1053    Hepatic Function Latest Ref Rng & Units 10/30/2020 11/30/2019 07/15/2019  Total Protein 6.0 - 8.3 g/dL 7.0 7.2 7.7  Albumin 3.5 - 5.2 g/dL 4.3 4.2 4.2  AST 0 - 37 U/L 26 24 93(H)  ALT 0 - 35 U/L 27 23 121(H)  Alk Phosphatase 39 - 117 U/L 73 60 60  Total Bilirubin 0.2 - 1.2 mg/dL 0.5 0.6 0.5  Bilirubin, Direct 0.0 - 0.3 mg/dL 0.1 0.1 -    Lab Results  Component Value Date/Time   TSH 2.69 10/30/2020 10:53 AM   TSH 2.04 02/06/2020 01:34 PM    CBC Latest Ref Rng & Units 08/24/2020 10/04/2019 07/15/2019  WBC 4.0 - 10.5 K/uL 7.6 7.0  3.8(L)  Hemoglobin 12.0 - 15.0 g/dL 12.7 13.1 12.5  Hematocrit 36.0 - 46.0 % 38.0 39.4 36.3  Platelets 150 - 400 K/uL 228 266 196    Lab Results  Component Value Date/Time   VD25OH 52.95 11/30/2019 11:02 AM    Clinical ASCVD: No  The 10-year ASCVD risk score Mikey Bussing DC Jr., et al., 2013) is: 4%   Values used to calculate the score:     Age: 60 years     Sex: Female     Is Non-Hispanic African American: No     Diabetic: No     Tobacco smoker: No     Systolic Blood Pressure: 127 mmHg     Is BP treated: Yes     HDL Cholesterol: 63.7 mg/dL     Total Cholesterol: 189 mg/dL      Social History   Tobacco Use  Smoking Status Never  Smokeless Tobacco Never   BP Readings from Last 3 Encounters:  01/17/21 110/68  01/04/21 120/88  12/28/20 118/78   Pulse Readings from Last 3 Encounters:  01/17/21 82  01/04/21 70  12/28/20 80   Wt Readings from Last 3 Encounters:  01/17/21 165 lb 6.4 oz (75 kg)  12/28/20 167 lb 12.8 oz (76.1 kg)  12/20/20 162 lb (73.5 kg)    Assessment: Review of patient past  medical history, allergies, medications, health status, including review of consultants reports, laboratory and other test data, was performed as part of comprehensive evaluation and provision of chronic care management services.   SDOH:  (Social Determinants of Health) assessments and interventions performed:    CCM Care Plan  No Known Allergies  Medications Reviewed Today     Reviewed by De Hollingshead, RPH-CPP (Pharmacist) on 03/12/21 at 62  Med List Status: <None>   Medication Order Taking? Sig Documenting Provider Last Dose Status Informant  amLODipine (NORVASC) 2.5 MG tablet [id number] Yes Take 2.5 mg by mouth daily. [provider] Taking Active   Ascorbic Acid (VITAMIN C PO) [id number] No Take 1,000 mg by mouth daily.  Patient not taking: Reported on 03/12/2021   [provider] Not Taking Active   CALCIUM CITRATE PO [id number] Yes Take 600 mg by  mouth daily. [provider] Taking Active   Cyanocobalamin (B-12 PO) [id number] No Take 1 tablet by mouth daily.  Patient not taking: Reported on 03/12/2021   [provider] Not Taking Active   cyclobenzaprine (FLEXERIL) 5 MG tablet [id number] Yes Take 1 tablet (5 mg total) by mouth at bedtime as needed for muscle spasms. McLean-Scocuzza, Nino Glow, MD Taking Active            Med Note ([place] Mar 12, 2021  9:53 AM) Using ~ once weekly  levothyroxine (SYNTHROID) 175 MCG tablet [id number] Yes Take 1 tablet (175 mcg total) by mouth daily before breakfast. 30 min before food McLean-Scocuzza, Nino Glow, MD Taking Active   MELATONIN PO [id number] Yes Take 1 tablet by mouth daily. [provider] Taking Active            Med Note Darnelle Maffucci, Arville Lime   Tue Jan 15, 2021  9:29 AM)    meloxicam (MOBIC) 7.5 MG tablet [id number] Yes Take 7.5 mg by mouth daily. [provider] Taking Active   Multiple Vitamins-Calcium (ONE-A-DAY WOMENS FORMULA PO) [id number] Yes Take 1 capsule by mouth daily. [provider] Taking Active   omeprazole (PRILOSEC) 40 MG capsule [id number] Yes Take 1 capsule (40 mg total) by mouth in the morning and at bedtime. Jonathon Bellows, MD Taking Active   Probiotic Product (PROBIOTIC DAILY PO) [id number] Yes Take by mouth. [provider] Taking Active   traZODone (DESYREL) 50 MG tablet [id number] Yes Take 0.5-1 tablets (25-50 mg total) by mouth at bedtime as needed for sleep. McLean-Scocuzza, Nino Glow, MD Taking Active   TURMERIC-GINGER PO [id number] Yes Take 1 each by mouth daily. [provider] Taking Active   VITAMIN D, CHOLECALCIFEROL, PO [id number] Yes Take 5,000 mg by mouth daily at 12 noon. 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 12/07/2020   Insomnia 10/30/2020   Mild persistent asthma 10/30/2020   Esophagitis 10/30/2020   Brain fog 09/28/2020   Trochanteric bursitis of right hip 08/22/2020   Anxiety 06/15/2020   Overactive bladder 06/15/2020   Bronchospasm 06/15/2020   Allergic rhinitis 06/15/2020   Dysphagia 01/25/2020   H/O bilateral breast implants 10/28/2019   Chronic midline low back pain with right-sided sciatica 10/28/2019   Essential hypertension 10/28/2019   Breast pain, left 10/28/2019   Chronic right hip  pain 10/28/2019   Osteoporosis 10/28/2019   Shortness of breath 10/28/2019   S/P lumbar fusion 10/28/2019   History of colon polyps 10/28/2019   Hypothyroidism    Depression, recurrent ([place])     Immunization History  Administered Date(s) Administered   Hepb-cpg 12/06/2019, 02/06/2020   Influenza,inj,Quad PF,6+ Mos 10/13/2018, 09/23/2019, 07/03/2020   PFIZER(Purple Top)SARS-COV-2 Vaccination 12/24/2019, 01/15/2020, 09/05/2020   Pneumococcal Polysaccharide-23 10/13/2018    Conditions to be addressed/monitored: HTN, Anxiety, and Pulmonary Disease  Care Plan : Medication Management  Updates made by De Hollingshead, RPH-CPP since 03/21/2021 12:00 AM     Problem: Reactive Airway, HTN, Dysphagia, Anxiety      Long-Range Goal: Disease Progression Prevention   Start Date: 10/29/2020  This Visit's Progress: On track  Recent Progress: On track  Priority: High  Note:   Current Barriers:  Unable to independently afford treatment regimen Unable to achieve control of dysphagia  Low level of health literacy in regard to medication indications  Pharmacist Clinical Goal(s):  Over the next 90 days, patient will verbalize ability to afford treatment regimen achieve adherence to monitoring guidelines and medication adherence to achieve therapeutic efficacy through collaboration with PharmD and provider.   Interventions: 1:1  collaboration with McLean-Scocuzza, Nino Glow, MD regarding development and update of comprehensive plan of care as evidenced by provider attestation and co-signature Inter-disciplinary care team collaboration (see longitudinal plan of care) Comprehensive medication review performed; medication list updated in electronic medical record  SDOH: Reports concerns with medical bills right now. Collaborating with [place].   Hypertension: Controlled goal <130/80; current treatment: amlodipine 2.5 mg daily Home BP readings: did not discuss today Previously recommended to continue current regimen at this time. Previously advised to check BP at home ~ once per week, document, and provide to PCP in the future.   Asthma/Reactive Airway disease: Improved; current treatment: none Hx esophageal thrush, may have been related to inhaled and/or nasal steroid Also stopped albuterol HFA  Last PFT: indicates reactive airway disease.  Approved for [place] assistance, if needed in the future.  Denies need for pharmacotherapy at this time  Dysphagia, ?GERD: Uncontrolled; currently taking omeprazole 40 mg BID per Dr. Vicente Males S/p endoscopy w/ Dr. Vicente Males w/ second round of candida treatment with fluconzole. Esophageal manometry completed, patient still awaiting results/interpretation and next steps. Recommended to continue collaboration with GI.  Hypothyroidism: Controlled per last TSH; current regimen: levothyroxine 175 mcg daily Previously recommended to continue current regimen at this time  Bursitis, Chronic Hip/Back pain: Uncontrolled; current treatment: restarted an old prescription for meloxicam 7.5 mg daily, using cyclobenzaprine 5 mg from PCP at least once weekly; follows w/ PM&R at [facility], has an upcoming visit with Dr. Holley Raring pain management Previously discussed long term renal and GI concerns with NSAIDs. Encouraged to only use meloxicam PRN, and only use if she feels benefit.  Previous  encouraged to keep appointment with pain management and continue collaboration with PM&R.   OAB: Uncontrolled but patient tolerating; current treatment: none. Patient choosing to avoid medication as she worries about effect on brain fog Previously tried:Toviaz 8 mg- notes improvement in OAB symptoms but some headache at night; approved for [place] assistance for this through 09/21/21 Moving forward, could investigate insurance coverage for Myrbetriq/Gemtesa (though likely expensive and there is no patient assistance at this time) given lower incidence of anticholinergic effects.    Anxiety/Insomnia: Improved per patient report; current regimen: melatonin 1 mg QPM, trazodone 25-50 mg QPM - alternates melatonin and trazodone  with benefit.  Previously prescribed sertraline but chose not to start. Previously educated on potential benefit of treatment of depression per neurology if this is a contributing factor Previously recommended to continue current regimen at this time. Continue collaboration with neurology  Osteoporosis: Untreated; current regimen: none Previous medications: believes she had been on alendronate (remembers a weekly pill that starts with a), knows she was on Prolia for 1-2 years, then had insurance coverage issues and reports she was switched to a yearly IV medication x 1 dose (likely zoledronic acid) before she moved down here Supplements: calcium citrate 600 mg + 1-2 servings of dairy (milk or yogurt) daily, vitamin D 5000 units daily Most recent DEXA 01/28/21: t score -3.9, severe osteoporosis; hx ankle fracture Discussed treatment options with PCP. Recommended Evenity x1 year then change to Prolia or IV bisphosphonate (avoid PO given patient swallowing issues). Clinic RN will investigate insurance coverage. Communicated this to patient. She is concerned about the potential cost, but agrees to at least let us investigate the cost.   Supplement: Reports use of Calcium citrate 600  mg, Vitamin D 5000 units daily, Vitamin C- ran out but plans to purchase; Vitamin B12 - ran out but plans to purchase, Vitamin E, melatonin, multivitamin, probiotic, turmeric Appropriate to continue current regimen at this time.    Patient Goals/Self-Care Activities Over the next 90 days, patient will:  - take medications as prescribed focus on medication adherence by using pill box collaborate with provider on medication access solutions  Follow Up Plan: Telephone follow up appointment with care management team member scheduled for: ~ 5 days      Medication Assistance: None required.  Patient affirms current coverage meets needs.  Patient's preferred pharmacy is:  [facility] [address], [place] - [address] [place] [place] [address] Phone: [phone] Fax: [phone]   Follow Up:  Patient agrees to Care Plan and Follow-up.  Plan: Telephone follow up appointment with care management team member scheduled for:  ~ 12 weeks  [name], PharmD, [place], [place] Clinical Pharmacist [employer] [facility] [employer] [phone]

## 2021-03-26 ENCOUNTER — Telehealth: Payer: Self-pay | Admitting: Gastroenterology

## 2021-03-26 NOTE — Telephone Encounter (Signed)
LVM that she hasn't heard from her procedure results. She is still having difficulty swallowing. Please advise.

## 2021-03-27 NOTE — Telephone Encounter (Signed)
The manometry was done at [facility] , Dr Lavon Paganini , can we check whats the status?

## 2021-03-27 NOTE — Telephone Encounter (Signed)
Patient had esophageal manometry done on 03/04/2021. Please advise

## 2021-03-28 NOTE — Telephone Encounter (Signed)
The results are in epic now

## 2021-03-28 NOTE — Telephone Encounter (Signed)
Tried to call patient but voicemail is not set up. Sent patient a Clinical cytogeneticist message with the date and time of her mychart visit

## 2021-03-29 ENCOUNTER — Other Ambulatory Visit: Payer: Self-pay

## 2021-03-29 ENCOUNTER — Ambulatory Visit
Admission: RE | Admit: 2021-03-29 | Discharge: 2021-03-29 | Disposition: A | Discharge status: Home / Self Care | Payer: Medicare Other | Source: Ambulatory Visit | Attending: Internal Medicine | Admitting: Internal Medicine

## 2021-03-29 DIAGNOSIS — R1013 Epigastric pain: Secondary | ICD-10-CM | POA: Diagnosis not present

## 2021-04-02 ENCOUNTER — Other Ambulatory Visit: Payer: Self-pay

## 2021-04-02 ENCOUNTER — Telehealth: Payer: Self-pay | Admitting: *Deleted

## 2021-04-02 ENCOUNTER — Ambulatory Visit (INDEPENDENT_AMBULATORY_CARE_PROVIDER_SITE_OTHER): Payer: Medicare Other | Admitting: Internal Medicine

## 2021-04-02 VITALS — BP 110/70 | HR 71 | Temp 97.8°F | Ht 65.0 in | Wt 166.6 lb

## 2021-04-02 DIAGNOSIS — M5441 Lumbago with sciatica, right side: Secondary | ICD-10-CM

## 2021-04-02 DIAGNOSIS — E559 Vitamin D deficiency, unspecified: Secondary | ICD-10-CM | POA: Diagnosis not present

## 2021-04-02 DIAGNOSIS — G8929 Other chronic pain: Secondary | ICD-10-CM

## 2021-04-02 DIAGNOSIS — E039 Hypothyroidism, unspecified: Secondary | ICD-10-CM | POA: Diagnosis not present

## 2021-04-02 DIAGNOSIS — Z981 Arthrodesis status: Secondary | ICD-10-CM

## 2021-04-02 DIAGNOSIS — R739 Hyperglycemia, unspecified: Secondary | ICD-10-CM

## 2021-04-02 DIAGNOSIS — M898X9 Other specified disorders of bone, unspecified site: Secondary | ICD-10-CM

## 2021-04-02 DIAGNOSIS — M25551 Pain in right hip: Secondary | ICD-10-CM

## 2021-04-02 DIAGNOSIS — F339 Major depressive disorder, recurrent, unspecified: Secondary | ICD-10-CM

## 2021-04-02 DIAGNOSIS — I10 Essential (primary) hypertension: Secondary | ICD-10-CM

## 2021-04-02 DIAGNOSIS — Z1231 Encounter for screening mammogram for malignant neoplasm of breast: Secondary | ICD-10-CM

## 2021-04-02 DIAGNOSIS — M62838 Other muscle spasm: Secondary | ICD-10-CM

## 2021-04-02 DIAGNOSIS — K224 Dyskinesia of esophagus: Secondary | ICD-10-CM

## 2021-04-02 DIAGNOSIS — M810 Age-related osteoporosis without current pathological fracture: Secondary | ICD-10-CM | POA: Diagnosis not present

## 2021-04-02 MED ORDER — CYCLOBENZAPRINE HCL 5 MG PO TABS: 5.0000 mg | ORAL_TABLET | Freq: Every evening | ORAL | 2 refills | Status: DC | PRN

## 2021-04-02 NOTE — Telephone Encounter (Signed)
PA started for Eventity on [employer] portal patien twill need an updated calcium and vitamin D level before starting therapy.

## 2021-04-02 NOTE — Telephone Encounter (Signed)
-----   Message from Bevelyn Buckles, MD sent at 03/19/2021  6:13 PM EDT -----    ----- Message ----- From: Lourena Simmonds, RPH-CPP Sent: 03/12/2021  10:27 AM EDT To: Pasty Spillers McLean-Scocuzza, MD  Given how severe her t score was, I think anabolic therapy would be most appropriate. Evenity is billed the same way as Prolia, Olegario Messier does the PA, I think doing Evenity for a year and then switching to Prolia would best build up that bone density and allow Prolia to maintain. Thoughts?

## 2021-04-02 NOTE — Patient Instructions (Addendum)
Consider covid 19 pfizer now   [employer] monthly x 1 year then transition to prolia every 6 months   Romosozumab injection What is this medication? ROMOSOZUMAB (roe moe SOZ ue mab) increases bone formation. It is used to treatosteoporosis in women. This medicine may be used for other purposes; ask your health care provider orpharmacist if you have questions. COMMON BRAND NAME(S): EVENITY What should I tell my care team before I take this medication? They need to know if you have any of these conditions: dental disease or wear dentures heart disease history of stroke kidney disease low levels of calcium in the blood an unusual or allergic reaction to romosozumab, other medicines, foods, dyes or preservatives pregnant or trying to get pregnant breast-feeding How should I use this medication? This medicine is for injection under the skin. It is given by a healthcareprofessional in a hospital or clinic setting. Talk to your pediatrician about the use of this medicine in children. Specialcare may be needed. Overdosage: If you think you have taken too much of this medicine contact apoison control center or emergency room at once. NOTE: This medicine is only for you. Do not share this medicine with others. What if I miss a dose? Keep appointments for follow-up doses. It is important not to miss your dose. Call your doctor or healthcare professional if you are unable to keep anappointment. What may interact with this medication? Interactions are not expected. This list may not describe all possible interactions. Give your health care provider a list of all the medicines, herbs, non-prescription drugs, or dietary supplements you use. Also tell them if you smoke, drink alcohol, or use illegaldrugs. Some items may interact with your medicine. What should I watch for while using this medication? Your condition will be monitored carefully while you are receiving thismedicine. You may need blood  work done while you are taking this medicine. Some people who take this medicine have severe bone, joint, or muscle pain. This medicine may also increase your risk for jaw problems or a broken thigh bone. Tell your healthcare professional right away if you have severe pain in your jaw, bones, joints, or muscles. Tell your healthcare professional if youhave any pain that does not go away or that gets worse. You should make sure you get enough calcium and vitamin D while you are taking this medicine. Discuss the foods you eat and the vitamins you take with yourhealthcare professional. Tell your dentist and dental surgeon that you are taking this medicine. You should not have major dental surgery while on this medicine. See your dentist to have a dental exam and fix any dental problems before starting this medicine. Take good care of your teeth while on this medicine. Make sure yousee your dentist for regular follow-up appointments. What side effects may I notice from receiving this medication? Side effects that you should report to your doctor or health care professionalas soon as possible: allergic reactions like skin rash, itching or hives, swelling of the face, lips, or tongue bone pain chest pain or chest tightness jaw pain, especially after dental work signs and symptoms of a stroke like changes in vision; confusion; trouble speaking or understanding; severe headaches; sudden numbness or weakness of the face, arm or leg; trouble walking; dizziness; loss of balance or coordination signs and symptoms of low calcium like fast heartbeat; muscle cramps; muscle pain; pain, tingling, numbness in the hands or feet; seizures Side effects that usually do not require medical attention (report these toyour doctor  or health care professional if they continue or are bothersome): headache joint pain pain, redness, or irritation at site where injected pain, tingling, numbness in the hands or feet swelling of  ankles, feet, hands trouble sleeping This list may not describe all possible side effects. Call your doctor for medical advice about side effects. You may report side effects to FDA at1-800-FDA-1088. Where should I keep my medication? This medicine is given in a hospital or clinic and will not be stored at home. NOTE: This sheet is a summary. It may not cover all possible information. If you have questions about this medicine, talk to your doctor, pharmacist, orhealth care provider.  2022 Elsevier/Gold Standard (2017-12-31 01:15:32)
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
## 2021-04-02 NOTE — Progress Notes (Signed)
Pre visit review using our clinic review tool, if applicable. No additional management support is needed unless otherwise documented below in the visit note. 

## 2021-04-03 ENCOUNTER — Telehealth (INDEPENDENT_AMBULATORY_CARE_PROVIDER_SITE_OTHER): Payer: Medicare Other | Admitting: Gastroenterology

## 2021-04-03 ENCOUNTER — Encounter: Payer: Self-pay | Admitting: Internal Medicine

## 2021-04-03 DIAGNOSIS — T7840XA Allergy, unspecified, initial encounter: Secondary | ICD-10-CM

## 2021-04-03 DIAGNOSIS — K219 Gastro-esophageal reflux disease without esophagitis: Secondary | ICD-10-CM | POA: Diagnosis not present

## 2021-04-03 LAB — COMPREHENSIVE METABOLIC PANEL
ALT: 21 U/L (ref 0–35)
AST: 22 U/L (ref 0–37)
Albumin: 4.6 g/dL (ref 3.5–5.2)
Alkaline Phosphatase: 81 U/L (ref 39–117)
BUN: 20 mg/dL (ref 6–23)
CO2: 30 mEq/L (ref 19–32)
Calcium: 10 mg/dL (ref 8.4–10.5)
Chloride: 103 mEq/L (ref 96–112)
Creatinine, Ser: 0.66 mg/dL (ref 0.40–1.20)
GFR: 93.55 mL/min (ref >60.00)
Glucose, Bld: 90 mg/dL (ref 70–99)
Potassium: 4.2 mEq/L (ref 3.5–5.1)
Sodium: 139 mEq/L (ref 135–145)
Total Bilirubin: 0.5 mg/dL (ref 0.2–1.2)
Total Protein: 7.3 g/dL (ref 6.0–8.3)

## 2021-04-03 LAB — CBC WITH DIFFERENTIAL/PLATELET
Basophils Absolute: 0 10*3/uL (ref 0.0–0.1)
Basophils Relative: 1 % (ref 0.0–3.0)
Eosinophils Absolute: 0.1 10*3/uL (ref 0.0–0.7)
Eosinophils Relative: 2.3 % (ref 0.0–5.0)
HCT: 37.7 % (ref 36.0–46.0)
Hemoglobin: 12.8 g/dL (ref 12.0–15.0)
Lymphocytes Relative: 32.3 % (ref 12.0–46.0)
Lymphs Abs: 1.6 10*3/uL (ref 0.7–4.0)
MCHC: 33.9 g/dL (ref 30.0–36.0)
MCV: 86.6 fl (ref 78.0–100.0)
Monocytes Absolute: 0.4 10*3/uL (ref 0.1–1.0)
Monocytes Relative: 7.9 % (ref 3.0–12.0)
Neutro Abs: 2.8 10*3/uL (ref 1.4–7.7)
Neutrophils Relative %: 56.5 % (ref 43.0–77.0)
Platelets: 207 10*3/uL (ref 150.0–400.0)
RBC: 4.35 Mil/uL (ref 3.87–5.11)
RDW: 13.1 % (ref 11.5–15.5)
WBC: 5 10*3/uL (ref 4.0–10.5)

## 2021-04-03 LAB — HEMOGLOBIN A1C: Hgb A1c MFr Bld: 5.6 % (ref 4.6–6.5)

## 2021-04-03 LAB — TSH: TSH: 3.3 u[IU]/mL (ref 0.35–5.50)

## 2021-04-03 LAB — VITAMIN D 25 HYDROXY (VIT D DEFICIENCY, FRACTURES): VITD: 71.21 ng/mL (ref 30.00–100.00)

## 2021-04-03 MED ORDER — SUCRALFATE 1 G PO TABS: 1.0000 g | ORAL_TABLET | Freq: Four times a day (QID) | ORAL | 2 refills | Status: DC

## 2021-04-03 MED ORDER — NORTRIPTYLINE HCL 25 MG PO CAPS: 25.0000 mg | ORAL_CAPSULE | Freq: Every day | ORAL | 2 refills | Status: DC

## 2021-04-03 NOTE — Progress Notes (Signed)
Wyline Mood , MD [address]  [address]  [place], [place] [address]  Main: [phone]  Fax: [phone]   Primary Care Physician: McLean-Scocuzza, Pasty Spillers, MD  Virtual Visit via Video Note  I connected with patient on 04/03/21 at  9:00 AM EDT by video and verified that I am speaking with the correct person using two identifiers.   I discussed the limitations, risks, security and privacy concerns of performing an evaluation and management service by video  and the availability of in person appointments. I also discussed with the patient that there may be a patient responsible charge related to this service. The patient expressed understanding and agreed to proceed.  Location of Patient: Home Location of Provider: Home Persons involved: Patient and provider only   History of Present Illness:   Chief complaint follow-up for dysphagia   HPI: [name] is a 65 y.o. female   Summary of history :  Initially referred and seen on 10/15/2020 for dysphagia for a few months at every meal, solids> liquids , no weight loss , she had been on Prilosec 40 mg . She presented to the ER on 08/24/2020 with difficulty swallowing , referred to see me as an OP.  She states that she was seen by ENT had a laryngoscopy and was told she had no abnormalities and needed to pursue GI evaluation.   09/23/2020: EGD: Features of esophageal candida treated with Diflucan .  KOH stain showed budding yeast  I treated her with a total of 4 weeks of Diflucan for candidiasis seen on her endoscopy in January.  I repeated her EGD in March to confirm that the candidiasis had resolved as she was having recurrence of dysphagia and noted no abnormalities.  I subsequently ordered a barium test on 01/01/2021 with tablet which showed tertiary contractions of the distal half of the esophagus and mild gerd.     Interval history   01/17/2021-04/03/2021 01/01/2021: Barium swallow showed tertiary contraction of the distal  half of the esophagus mild GERD.   03/04/2021: Impedance and pH study: Normal esophageal acid exposure.  Overall reflux events within normal limits.  Weekly acid reflux episodes.  Good symptom correlation for dysphagia with reflux events.  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  No abnormal peristaltic abnormality detected.  Slightly elevated upper esophageal sphincter pressure noted.   Still has issues with swallowing ,takes PPI BID after meals   Current Outpatient Medications  Medication Sig Dispense Refill   amLODipine (NORVASC) 2.5 MG tablet Take 2.5 mg by mouth daily.     Ascorbic Acid (VITAMIN C PO) Take 1,000 mg by mouth daily.     CALCIUM CITRATE PO Take 600 mg by mouth daily.     Cyanocobalamin (B-12 PO) Take 1 tablet by mouth daily.     cyclobenzaprine (FLEXERIL) 5 MG tablet Take 1 tablet (5 mg total) by mouth at bedtime as needed for muscle spasms. 30 tablet 2   famotidine (PEPCID) 20 MG tablet Take 20 mg by mouth daily.     levothyroxine (SYNTHROID) 175 MCG tablet Take 1 tablet (175 mcg total) by mouth daily before breakfast. 30 min before food 90 tablet 3   MELATONIN PO Take 1 tablet by mouth daily.     meloxicam (MOBIC) 7.5 MG tablet Take 7.5 mg by mouth daily.     Multiple Vitamins-Calcium (ONE-A-DAY WOMENS FORMULA PO) Take 1 capsule by mouth daily.  omeprazole (PRILOSEC) 40 MG capsule Take 1 capsule (40 mg total) by mouth in the morning and at bedtime. 60 capsule 2   Probiotic Product (PROBIOTIC DAILY PO) Take by mouth.     traZODone (DESYREL) 50 MG tablet Take 0.5-1 tablets (25-50 mg total) by mouth at bedtime as needed for sleep. 90 tablet 3   TURMERIC-GINGER PO Take 1 each by mouth daily.     VITAMIN D, CHOLECALCIFEROL, PO Take 5,000 mg by mouth daily at 12 noon.     No current facility-administered medications for this visit.    Allergies as of 04/03/2021   (No Known Allergies)    Review  of Systems:    All systems reviewed and negative except where noted in HPI.  General Appearance:    Alert, cooperative, no distress, appears stated age  Head:    Normocephalic, without obvious abnormality, atraumatic  Eyes:    PERRL, conjunctiva/corneas clear,  Ears:    Grossly normal hearing    Neurologic:  Grossly normal    Observations/Objective:  Labs: CMP     Component Value Date/Time   NA 139 08/24/2020 1627   K 4.1 08/24/2020 1627   CL 104 08/24/2020 1627   CO2 23 08/24/2020 1627   GLUCOSE 152 (H) 08/24/2020 1627   BUN 24 (H) 08/24/2020 1627   CREATININE 0.71 08/24/2020 1627   CALCIUM 10.0 08/24/2020 1627   PROT 7.3 04/02/2021 1503   ALBUMIN 4.3 10/30/2020 1053   AST 26 10/30/2020 1053   ALT 27 10/30/2020 1053   ALKPHOS 73 10/30/2020 1053   BILITOT 0.5 10/30/2020 1053   GFRNONAA >60 08/24/2020 1627   GFRAA >60 10/04/2019 1524   Lab Results  Component Value Date   WBC 7.6 08/24/2020   HGB 12.7 08/24/2020   HCT 38.0 08/24/2020   MCV 87.4 08/24/2020   PLT 228 08/24/2020    Imaging Studies: US Abdomen Complete  Result Date: 03/29/2021 CLINICAL DATA:  Epigastric pain EXAM: ABDOMEN ULTRASOUND COMPLETE COMPARISON:  None. FINDINGS: Gallbladder: No gallstones or wall thickening visualized. No sonographic Murphy sign noted by sonographer. Common bile duct: Diameter: 3 mm Liver: No focal lesion identified. Within normal limits in parenchymal echogenicity. Portal vein is patent on color Doppler imaging with normal direction of blood flow towards the liver. IVC: No abnormality visualized. Pancreas: Visualized portion unremarkable. Spleen: Size and appearance within normal limits. Right Kidney: Length: 11.4 cm. Echogenicity within normal limits. No mass or hydronephrosis visualized. Left Kidney: Length: 10 cm. Echogenicity within normal limits. No mass or hydronephrosis visualized. Abdominal aorta: No aneurysm visualized. Other findings: None. IMPRESSION: Negative examination  Electronically Signed   By: Jasmine Pang M.D.   On: 03/29/2021 21:14    Assessment and Plan:   [name] is a 65 y.o. y/o female  here today to see me as a follow-up for dysphagia.  Recent esophageal manometry and pH studies show that there is correlation with reflux episodes and dysphagia and in addition nonspecific esophageal spasms were noted.  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  Plan  Continue omeprazole 40 mg twice daily before meals which she hasnt been taking  Pepcid 40 mg once a day at night  Carafate up to 4 times a day Nortriptyline 25 mg daily at night 5. Trazodone to be stopped due to drug interaction    I discussed the assessment and treatment plan with the patient. The patient was provided an opportunity to ask questions and all were answered. The patient agreed with the plan and  demonstrated an understanding of the instructions.   The patient was advised to call back or seek an in-person evaluation if the symptoms worsen or if the condition fails to improve as anticipated.  I provided 20 minutes of face-to-face time during this encounter.  Dr Wyline Mood MD,MRCP [facility]) Gastroenterology/Hepatology Pager: [phone]   Speech recognition software was used to dictate this note.

## 2021-04-05 LAB — PROTEIN ELECTROPHORESIS, URINE REFLEX
Albumin ELP, Urine: 100 %
Alpha-1-Globulin, U: 0 %
Alpha-2-Globulin, U: 0 %
Beta Globulin, U: 0 %
Gamma Globulin, U: 0 %
Protein, Ur: 6.8 mg/dL

## 2021-04-05 LAB — PROTEIN ELECTROPHORESIS, SERUM
A/G Ratio: 1.4 (ref 0.7–1.7)
Albumin ELP: 4.2 g/dL (ref 2.9–4.4)
Alpha 1: 0.2 g/dL (ref 0.0–0.4)
Alpha 2: 0.7 g/dL (ref 0.4–1.0)
Beta: 1 g/dL (ref 0.7–1.3)
Gamma Globulin: 1.3 g/dL (ref 0.4–1.8)
Globulin, Total: 3.1 g/dL (ref 2.2–3.9)
Total Protein: 7.3 g/dL (ref 6.0–8.5)

## 2021-04-05 NOTE — Telephone Encounter (Signed)
[name] Key: [id number]

## 2021-04-08 ENCOUNTER — Telehealth: Payer: Self-pay

## 2021-04-08 ENCOUNTER — Ambulatory Visit: Payer: Medicare Other

## 2021-04-08 NOTE — Telephone Encounter (Signed)
[employer] called to advise that the Eventity was denied coverage and to provide alternatives such as:  Prolia which will require PA for review Tymlos which will require PA for review Zoledronic Acid   [employer] can be reached at [phone] for further info and questions

## 2021-04-08 NOTE — Telephone Encounter (Signed)
Patient request cancel appointment today and reschedule. Appointment cancel, reschedule later date.

## 2021-04-09 ENCOUNTER — Encounter: Payer: Self-pay | Admitting: Student in an Organized Health Care Education/Training Program

## 2021-04-09 ENCOUNTER — Ambulatory Visit
Payer: Medicare Other | Attending: Student in an Organized Health Care Education/Training Program | Admitting: Student in an Organized Health Care Education/Training Program

## 2021-04-09 ENCOUNTER — Other Ambulatory Visit: Payer: Self-pay

## 2021-04-09 VITALS — BP 115/89 | HR 76 | Temp 97.2°F | Ht 64.0 in | Wt 166.0 lb

## 2021-04-09 DIAGNOSIS — M25551 Pain in right hip: Secondary | ICD-10-CM

## 2021-04-09 DIAGNOSIS — G894 Chronic pain syndrome: Secondary | ICD-10-CM | POA: Insufficient documentation

## 2021-04-09 DIAGNOSIS — M47818 Spondylosis without myelopathy or radiculopathy, sacral and sacrococcygeal region: Secondary | ICD-10-CM | POA: Diagnosis not present

## 2021-04-09 DIAGNOSIS — Q761 Klippel-Feil syndrome: Secondary | ICD-10-CM | POA: Insufficient documentation

## 2021-04-09 DIAGNOSIS — M533 Sacrococcygeal disorders, not elsewhere classified: Secondary | ICD-10-CM

## 2021-04-09 DIAGNOSIS — G5701 Lesion of sciatic nerve, right lower limb: Secondary | ICD-10-CM | POA: Insufficient documentation

## 2021-04-09 DIAGNOSIS — G8929 Other chronic pain: Secondary | ICD-10-CM | POA: Insufficient documentation

## 2021-04-09 NOTE — Patient Instructions (Addendum)
Preparing for your procedure (without sedation) Instructions: . Oral Intake: Do not eat or drink anything for at least 3 hours prior to your procedure. . Transportation: Unless otherwise stated by your physician, you may drive yourself after the procedure. . Blood Pressure Medicine: Take your blood pressure medicine with a sip of water the morning of the procedure. . Insulin: Take only  of your normal insulin dose. . Preventing infections: Shower with an antibacterial soap the morning of your procedure. . Build-up your immune system: Take 1000 mg of Vitamin C with every meal (3 times a day) the day prior to your procedure. . Pregnancy: If you are pregnant, call and cancel the procedure. . Sickness: If you have a cold, fever, or any active infections, call and cancel the procedure. . Arrival: You must be in the facility at least 30 minutes prior to your scheduled procedure. . Children: Do not bring any children with you. . Dress appropriately: Bring dark clothing that you would not mind if they get stained. . Valuables: Do not bring any jewelry or valuables. Procedure appointments are reserved for interventional treatments only. . No Prescription Refills. . No medication changes will be discussed during procedure appointments. . No disability issues will be discussed. Sacroiliac (SI) Joint Injection Patient Information  Description: The sacroiliac joint connects the scrum (very low back and tailbone) to the ilium (a pelvic bone which also forms half of the hip joint).  Normally this joint experiences very little motion.  When this joint becomes inflamed or unstable low back and or hip and pelvis pain may result.  Injection of this joint with local anesthetics (numbing medicines) and steroids can provide diagnostic information and reduce pain.  This injection is performed with the aid of x-ray guidance into the tailbone area while you are lying on your stomach.   You may experience an electrical  sensation down the leg while this is being done.  You may also experience numbness.  We also may ask if we are reproducing your normal pain during the injection.  Conditions which may be treated SI injection:  Low back, buttock, hip or leg pain  Preparation for the Injection:  Do not eat any solid food or dairy products within 8 hours of your appointment.  You may drink clear liquids up to 3 hours before appointment.  Clear liquids include water, black coffee, juice or soda.  No milk or cream please. You may take your regular medications, including pain medications with a sip of water before your appointment.  Diabetics should hold regular insulin (if take separately) and take 1/2 normal NPH dose the morning of the procedure.  Carry some sugar containing items with you to your appointment. A driver must accompany you and be prepared to drive you home after your procedure. Bring all of your current medications with you. An IV may be inserted and sedation may be given at the discretion of the physician. A blood pressure cuff, EKG and other monitors will often be applied during the procedure.  Some patients may need to have extra oxygen administered for a short period.  You will be asked to provide medical information, including your allergies, prior to the procedure.  We must know immediately if you are taking blood thinners (like Coumadin/Warfarin) or if you are allergic to IV iodine contrast (dye).  We must know if you could possible be pregnant.  Possible side effects:  Bleeding from needle site Infection (rare, may require surgery) Nerve injury (rare) Numbness &   tingling (temporary) A brief convulsion or seizure Light-headedness (temporary) Pain at injection site (several days) Decreased blood pressure (temporary) Weakness in the leg (temporary)   Call if you experience:  New onset weakness or numbness of an extremity below the injection site that last more than 8 hours. Hives or  difficulty breathing ( go to the emergency room) Inflammation or drainage at the injection site Any new symptoms which are concerning to you  Please note:  Although the local anesthetic injected can often make your back/ hip/ buttock/ leg feel good for several hours after the injections, the pain will likely return.  It takes 3-7 days for steroids to work in the sacroiliac area.  You may not notice any pain relief for at least that one week.  If effective, we will often do a series of three injections spaced 3-6 weeks apart to maximally decrease your pain.  After the initial series, we generally will wait some months before a repeat injection of the same type.  If you have any questions, please call [phone] [facility]  .  

## 2021-04-09 NOTE — Progress Notes (Signed)
Patient: [name]  Service Category: E/M  Provider: Gillis Santa, MD  DOB: 1956-01-04  DOS: 04/09/2021  Referring Provider: Orland Mustard *  MRN: [id number]  Setting: Ambulatory outpatient  PCP: McLean-Scocuzza, Nino Glow, MD  Type: New Patient  Specialty: Interventional Pain Management    Location: Office  Delivery: Face-to-face     Primary Reason(s) for Visit: Encounter for initial evaluation of one or more chronic problems (new to examiner) potentially causing chronic pain, and posing a threat to normal musculoskeletal function. (Level of risk: High) CC: Back Pain (Low right) and Hip Pain (Low right)  HPI  [name] is a 65 y.o. year old, female patient, who comes for the first time to our practice referred by McLean-Scocuzza, Olivia [name] * for our initial evaluation of her chronic pain. 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 Today she comes in for evaluation of her Back Pain (Low right) and Hip Pain (Low right)  Pain Assessment: Location: Lower, Right Back Radiating: radiates into right hip and down the side of the right leg to knee Onset: More than a month ago Duration: Chronic pain Quality: Aching Severity: 9 /10 (subjective, self-reported pain score)  Effect on ADL: lying down makes it hurt  worse Timing: Constant Modifying  factors: nothing BP: 115/89  HR: 76  Onset and Duration: Present longer than 3 months Cause of pain: Unknown Severity: Getting worse, NAS-11 at its worse: 0/10, NAS-11 at its best: .2/10, NAS-11 now: 10/10, and NAS-11 on the average: 10/10 Timing: Night, After activity or exercise, and After a period of immobility Aggravating Factors: Bending, Climbing, Kneeling, Lifiting, Prolonged sitting, Prolonged standing, Squatting, Surgery made it worse, Twisting, Walking, Walking uphill, and Walking downhill Alleviating Factors:  no alleviating factors Associated Problems: Spasms, Pain that wakes patient up, and Pain that does not allow patient to sleep Quality of Pain: Aching, Deep, Pressure-like, and Uncomfortable Previous Examinations or Tests: Nerve block, X-rays, and Neurosurgical evaluation Previous Treatments: Epidural steroid injections  [name] is a pleasant 65 year old female who presents with a chief complaint of low back, right buttock right hip pain that also radiates into her right sacroiliac joint.  She also has occasional radiation down her right leg in a dermatomal fashion.  She recently moved from [place].  She is to have sacroiliac joint junctions done there on the right side which provided pain relief for many months.  She has had 2 right SI joint injections done with Dr. Alba Destine with [facility] with limited response.  She is interested in repeating an SI joint injection.  It also sounds like she has had a piriformis injection done in this region as well.  We will try and obtain records from her pain clinic in [place] to better understand what exactly was done.  We did discuss SI joint exercises as well as piriformis release exercises.  Of note she has done physical therapy in the past for knee work which made her pain worse.  She is not interested in  medication therapy.   Meds   Current Outpatient Medications:    amLODipine (NORVASC) 2.5 MG tablet, Take  2.5 mg by mouth daily., Disp: , Rfl:    Ascorbic Acid (VITAMIN C PO), Take 1,000 mg by mouth daily., Disp: , Rfl:    CALCIUM CITRATE PO, Take 600 mg by mouth daily., Disp: , Rfl:    Cyanocobalamin (B-12 PO), Take 1 tablet by mouth daily., Disp: , Rfl:    cyclobenzaprine (FLEXERIL) 5 MG tablet, Take 1 tablet (5 mg total) by mouth at bedtime as needed for muscle spasms., Disp: 30 tablet, Rfl: 2   famotidine (PEPCID) 20 MG tablet, Take 20 mg by mouth daily., Disp: , Rfl:    levothyroxine (SYNTHROID) 175 MCG tablet, Take 1 tablet (175 mcg total) by mouth daily before breakfast. 30 min before food, Disp: 90 tablet, Rfl: 3   MELATONIN PO, Take 1 tablet by mouth daily., Disp: , Rfl:    nortriptyline (PAMELOR) 25 MG capsule, Take 1 capsule (25 mg total) by mouth at bedtime., Disp: 90 capsule, Rfl: 2   omeprazole (PRILOSEC) 40 MG capsule, Take 1 capsule (40 mg total) by mouth in the morning and at bedtime., Disp: 60 capsule, Rfl: 2   Probiotic Product (PROBIOTIC DAILY PO), Take by mouth., Disp: , Rfl:    sucralfate (CARAFATE) 1 g tablet, Take 1 tablet (1 g total) by mouth 4 (four) times daily., Disp: 120 tablet, Rfl: 2   TURMERIC-GINGER PO, Take 1 each by mouth daily., Disp: , Rfl:    VITAMIN D, CHOLECALCIFEROL, PO, Take 5,000 mg by mouth daily at 12 noon., Disp: , Rfl:    meloxicam (MOBIC) 7.5 MG tablet, Take 7.5 mg by mouth daily. (Patient not taking: Reported on 04/09/2021), Disp: , Rfl:    Multiple Vitamins-Calcium (ONE-A-DAY WOMENS FORMULA PO), Take 1 capsule by mouth daily. (Patient not taking: Reported on 04/09/2021), Disp: , Rfl:   Imaging Review   Lumbosacral Imaging: Lumbar MR wo contrast: Results for orders placed during the hospital encounter of 11/09/19  MR Lumbar Spine Wo Contrast  Narrative CLINICAL DATA:  Low back pain. Right hip and leg pain. History of surgery in 2017.  EXAM: MRI LUMBAR SPINE WITHOUT CONTRAST  TECHNIQUE: Multiplanar, multisequence MR imaging of the lumbar spine  was performed. No intravenous contrast was administered.  COMPARISON:  None.  FINDINGS: Segmentation:  Standard lumbar numbering  Alignment:  Grade 1 anterolisthesis at T11-12, L4-5 and L5-S1  Vertebrae:  No fracture, evidence of discitis, or bone lesion.  Conus medullaris and cauda equina: Conus extends to the T12-L1 level. Conus and cauda equina appear normal.  Paraspinal and other soft tissues: Negative  Disc levels:  T11-12: Mild facet spurring and anterolisthesis. Mild disc bulging without impingement.  T12- L1: Unremarkable.  L1-L2: Mild disc narrowing and bulging  L2-L3: Unremarkable.  L3-L4: Facet degeneration with spurring and anterolisthesis. Spurring asymmetrically affects the right facet. The disc is bulging with a small left foraminal protrusion that is noncompressive but does cause mild left foraminal narrowing  L4-L5: Facet spurring and mild anterolisthesis. Mild disc bulging. No impingement  L5-S1:Degenerative facet spurring with mild anterolisthesis. On the left there is a 4 mm synovial cyst. No compressive stenosis.  IMPRESSION: 1. Multilevel facet degeneration and mild listhesis. Generalized mild disc bulging. 2. The spinal canal is diffusely patent. 3. Diffusely patent foramina, including on the left at L5-S1 where there is a 4 mm synovial cyst at the posterior foramen.   Electronically Signed By: Monte Fantasia  M.D. On: 11/10/2019 06:01  DG Hip Unilat W OR W/O Pelvis 2-3 Views Right  Narrative CLINICAL DATA:  65 year old female with fall and right hip pain.  EXAM: DG HIP (WITH OR WITHOUT PELVIS) 2-3V RIGHT  COMPARISON:  Right hip radiograph dated 11/09/2019.  FINDINGS: There is no acute fracture or dislocation. Mild osteopenia. The soft tissues are unremarkable.  IMPRESSION: Negative.   Electronically Signed By: Anner Crete M.D. On: 01/20/2021 14:01   Complexity Note: Imaging results reviewed. Results shared with Ms.  [name], using Layman's terms.                         ROS  Cardiovascular: High blood pressure Pulmonary or Respiratory: Shortness of breath Neurological: No reported neurological signs or symptoms such as seizures, abnormal skin sensations, urinary and/or fecal incontinence, being born with an abnormal open spine and/or a tethered spinal cord Psychological-Psychiatric: Depressed Gastrointestinal: Reflux or heatburn Genitourinary: No reported renal or genitourinary signs or symptoms such as difficulty voiding or producing urine, peeing blood, non-functioning kidney, kidney stones, difficulty emptying the bladder, difficulty controlling the flow of urine, or chronic kidney disease Hematological: No reported hematological signs or symptoms such as prolonged bleeding, low or poor functioning platelets, bruising or bleeding easily, hereditary bleeding problems, low energy levels due to low hemoglobin or being anemic Endocrine: High thyroid Rheumatologic: No reported rheumatological signs and symptoms such as fatigue, joint pain, tenderness, swelling, redness, heat, stiffness, decreased range of motion, with or without associated rash Musculoskeletal: Negative for myasthenia gravis, muscular dystrophy, multiple sclerosis or malignant hyperthermia Work History: Disabled  Allergies  [name] has No Known Allergies.  Laboratory Chemistry Profile   Renal Lab Results  Component Value Date   BUN 20 04/02/2021   CREATININE 0.66 04/02/2021   GFR 93.55 04/02/2021   GFRAA >60 10/04/2019   GFRNONAA >60 08/24/2020   SPECGRAV      >=1.030 (A) 10/30/2020   PHUR 8.0 (H) 10/30/2020   PROTEINUR Trace 10/30/2020     Electrolytes Lab Results  Component Value Date   NA 139 04/02/2021   K 4.2 04/02/2021   CL 103 04/02/2021   CALCIUM 10.0 04/02/2021     Hepatic Lab Results  Component Value Date   AST 22 04/02/2021   ALT 21 04/02/2021   ALBUMIN 4.6 04/02/2021   ALKPHOS 81 04/02/2021      ID Lab Results  Component Value Date   HIV Non Reactive 10/30/2020   SARSCOV2NAA NEGATIVE 12/19/2020     Bone Lab Results  Component Value Date   VD25OH 71.21 04/02/2021     Endocrine Lab Results  Component Value Date   GLUCOSE 90 04/02/2021   GLUCOSEU Negative 10/30/2020   HGBA1C 5.6 04/02/2021   TSH 3.30 04/02/2021     Neuropathy Lab Results  Component Value Date   HGBA1C 5.6 04/02/2021   HIV Non Reactive 10/30/2020     CNS No results found for: COLORCSF, APPEARCSF, RBCCOUNTCSF, WBCCSF, POLYSCSF, LYMPHSCSF, EOSCSF, PROTEINCSF, GLUCCSF, JCVIRUS, CSFOLI, IGGCSF, LABACHR, ACETBL, LABACHR, ACETBL   Inflammation (CRP: Acute  ESR: Chronic) No results found for: CRP, ESRSEDRATE, LATICACIDVEN   Rheumatology No results found for: RF, ANA, LABURIC, URICUR, LYMEIGGIGMAB, LYMEABIGMQN, HLAB27   Coagulation Lab Results  Component Value Date   PLT 207.0 04/02/2021     Cardiovascular Lab Results  Component Value Date   HGB 12.8 04/02/2021   HCT 37.7 04/02/2021     Screening Lab Results  Component Value Date  [place] NEGATIVE 12/19/2020   HIV Non Reactive 10/30/2020     Cancer No results found for: CEA, CA125, LABCA2   Allergens No results found for: ALMOND, APPLE, ASPARAGUS, AVOCADO, BANANA, BARLEY, BASIL, BAYLEAF, GREENBEAN, LIMABEAN, WHITEBEAN, BEEFIGE, REDBEET, BLUEBERRY, BROCCOLI, CABBAGE, MELON, CARROT, CASEIN, CASHEWNUT, CAULIFLOWER, CELERY     Note: Lab results reviewed.  [place]  Drug: [name]  reports no history of drug use. Alcohol:  reports previous alcohol use. Tobacco:  reports that she has never smoked. She has never used smokeless tobacco. Medical:  has a past medical history of COVID-19 (07/08/2019), Depression, Hypothyroidism, PTSD (post-traumatic stress disorder), and Wears glasses. Family: family history includes Cancer in her father and sister; Dementia in her mother; Diabetes in her father; Diabetes Mellitus I in her son; Heart disease in  her mother, sister, and sister; Hypertension in her mother; Lung cancer in her maternal uncle; Osteoporosis in her mother; Ovarian cancer in her sister; Sickle cell trait in her daughter; Skin cancer in an other family member; Vitiligo in her mother and son.  Past Surgical History:  Procedure Laterality Date   49 HOUR [place] STUDY N/A 03/04/2021   Procedure: 24 HOUR PH STUDY;  Surgeon: Mauri Pole, MD;  Location: [facility];  Service: Endoscopy;  Laterality: N/A;   AUGMENTATION MAMMAPLASTY Bilateral 2006   BACK SURGERY     cervical spine fusion in 2017 in [place] N/A 03/04/2021   Procedure: ESOPHAGEAL MANOMETRY (EM);  Surgeon: Mauri Pole, MD;  Location: [facility];  Service: Endoscopy;  Laterality: N/A;   ESOPHAGOGASTRODUODENOSCOPY (EGD) WITH PROPOFOL N/A 10/24/2020   Procedure: ESOPHAGOGASTRODUODENOSCOPY (EGD) WITH PROPOFOL;  Surgeon: Jonathon Bellows, MD;  Location: [facility];  Service: Gastroenterology;  Laterality: N/A;  COVID POSITIVE 10/22/2020   ESOPHAGOGASTRODUODENOSCOPY (EGD) WITH PROPOFOL N/A 12/20/2020   Procedure: ESOPHAGOGASTRODUODENOSCOPY (EGD) WITH PROPOFOL;  Surgeon: Jonathon Bellows, MD;  Location: [facility] ENDOSCOPY;  Service: Gastroenterology;  Laterality: N/A;  COVID POSITIVE 09/24/2020   [place] IMPEDANCE STUDY N/A 03/04/2021   Procedure: [place] IMPEDANCE STUDY;  Surgeon: Mauri Pole, MD;  Location: [facility];  Service: Endoscopy;  Laterality: N/A;   Active Ambulatory Problems    Diagnosis Date Noted   Hypothyroidism    H/O bilateral breast implants 10/28/2019   Chronic midline low back pain with right-sided sciatica 10/28/2019   Depression, recurrent ([place])    Essential hypertension 10/28/2019   Breast pain, left 10/28/2019   Right hip pain 10/28/2019   Osteoporosis 10/28/2019   Shortness of breath 10/28/2019   S/P lumbar fusion 10/28/2019   History of colon polyps 10/28/2019   Dysphagia 01/25/2020   Anxiety  06/15/2020   Overactive bladder 06/15/2020   Bronchospasm 06/15/2020   Allergic rhinitis 06/15/2020   Trochanteric bursitis of right hip 08/22/2020   Brain fog 09/28/2020   Insomnia 10/30/2020   Mild persistent asthma 10/30/2020   Esophagitis 10/30/2020   Esophageal candidiasis ([place]) 12/19/2020   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 In no apparent acute distress Vitals:   04/09/21 0944  BP: 115/89  Pulse: 76  Temp: (!) 97.2 F (36.2 C)  SpO2: 100%  Weight: 166 lb (75.3 kg)  Height: 5' 4" (1.626 m)   BMI Assessment: Estimated body mass index is 28.49 kg/m as calculated from the following:   Height as of this encounter: 5' 4" (1.626 m).   Weight as of this encounter: 166 lb (75.3 kg).  BMI interpretation table: BMI level Category Range association with higher incidence of chronic pain  <18 kg/m2 Underweight   18.5-24.9 kg/m2 Ideal body weight   25-29.9 kg/m2 Overweight Increased incidence by 20%  30-34.9 kg/m2 Obese (Class I) Increased incidence by 68%  35-39.9 kg/m2 Severe obesity (Class II) Increased incidence by 136%  >40 kg/m2 Extreme obesity (Class III) Increased incidence by 254%   Patient's current BMI Ideal Body weight  Body mass index is 28.49 kg/m. Ideal body weight: 54.7 kg (120 lb 9.5 oz) Adjusted ideal body weight: 62.9 kg (138 lb 12.1 oz)   BMI Readings from Last 4 Encounters:  04/09/21  28.49 kg/m  04/02/21 27.72 kg/m  01/17/21 27.52 kg/m  12/28/20 27.92 kg/m   Wt Readings from Last 4 Encounters:  04/09/21 166 lb (75.3 kg)  04/02/21 166 lb 9.6 oz (75.6 kg)  01/17/21 165 lb 6.4 oz (75 kg)  12/28/20 167 lb 12.8 oz (76.1 kg)    Psych/Mental status: Alert, oriented x 3 (person, place, & time)       Eyes: PERLA Respiratory: No evidence of acute respiratory distress  Lumbar Spine Area Exam  Skin & Axial Inspection: No masses, redness, or swelling Alignment: Symmetrical Functional ROM: Unrestricted ROM       Stability: No instability detected Muscle Tone/Strength: Functionally intact. No obvious neuro-muscular anomalies detected. Sensory (Neurological): Musculoskeletal pain pattern Palpation: No palpable anomalies       Provocative Tests: Hyperextension/rotation test: deferred today       Lumbar quadrant test (Kemp's test): deferred today       Lateral bending test: deferred today       Patrick's Maneuver: (+) for right-sided S-I arthralgia             FABER* test: (+) for right-sided S-I arthralgia             S-I anterior distraction/compression test: (+)   S-I arthralgia/arthropathy S-I lateral compression test: Positive for   S-I arthralgia/arthropathy S-I Thigh-thrust test: deferred today         S-I Gaenslen's test: deferred today         *(Flexion, ABduction and External Rotation) Gait & Posture Assessment  Ambulation: Unassisted Gait: Relatively normal for age and body habitus Posture: WNL  Lower Extremity Exam    Side: Right lower extremity  Side: Left lower extremity  Stability: No instability observed          Stability: No instability observed          Skin & Extremity Inspection: Skin color, temperature, and hair growth are WNL. No peripheral edema or cyanosis. No masses, redness, swelling, asymmetry, or associated skin lesions. No contractures.  Skin & Extremity Inspection: Skin color, temperature, and hair growth are WNL. No peripheral edema or  cyanosis. No masses, redness, swelling, asymmetry, or associated skin lesions. No contractures.  Functional ROM: Unrestricted ROM                  Functional  ROM: Unrestricted ROM                  Muscle Tone/Strength: Functionally intact. No obvious neuro-muscular anomalies detected.  Muscle Tone/Strength: Functionally intact. No obvious neuro-muscular anomalies detected.  Sensory (Neurological): Unimpaired        Sensory (Neurological): Unimpaired        DTR: Patellar: deferred today Achilles: deferred today Plantar: deferred today  DTR: Patellar: deferred today Achilles: deferred today Plantar: deferred today  Palpation: No palpable anomalies  Palpation: No palpable anomalies    Assessment  Primary Diagnosis & Pertinent Problem List: The primary encounter diagnosis was Chronic right SI joint pain. Diagnoses of SI joint arthritis, Right hip pain, Piriformis syndrome, right, Cervical fusion syndrome, and Chronic pain syndrome were also pertinent to this visit.  Visit Diagnosis (New problems to examiner): 1. Chronic right SI joint pain   2. SI joint arthritis   3. Right hip pain   4. Piriformis syndrome, right   5. Cervical fusion syndrome   6. Chronic pain syndrome    Plan of Care (Initial workup plan)    Recommend therapeutic right sacroiliac joint injection as well as right piriformis trigger point injection for SI joint related pain as well as right piriformis syndrome.  She has had these done in [place] in the past that provided her with pain relief and functional improvement.  Patient will try and obtain records regarding her procedural history at her pain clinic in [place] and will try to have that at her next visit.  Procedure Orders  SACROILIAC JOINT INJECTION  TRIGGER POINT INJECTION   Orders Placed This Encounter  Procedures   SACROILIAC JOINT INJECTION    Standing Status:   Future    Standing Expiration Date:   05/10/2021    Scheduling Instructions:     Side: RIGHT      Sedation: Patient's choice.     Timeframe: ASAP    Order Specific Question:   Where will this procedure be performed?    Answer:   [facility]   TRIGGER POINT INJECTION    Area: Buttocks region (gluteal area) Indications: Piriformis muscle pain; Right (G57.01) piriformis-syndrome; piriformis muscle spasms (M84.132). CPT code: 20552    Scheduling Instructions:     Type: Myoneural block (TPI) of piriformis muscle.     Side:  RIGHT     Sedation: Patient's choice.     Timeframe: Today    Order Specific Question:   Where will this procedure be performed?    Answer:   [facility]      Provider-requested follow-up: Return in about 1 week (around 04/16/2021) for R SI-J + R Piriformis , without sedation.  Future Appointments  Date Time Provider [place]  04/17/2021 10:40 AM Gillis Santa, MD [facility] None  06/03/2021  1:45 PM [facility] PHARMACIST [facility]    Note by: Gillis Santa, MD Date: 04/09/2021; Time: 12:39 PM

## 2021-04-16 NOTE — Telephone Encounter (Signed)
Insurance refusing evenitity can we try prolia.

## 2021-04-16 NOTE — Telephone Encounter (Signed)
Are they saying evenity not covered ? If not refer endocrine

## 2021-04-16 NOTE — Telephone Encounter (Signed)
Please advise 

## 2021-04-16 NOTE — Telephone Encounter (Signed)
Patient is stating medication is not covered and something new will need to be sent in

## 2021-04-16 NOTE — Telephone Encounter (Signed)
Patient needs Dr French Ana to fine another type of medication for issue below. She does not have insurance.

## 2021-04-17 ENCOUNTER — Other Ambulatory Visit: Payer: Self-pay

## 2021-04-17 ENCOUNTER — Encounter: Payer: Self-pay | Admitting: Student in an Organized Health Care Education/Training Program

## 2021-04-17 ENCOUNTER — Ambulatory Visit
Admission: RE | Admit: 2021-04-17 | Discharge: 2021-04-17 | Disposition: A | Discharge status: Home / Self Care | Payer: Medicare Other | Source: Ambulatory Visit | Attending: Student in an Organized Health Care Education/Training Program | Admitting: Student in an Organized Health Care Education/Training Program

## 2021-04-17 ENCOUNTER — Ambulatory Visit (HOSPITAL_BASED_OUTPATIENT_CLINIC_OR_DEPARTMENT_OTHER): Payer: Medicare Other | Admitting: Student in an Organized Health Care Education/Training Program

## 2021-04-17 VITALS — BP 133/94 | HR 92 | Temp 96.8°F | Resp 20 | Ht 64.5 in | Wt 165.0 lb

## 2021-04-17 DIAGNOSIS — G5701 Lesion of sciatic nerve, right lower limb: Secondary | ICD-10-CM | POA: Diagnosis not present

## 2021-04-17 DIAGNOSIS — M533 Sacrococcygeal disorders, not elsewhere classified: Secondary | ICD-10-CM | POA: Diagnosis not present

## 2021-04-17 DIAGNOSIS — G894 Chronic pain syndrome: Secondary | ICD-10-CM | POA: Insufficient documentation

## 2021-04-17 DIAGNOSIS — M25559 Pain in unspecified hip: Secondary | ICD-10-CM | POA: Insufficient documentation

## 2021-04-17 DIAGNOSIS — G8929 Other chronic pain: Secondary | ICD-10-CM | POA: Diagnosis not present

## 2021-04-17 DIAGNOSIS — M47818 Spondylosis without myelopathy or radiculopathy, sacral and sacrococcygeal region: Secondary | ICD-10-CM

## 2021-04-17 MED ORDER — ROPIVACAINE HCL 2 MG/ML IJ SOLN
INTRAMUSCULAR | Status: AC
  Filled 2021-04-17: qty 20

## 2021-04-17 MED ORDER — METHYLPREDNISOLONE ACETATE 40 MG/ML IJ SUSP
40.0000 mg | Freq: Once | INTRAMUSCULAR | Status: AC
  Administered 2021-04-17: 40 mg via INTRA_ARTICULAR

## 2021-04-17 MED ORDER — LIDOCAINE HCL (PF) 2 % IJ SOLN
INTRAMUSCULAR | Status: AC
  Filled 2021-04-17: qty 10

## 2021-04-17 MED ORDER — IOHEXOL 180 MG/ML  SOLN
10.0000 mL | Freq: Once | INTRAMUSCULAR | Status: AC
  Administered 2021-04-17: 5 mL via INTRA_ARTICULAR
  Filled 2021-04-17: qty 20

## 2021-04-17 MED ORDER — DEXAMETHASONE SODIUM PHOSPHATE 10 MG/ML IJ SOLN
INTRAMUSCULAR | Status: AC
  Filled 2021-04-17: qty 1

## 2021-04-17 MED ORDER — ROPIVACAINE HCL 2 MG/ML IJ SOLN
9.0000 mL | Freq: Once | INTRAMUSCULAR | Status: AC
  Administered 2021-04-17: 20 mL via PERINEURAL

## 2021-04-17 MED ORDER — LIDOCAINE HCL 2 % IJ SOLN
20.0000 mL | Freq: Once | INTRAMUSCULAR | Status: AC
  Administered 2021-04-17: 100 mg

## 2021-04-17 MED ORDER — DEXAMETHASONE SODIUM PHOSPHATE 10 MG/ML IJ SOLN
10.0000 mg | Freq: Once | INTRAMUSCULAR | Status: AC
  Administered 2021-04-17: 10 mg

## 2021-04-17 MED ORDER — METHYLPREDNISOLONE ACETATE 40 MG/ML IJ SUSP
INTRAMUSCULAR | Status: AC
  Filled 2021-04-17: qty 1

## 2021-04-17 NOTE — Progress Notes (Signed)
PROVIDER NOTE: Information contained herein reflects review and annotations entered in association with encounter. Interpretation of such information and data should be left to medically-trained personnel. Information provided to patient can be located elsewhere in the medical record under "Patient Instructions". Document created using STT-dictation technology, any transcriptional errors that may result from process are unintentional.    Patient: [name]  Service Category: Procedure  Provider: Edward JollyBilal Littleton Haub, MD  DOB: 1956/07/19  DOS: 04/17/2021  Location: [facility]  MRN: [id number]  Setting: Ambulatory - outpatient  Referring Provider: McLean-Scocuzza, French Anaracy *  Type: Established Patient  Specialty: Interventional Pain Management  PCP: McLean-Scocuzza, Pasty Spillersracy N, MD   Primary Reason for Visit: Interventional Pain Management Treatment. CC: SI joint  and Hip Pain  Procedure:          Anesthesia, Analgesia, Anxiolysis:  Type: Diagnostic Sacroiliac Joint Steroid Injection          & Right Piriformis Trigger point injection  under fluoroscopy  Region: Inferior Lumbosacral Region Level: PIIS (Posterior Inferior Iliac Spine) Laterality: Right-Side  Type: Local Anesthesia  Local Anesthetic: Lidocaine 1-2%  Position: Prone           Indications: 1. Chronic right SI joint pain   2. SI joint arthritis   3. Piriformis syndrome, right   4. Chronic pain syndrome    Pain Score: Pre-procedure: 9 /10 Post-procedure: 0-No pain/10   Pre-op H&P Assessment:  [name] is a 65 y.o. (year old), female patient, seen today for interventional treatment. She  has a past surgical history that includes Back surgery; Breast surgery; Augmentation mammaplasty (Bilateral, 2006); Esophagogastroduodenoscopy (egd) with propofol (N/A, 10/24/2020); Esophagogastroduodenoscopy (egd) with propofol (N/A, 12/20/2020); Esophageal manometry (N/A, 03/04/2021); 24 hour ph study (N/A, 03/04/2021); and PH impedance  study (N/A, 03/04/2021). [name] has a current medication list which includes the following prescription(s): amlodipine, ascorbic acid, calcium citrate, cyanocobalamin, cyclobenzaprine, famotidine, levothyroxine, multiple vitamins-calcium, nortriptyline, probiotic product, sucralfate, turmeric-ginger, cholecalciferol, melatonin, meloxicam, omeprazole, and [DISCONTINUED] trazodone. Her primarily concern today is the SI joint  and Hip Pain  Initial Vital Signs:  Pulse/HCG Rate: 92ECG Heart Rate: 79 Temp: (!) 96.8 F (36 C) Resp: 18 BP: 114/89 SpO2: 97 %  BMI: Estimated body mass index is 27.88 kg/m as calculated from the following:   Height as of this encounter: 5' 4.5" (1.638 m).   Weight as of this encounter: 165 lb (74.8 kg).  Risk Assessment: Allergies: Reviewed. She has No Known Allergies.  Allergy Precautions: None required Coagulopathies: Reviewed. None identified.  Blood-thinner therapy: None at this time Active Infection(s): Reviewed. None identified. [name] is afebrile  Site Confirmation: [name] was asked to confirm the procedure and laterality before marking the site Procedure checklist: Completed Consent: Before the procedure and under the influence of no sedative(s), amnesic(s), or anxiolytics, the patient was informed of the treatment options, risks and possible complications. To fulfill our ethical and legal obligations, as recommended by the American Medical Association's Code of Ethics, I have informed the patient of my clinical impression; the nature and purpose of the treatment or procedure; the risks, benefits, and possible complications of the intervention; the alternatives, including doing nothing; the risk(s) and benefit(s) of the alternative treatment(s) or procedure(s); and the risk(s) and benefit(s) of doing nothing. The patient was provided information about the general risks and possible complications associated with the procedure. These may include, but  are not limited to: failure to achieve desired goals, infection, bleeding, organ or nerve damage, allergic reactions,  paralysis, and death. In addition, the patient was informed of those risks and complications associated to the procedure, such as failure to decrease pain; infection; bleeding; organ or nerve damage with subsequent damage to sensory, motor, and/or autonomic systems, resulting in permanent pain, numbness, and/or weakness of one or several areas of the body; allergic reactions; (i.e.: anaphylactic reaction); and/or death. Furthermore, the patient was informed of those risks and complications associated with the medications. These include, but are not limited to: allergic reactions (i.e.: anaphylactic or anaphylactoid reaction(s)); adrenal axis suppression; blood sugar elevation that in diabetics may result in ketoacidosis or comma; water retention that in patients with history of congestive heart failure may result in shortness of breath, pulmonary edema, and decompensation with resultant heart failure; weight gain; swelling or edema; medication-induced neural toxicity; particulate matter embolism and blood vessel occlusion with resultant organ, and/or nervous system infarction; and/or aseptic necrosis of one or more joints. Finally, the patient was informed that Medicine is not an exact science; therefore, there is also the possibility of unforeseen or unpredictable risks and/or possible complications that may result in a catastrophic outcome. The patient indicated having understood very clearly. We have given the patient no guarantees and we have made no promises. Enough time was given to the patient to ask questions, all of which were answered to the patient's satisfaction. Ms. Drenning has indicated that she wanted to continue with the procedure. Attestation: I, the ordering provider, attest that I have discussed with the patient the benefits, risks, side-effects, alternatives, likelihood of  achieving goals, and potential problems during recovery for the procedure that I have provided informed consent. Date  Time: 04/17/2021 10:15 AM  Pre-Procedure Preparation:  Monitoring: As per clinic protocol. Respiration, ETCO2, SpO2, BP, heart rate and rhythm monitor placed and checked for adequate function Safety Precautions: Patient was assessed for positional comfort and pressure points before starting the procedure. Time-out: I initiated and conducted the "Time-out" before starting the procedure, as per protocol. The patient was asked to participate by confirming the accuracy of the "Time Out" information. Verification of the correct person, site, and procedure were performed and confirmed by me, the nursing staff, and the patient. "Time-out" conducted as per Joint Commission's Universal Protocol (UP.01.01.01). Time: 1110  Description of Procedure:          Target Area: Superior, posterior, aspect of the sacroiliac fissure Approach: Posterior, paraspinal, ipsilateral approach. Area Prepped: Entire Lower Lumbosacral Region DuraPrep (Iodine Povacrylex [0.7% available iodine] and Isopropyl Alcohol, 74% w/w) Safety Precautions: Aspiration looking for blood return was conducted prior to all injections. At no point did we inject any substances, as a needle was being advanced. No attempts were made at seeking any paresthesias. Safe injection practices and needle disposal techniques used. Medications properly checked for expiration dates. SDV (single dose vial) medications used. Description of the Procedure: Protocol guidelines were followed. The patient was placed in position over the procedure table. The target area was identified and the area prepped in the usual manner. Skin & deeper tissues infiltrated with local anesthetic. Appropriate amount of time allowed to pass for local anesthetics to take effect. The procedure needle was advanced under fluoroscopic guidance into the sacroiliac joint until a  firm endpoint was obtained. Proper needle placement secured. Negative aspiration confirmed. Solution injected in intermittent fashion, asking for systemic symptoms every 0.5cc of injectate. The needles were then removed and the area cleansed, making sure to leave some of the prepping solution back to take advantage of its long  term bactericidal properties. Vitals:   04/17/21 1023 04/17/21 1110 04/17/21 1114 04/17/21 1118  BP: 114/89 (!) 140/96 135/87 (!) 133/94  Pulse: 92     Resp: 18 20 20 20   Temp: (!) 96.8 F (36 C)     TempSrc: Temporal     SpO2: 97% 100% 100% 100%  Weight: 165 lb (74.8 kg)     Height: 5' 4.5" (1.638 m)       Start Time: 1110 hrs. End Time: 1117 hrs. Materials:  Needle(s) Type: Spinal Needle Gauge: 25G Length: 3.5-in Medication(s): Please see orders for medications and dosing details. 5 cc solution made of 4 cc of 0.2% ropivacaine, 1 cc of methylprednisolone, 40 mg/cc.  Injected into the right SI joint after contrast confirmation.  A right piriformis trigger point injection was also performed under fluoroscopy.  1 cm deep, 1 cm inferior and 1 cm lateral to the inferior SI joint fissure, the piriformis muscle was injected with contrast, muscle striations confirmed, no pain or paresthesias down right lower extremity.  5 cc solution made of 4 cc of 0.2% ropivacaine, 1 cc of Decadron:,10 mg/cc was injected into the right piriformis muscle.  Imaging Guidance (Non-Spinal):          Type of Imaging Technique: Fluoroscopy Guidance (Non-Spinal) Indication(s): Assistance in needle guidance and placement for procedures requiring needle placement in or near specific anatomical locations not easily accessible without such assistance. Exposure Time: Please see nurses notes. Contrast: Before injecting any contrast, we confirmed that the patient did not have an allergy to iodine, shellfish, or radiological contrast. Once satisfactory needle placement was completed at the desired  level, radiological contrast was injected. Contrast injected under live fluoroscopy. No contrast complications. See chart for type and volume of contrast used. Fluoroscopic Guidance: I was personally present during the use of fluoroscopy. "Tunnel Vision Technique" used to obtain the best possible view of the target area. Parallax error corrected before commencing the procedure. "Direction-depth-direction" technique used to introduce the needle under continuous pulsed fluoroscopy. Once target was reached, antero-posterior, oblique, and lateral fluoroscopic projection used confirm needle placement in all planes. Images permanently stored in EMR. Interpretation: I personally interpreted the imaging intraoperatively. Adequate needle placement confirmed in multiple planes. Appropriate spread of contrast into desired area was observed. No evidence of afferent or efferent intravascular uptake. Permanent images saved into the patient's record.   Post-operative Assessment:  Post-procedure Vital Signs:  Pulse/HCG Rate: 9281 Temp: (!) 96.8 F (36 C) Resp: 20 BP: (!) 133/94 SpO2: 100 %  EBL: None  Complications: No immediate post-treatment complications observed by team, or reported by patient.  Note: The patient tolerated the entire procedure well. A repeat set of vitals were taken after the procedure and the patient was kept under observation following institutional policy, for this type of procedure. Post-procedural neurological assessment was performed, showing return to baseline, prior to discharge. The patient was provided with post-procedure discharge instructions, including a section on how to identify potential problems. Should any problems arise concerning this procedure, the patient was given instructions to immediately contact , at any time, without hesitation. In any case, we plan to contact the patient by telephone for a follow-up status report regarding this interventional  procedure.  Comments:  No additional relevant information.  Plan of Care  Orders:  Orders Placed This Encounter  Procedures   DG PAIN CLINIC C-ARM 1-60 MIN NO REPORT    Intraoperative interpretation by procedural physician at [facility].    Standing Status:  Standing    Number of Occurrences:   1    Order Specific Question:   Reason for exam:    Answer:   Assistance in needle guidance and placement for procedures requiring needle placement in or near specific anatomical locations not easily accessible without such assistance.     Medications ordered for procedure: Meds ordered this encounter  Medications   iohexol (OMNIPAQUE) 180 MG/ML injection 10 mL    Must be Myelogram-compatible. If not available, you may substitute with a water-soluble, non-ionic, hypoallergenic, myelogram-compatible radiological contrast medium.   lidocaine (XYLOCAINE) 2 % (with pres) injection 400 mg   ropivacaine (PF) 2 mg/mL (0.2%) (NAROPIN) injection 9 mL   dexamethasone (DECADRON) injection 10 mg   methylPREDNISolone acetate (DEPO-MEDROL) injection 40 mg   Medications administered: We administered iohexol, lidocaine, ropivacaine (PF) 2 mg/mL (0.2%), dexamethasone, and methylPREDNISolone acetate.  See the medical record for exact dosing, route, and time of administration.  Follow-up plan:   Return in about 4 weeks (around 05/15/2021) for Post Procedure Evaluation, virtual.     Right SI-J and Right Piriformis 04/17/21    Recent Visits Date Type Provider Dept  04/09/21 Office Visit Edward Jolly, MD [facility]  Showing recent visits within past 90 days and meeting all other requirements Today's Visits Date Type Provider Dept  04/17/21 Procedure visit Edward Jolly, MD [facility]  Showing today's visits and meeting all other requirements Future Appointments Date Type Provider Dept  05/15/21 Appointment Edward Jolly, MD [facility]  Showing future  appointments within next 90 days and meeting all other requirements Disposition: Discharge home  Discharge (Date  Time): 04/17/2021; 1125 hrs.   Primary Care Physician: McLean-Scocuzza, Pasty Spillers, MD Location: [facility] Note by: Edward Jolly, MD Date: 04/17/2021; Time: 11:41 AM  Disclaimer:  Medicine is not an exact science. The only guarantee in medicine is that nothing is guaranteed. It is important to note that the decision to proceed with this intervention was based on the information collected from the patient. The Data and conclusions were drawn from the patient's questionnaire, the interview, and the physical examination. Because the information was provided in large part by the patient, it cannot be guaranteed that it has not been purposely or unconsciously manipulated. Every effort has been made to obtain as much relevant data as possible for this evaluation. It is important to note that the conclusions that lead to this procedure are derived in large part from the available data. Always take into account that the treatment will also be dependent on availability of resources and existing treatment guidelines, considered by other Pain Management Practitioners as being common knowledge and practice, at the time of the intervention. For Medico-Legal purposes, it is also important to point out that variation in procedural techniques and pharmacological choices are the acceptable norm. The indications, contraindications, technique, and results of the above procedure should only be interpreted and judged by a Board-Certified Interventional Pain Specialist with extensive familiarity and expertise in the same exact procedure and technique.

## 2021-04-17 NOTE — Progress Notes (Signed)
Safety precautions to be maintained throughout the outpatient stay will include: orient to surroundings, keep bed in low position, maintain call bell within reach at all times, provide assistance with transfer out of bed and ambulation.  

## 2021-04-17 NOTE — Patient Instructions (Signed)

## 2021-04-18 ENCOUNTER — Telehealth: Payer: Self-pay | Admitting: *Deleted

## 2021-04-18 NOTE — Addendum Note (Signed)
Addended by: Quentin Ore on: 04/18/2021 05:31 PM   Modules accepted: Orders

## 2021-04-18 NOTE — Telephone Encounter (Signed)
Denies any post procedure issues. 

## 2021-04-19 NOTE — Telephone Encounter (Signed)
Patient informed that medication denied and Dr French Ana McLean-Scocuzza has placed a referral to Endocrinology. States she is agreeable to keep referral and will await their call.

## 2021-04-19 NOTE — Telephone Encounter (Signed)
Pt called and wants a call back about this. She states that she has been waiting too long. Please advise

## 2021-04-22 NOTE — Telephone Encounter (Signed)
April 19, 2021  Me      4:01 PM Note Patient informed that medication denied and Dr French Ana McLean-Scocuzza has placed a referral to Endocrinology. States she is agreeable to keep referral and will await their call.

## 2021-04-27 ENCOUNTER — Other Ambulatory Visit: Payer: Self-pay | Admitting: Gastroenterology

## 2021-04-30 ENCOUNTER — Telehealth: Payer: Self-pay | Admitting: Internal Medicine

## 2021-04-30 ENCOUNTER — Ambulatory Visit: Payer: Medicare Other | Admitting: Internal Medicine

## 2021-04-30 NOTE — Telephone Encounter (Signed)
Patient no-showed today's appointment; appointment was for 04/30/21, provider notified for review of record. Letter sent for patient to call in and re-schedule.

## 2021-05-02 ENCOUNTER — Ambulatory Visit (INDEPENDENT_AMBULATORY_CARE_PROVIDER_SITE_OTHER): Payer: Medicare Other

## 2021-05-02 ENCOUNTER — Encounter: Payer: Self-pay | Admitting: Internal Medicine

## 2021-05-02 ENCOUNTER — Ambulatory Visit (INDEPENDENT_AMBULATORY_CARE_PROVIDER_SITE_OTHER): Payer: Medicare Other | Admitting: Internal Medicine

## 2021-05-02 ENCOUNTER — Other Ambulatory Visit: Payer: Self-pay

## 2021-05-02 VITALS — BP 110/74 | HR 77 | Temp 97.7°F | Ht 64.5 in | Wt 165.6 lb

## 2021-05-02 DIAGNOSIS — H547 Unspecified visual loss: Secondary | ICD-10-CM

## 2021-05-02 DIAGNOSIS — M79642 Pain in left hand: Secondary | ICD-10-CM

## 2021-05-02 DIAGNOSIS — H02824 Cysts of left upper eyelid: Secondary | ICD-10-CM

## 2021-05-02 DIAGNOSIS — M778 Other enthesopathies, not elsewhere classified: Secondary | ICD-10-CM

## 2021-05-02 NOTE — Progress Notes (Signed)
Patient states that 3 weeks ago she was playing in the pool and hit the middle and ring finger on the left hand. Has been painful since.   Patient states pain is aggravated when trying to twist to open things. States pain radiated form hands to arms and down into her legs. States this happens bilaterally.

## 2021-05-02 NOTE — Patient Instructions (Addendum)
Melatonin 5 mg at night with L-theanine 100-200 mg at night for sleep supplements   Lidocaine with aspercream or voltaren gel or lidocaine pain patch over the counter Ice/heat   Brace to right forearm   4th pfizer covid 19 shot ok  Consider shingrix x 2 doses 2nd dose no more than 6 months of the 1st    Zoster Vaccine, Recombinant injection What is this medication? ZOSTER VACCINE (ZOS ter vak SEEN) is a vaccine used to reduce the risk of getting shingles. This vaccine is not used to treat shingles or nerve pain fromshingles. This medicine may be used for other purposes; ask your health care provider orpharmacist if you have questions. COMMON BRAND NAME(S): [facility] What should I tell my care team before I take this medication? They need to know if you have any of these conditions: cancer immune system problems an unusual or allergic reaction to Zoster vaccine, other medications, foods, dyes, or preservatives pregnant or trying to get pregnant breast-feeding How should I use this medication? This vaccine is injected into a muscle. It is given by a health care provider. A copy of Vaccine Information Statements will be given before each vaccination. Be sure to read this information carefully each time. This sheet may changeoften. Talk to your health care provider about the use of this vaccine in children.This vaccine is not approved for use in children. Overdosage: If you think you have taken too much of this medicine contact apoison control center or emergency room at once. NOTE: This medicine is only for you. Do not share this medicine with others. What if I miss a dose? Keep appointments for follow-up (booster) doses. It is important not to miss your dose. Call your health care provider if you are unable to keep anappointment. What may interact with this medication? medicines that suppress your immune system medicines to treat cancer steroid medicines like prednisone or  cortisone This list may not describe all possible interactions. Give your health care provider a list of all the medicines, herbs, non-prescription drugs, or dietary supplements you use. Also tell them if you smoke, drink alcohol, or use illegaldrugs. Some items may interact with your medicine. What should I watch for while using this medication? Visit your health care provider regularly. This vaccine, like all vaccines, may not fully protect everyone. What side effects may I notice from receiving this medication? Side effects that you should report to your doctor or health care professionalas soon as possible: allergic reactions (skin rash, itching or hives; swelling of the face, lips, or tongue) trouble breathing Side effects that usually do not require medical attention (report these toyour doctor or health care professional if they continue or are bothersome): chills headache fever nausea pain, redness, or irritation at site where injected tiredness vomiting This list may not describe all possible side effects. Call your doctor for medical advice about side effects. You may report side effects to FDA at1-800-FDA-1088. Where should I keep my medication? This vaccine is only given by a health care provider. It will not be stored athome. NOTE: This sheet is a summary. It may not cover all possible information. If you have questions about this medicine, talk to your doctor, pharmacist, orhealth care provider.  2022 Elsevier/Gold Standard (2019-10-14 16:23:07)  Elbow and Forearm Exercises Ask your health care provider which exercises are safe for you. Do exercises exactly as told by your health care provider and adjust them as directed. It is normal to feel mild stretching, pulling, tightness, or  discomfort as you do these exercises. Stop right away if you feel sudden pain or your pain gets worse. Do not begin these exercises until told by your health care provider. Range-of-motion  exercises These exercises warm up your muscles and joints and improve the movement and flexibility of your injured elbow and forearm. The exercises also help to relieve pain, numbness, and tingling. These exercises are done using the muscles in your injured elbow and forearm (active). Elbow flexion, active Hold your left / right arm at your side, and bend your elbow (flexion) as far as you can using only your arm muscles. Hold this position for __________ seconds. Slowly return to the starting position. Repeat __________ times. Complete this exercise __________ times a day. Elbow extension, active Hold your left / right arm at your side, and straighten your elbow (extension) as much as you can using only your arm muscles. Hold this position for __________ seconds. Slowly return to the starting position. Repeat __________ times. Complete this exercise __________ times a day. Active forearm rotation, supination This is an exercise in which you turn (rotate) your forearm palm up (supination). Stand or sit with your elbows at your sides. Bend your left / right elbow to a 90-degree angle (right angle). Rotate your palm up until you feel a gentle stretch on the inside of your forearm. Hold this position for __________ seconds. Slowly return to the starting position. Repeat __________ times. Complete this exercise __________ times a day. Active forearm rotation, pronation This is an exercise in which you turn (rotate) your forearm palm down (pronation). Stand or sit with your elbows at your sides. Bend your left / right elbow to a 90-degree angle (right angle). Rotate your palm down until you feel a gentle stretch on the top of your forearm. Hold this position for __________ seconds. Slowly return to the starting position. Repeat __________ times. Complete this exercise __________ times a day. Stretching exercises These exercises warm up your muscles and joints and improve the movement and  flexibility of your injured elbow and forearm. These exercises also help to relieve pain, numbness, and tingling. These exercises are done using your healthy elbow and forearm to help stretch the muscles in your injured elbow and forearm (active-assisted). Elbow flexion, active-assisted  Hold your left / right arm at your side, and bend your elbow (flexion) as much as you can using your left / right arm muscles. Use your other hand to bend your left / right elbow farther. To do this, gently push up on your forearm until you feel a gentle stretch on the back of your elbow. Hold this position for __________ seconds. Slowly return to the starting position. Repeat __________ times. Complete this exercise __________ times a day. Elbow extension, active-assisted  Hold your left / right arm at your side, and straighten your elbow (extension) as much as you can using your left / right arm muscles. Use your other hand to straighten the left / right elbow farther. To do this, gently push down on your forearm until you feel a gentle stretch on the inside of your elbow. Hold this position for __________ seconds. Slowly return to the starting position. Repeat __________ times. Complete this exercise __________ times a day. Active-assisted forearm rotation, supination This is an exercise in which you turn (rotate) your forearm palm up (supination). Sit with your left / right elbow bent in a 90-degree angle (right angle) with your forearm resting on a table. Keeping your upper body and shoulder  still, rotate your forearm so your palm faces upward. Use your other hand to help rotate your forearm further until you feel a gentle to moderate stretch. Hold this position for __________ seconds. Slowly release the stretch and return to the starting position. Repeat __________ times. Complete this exercise __________ times a day. Active-assisted forearm rotation, pronation This is an exercise in which you turn  (rotate) your forearm palm down (pronation). Sit with your left / right elbow bent in a 90-degree angle (right angle) with your forearm resting on a table. Keeping your upper body and shoulder still, rotate your forearm so your palm faces the tabletop. Use your other hand to help rotate your forearm further until you feel a gentle to moderate stretch. Hold this position for __________ seconds. Slowly release the stretch and return to the starting position. Repeat __________ times. Complete this exercise __________ times a day. Passive elbow flexion, supine Lie on your back (supine position). Extend your left / right arm up in the air, bracing it with your other hand. Let your left / right hand slowly lower toward your shoulder (passive flexion), while your elbow stays pointed toward the ceiling. You should feel a gentle stretch along the back of your upper arm and elbow. If instructed by your health care provider, you may increase the intensity of your stretch by adding a small wrist weight or hand weight. Hold this position for __________ seconds. Slowly return to the starting position. Repeat __________ times. Complete this exercise __________ times a day. Passive elbow extension, supine  Lie on your back (supine position). Make sure that you are in a comfortable position that lets you relax your arm muscles. Place a folded towel under your left / right upper arm so your elbow and shoulder are at the same height. Straighten your left / right arm so your elbow does not rest on the bed or towel. Let the weight of your hand stretch your elbow (passive extension). Keep your arm and chest muscles relaxed. You should feel a stretch on the inside of your elbow. If told by your health care provider, you may increase the intensity of your stretch by adding a small wrist weight or hand weight. Hold this position for __________ seconds. Slowly release the stretch. Repeat __________ times. Complete this  exercise __________ times a day. Strengthening exercises These exercises build strength and endurance in your elbow and forearm. Endurance is the ability to use your muscles for a long time, even after theyget tired. Elbow flexion, isometric  Stand or sit up straight. Bend your left / right elbow in a 90-degree angle (right angle), and keep your forearm at the height of your waist. Your thumb should be pointed toward the ceiling (neutral forearm). Place your other hand on top of your left / right forearm. Gently push down while you resist with your left / right arm (isometric flexion). Push as hard as you can with both arms without causing any pain or movement at your left / right elbow. Hold this position for __________ seconds. Slowly release the tension in both arms. Let your muscles relax completely before you repeat the exercise. Repeat __________ times. Complete this exercise __________ times a day. Elbow extension, isometric  Stand or sit up straight. Place your left / right arm so your palm faces your abdomen and is at the height of your waist. Place your other hand on the underside of your left / right forearm. Gently push up while you resist with your  left / right arm (isometric extension). Push as hard as you can with both arms without causing any pain or movement at your left / right elbow. Hold this position for __________ seconds. Slowly release the tension in both arms. Let your muscles relax completely before you repeat the exercise. Repeat __________ times. Complete this exercise __________ times a day. Elbow flexion with forearm palm up  Sit on a firm chair without armrests, or stand up. Place your left / right arm at your side with your elbow straight and your palm facing forward. Holding a __________weight or gripping a rubber exercise band or tubing, bend your elbow to bring your hand toward your shoulder (flexion). Hold this position for __________ seconds. Slowly  return to the starting position. Repeat __________ times. Complete this exercise __________ times a day. Elbow extension, active  Sit on a firm chair without armrests, or stand up. Hold a rubber exercise band or tubing in both hands. Keeping your upper arms at your sides, bring both hands up to your left / right shoulder. Keep your left / right hand just below your other hand. Straighten your left / right elbow (extension) while keeping your other arm still. Hold this position for __________ seconds. Control the resistance of the band or tubing as you return to the starting position. Repeat __________ times. Complete this exercise __________ times a day. Forearm rotation, supination  Sit with your left / right forearm supported on a table. Your elbow should be at waist height and bent at a 90-degree angle (right angle). Gently grasp a lightweight hammer. Rest your hand over the edge of the table with your palm facing down. Without moving your left / right elbow, slowly rotate your forearm to turn your palm up toward the ceiling (supination). Hold this position for __________ seconds. Slowly return to the starting position. Repeat __________ times. Complete this exercise __________ times a day. Forearm rotation, pronation  Sit with your left / right forearm supported on a table. Keep your elbow below shoulder height. Gently grasp a lightweight hammer. Rest your hand over the edge of the table with your palm facing up. Without moving your left / right elbow, slowly rotate your forearm to turn your palm down toward the floor (pronation). Hold this position for __________seconds. Slowly return to the starting position. Repeat __________ times. Complete this exercise __________ times a day. This information is not intended to replace advice given to you by your health care provider. Make sure you discuss any questions you have with your healthcare provider. Document Revised: 12/30/2018  Document Reviewed: 09/29/2018 Elsevier Patient Education  2022 [employer]   Hand Exercises Hand exercises can be helpful for almost anyone. These exercises can strengthen the hands, improve flexibility and movement, and increase blood flow to the hands. These results can make work and daily tasks easier. Hand exercises can be especially helpful for people who have joint pain from arthritis or have nerve damage from overuse (carpal tunnel syndrome). These exercises can also help people who have injured a hand. Exercises Most of these hand exercises are gentle stretching and motion exercises. It is usually safe to do them often throughout the day. Warming up your hands before exercise may help to reduce stiffness. You can do this with gentle massage orby placing your hands in warm water for 10-15 minutes. It is normal to feel some stretching, pulling, tightness, or mild discomfort as you begin new exercises. This will gradually improve. Stop an exercise right away if  you feel sudden, severe pain or your pain gets worse. Ask your healthcare provider which exercises are best for you. Knuckle bend or "claw" fist Stand or sit with your arm, hand, and all five fingers pointed straight up. Make sure to keep your wrist straight during the exercise. Gently bend your fingers down toward your palm until the tips of your fingers are touching the top of your palm. Keep your big knuckle straight and just bend the small knuckles in your fingers. Hold this position for __________ seconds. Straighten (extend) your fingers back to the starting position. Repeat this exercise 5-10 times with each hand. Full finger fist Stand or sit with your arm, hand, and all five fingers pointed straight up. Make sure to keep your wrist straight during the exercise. Gently bend your fingers into your palm until the tips of your fingers are touching the middle of your palm. Hold this position for __________ seconds. Extend your  fingers back to the starting position, stretching every joint fully. Repeat this exercise 5-10 times with each hand. Straight fist Stand or sit with your arm, hand, and all five fingers pointed straight up. Make sure to keep your wrist straight during the exercise. Gently bend your fingers at the big knuckle, where your fingers meet your hand, and the middle knuckle. Keep the knuckle at the tips of your fingers straight and try to touch the bottom of your palm. Hold this position for __________ seconds. Extend your fingers back to the starting position, stretching every joint fully. Repeat this exercise 5-10 times with each hand. Tabletop Stand or sit with your arm, hand, and all five fingers pointed straight up. Make sure to keep your wrist straight during the exercise. Gently bend your fingers at the big knuckle, where your fingers meet your hand, as far down as you can while keeping the small knuckles in your fingers straight. Think of forming a tabletop with your fingers. Hold this position for __________ seconds. Extend your fingers back to the starting position, stretching every joint fully. Repeat this exercise 5-10 times with each hand. Finger spread Place your hand flat on a table with your palm facing down. Make sure your wrist stays straight as you do this exercise. Spread your fingers and thumb apart from each other as far as you can until you feel a gentle stretch. Hold this position for __________ seconds. Bring your fingers and thumb tight together again. Hold this position for __________ seconds. Repeat this exercise 5-10 times with each hand. Making circles Stand or sit with your arm, hand, and all five fingers pointed straight up. Make sure to keep your wrist straight during the exercise. Make a circle by touching the tip of your thumb to the tip of your index finger. Hold for __________ seconds. Then open your hand wide. Repeat this motion with your thumb and each finger on  your hand. Repeat this exercise 5-10 times with each hand. Thumb motion Sit with your forearm resting on a table and your wrist straight. Your thumb should be facing up toward the ceiling. Keep your fingers relaxed as you move your thumb. Lift your thumb up as high as you can toward the ceiling. Hold for __________ seconds. Bend your thumb across your palm as far as you can, reaching the tip of your thumb for the small finger (pinkie) side of your palm. Hold for __________ seconds. Repeat this exercise 5-10 times with each hand. Grip strengthening  Hold a stress ball or other soft  ball in the middle of your hand. Slowly increase the pressure, squeezing the ball as much as you can without causing pain. Think of bringing the tips of your fingers into the middle of your palm. All of your finger joints should bend when doing this exercise. Hold your squeeze for __________ seconds, then relax. Repeat this exercise 5-10 times with each hand. Contact a health care provider if: Your hand pain or discomfort gets much worse when you do an exercise. Your hand pain or discomfort does not improve within 2 hours after you exercise. If you have any of these problems, stop doing these exercises right away. Do not do them again unless your health care provider says that you can. Get help right away if: You develop sudden, severe hand pain or swelling. If this happens, stop doing these exercises right away. Do not do them again unless your health care provider says that you can. This information is not intended to replace advice given to you by your health care provider. Make sure you discuss any questions you have with your healthcare provider. Document Revised: 12/30/2018 Document Reviewed: 09/09/2018 Elsevier Patient Education  2022 [employer]  Climber's Elbow Rehab Ask your health care provider which exercises are safe for you. Do exercises exactly as told by your health care provider and adjust them  as directed. It is normal to feel mild stretching, pulling, tightness, or discomfort as you do these exercises. Stop right away if you feel sudden pain or your pain gets worse. Do not begin these exercises until told by your health care provider. Stretching and range-of-motion exercises These exercises warm up your muscles and joints and improve the movement and flexibility of your elbow. The exercises also help to relieve pain, numbness,and tingling. Forearm rotation, elbow bent Stand or sit with your left / right elbow bent in a 90-degree angle (right angle). Position your forearm so that the thumb is facing the ceiling (neutral position). Rotate your palm up until it cannot go any farther. Use your other hand to help turn it more if able. Hold this position for _________ seconds. Rotate your palm down until it cannot go any farther. Use your other hand to help turn it more if able. Hold this position for _________ seconds. Repeat __________ times. Complete this exercise __________ times a day. Forearm rotation, elbow straight Stand or sit with your left / right elbow straight. Position your forearm so that the thumb is facing the ceiling (neutral position). Rotate your palm up until it cannot go any farther. Use your other hand to help turn it more if able. Hold this position for _________ seconds. Rotate your palm down until it cannot go any farther. Use your other hand to help turn it more if able. Hold this position for _________ seconds. Repeat __________ times. Complete this exercise __________ times a day. Elbow range of motion Stand or sit with your left / right elbow bent in a 90-degree angle (right angle). Position your forearm so that the thumb is facing the ceiling (neutral position). Slowly straighten your elbow until you feel a stretch. Hold this position for __________ seconds. Slowly bend your elbow until you feel a stretch, or until you touch your thumb to your shoulder. Hold  this position for __________ seconds. Repeat __________ times. Complete this exercise __________ times a day. Elbow extension stretch  Lie on a bed on your back in a comfortable position that allows you to relax your arm muscles. Place a folded towel  under your left / right upper arm so your elbow and shoulder are the same height. Straighten your arm as much as you can, allowing your arm to rest over the edge of the bed. Let the weight of your hand and forearm stretch the front of your elbow. If instructed by your health care provider, hold a _________ weight in your hand to increase the intensity of the stretch. Hold this position for __________ seconds. Slowly release the stretch. Repeat __________ times. Complete this exercise __________ times a day. Strengthening exercises These exercises build strength and endurance in the muscles on the front side of your elbow. Endurance is the ability to use your muscles for a long time,even after they get tired. Forearm rotation, supination  Sit with your left / right forearm supported on a table. Your elbow should be at or below waist height. If directed, hold a hammer or weight in your hand. Rest your hand over the edge of the table, palm down. Without moving your elbow or wrist, rotate your forearm so your palm faces up toward the ceiling. If you are using a hammer, begin by holding the hammer near the head. When this exercise gets easier for you, hold the hammer farther down the handle. Hold this position for __________ seconds. Slowly return to the starting position. Repeat __________ times. Complete this exercise __________ times a day. Biceps curl, isometric Stand or sit with your left / right arm at your side, elbow bent to a 90-degree angle (right angle). Rotate your left / right forearm so your palm is facing up toward the ceiling. If told by your health care provider, do this exercise with your palm down toward the floor. Place your  other hand on top of your forearm. Slowly try to bend your left / right elbow while your other hand resists (isometric). Use about 50% effort with both arms. You may be instructed to use more and more effort with your arms each week. Try not to let your arm move. Hold this position for __________ seconds. Let your muscles relax completely before starting the next repetition. Repeat __________ times. Complete this exercise __________ times a day. Elbow flexion, supination This exercise is also called biceps curls. You can use a weight or an exercise band for this exercise. Sit on a firm chair without armrests, or stand up. Hold a __________ weight in your left / right hand, or hold an exercise band with both hands. Your palms should face out, away from your body, at the starting position (supination). Keeping your other arm straight, bend your left / right elbow and move your hand up toward your shoulder. Keep your elbow at your side while you bend it. Hold this position for __________ seconds. Slowly return your arm to the starting position. Do not let your elbow extend or straighten so much that you feel pain. Repeat __________ times. Complete this exercise __________ times a day. Elbow flexion, neutral This exercise is also called hammer curls. You can use a weight or an exercise band for this exercise. Sit on a firm chair without armrests, or stand up. Hold a __________ weight in your left / right hand, or hold an exercise band with both hands. Your palms should face each other at the starting position (neutral position). Keeping your other arm straight, bend your left / right elbow and move your hand up toward your shoulder. Keep your elbow at your side while you bend it. Slowly return your hand to the starting  position. Do not let your elbow overextend or straighten so much that it is painful. Repeat __________ times. Complete this exercise __________ times a day. Hammer curls,  eccentric In this exercise, the muscles of the arm are being extended or straightened under tension (eccentric muscle contraction). Sit on a firm chair without armrests. Let your left / right arm rest at your side. Position your forearm so that the thumb is facing the ceiling (neutral position) if your elbow is bent. Hold your wrist with your other hand. Hold a __________ weight or grip an exercise band or tubing. Use the uninjured hand to help bend your left / right elbow. Very slowly straighten your elbow using your left / right arm by itself. Do not let your elbow extend or straighten so much that you feel pain. Repeat __________ times. Complete this exercise __________ times a day. This information is not intended to replace advice given to you by your health care provider. Make sure you discuss any questions you have with your healthcare provider. Document Revised: 09/09/2018 Document Reviewed: 09/09/2018 Elsevier Patient Education  2022 Elsevier Inc.  Tennis Elbow Rehab Ask your health care provider which exercises are safe for you. Do exercises exactly as told by your health care provider and adjust them as directed. It is normal to feel mild stretching, pulling, tightness, or discomfort as you do these exercises. Stop right away if you feel sudden pain or your pain gets worse. Do not begin these exercises until told by your health care provider. Stretching and range-of-motion exercises These exercises warm up your muscles and joints and improve the movement andflexibility of your elbow. Wrist flexion, assisted  Straighten your left / right elbow in front of you with your palm facing down toward the floor. If told by your health care provider, bend your left / right elbow to a 90-degree angle (right angle) at your side instead of holding it straight. With your other hand, gently push over the back of your left / right hand so your fingers point toward the floor (flexion). Stop when you  feel a gentle stretch on the back of your forearm. Hold this position for __________ seconds. Repeat __________ times. Complete this exercise __________ times a day. Wrist extension, assisted  Straighten your left / right elbow in front of you with your palm facing up toward the ceiling. If told by your health care provider, bend your left / right elbow to a 90-degree angle (right angle) at your side instead of holding it straight. With your other hand, gently pull your left / right hand and fingers toward the floor (extension). Stop when you feel a gentle stretch on the palm side of your forearm. Hold this position for __________ seconds. Repeat __________ times. Complete this exercise __________ times a day. Assisted forearm rotation, supination Sit or stand with your elbows at your side. Bend your left / right elbow to a 90-degree angle (right angle). Using your uninjured hand, turn your left / right palm up toward the ceiling (supination) until you feel a gentle stretch along the inside of your forearm. Hold this position for __________ seconds. Repeat __________ times. Complete this exercise __________ times a day. Assisted forearm rotation, pronation Sit or stand with your elbows at your side. Bend your left / right elbow to a 90-degree angle (right angle). Using your uninjured hand, turn your left / right palm down toward the floor (pronation) until you feel a gentle stretch along the outside of your forearm. Hold  this position for __________ seconds. Repeat __________ times. Complete this exercise __________ times a day. Strengthening exercises These exercises build strength and endurance in your forearm and elbow. Endurance is the ability to use your muscles for a long time, even after theyget tired. Radial deviation  Stand with a __________ weight or a hammer in your left / right hand. Or, sit while holding a rubber exercise band or tubing, with your left / right forearm supported  on a table or countertop. Position your forearm so that the thumb is facing the ceiling, as if you are going to clap your hands. This is the neutral position. Raise your hand upward in front of you so your thumb moves toward the ceiling (radial deviation), or pull up on the rubber tubing. Keep your forearm and elbow still while you move your wrist only. Hold this position for __________ seconds. Slowly return to the starting position. Repeat __________ times. Complete this exercise __________ times a day. Wrist extension, eccentric Sit with your left / right forearm palm-down and supported on a table or other surface. Let your left / right wrist extend over the edge of the surface. Hold a __________ weight or a piece of exercise band or tubing in your left / right hand. If using a rubber exercise band or tubing, hold the other end of the tubing with your other hand. Use your uninjured hand to move your left / right hand up toward the ceiling. Take your uninjured hand away and slowly return to the starting position using only your left / right hand. Lowering your arm under tension is called eccentric extension. Repeat __________ times. Complete this exercise __________ times a day. Wrist extension Do not do this exercise if it causes pain at the outside of your elbow. Only do this exercise once instructed by your health care provider. Sit with your left / right forearm supported on a table or other surface and your palm turned down toward the floor. Let your left / right wrist extend over the edge of the surface. Hold a __________ weight or a piece of rubber exercise band or tubing. If you are using a rubber exercise band or tubing, hold the band or tubing in place with your other hand to provide resistance. Slowly bend your wrist so your hand moves up toward the ceiling (extension). Move only your wrist, keeping your forearm and elbow still. Hold this position for __________ seconds. Slowly  return to the starting position. Repeat __________ times. Complete this exercise __________ times a day. Forearm rotation, supination To do this exercise, you will need a lightweight hammer or rubber mallet. Sit with your left / right forearm supported on a table or other surface. Bend your elbow to a 90-degree angle (right angle). Position your forearm so that your palm is facing down toward the floor, with your hand resting over the edge of the table. Hold a hammer in your left / right hand. To make this exercise easier, hold the hammer near the head of the hammer. To make this exercise harder, hold the hammer near the end of the handle. Without moving your wrist or elbow, slowly rotate your forearm so your palm faces up toward the ceiling (supination). Hold this position for __________ seconds. Slowly return to the starting position. Repeat __________ times. Complete this exercise __________ times a day. Shoulder blade squeeze Sit in a stable chair or stand with good posture. If you are sitting down, do not let your back touch the  back of the chair. Your arms should be at your sides with your elbows bent to a 90-degree angle (right angle). Position your forearms so that your thumbs are facing the ceiling (neutral position). Without lifting your shoulders up, squeeze your shoulder blades tightly together. Hold this position for __________ seconds. Slowly release and return to the starting position. Repeat __________ times. Complete this exercise __________ times a day. This information is not intended to replace advice given to you by your health care provider. Make sure you discuss any questions you have with your healthcare provider. Document Revised: 11/30/2019 Document Reviewed: 11/30/2019 Elsevier Patient Education  2022 [employer]
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
## 2021-05-07 DIAGNOSIS — S66912A Strain of unspecified muscle, fascia and tendon at wrist and hand level, left hand, initial encounter: Secondary | ICD-10-CM | POA: Diagnosis not present

## 2021-05-07 DIAGNOSIS — S66911A Strain of unspecified muscle, fascia and tendon at wrist and hand level, right hand, initial encounter: Secondary | ICD-10-CM | POA: Diagnosis not present

## 2021-05-14 ENCOUNTER — Telehealth: Payer: Self-pay | Admitting: Internal Medicine

## 2021-05-14 NOTE — Telephone Encounter (Signed)
Rejection Reason - Patient did not respond" Trilby Drummer said on May 14, 2021 8:40 AM  Msg from [facility]

## 2021-05-15 ENCOUNTER — Other Ambulatory Visit: Payer: Self-pay

## 2021-05-15 ENCOUNTER — Ambulatory Visit
Payer: Medicare Other | Attending: Student in an Organized Health Care Education/Training Program | Admitting: Student in an Organized Health Care Education/Training Program

## 2021-05-15 ENCOUNTER — Other Ambulatory Visit: Payer: Self-pay | Admitting: Family

## 2021-05-15 DIAGNOSIS — H579 Unspecified disorder of eye and adnexa: Secondary | ICD-10-CM

## 2021-05-15 DIAGNOSIS — G894 Chronic pain syndrome: Secondary | ICD-10-CM

## 2021-05-15 DIAGNOSIS — M47818 Spondylosis without myelopathy or radiculopathy, sacral and sacrococcygeal region: Secondary | ICD-10-CM

## 2021-05-15 DIAGNOSIS — G5701 Lesion of sciatic nerve, right lower limb: Secondary | ICD-10-CM | POA: Diagnosis not present

## 2021-05-15 NOTE — Progress Notes (Signed)
close

## 2021-05-15 NOTE — Telephone Encounter (Signed)
Called and spoke to [name] concerning her eye. Pt states that she would like a referral for her eye and scheduled for an appointment with Claris Che arnett to discuss. Pt also had concerns with Endocrinology and states that she was never called for her referral and she personally called and they can't see her until November. Pt asks for a sooner appointment if possible.

## 2021-05-15 NOTE — Progress Notes (Signed)
Patient: [name]  Service Category: E/M  Provider: Gillis Santa, MD  DOB: [birth year]  DOS: 05/15/2021  Location: Office  MRN: [id number]  Setting: Ambulatory outpatient  Referring Provider: McLean-Scocuzza, Olivia [name] *  Type: Established Patient  Specialty: Interventional Pain Management  PCP: McLean-Scocuzza, Nino Glow, MD  Location: Home  Delivery: TeleHealth     Virtual Encounter - Pain Management PROVIDER NOTE: Information contained herein reflects review and annotations entered in association with encounter. Interpretation of such information and data should be left to medically-trained personnel. Information provided to patient can be located elsewhere in the medical record under "Patient Instructions". Document created using STT-dictation technology, any transcriptional errors that may result from process are unintentional.    Contact & Pharmacy Preferred: [phone] Home: [phone] (home) Mobile: [phone] (mobile) E-mail: [email or web address], [place] - [place] [address] Phone: [phone] Fax: [phone]   Pre-screening  [name] offered "in-person" vs "virtual" encounter. She indicated preferring virtual for this encounter.   Reason COVID-19*  Social distancing based on CDC and AMA recommendations.   I contacted [name] on 05/15/2021 via video conference.      I clearly identified myself as Gillis Santa, MD. I verified that I was speaking with the correct person using two identifiers (Name: [name], and date of birth: [name], [name]).  Consent I sought verbal advanced consent from [name] for virtual visit interactions. I informed [name] of possible security and privacy concerns, risks, and limitations associated with providing "not-in-person" medical evaluation and management services. I also informed [name] of the availability of "in-person" appointments. Finally, I informed  her that there would be a charge for the virtual visit and that she could be  personally, fully or partially, financially responsible for it. [name] expressed understanding and agreed to proceed.   Historic Elements   [name] is a 65 y.o. year old, female patient evaluated today after our last contact on 04/17/2021. [name]  has a past medical history of COVID-19 (10/[name]/2020), Depression, Hypothyroidism, PTSD (post-traumatic stress disorder), and Wears glasses. She also  has a past surgical history that includes Back surgery; Breast surgery; Augmentation mammaplasty (Bilateral, 2006); Esophagogastroduodenoscopy (egd) with propofol (N/A, 10/24/2020); Esophagogastroduodenoscopy (egd) with propofol (N/A, 12/20/2020); Esophageal manometry (N/A, 03/04/2021); 24 hour ph study (N/A, 03/04/2021); and PH impedance study (N/A, 03/04/2021). [name] has a current medication list which includes the following prescription(s): amlodipine, ascorbic acid, calcium citrate, cyanocobalamin, cyclobenzaprine, famotidine, levothyroxine, melatonin, meloxicam, multiple vitamins-calcium, nortriptyline, omeprazole, probiotic product, sucralfate, turmeric-ginger, cholecalciferol, and [DISCONTINUED] trazodone. She  reports that she has never smoked. She has never used smokeless tobacco. She reports that she does not currently use alcohol. She reports that she does not use drugs. [name] has No Known Allergies.   HPI  Today, she is being contacted for a post-procedure assessment.   Post-Procedure Evaluation  Procedure (04/17/2021):   Type: Diagnostic Sacroiliac Joint Steroid Injection          & Right Piriformis Trigger point injection  under fluoroscopy   Region: Inferior Lumbosacral Region Level: PIIS (Posterior Inferior Iliac Spine) Laterality: Right-Side  Anxiolysis: Please see nurses note.  Effectiveness during initial hour after procedure (Ultra-Short Term Relief): 100 %   Local anesthetic used:  Long-acting (4-6 hours) Effectiveness: Defined as any analgesic benefit obtained secondary to the administration of local anesthetics. This carries significant diagnostic value as to the etiological location, or anatomical origin, of  the pain. Duration of benefit is expected to coincide with the duration of the local anesthetic used.  Effectiveness during initial 4-6 hours after procedure (Short-Term Relief): 100 %   Long-term benefit: Defined as any relief past the pharmacologic duration of the local anesthetics.  Effectiveness past the initial 6 hours after procedure (Long-Term Relief): 100 % (lasting a few days then pain returned)   Benefits, current: Defined as benefit present at the time of this evaluation.   Analgesia:  20-25% Function: [name] reports improvement in function    Laboratory Chemistry Profile   Renal Lab Results  Component Value Date   BUN 20 04/02/2021   CREATININE 0.66 04/02/2021   GFR 93.55 04/02/2021   GFRAA >60 10/04/2019   GFRNONAA >60 08/24/2020    Hepatic Lab Results  Component Value Date   AST 22 04/02/2021   ALT 21 04/02/2021   ALBUMIN 4.6 04/02/2021   ALKPHOS 81 04/02/2021    Electrolytes Lab Results  Component Value Date   NA 139 04/02/2021   K 4.2 04/02/2021   CL 103 04/02/2021   CALCIUM 10.0 04/02/2021    Bone Lab Results  Component Value Date   VD25OH 71.21 04/02/2021    Inflammation (CRP: Acute Phase) (ESR: Chronic Phase) No results found for: CRP, ESRSEDRATE, LATICACIDVEN       Note: Above Lab results reviewed.  Imaging  DG Hand Complete Left CLINICAL DATA:  Left hand pain.  EXAM: LEFT HAND - COMPLETE 3+ VIEW  COMPARISON:  None.  FINDINGS: There is no evidence of fracture or dislocation. There is no evidence of arthropathy or other focal bone abnormality. Soft tissues are unremarkable.  IMPRESSION: Negative.  Electronically Signed   By: Marijo Conception M.D.   On: 05/03/2021 14:32  Assessment  The primary  encounter diagnosis was Piriformis syndrome, right. Diagnoses of SI joint arthritis and Chronic pain syndrome were also pertinent to this visit.  Plan of Care    [name] has a current medication list which includes the following long-term medication(s): calcium citrate, levothyroxine, nortriptyline, omeprazole, sucralfate, and [DISCONTINUED] trazodone.  Patient is status post right sacroiliac joint and right piriformis injection.  This was done on 04/17/2021.  She states that she noticed fairly significant pain relief approximately 3 days after her injection which lasted approximately 3 weeks.  She states that she has had return of her pain over the last week.  It is similar to the pain that she was experiencing prior to her procedure.  We discussed repeating right sacroiliac joint injection #2 and right piriformis injection #2.  This next injection, we will add more volume to see if that prolongs pain relief duration.  Risks and benefits reviewed and patient would like to proceed.  Future considerations include diagnostic lumbar facet medial branch nerve blocks on the right side as the patient also has history of lumbar spine surgery and lumbar facet arthropathy.  With  Orders:  Orders Placed This Encounter  Procedures   SACROILIAC JOINT INJECTION    Standing Status:   Future    Standing Expiration Date:   06/15/2021    Scheduling Instructions:     Right, without     Timeframe: ASAP    Order Specific Question:   Where will this procedure be performed?    Answer:   [facility]   TRIGGER POINT INJECTION    Area: Buttocks region (gluteal area) Indications: Piriformis muscle pain;  RIGHT piriformis-syndrome; piriformis muscle spasms (O11.572). CPT code:  20552    Standing Status:   Future    Standing Expiration Date:   05/15/2022    Scheduling Instructions:     Type: Myoneural block (TPI) of piriformis muscle.     Side:  RIGHT     Sedation: Patient's choice.     Timeframe:  Today    Order Specific Question:   Where will this procedure be performed?    Answer:   [facility]    Follow-up plan:   Return in about 2 weeks (around 05/29/2021) for R SI-J, Right piriformis , without sedation.     Right SI-J and Right Piriformis 04/17/21     Recent Visits Date Type Provider Dept  04/17/21 Procedure visit Gillis Santa, MD [facility]  04/09/21 Office Visit Gillis Santa, MD [facility]  Showing recent visits within past 90 days and meeting all other requirements Today's Visits Date Type Provider Dept  05/15/21 Telemedicine Gillis Santa, MD [facility]  Showing today's visits and meeting all other requirements Future Appointments No visits were found meeting these conditions. Showing future appointments within next 90 days and meeting all other requirements I discussed the assessment and treatment plan with the patient. The patient was provided an opportunity to ask questions and all were answered. The patient agreed with the plan and demonstrated an understanding of the instructions.  Patient advised to call back or seek an in-person evaluation if the symptoms or condition worsens.  Duration of encounter: 54mnutes.  Note by: BGillis Santa MD Date: 05/15/2021; Time: 3:06 PM

## 2021-05-15 NOTE — Telephone Encounter (Signed)
Call pt  I have placed a new referral back to ophthalmology for eye lesion to be evaluated.  Please asked patient to be checking her phone as they should be calling her. Let us know if you dont hear back within a week in regards to an appointment being scheduled.   In regards to seeing endocrinology ( dr Tedd Sias 07/24/21)as it relates to osteoporosis, she would have to call and asked for cancellation list.  Has she call their office to see if there are any cancellations for Dr Tedd Sias?   If she would prefer, we can try to see if endocrinology will see her any sooner in [place] however I do not know what the current wait is.

## 2021-05-15 NOTE — Telephone Encounter (Signed)
It appears was referred for cyst on eyelid. Please call pt and f/u.  If agrees to referral, needs appt.

## 2021-05-16 NOTE — Patient Instructions (Signed)

## 2021-05-17 NOTE — Telephone Encounter (Signed)
LMTCB

## 2021-05-21 NOTE — Telephone Encounter (Signed)
Placed call to pt. Pt has received call for ophthalmologist and is on the cancellation list for endocrinology.

## 2021-06-03 ENCOUNTER — Ambulatory Visit (INDEPENDENT_AMBULATORY_CARE_PROVIDER_SITE_OTHER): Payer: Medicare Other | Admitting: Pharmacist

## 2021-06-03 DIAGNOSIS — M810 Age-related osteoporosis without current pathological fracture: Secondary | ICD-10-CM

## 2021-06-03 DIAGNOSIS — E039 Hypothyroidism, unspecified: Secondary | ICD-10-CM

## 2021-06-03 DIAGNOSIS — I10 Essential (primary) hypertension: Secondary | ICD-10-CM

## 2021-06-03 NOTE — Patient Instructions (Signed)
[name],   Take the omeprazole about 30 minutes before breakfast and 30 minutes before lunch. The famotidine can be taken at bedtime. If you need it, the sucralfate needs to be separated from other medications. Wait at least 2 hours after taking sucralfate before taking any other medications.   After you take levothyroxine, wait at least 30 minutes before other medications or breakfast.   We recommend you get the yearly influenza vaccine for this season. We recommend you get the updated bivalent COVID-19 booster, at least 2 months after any prior doses.   Call me with any questions!  [name], PharmD [phone]  Visit Information  PATIENT GOALS:  Goals Addressed               This Visit's Progress     Patient Stated     Medication Monitoring (pt-stated)        Patient Goals/Self-Care Activities Over the next 90 days, patient will:  - take medications as prescribed collaborate with provider on medication access solutions         The patient verbalized understanding of instructions, educational materials, and care plan provided today and agreed to receive a mailed copy of patient instructions, educational materials, and care plan.   Plan: Telephone follow up appointment with care management team member scheduled for:  ~ 12 weeks  [name], PharmD, [place], CPP Clinical Pharmacist [employer] [facility] [employer] [phone]

## 2021-06-03 NOTE — Chronic Care Management (AMB) (Signed)
Chronic Care Management Pharmacy Note  06/03/2021 Name:  [name] MRN:  [id number] DOB:  15-Aug-1956   Subjective: [name] is an 65 y.o. year old female who is a primary patient of McLean-Scocuzza, Nino Glow, MD.  Collaborating with covering provider Tommi Rumps, MD. The CCM team was consulted for assistance with disease management and care coordination needs.    Engaged with patient by telephone for follow up visit in response to provider referral for pharmacy case management and/or care coordination services.   Consent to Services:  The patient was given information about Chronic Care Management services, agreed to services, and gave verbal consent prior to initiation of services.  Please see initial visit note for detailed documentation.   Patient Care Team: McLean-Scocuzza, Nino Glow, MD as PCP - General (Internal Medicine) De Hollingshead, RPH-CPP (Pharmacist)   Objective:  Lab Results  Component Value Date   CREATININE 0.66 04/02/2021   CREATININE 0.71 08/24/2020   CREATININE 0.70 10/04/2019    Lab Results  Component Value Date   HGBA1C 5.6 04/02/2021   Last diabetic Eye exam: No results found for: HMDIABEYEEXA  Last diabetic Foot exam: No results found for: HMDIABFOOTEX      Component Value Date/Time   CHOL 189 10/30/2020 1053   TRIG 173.0 (H) 10/30/2020 1053   HDL 63.70 10/30/2020 1053   CHOLHDL 3 10/30/2020 1053   VLDL 34.6 10/30/2020 1053   LDLCALC 90 10/30/2020 1053    Hepatic Function Latest Ref Rng & Units 04/02/2021 04/02/2021 10/30/2020  Total Protein 6.0 - 8.5 g/dL 7.3 7.3 7.0  Albumin 3.5 - 5.2 g/dL 4.6 - 4.3  AST 0 - 37 U/L 22 - 26  ALT 0 - 35 U/L 21 - 27  Alk Phosphatase 39 - 117 U/L 81 - 73  Total Bilirubin 0.2 - 1.2 mg/dL 0.5 - 0.5  Bilirubin, Direct 0.0 - 0.3 mg/dL - - 0.1    Lab Results  Component Value Date/Time   TSH 3.30 04/02/2021 03:03 PM   TSH 2.69 10/30/2020 10:53 AM    CBC Latest Ref Rng & Units 04/02/2021  08/24/2020 10/04/2019  WBC 4.0 - 10.5 K/uL 5.0 7.6 7.0  Hemoglobin 12.0 - 15.0 g/dL 12.8 12.7 13.1  Hematocrit 36.0 - 46.0 % 37.7 38.0 39.4  Platelets 150.0 - 400.0 K/uL 207.0 228 266    Lab Results  Component Value Date/Time   VD25OH 71.21 04/02/2021 03:03 PM   VD25OH 52.95 11/30/2019 11:02 AM    Clinical ASCVD: No  The 10-year ASCVD risk score (Arnett DK, et al., 2019) is: 4%   Values used to calculate the score:     Age: 71 years     Sex: Female     Is Non-Hispanic African American: No     Diabetic: No     Tobacco smoker: No     Systolic Blood Pressure: 093 mmHg     Is BP treated: Yes     HDL Cholesterol: 63.7 mg/dL     Total Cholesterol: 189 mg/dL     Social History   Tobacco Use  Smoking Status Never  Smokeless Tobacco Never   BP Readings from Last 3 Encounters:  05/02/21 110/74  04/17/21 (!) 133/94  04/09/21 115/89   Pulse Readings from Last 3 Encounters:  05/02/21 77  04/17/21 92  04/09/21 76   Wt Readings from Last 3 Encounters:  05/02/21 165 lb 9.6 oz (75.1 kg)  04/17/21 165 lb (74.8 kg)  04/09/21 166  lb (75.3 kg)    Assessment: Review of patient past medical history, allergies, medications, health status, including review of consultants reports, laboratory and other test data, was performed as part of comprehensive evaluation and provision of chronic care management services.   SDOH:  (Social Determinants of Health) assessments and interventions performed:  SDOH Interventions    Flowsheet Row Most Recent Value  SDOH Interventions   Financial Strain Interventions Intervention Not Indicated       CCM Care Plan  No Known Allergies  Medications Reviewed Today     Reviewed by De Hollingshead, RPH-CPP (Pharmacist) on 06/03/21 at 1450  Med List Status: <None>   Medication Order Taking? Sig Documenting Provider Last Dose Status Informant  amLODipine (NORVASC) 2.5 MG tablet [id number] Yes Take 2.5 mg by mouth daily. [provider]  Taking Active   Ascorbic Acid (VITAMIN C PO) [id number] Yes Take 1,000 mg by mouth daily. [provider] Taking Active   CALCIUM CITRATE PO [id number] Yes Take 600 mg by mouth daily. [provider] Taking Active   Cyanocobalamin (B-12 PO) [id number] Yes Take 1 tablet by mouth daily. [provider] Taking Active   cyclobenzaprine (FLEXERIL) 5 MG tablet [id number] No Take 1 tablet (5 mg total) by mouth at bedtime as needed for muscle spasms.  Patient not taking: Reported on 06/03/2021   McLean-Scocuzza, Nino Glow, MD Not Taking Active   famotidine (PEPCID) 20 MG tablet [id number] Yes Take 20 mg by mouth daily. [provider] Taking Active   levothyroxine (SYNTHROID) 175 MCG tablet [id number] Yes Take 1 tablet (175 mcg total) by mouth daily before breakfast. 30 min before food McLean-Scocuzza, Nino Glow, MD Taking Active   MELATONIN PO [id number] Yes Take 1 tablet by mouth daily. [provider] Taking Active            Med Note Darnelle Maffucci, Arville Lime   Tue Jan 15, 2021  9:29 AM)    meloxicam (MOBIC) 7.5 MG tablet [id number] No Take 7.5 mg by mouth daily.  Patient not taking: Reported on 06/03/2021   [provider] Not Taking Active   Multiple Vitamins-Calcium (ONE-A-DAY WOMENS FORMULA PO) [id number] Yes Take 1 capsule by mouth daily. [provider] Taking Active   nortriptyline (PAMELOR) 25 MG capsule [id number] Yes Take 1 capsule (25 mg total) by mouth at bedtime. Jonathon Bellows, MD Taking Active   omeprazole (PRILOSEC) 40 MG capsule [id number] Yes TAKE 1 CAPSULE BY MOUTH IN THE MORNING AND AT BEDTIME Jonathon Bellows, MD Taking Active   Probiotic Product (PROBIOTIC DAILY PO) [id number] Yes Take by mouth. [provider] Taking Active   sucralfate (CARAFATE) 1 g tablet [id number] Yes Take 1 tablet (1 g total) by mouth 4 (four) times daily. Jonathon Bellows, MD Taking Active     Discontinued 04/03/21 0905   TURMERIC-GINGER PO [id number] Yes Take 1 each by  mouth daily. [provider] Taking Active   VITAMIN D, CHOLECALCIFEROL, PO [id number] Yes Take 5,000 mg by mouth daily at 12 noon. 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 12/07/2020   Insomnia 10/30/2020  Mild persistent asthma 10/30/2020   Esophagitis 10/30/2020   Brain fog 09/28/2020   Trochanteric bursitis of right hip 08/22/2020   Anxiety 06/15/2020   Overactive bladder 06/15/2020   Bronchospasm 06/15/2020   Allergic rhinitis 06/15/2020   Dysphagia 01/25/2020   H/O bilateral breast implants 10/28/2019   Chronic midline low back pain with right-sided sciatica 10/28/2019   Essential hypertension 10/28/2019   Breast pain, left 10/28/2019   Right hip pain 10/28/2019   Osteoporosis 10/28/2019   Shortness of breath 10/28/2019   S/P lumbar fusion 10/28/2019   History of colon polyps 10/28/2019   Hypothyroidism    Depression, recurrent ([place])     Immunization History  Administered Date(s) Administered   Hepb-cpg 12/06/2019, 02/06/2020   Influenza,inj,Quad PF,6+ Mos 10/13/2018, 09/23/2019, 07/03/2020   PFIZER(Purple Top)SARS-COV-2 Vaccination 12/24/2019, 01/15/2020, 09/05/2020   Pneumococcal Polysaccharide-23 10/13/2018    Conditions to be addressed/monitored: HTN and Pulmonary Disease  Care Plan : Medication Management  Updates made by De Hollingshead, RPH-CPP since 06/03/2021 12:00 AM     Problem: Reactive Airway, HTN, Dysphagia, Anxiety      Long-Range Goal: Disease Progression Prevention   Start Date: 10/29/2020  This Visit's Progress: On track  Recent Progress: On track   Priority: High  Note:   Current Barriers:  Unable to independently afford treatment regimen Unable to achieve control of dysphagia  Low level of health literacy in regard to medication indications  Pharmacist Clinical Goal(s):  Over the next 90 days, patient will verbalize ability to afford treatment regimen achieve adherence to monitoring guidelines and medication adherence to achieve therapeutic efficacy through collaboration with PharmD and provider.   Interventions: 1:1 collaboration with McLean-Scocuzza, Nino Glow, MD regarding development and update of comprehensive plan of care as evidenced by provider attestation and co-signature Inter-disciplinary care team collaboration (see longitudinal plan of care) Comprehensive medication review performed; medication list updated in electronic medical record  Health Maintenance Yearly influenza vaccination: due - encouraged yearly vaccine Td/Tdap vaccination: due - will discuss moving forward Pneumonia vaccination: up to date COVID vaccinations: due - recommend bivalent booster Shingrix vaccinations: due - will discuss moving forward Colonoscopy: up to date Bone density scan: up to date Mammogram: up to date  Hypertension: Controlled goal <130/80; current treatment: amlodipine 2.5 mg daily Home BP readings: reports they continue to be "in range" but does not remember numbers Recommended to continue current regimen at this time.  Asthma/Reactive Airway disease: Improved; current treatment: none Hx esophageal thrush, may have been related to inhaled and/or nasal steroid Also stopped albuterol HFA  Last PFT: indicates reactive airway disease.  Approved for [place] assistance, if needed in the future.  Continue to decline need for pharmacotherapy at this time.   Dysphagia, ?GERD: Uncontrolled; currently taking omeprazole 40 mg BID, famotidine 20 mg QPM, and sucralfate 1 g QID PRN  Reviewed to take omeprazole ~ 30 minutes before  a meal (breakfast and supper), famotidine before bed.  Discussed need to separate sucralfate by 30 minutes before, 2 hours after from other medications. She verbalizes understanding.  She reports frustration that she still has swallowing problems. Reviewed nonpharmacologic methods to reduce acid reflux, including reducing fried/fatty foods and avoiding laying down after eating.   Hypothyroidism: Controlled per last TSH; current regimen: levothyroxine 175 mcg daily Confirmed appropriate administration and discussed administering separately from vitamins and sucralfate Recommended to continue current regimen at this time  Bursitis, Chronic Hip/Back pain/Hand pin: Uncontrolled; current treatment: meloxicam 7.5 mg daily - though denies benefit,  cyclobenzaprine 5 mg from PCP at least once weekly - though unsure if benefit; follows w/ PM&R at  [facility], Dr. Holley Raring pain management Previously discussed long term renal and GI concerns with NSAIDs. Encouraged to only use meloxicam PRN, and only use if she feels benefit.  Previous encouraged to keep appointment with pain management and continue collaboration with PM&R.   OAB: Uncontrolled but patient tolerating; current treatment: none. Patient choosing to avoid medication as she worries about effect on brain fog Previously tried:Toviaz 8 mg- notes improvement in OAB symptoms but some headache at night; approved for [place] assistance for this through 09/21/21 Moving forward, could investigate insurance coverage for Myrbetriq/Gemtesa (though likely expensive and there is no patient assistance at this time) given lower incidence of anticholinergic effects.    Anxiety/Insomnia: Improved per patient report; current regimen: melatonin 1 mg QPM, nortriptyline 25 mg QPM (started by GI) Previously prescribed sertraline but chose not to start. Previously educated on potential benefit of treatment of depression per neurology if this is a contributing  factor Recommended to continue current regimen at this time. Continue to take QPM given sedative side effects. Appointment with Dr. Shea Evans tomorrow.   Osteoporosis: Untreated; current regimen: none Previous medications: believes she had been on alendronate (remembers a weekly pill that starts with a), knows she was on Prolia for 1-2 years, then had insurance coverage issues and reports she was switched to a yearly IV medication x 1 dose (likely zoledronic acid) before she moved down here Supplements: calcium citrate 600 mg + 1-2 servings of dairy (milk or yogurt) daily, vitamin D 5000 units daily Most recent DEXA 01/28/21: t score -3.9, severe osteoporosis; hx ankle fracture Patient's insurance. PA declined for [place]. Referral to endocrinology in place, scheduled in November. Patient continues to be concerned about being without therapy for several years. She notes she is on the cancellation list.  Continue daily calcium and vitamin D, and follow with endocrinology as scheduled.   Supplement: Vitamin C, Vitamin B12, Vitamin E, melatonin, multivitamin, probiotic, turmeric-ginger, omega 3 fatty acids Reviewed risks of high dose vitamin E. Advised that she may not clinically need all of the supplements she takes if she is interested in reducing pill burden.     Patient Goals/Self-Care Activities Over the next 90 days, patient will:  - take medications as prescribed focus on medication adherence by using pill box collaborate with provider on medication access solutions  Follow Up Plan: Telephone follow up appointment with care management team member scheduled for: ~ 12 weeks      Medication Assistance: None required.  Patient affirms current coverage meets needs.  Patient's preferred pharmacy is:  [facility] [address] [place] - [place] [address] Phone: [phone] Fax: [phone]   Follow Up:  Patient agrees to Care Plan and  Follow-up.  Plan: Telephone follow up appointment with care management team member scheduled for:  ~ 12 weeks  [name], PharmD, [place], [place] Clinical Pharmacist [employer] [facility] [employer] [phone]

## 2021-06-04 ENCOUNTER — Encounter: Payer: Self-pay | Admitting: Psychiatry

## 2021-06-04 ENCOUNTER — Other Ambulatory Visit: Payer: Self-pay

## 2021-06-04 ENCOUNTER — Telehealth (INDEPENDENT_AMBULATORY_CARE_PROVIDER_SITE_OTHER): Payer: Medicare Other | Admitting: Psychiatry

## 2021-06-04 VITALS — BP 133/88 | HR 75 | Wt 168.8 lb

## 2021-06-04 DIAGNOSIS — F411 Generalized anxiety disorder: Secondary | ICD-10-CM

## 2021-06-04 DIAGNOSIS — F4312 Post-traumatic stress disorder, chronic: Secondary | ICD-10-CM

## 2021-06-04 DIAGNOSIS — F09 Unspecified mental disorder due to known physiological condition: Secondary | ICD-10-CM | POA: Diagnosis not present

## 2021-06-04 DIAGNOSIS — F339 Major depressive disorder, recurrent, unspecified: Secondary | ICD-10-CM | POA: Diagnosis not present

## 2021-06-04 MED ORDER — NORTRIPTYLINE HCL 50 MG PO CAPS
50.0000 mg | ORAL_CAPSULE | Freq: Every day | ORAL | 1 refills | Status: DC
Start: 2021-06-04 — End: 2021-06-26
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
## 2021-06-04 NOTE — Patient Instructions (Signed)
Nortriptyline capsules What is this medication? NORTRIPTYLINE (nor TRIP ti leen) is used to treat depression. This medicine may be used for other purposes; ask your health care provider or pharmacist if you have questions. COMMON BRAND NAME(S): Aventyl, Pamelor What should I tell my care team before I take this medication? They need to know if you have any of these conditions: bipolar disorder Brugada syndrome difficulty passing urine glaucoma heart disease if you drink alcohol liver disease schizophrenia seizures suicidal thoughts, plans or attempt; a previous suicide attempt by you or a family member thyroid disease an unusual or allergic reaction to nortriptyline, other tricyclic antidepressants, other medicines, foods, dyes, or preservatives pregnant or trying to get pregnant breast-feeding How should I use this medication? Take this medicine by mouth with a glass of water. Follow the directions on the prescription label. Take your doses at regular intervals. Do not take it more often than directed. Do not stop taking this medicine suddenly except upon the advice of your doctor. Stopping this medicine too quickly may cause serious side effects or your condition may worsen. A special MedGuide will be given to you by the pharmacist with each prescription and refill. Be sure to read this information carefully each time. Talk to your pediatrician regarding the use of this medicine in children. Special care may be needed. Overdosage: If you think you have taken too much of this medicine contact a poison control center or emergency room at once. NOTE: This medicine is only for you. Do not share this medicine with others. What if I miss a dose? If you miss a dose, take it as soon as you can. If it is almost time for your next dose, take only that dose. Do not take double or extra doses. What may interact with this medication? Do not take this medicine with any of the following  medications: cisapride dronedarone linezolid MAOIs like Carbex, Eldepryl, Marplan, Nardil, and Parnate methylene blue (injected into a vein) pimozide thioridazine This medicine may also interact with the following medications: alcohol antihistamines for allergy, cough, and cold atropine certain medicines for bladder problems like oxybutynin, tolterodine certain medicines for depression like amitriptyline, fluoxetine, sertraline certain medicines for Parkinson's disease like benztropine, trihexyphenidyl certain medicines for stomach problems like dicyclomine, hyoscyamine certain medicines for travel sickness like scopolamine chlorpropamide cimetidine ipratropium other medicines that prolong the QT interval (an abnormal heart rhythm) like dofetilide other medicines that can cause serotonin syndrome like St. John's Wort, fentanyl, lithium, tramadol, tryptophan, buspirone, and some medicines for headaches like sumatriptan or rizatriptan quinidine reserpine thyroid medicine This list may not describe all possible interactions. Give your health care provider a list of all the medicines, herbs, non-prescription drugs, or dietary supplements you use. Also tell them if you smoke, drink alcohol, or use illegal drugs. Some items may interact with your medicine. What should I watch for while using this medication? Tell your doctor if your symptoms do not get better or if they get worse. Visit your doctor or health care professional for regular checks on your progress. Because it may take several weeks to see the full effects of this medicine, it is important to continue your treatment as prescribed by your doctor. Patients and their families should watch out for new or worsening thoughts of suicide or depression. Also watch out for sudden changes in feelings such as feeling anxious, agitated, panicky, irritable, hostile, aggressive, impulsive, severely restless, overly excited and hyperactive, or not  being able to sleep. If this happens,   especially at the beginning of treatment or after a change in dose, call your health care professional. You may get drowsy or dizzy. Do not drive, use machinery, or do anything that needs mental alertness until you know how this medicine affects you. Do not stand or sit up quickly, especially if you are an older patient. This reduces the risk of dizzy or fainting spells. Alcohol may interfere with the effect of this medicine. Avoid alcoholic drinks. Do not treat yourself for coughs, colds, or allergies without asking your doctor or health care professional for advice. Some ingredients can increase possible side effects. Your mouth may get dry. Chewing sugarless gum or sucking hard candy, and drinking plenty of water may help. Contact your doctor if the problem does not go away or is severe. This medicine may cause dry eyes and blurred vision. If you wear contact lenses you may feel some discomfort. Lubricating drops may help. See your eye doctor if the problem does not go away or is severe. This medicine can cause constipation. Try to have a bowel movement at least every 2 to 3 days. If you do not have a bowel movement for 3 days, call your doctor or health care professional. This medicine can make you more sensitive to the sun. Keep out of the sun. If you cannot avoid being in the sun, wear protective clothing and use sunscreen. Do not use sun lamps or tanning beds/booths. What side effects may I notice from receiving this medication? Side effects that you should report to your doctor or health care professional as soon as possible: allergic reactions like skin rash, itching or hives, swelling of the face, lips, or tongue anxious breathing problems changes in vision confusion elevated mood, decreased need for sleep, racing thoughts, impulsive behavior eye pain fast, irregular heartbeat feeling faint or lightheaded, falls feeling agitated, angry, or  irritable fever with increased sweating hallucination, loss of contact with reality seizures stiff muscles suicidal thoughts or other mood changes tingling, pain, or numbness in the feet or hands trouble passing urine or change in the amount of urine trouble sleeping unusually weak or tired vomiting yellowing of the eyes or skin Side effects that usually do not require medical attention (report to your doctor or health care professional if they continue or are bothersome): change in sex drive or performance change in appetite or weight constipation dizziness dry mouth nausea tired tremors upset stomach This list may not describe all possible side effects. Call your doctor for medical advice about side effects. You may report side effects to FDA at 1-800-FDA-1088. Where should I keep my medication? Keep out of the reach of children. Store at room temperature between 15 and 30 degrees C (59 and 86 degrees F). Keep container tightly closed. Throw away any unused medicine after the expiration date. NOTE: This sheet is a summary. It may not cover all possible information. If you have questions about this medicine, talk to your doctor, pharmacist, or health care provider.  2022 Elsevier/Gold Standard (2020-04-24 15:25:34)  

## 2021-06-05 ENCOUNTER — Ambulatory Visit: Payer: Medicare Other | Admitting: Family

## 2021-06-06 ENCOUNTER — Ambulatory Visit: Payer: Medicare Other

## 2021-06-06 ENCOUNTER — Telehealth: Payer: Self-pay

## 2021-06-06 NOTE — Telephone Encounter (Signed)
Called patient at schedule appointment time, no answer. Left message of reschedule.

## 2021-06-21 DIAGNOSIS — I10 Essential (primary) hypertension: Secondary | ICD-10-CM

## 2021-06-21 DIAGNOSIS — E039 Hypothyroidism, unspecified: Secondary | ICD-10-CM

## 2021-06-21 DIAGNOSIS — M810 Age-related osteoporosis without current pathological fracture: Secondary | ICD-10-CM | POA: Diagnosis not present

## 2021-06-25 ENCOUNTER — Ambulatory Visit: Payer: Medicare Other | Admitting: Licensed Clinical Social Worker

## 2021-06-25 ENCOUNTER — Other Ambulatory Visit: Payer: Self-pay

## 2021-06-25 NOTE — Progress Notes (Signed)
Attempted virtual OPT appt.  Pt stated at time of session "I don't like this at all--I don't feel comfortable talking to you on the screen".  Scheduled pt in person visit 11/9@1 .

## 2021-06-26 ENCOUNTER — Telehealth (INDEPENDENT_AMBULATORY_CARE_PROVIDER_SITE_OTHER): Payer: Medicare Other | Admitting: Psychiatry

## 2021-06-26 ENCOUNTER — Encounter: Payer: Self-pay | Admitting: Psychiatry

## 2021-06-26 DIAGNOSIS — F4312 Post-traumatic stress disorder, chronic: Secondary | ICD-10-CM

## 2021-06-26 DIAGNOSIS — F411 Generalized anxiety disorder: Secondary | ICD-10-CM

## 2021-06-26 DIAGNOSIS — F09 Unspecified mental disorder due to known physiological condition: Secondary | ICD-10-CM

## 2021-06-26 DIAGNOSIS — F339 Major depressive disorder, recurrent, unspecified: Secondary | ICD-10-CM | POA: Diagnosis not present

## 2021-06-26 MED ORDER — NORTRIPTYLINE HCL 25 MG PO CAPS: 25.0000 mg | ORAL_CAPSULE | Freq: Every day | ORAL | 1 refills | Status: DC
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
## 2021-07-16 ENCOUNTER — Telehealth: Payer: Self-pay | Admitting: Internal Medicine

## 2021-07-16 NOTE — Telephone Encounter (Signed)
Please advise, does Patient need to be seen or does she need a new referral?

## 2021-07-16 NOTE — Telephone Encounter (Signed)
Patient called in to schedule an appointment with Dr.Tracy to discuss her concerns about the GI doctor who she has been seeing.She feels that she is still having trouble with her throat and that the provider is not helping with her concerns.She is scheduled for an appointment on 11/17 with Dr.Tracy but would like to be seen sooner if possible.Please advise.

## 2021-07-16 NOTE — Telephone Encounter (Signed)
Does she want to see another GI doctor there is nothing else PCP can do from GI standpoint?   She will have to go to [facility]? Or can GI call pt and go over her concerns?

## 2021-07-17 ENCOUNTER — Encounter: Payer: Self-pay | Admitting: Gastroenterology

## 2021-07-17 ENCOUNTER — Ambulatory Visit: Payer: Medicare Other | Admitting: Gastroenterology

## 2021-07-17 NOTE — Telephone Encounter (Signed)
Called and informed Patient of the below. She is agreeable to a new referral and will await a call from GI to set this up

## 2021-07-24 DIAGNOSIS — E039 Hypothyroidism, unspecified: Secondary | ICD-10-CM | POA: Diagnosis not present

## 2021-07-24 DIAGNOSIS — M810 Age-related osteoporosis without current pathological fracture: Secondary | ICD-10-CM | POA: Diagnosis not present

## 2021-07-31 ENCOUNTER — Ambulatory Visit (INDEPENDENT_AMBULATORY_CARE_PROVIDER_SITE_OTHER): Payer: Medicare Other | Admitting: Licensed Clinical Social Worker

## 2021-07-31 DIAGNOSIS — F4312 Post-traumatic stress disorder, chronic: Secondary | ICD-10-CM

## 2021-07-31 NOTE — Progress Notes (Signed)
   THERAPIST PROGRESS NOTE  Session Time: 1-2p  Participation Level: Active  Behavioral Response: Neat and Well GroomedAlertAnxious  Type of Therapy: Individual Therapy  Treatment Goals addressed:   Interventions:   Summary: [name] is a 65 y.o. female who presents with symptoms consistent with PTSD diagnosis. Pt reports that overall mood is stable and that she tries very hard to use her coping skills to manage situational stressors.   Reviewed coping skills and encouraged pt to continue using skills that she has found to be helpful. Pt reports that she engages socially with friends on a regular basis, goes for walks on a regular basis, and is mindful about eating/nutrition. Pt feels good about her overall wellness.   Pt reports that she finds medical care in [place] is different than care she received in [place] and is happy with current doctors that she is seeing. Pt is skeptical about medication and would prefer alternative options to meds if possible.   Assessment info; development of tx plans .   Suicidal/Homicidal: No  Therapist Response: Initial assessment, development of tx plan.   Plan: Return again in 4 weeks.  Diagnosis: Axis I: PTSD    Axis II: No diagnosis    Ernest Haber Jasmine Gubser, LCSW 07/31/2021
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
## 2021-07-31 NOTE — Plan of Care (Signed)
Developed plan w/ pt input

## 2021-08-07 ENCOUNTER — Telehealth: Payer: Self-pay | Admitting: Psychiatry

## 2021-08-07 ENCOUNTER — Encounter: Payer: Self-pay | Admitting: Psychiatry

## 2021-08-07 NOTE — Telephone Encounter (Signed)
Received message from front desk that patient requesting a letter that shows that she has a mental health diagnosis.  We will print out the letter and we will have Shanda Bumps CMA contact this patient.

## 2021-08-08 ENCOUNTER — Telehealth (INDEPENDENT_AMBULATORY_CARE_PROVIDER_SITE_OTHER): Payer: Medicare Other | Admitting: Internal Medicine

## 2021-08-08 ENCOUNTER — Other Ambulatory Visit: Payer: Self-pay

## 2021-08-08 ENCOUNTER — Encounter: Payer: Self-pay | Admitting: Internal Medicine

## 2021-08-08 DIAGNOSIS — F339 Major depressive disorder, recurrent, unspecified: Secondary | ICD-10-CM

## 2021-08-08 DIAGNOSIS — F4312 Post-traumatic stress disorder, chronic: Secondary | ICD-10-CM

## 2021-08-08 DIAGNOSIS — F419 Anxiety disorder, unspecified: Secondary | ICD-10-CM

## 2021-08-08 DIAGNOSIS — R1310 Dysphagia, unspecified: Secondary | ICD-10-CM

## 2021-08-08 DIAGNOSIS — G4700 Insomnia, unspecified: Secondary | ICD-10-CM

## 2021-08-08 DIAGNOSIS — M79642 Pain in left hand: Secondary | ICD-10-CM

## 2021-08-08 DIAGNOSIS — M25532 Pain in left wrist: Secondary | ICD-10-CM
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
## 2021-08-12 ENCOUNTER — Encounter: Payer: Self-pay | Admitting: Internal Medicine

## 2021-08-12 DIAGNOSIS — F32A Depression, unspecified: Secondary | ICD-10-CM | POA: Insufficient documentation

## 2021-08-13 ENCOUNTER — Other Ambulatory Visit: Payer: Self-pay | Admitting: Gastroenterology

## 2021-08-16 ENCOUNTER — Telehealth: Payer: Self-pay | Admitting: Pulmonary Disease

## 2021-08-16 NOTE — Telephone Encounter (Signed)
Received re enrollment application request from [employer].  Spoke to patient. Patient stated that she is not currently taking any inhalers. She is past due for OV. She stated that she would call back to schedule OV if needed but at this time her breathing is doing well.  Nothing further needed at this time.

## 2021-08-21 ENCOUNTER — Telehealth: Payer: Self-pay | Admitting: Internal Medicine

## 2021-08-21 ENCOUNTER — Telehealth (HOSPITAL_BASED_OUTPATIENT_CLINIC_OR_DEPARTMENT_OTHER): Payer: Medicare Other | Admitting: Student in an Organized Health Care Education/Training Program

## 2021-08-21 DIAGNOSIS — G5701 Lesion of sciatic nerve, right lower limb: Secondary | ICD-10-CM

## 2021-08-21 NOTE — Telephone Encounter (Signed)
Pt was previously referred to an orthopedic doctor for her hand. Pt states she was given a brace at first for it however now the pain is increasing and she wants to know who she was referred to so that she can go back to them about her pain. Pt states the pain is now beginning to shoot up through her shoulder.    Pt was advised we would call her back with the correct information because I was unable to help her figure out who it was that she seen previously.

## 2021-08-22 NOTE — Progress Notes (Signed)
No appt 

## 2021-08-22 NOTE — Telephone Encounter (Signed)
Called and informed the Patient that she was referred to Fredderick Erb, MD with [facility]. Given the phone number [phone] to call.

## 2021-08-26 DIAGNOSIS — S66912A Strain of unspecified muscle, fascia and tendon at wrist and hand level, left hand, initial encounter: Secondary | ICD-10-CM | POA: Diagnosis not present

## 2021-08-26 DIAGNOSIS — S66911A Strain of unspecified muscle, fascia and tendon at wrist and hand level, right hand, initial encounter: Secondary | ICD-10-CM | POA: Diagnosis not present

## 2021-08-27 ENCOUNTER — Telehealth: Payer: Medicare Other

## 2021-09-02 ENCOUNTER — Other Ambulatory Visit: Payer: Self-pay

## 2021-09-02 ENCOUNTER — Ambulatory Visit (INDEPENDENT_AMBULATORY_CARE_PROVIDER_SITE_OTHER): Payer: Medicare Other | Admitting: Licensed Clinical Social Worker

## 2021-09-02 ENCOUNTER — Ambulatory Visit
Admission: RE | Admit: 2021-09-02 | Discharge: 2021-09-02 | Disposition: A | Discharge status: Home / Self Care | Payer: Medicare Other | Source: Ambulatory Visit | Attending: Student in an Organized Health Care Education/Training Program | Admitting: Student in an Organized Health Care Education/Training Program

## 2021-09-02 ENCOUNTER — Encounter: Payer: Self-pay | Admitting: Student in an Organized Health Care Education/Training Program

## 2021-09-02 ENCOUNTER — Ambulatory Visit (HOSPITAL_BASED_OUTPATIENT_CLINIC_OR_DEPARTMENT_OTHER): Payer: Medicare Other | Admitting: Student in an Organized Health Care Education/Training Program

## 2021-09-02 VITALS — BP 121/90 | HR 73 | Temp 97.4°F | Resp 19 | Ht 65.0 in | Wt 168.0 lb

## 2021-09-02 DIAGNOSIS — M533 Sacrococcygeal disorders, not elsewhere classified: Secondary | ICD-10-CM

## 2021-09-02 DIAGNOSIS — M47818 Spondylosis without myelopathy or radiculopathy, sacral and sacrococcygeal region: Secondary | ICD-10-CM | POA: Diagnosis not present

## 2021-09-02 DIAGNOSIS — G5701 Lesion of sciatic nerve, right lower limb: Secondary | ICD-10-CM | POA: Insufficient documentation

## 2021-09-02 DIAGNOSIS — Z79890 Hormone replacement therapy (postmenopausal): Secondary | ICD-10-CM | POA: Insufficient documentation

## 2021-09-02 DIAGNOSIS — G894 Chronic pain syndrome: Secondary | ICD-10-CM

## 2021-09-02 DIAGNOSIS — Z791 Long term (current) use of non-steroidal anti-inflammatories (NSAID): Secondary | ICD-10-CM | POA: Insufficient documentation

## 2021-09-02 DIAGNOSIS — F4312 Post-traumatic stress disorder, chronic: Secondary | ICD-10-CM

## 2021-09-02 DIAGNOSIS — M549 Dorsalgia, unspecified: Secondary | ICD-10-CM | POA: Diagnosis not present

## 2021-09-02 DIAGNOSIS — Z79899 Other long term (current) drug therapy: Secondary | ICD-10-CM | POA: Diagnosis not present

## 2021-09-02 DIAGNOSIS — G8929 Other chronic pain: Secondary | ICD-10-CM

## 2021-09-02 DIAGNOSIS — M461 Sacroiliitis, not elsewhere classified: Secondary | ICD-10-CM

## 2021-09-02 MED ORDER — METHYLPREDNISOLONE ACETATE 40 MG/ML IJ SUSP
40.0000 mg | Freq: Once | INTRAMUSCULAR | Status: AC
  Administered 2021-09-02: 40 mg via INTRA_ARTICULAR

## 2021-09-02 MED ORDER — LIDOCAINE HCL 2 % IJ SOLN
20.0000 mL | Freq: Once | INTRAMUSCULAR | Status: AC
  Administered 2021-09-02: 400 mg

## 2021-09-02 MED ORDER — ROPIVACAINE HCL 2 MG/ML IJ SOLN
INTRAMUSCULAR | Status: AC
  Filled 2021-09-02: qty 20

## 2021-09-02 MED ORDER — ROPIVACAINE HCL 2 MG/ML IJ SOLN
9.0000 mL | Freq: Once | INTRAMUSCULAR | Status: AC
  Administered 2021-09-02: 9 mL via PERINEURAL

## 2021-09-02 MED ORDER — LIDOCAINE HCL 2 % IJ SOLN
INTRAMUSCULAR | Status: AC
  Filled 2021-09-02: qty 20

## 2021-09-02 MED ORDER — DEXAMETHASONE SODIUM PHOSPHATE 10 MG/ML IJ SOLN
10.0000 mg | Freq: Once | INTRAMUSCULAR | Status: AC
  Administered 2021-09-02: 10 mg

## 2021-09-02 MED ORDER — DEXAMETHASONE SODIUM PHOSPHATE 10 MG/ML IJ SOLN
INTRAMUSCULAR | Status: AC
  Filled 2021-09-02: qty 1

## 2021-09-02 MED ORDER — IOHEXOL 180 MG/ML  SOLN
10.0000 mL | Freq: Once | INTRAMUSCULAR | Status: AC
  Administered 2021-09-02: 5 mL via INTRA_ARTICULAR

## 2021-09-02 MED ORDER — METHYLPREDNISOLONE ACETATE 40 MG/ML IJ SUSP
INTRAMUSCULAR | Status: AC
  Filled 2021-09-02: qty 1

## 2021-09-02 NOTE — Plan of Care (Signed)
  Problem: Reduce the negative impact trauma related symptoms have on social, occupational, and family functioning. Goal: LTG: Reduce frequency, intensity, and duration of PTSD symptoms so daily functioning is improved: Input needed on appropriate metric.  pt self report Outcome: Progressing Note: Pt had recent loss of pet but is coping well Goal: STG: Jasiyah WILL PRACTICE EMOTION REGULATION SKILLS 7 PER WEEK FOR THE NEXT 16 WEEKS Outcome: Progressing Intervention: Work with patient to identify the major components of a recent episode of anxiety: physical symptoms, major thoughts and images, and major behaviors they experienced Intervention: Educate patient on: Stress management

## 2021-09-02 NOTE — Progress Notes (Signed)
Safety precautions to be maintained throughout the outpatient stay will include: orient to surroundings, keep bed in low position, maintain call bell within reach at all times, provide assistance with transfer out of bed and ambulation.  

## 2021-09-02 NOTE — Progress Notes (Signed)
PROVIDER NOTE: Information contained herein reflects review and annotations entered in association with encounter. Interpretation of such information and data should be left to medically-trained personnel. Information provided to patient can be located elsewhere in the medical record under "Patient Instructions". Document created using STT-dictation technology, any transcriptional errors that may result from process are unintentional.    Patient: [name]  Service Category: Procedure  Provider: Edward Jolly, MD  DOB: Jan 25, 1956  DOS: 09/02/2021  Location: [facility]  MRN: [id number]  Setting: Ambulatory - outpatient  Referring Provider: McLean-Scocuzza, French Ana *  Type: Established Patient  Specialty: Interventional Pain Management  PCP: McLean-Scocuzza, Pasty Spillers, MD   Primary Reason for Visit: Interventional Pain Management Treatment. CC: Back Pain (right)  Procedure:          Anesthesia, Analgesia, Anxiolysis:  Type: Therapeutic Sacroiliac Joint Steroid Injection #2  & Right Piriformis Trigger point injection  under fluoroscopy #2  Region: Inferior Lumbosacral Region Level: PIIS (Posterior Inferior Iliac Spine) Laterality: Right-Side  Type: Local Anesthesia  Local Anesthetic: Lidocaine 1-2%  Position: Prone           Indications: 1. Piriformis syndrome, right   2. SI joint arthritis   3. Chronic right SI joint pain   4. Chronic pain syndrome    Pain Score: Pre-procedure: 9 /10 Post-procedure: 0-No pain/10   Pre-op H&P Assessment:  Ms. Gorr is a 65 y.o. (year old), female patient, seen today for interventional treatment. She  has a past surgical history that includes Back surgery; Breast surgery; Augmentation mammaplasty (Bilateral, 2006); Esophagogastroduodenoscopy (egd) with propofol (N/A, 10/24/2020); Esophagogastroduodenoscopy (egd) with propofol (N/A, 12/20/2020); Esophageal manometry (N/A, 03/04/2021); 24 hour ph study (N/A, 03/04/2021); and PH impedance study  (N/A, 03/04/2021). Ms. Golomb has a current medication list which includes the following prescription(s): amlodipine, ascorbic acid, calcium citrate, cyanocobalamin, cyclobenzaprine, famotidine, levothyroxine, melatonin, meloxicam, multiple vitamins-calcium, nortriptyline, omega-3, omeprazole, probiotic product, turmeric-ginger, cholecalciferol, vitamin e, meloxicam, and [DISCONTINUED] trazodone. Her primarily concern today is the Back Pain (right)  Initial Vital Signs:  Pulse/HCG Rate: 74  Temp: (!) 97.4 F (36.3 C) Resp: 16 BP: 115/89 SpO2: 100 %  BMI: Estimated body mass index is 27.96 kg/m as calculated from the following:   Height as of this encounter: 5\' 5"  (1.651 m).   Weight as of this encounter: 168 lb (76.2 kg).  Risk Assessment: Allergies: Reviewed. She has No Known Allergies.  Allergy Precautions: None required Coagulopathies: Reviewed. None identified.  Blood-thinner therapy: None at this time Active Infection(s): Reviewed. None identified. Ms. Hartinger is afebrile  Site Confirmation: Ms. Pilgrim was asked to confirm the procedure and laterality before marking the site Procedure checklist: Completed Consent: Before the procedure and under the influence of no sedative(s), amnesic(s), or anxiolytics, the patient was informed of the treatment options, risks and possible complications. To fulfill our ethical and legal obligations, as recommended by the American Medical Association's Code of Ethics, I have informed the patient of my clinical impression; the nature and purpose of the treatment or procedure; the risks, benefits, and possible complications of the intervention; the alternatives, including doing nothing; the risk(s) and benefit(s) of the alternative treatment(s) or procedure(s); and the risk(s) and benefit(s) of doing nothing. The patient was provided information about the general risks and possible complications associated with the procedure. These may include, but are not  limited to: failure to achieve desired goals, infection, bleeding, organ or nerve damage, allergic reactions, paralysis, and death. In addition, the patient was informed of those  risks and complications associated to the procedure, such as failure to decrease pain; infection; bleeding; organ or nerve damage with subsequent damage to sensory, motor, and/or autonomic systems, resulting in permanent pain, numbness, and/or weakness of one or several areas of the body; allergic reactions; (i.e.: anaphylactic reaction); and/or death. Furthermore, the patient was informed of those risks and complications associated with the medications. These include, but are not limited to: allergic reactions (i.e.: anaphylactic or anaphylactoid reaction(s)); adrenal axis suppression; blood sugar elevation that in diabetics may result in ketoacidosis or comma; water retention that in patients with history of congestive heart failure may result in shortness of breath, pulmonary edema, and decompensation with resultant heart failure; weight gain; swelling or edema; medication-induced neural toxicity; particulate matter embolism and blood vessel occlusion with resultant organ, and/or nervous system infarction; and/or aseptic necrosis of one or more joints. Finally, the patient was informed that Medicine is not an exact science; therefore, there is also the possibility of unforeseen or unpredictable risks and/or possible complications that may result in a catastrophic outcome. The patient indicated having understood very clearly. We have given the patient no guarantees and we have made no promises. Enough time was given to the patient to ask questions, all of which were answered to the patient's satisfaction. Ms. Steveson has indicated that she wanted to continue with the procedure. Attestation: I, the ordering provider, attest that I have discussed with the patient the benefits, risks, side-effects, alternatives, likelihood of achieving  goals, and potential problems during recovery for the procedure that I have provided informed consent. Date  Time: 09/02/2021 10:16 AM  Pre-Procedure Preparation:  Monitoring: As per clinic protocol. Respiration, ETCO2, SpO2, BP, heart rate and rhythm monitor placed and checked for adequate function Safety Precautions: Patient was assessed for positional comfort and pressure points before starting the procedure. Time-out: I initiated and conducted the "Time-out" before starting the procedure, as per protocol. The patient was asked to participate by confirming the accuracy of the "Time Out" information. Verification of the correct person, site, and procedure were performed and confirmed by me, the nursing staff, and the patient. "Time-out" conducted as per Joint Commission's Universal Protocol (UP.01.01.01). Time: 1053  Description of Procedure:          Target Area: Superior, posterior, aspect of the sacroiliac fissure Approach: Posterior, paraspinal, ipsilateral approach. Area Prepped: Entire Lower Lumbosacral Region DuraPrep (Iodine Povacrylex [0.7% available iodine] and Isopropyl Alcohol, 74% w/w) Safety Precautions: Aspiration looking for blood return was conducted prior to all injections. At no point did we inject any substances, as a needle was being advanced. No attempts were made at seeking any paresthesias. Safe injection practices and needle disposal techniques used. Medications properly checked for expiration dates. SDV (single dose vial) medications used. Description of the Procedure: Protocol guidelines were followed. The patient was placed in position over the procedure table. The target area was identified and the area prepped in the usual manner. Skin & deeper tissues infiltrated with local anesthetic. Appropriate amount of time allowed to pass for local anesthetics to take effect. The procedure needle was advanced under fluoroscopic guidance into the sacroiliac joint until a firm  endpoint was obtained. Proper needle placement secured. Negative aspiration confirmed. Solution injected in intermittent fashion, asking for systemic symptoms every 0.5cc of injectate. The needles were then removed and the area cleansed, making sure to leave some of the prepping solution back to take advantage of its long term bactericidal properties. Vitals:   09/02/21 1026 09/02/21 1048 09/02/21  1058  BP: 115/89 117/90 121/90  Pulse: 74 74 73  Resp: 16 13 19   Temp: (!) 97.4 F (36.3 C)    SpO2: 100% 99% 98%  Weight: 168 lb (76.2 kg)    Height: 5\' 5"  (1.651 m)      Start Time: 1053 hrs. End Time: 1058 hrs. Materials:  Needle(s) Type: Spinal Needle Gauge: 25G Length: 3.5-in Medication(s): Please see orders for medications and dosing details. 5 cc solution made of 4 cc of 0.2% ropivacaine, 1 cc of methylprednisolone, 40 mg/cc.  Injected into the right SI joint after contrast confirmation.  A right piriformis trigger point injection was also performed under fluoroscopy.  1 cm deep, 1 cm inferior and 1 cm lateral to the inferior SI joint fissure, the piriformis muscle was injected with contrast, muscle striations confirmed, no pain or paresthesias down right lower extremity.  5 cc solution made of 4 cc of 0.2% ropivacaine, 1 cc of Decadron:,10 mg/cc was injected into the right piriformis muscle.  Imaging Guidance (Non-Spinal):          Type of Imaging Technique: Fluoroscopy Guidance (Non-Spinal) Indication(s): Assistance in needle guidance and placement for procedures requiring needle placement in or near specific anatomical locations not easily accessible without such assistance. Exposure Time: Please see nurses notes. Contrast: Before injecting any contrast, we confirmed that the patient did not have an allergy to iodine, shellfish, or radiological contrast. Once satisfactory needle placement was completed at the desired level, radiological contrast was injected. Contrast injected under  live fluoroscopy. No contrast complications. See chart for type and volume of contrast used. Fluoroscopic Guidance: I was personally present during the use of fluoroscopy. "Tunnel Vision Technique" used to obtain the best possible view of the target area. Parallax error corrected before commencing the procedure. "Direction-depth-direction" technique used to introduce the needle under continuous pulsed fluoroscopy. Once target was reached, antero-posterior, oblique, and lateral fluoroscopic projection used confirm needle placement in all planes. Images permanently stored in EMR. Interpretation: I personally interpreted the imaging intraoperatively. Adequate needle placement confirmed in multiple planes. Appropriate spread of contrast into desired area was observed. No evidence of afferent or efferent intravascular uptake. Permanent images saved into the patient's record.   Post-operative Assessment:  Post-procedure Vital Signs:  Pulse/HCG Rate: 73 (nsr)  Temp: (!) 97.4 F (36.3 C) Resp: 19 BP: 121/90 SpO2: 98 %  EBL: None  Complications: No immediate post-treatment complications observed by team, or reported by patient.  Note: The patient tolerated the entire procedure well. A repeat set of vitals were taken after the procedure and the patient was kept under observation following institutional policy, for this type of procedure. Post-procedural neurological assessment was performed, showing return to baseline, prior to discharge. The patient was provided with post-procedure discharge instructions, including a section on how to identify potential problems. Should any problems arise concerning this procedure, the patient was given instructions to immediately contact , at any time, without hesitation. In any case, we plan to contact the patient by telephone for a follow-up status report regarding this interventional procedure.  Comments:  No additional relevant information.  Plan of Care   Orders:  Orders Placed This Encounter  Procedures   DG PAIN CLINIC C-ARM 1-60 MIN NO REPORT    Intraoperative interpretation by procedural physician at [facility].    Standing Status:   Standing    Number of Occurrences:   1    Order Specific Question:   Reason for exam:    Answer:  Assistance in needle guidance and placement for procedures requiring needle placement in or near specific anatomical locations not easily accessible without such assistance.      Medications ordered for procedure: Meds ordered this encounter  Medications   iohexol (OMNIPAQUE) 180 MG/ML injection 10 mL    Must be Myelogram-compatible. If not available, you may substitute with a water-soluble, non-ionic, hypoallergenic, myelogram-compatible radiological contrast medium.   lidocaine (XYLOCAINE) 2 % (with pres) injection 400 mg   dexamethasone (DECADRON) injection 10 mg   ropivacaine (PF) 2 mg/mL (0.2%) (NAROPIN) injection 9 mL   methylPREDNISolone acetate (DEPO-MEDROL) injection 40 mg    Medications administered: We administered iohexol, lidocaine, dexamethasone, ropivacaine (PF) 2 mg/mL (0.2%), and methylPREDNISolone acetate.  See the medical record for exact dosing, route, and time of administration.  Follow-up plan:   Return in about 6 weeks (around 10/14/2021) for Post Procedure Evaluation, virtual.     Right SI-J and Right Piriformis 04/17/21, 09/02/21    Recent Visits No visits were found meeting these conditions. Showing recent visits within past 90 days and meeting all other requirements Today's Visits Date Type Provider Dept  09/02/21 Procedure visit Edward Jolly, MD [facility]  Showing today's visits and meeting all other requirements Future Appointments Date Type Provider Dept  10/03/21 Appointment Edward Jolly, MD [facility]  Showing future appointments within next 90 days and meeting all other requirements Disposition: Discharge home  Discharge  (Date  Time): 09/02/2021;   hrs.   Primary Care Physician: McLean-Scocuzza, Pasty Spillers, MD Location: [facility] Note by: Edward Jolly, MD Date: 09/02/2021; Time: 11:16 AM  Disclaimer:  Medicine is not an exact science. The only guarantee in medicine is that nothing is guaranteed. It is important to note that the decision to proceed with this intervention was based on the information collected from the patient. The Data and conclusions were drawn from the patient's questionnaire, the interview, and the physical examination. Because the information was provided in large part by the patient, it cannot be guaranteed that it has not been purposely or unconsciously manipulated. Every effort has been made to obtain as much relevant data as possible for this evaluation. It is important to note that the conclusions that lead to this procedure are derived in large part from the available data. Always take into account that the treatment will also be dependent on availability of resources and existing treatment guidelines, considered by other Pain Management Practitioners as being common knowledge and practice, at the time of the intervention. For Medico-Legal purposes, it is also important to point out that variation in procedural techniques and pharmacological choices are the acceptable norm. The indications, contraindications, technique, and results of the above procedure should only be interpreted and judged by a Board-Certified Interventional Pain Specialist with extensive familiarity and expertise in the same exact procedure and technique.

## 2021-09-02 NOTE — Progress Notes (Signed)
Virtual Visit via Video Note  I connected with JAELEEN INZUNZA on 09/02/21 at  8:00 AM EST by a video enabled telemedicine application and verified that I am speaking with the correct person using two identifiers.  Location: Patient: home Provider: remote office [place], [place])   I discussed the limitations of evaluation and management by telemedicine and the availability of in person appointments. The patient expressed understanding and agreed to proceed.  I discussed the assessment and treatment plan with the patient. The patient was provided an opportunity to ask questions and all were answered. The patient agreed with the plan and demonstrated an understanding of the instructions.   The patient was advised to call back or seek an in-person evaluation if the symptoms worsen or if the condition fails to improve as anticipated.  I provided 20 minutes of non-face-to-face time during this encounter.   Ashlei Chinchilla R Pruitt Taboada, LCSW   THERAPIST PROGRESS NOTE  Session Time: 8-820a  Participation Level: Active  Behavioral Response: Neat and Well GroomedAlertAnxious and Depressed  Type of Therapy: Individual Therapy  Treatment Goals addressed:  Problem: Reduce the negative impact trauma related symptoms have on social, occupational, and family functioning.  Goal: LTG: Reduce frequency, intensity, and duration of PTSD symptoms so daily functioning is improved: Input needed on appropriate metric.  pt self report Outcome: Progressing Note: Pt had recent loss of pet but is coping well  Goal: STG: [facility] WILL PRACTICE EMOTION REGULATION SKILLS 7 PER WEEK FOR THE NEXT 16 WEEKS Outcome: Progressing  Interventions:   Intervention: Work with patient to identify the major components of a recent episode of anxiety: physical symptoms, major thoughts and images, and major behaviors they experienced  Intervention: Educate patient on: Stress management  Summary: DYLYNN KETNER is a 65 y.o. female  who presents with symptoms consistent with PTSD. Pt reports that overall mood has been fluctuating after an acute external stressor--pt had to put her dog to sleep.    Allowed pt to explore and express thoughts and feelings associated with recent life situations and external stressors. Allowed pt to share the experience of losing her dog, and overall psychological impact afterwards. Discussed how her children have been supportive of her and how she is making plans to visit them for the holidays and new year. Encouraged pt to focus on stress management techniques including self care and increasing in physical activity. Discussed grief/loss management.   Continued recommendations are as follows: self care behaviors, positive social engagements, focusing on overall work/home/life balance, and focusing on positive physical and emotional wellness.   Suicidal/Homicidal: No  Therapist Response: Pt is continuing to apply interventions learned in session into daily life situations. Pt is currently on track to meet goals utilizing interventions mentioned above. Personal growth and progress noted. Treatment to continue as indicated.   Plan: Return again in 4 weeks.  Diagnosis: Axis I: PTSD    Axis II: No diagnosis    Ernest Haber Lian Pounds, LCSW 09/02/2021

## 2021-09-03 ENCOUNTER — Telehealth: Payer: Self-pay | Admitting: *Deleted

## 2021-09-03 NOTE — Telephone Encounter (Signed)
No problems post procedure. 

## 2021-09-06 DIAGNOSIS — M810 Age-related osteoporosis without current pathological fracture: Secondary | ICD-10-CM | POA: Diagnosis not present

## 2021-09-11 ENCOUNTER — Ambulatory Visit: Payer: Medicare Other | Admitting: Licensed Clinical Social Worker

## 2021-09-16 DIAGNOSIS — Z20828 Contact with and (suspected) exposure to other viral communicable diseases: Secondary | ICD-10-CM | POA: Diagnosis not present

## 2021-09-16 DIAGNOSIS — Z20822 Contact with and (suspected) exposure to covid-19: Secondary | ICD-10-CM | POA: Diagnosis not present

## 2021-09-16 DIAGNOSIS — R5383 Other fatigue: Secondary | ICD-10-CM | POA: Diagnosis not present

## 2021-09-22 DIAGNOSIS — I639 Cerebral infarction, unspecified: Secondary | ICD-10-CM

## 2021-09-22 HISTORY — DX: Cerebral infarction, unspecified: I63.9

## 2021-09-25 ENCOUNTER — Ambulatory Visit
Admission: EM | Admit: 2021-09-25 | Discharge: 2021-09-25 | Disposition: A | Discharge status: Home / Self Care | Payer: Medicare Other | Attending: Emergency Medicine | Admitting: Emergency Medicine

## 2021-09-25 ENCOUNTER — Other Ambulatory Visit: Payer: Self-pay

## 2021-09-25 ENCOUNTER — Encounter: Payer: Self-pay | Admitting: Emergency Medicine

## 2021-09-25 DIAGNOSIS — J069 Acute upper respiratory infection, unspecified: Secondary | ICD-10-CM | POA: Diagnosis not present

## 2021-09-25 DIAGNOSIS — Z20822 Contact with and (suspected) exposure to covid-19: Secondary | ICD-10-CM

## 2021-09-25 DIAGNOSIS — Z1152 Encounter for screening for COVID-19: Secondary | ICD-10-CM | POA: Diagnosis not present

## 2021-09-25 MED ORDER — BENZONATATE 100 MG PO CAPS: 100.0000 mg | ORAL_CAPSULE | Freq: Three times a day (TID) | ORAL | 0 refills | Status: DC | PRN

## 2021-09-25 NOTE — ED Triage Notes (Signed)
Pt presents with cough and chest congestion x 9 days. At home covid test negative.

## 2021-09-25 NOTE — Discharge Instructions (Addendum)
Your COVID test is pending.  You should self quarantine until the test result is back.    Take the Tessalon Perles as needed for cough.  Take Tylenol as needed for fever or discomfort.  Rest and keep yourself hydrated.    Follow-up with your primary care provider if your symptoms are not improving.     
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
## 2021-09-26 ENCOUNTER — Emergency Department
Admission: EM | Admit: 2021-09-26 | Discharge: 2021-09-26 | Disposition: A | Discharge status: Home / Self Care | Payer: Medicare Other | Attending: Emergency Medicine | Admitting: Emergency Medicine

## 2021-09-26 ENCOUNTER — Emergency Department: Payer: Medicare Other

## 2021-09-26 ENCOUNTER — Other Ambulatory Visit: Payer: Self-pay

## 2021-09-26 DIAGNOSIS — R059 Cough, unspecified: Secondary | ICD-10-CM | POA: Diagnosis not present

## 2021-09-26 DIAGNOSIS — Z20822 Contact with and (suspected) exposure to covid-19: Secondary | ICD-10-CM | POA: Diagnosis not present

## 2021-09-26 DIAGNOSIS — J101 Influenza due to other identified influenza virus with other respiratory manifestations: Secondary | ICD-10-CM | POA: Diagnosis not present

## 2021-09-26 LAB — RESP PANEL BY RT-PCR (FLU A&B, COVID) ARPGX2
Influenza A by PCR: POSITIVE — AB
Influenza B by PCR: NEGATIVE
SARS Coronavirus 2 by RT PCR: NEGATIVE

## 2021-09-26 LAB — SARS-COV-2, NAA 2 DAY TAT

## 2021-09-26 LAB — NOVEL CORONAVIRUS, NAA: SARS-CoV-2, NAA: NOT DETECTED

## 2021-09-26 MED ORDER — BUTALBITAL-APAP-CAFFEINE 50-325-40 MG PO TABS: 1.0000 | ORAL_TABLET | Freq: Four times a day (QID) | ORAL | 0 refills | Status: AC | PRN

## 2021-09-26 NOTE — ED Provider Triage Note (Signed)
Emergency Medicine Provider Triage Evaluation Note  [name] , a 66 y.o. female  was evaluated in triage.  Pt complains of chest is burning, cough and congestion, positive COVID exposure.  Took home test which was negative.  Review of Systems  Positive: Cough,, chest burning, no pain, headache worse with cough Negative: Fever, chills, vomiting, diarrhea  Physical Exam  BP (!) 142/106 (BP Location: Left Arm)    Pulse 98    Temp 99.8 F (37.7 C) (Oral)    Resp 17    Ht 5\' 5"  (1.651 m)    Wt 75.8 kg    SpO2 100%    BMI 27.79 kg/m  Gen:   Awake, no distress   Resp:  Normal effort  MSK:   Moves extremities without difficulty  Other:    Medical Decision Making  Medically screening exam initiated at 11:38 AM.  Appropriate orders placed.  GYNETH HUBKA was informed that the remainder of the evaluation will be completed by another provider, this initial triage assessment does not replace that evaluation, and the importance of remaining in the ED until their evaluation is complete.     Zacarias Pontes, PA-C 09/26/21 1139

## 2021-09-26 NOTE — ED Triage Notes (Signed)
Pt to ED for covid sx 12/26. Reports pain with cough.

## 2021-09-26 NOTE — ED Provider Notes (Signed)
° °  [facility] Provider Note    Event Date/Time   First MD Initiated Contact with Patient 09/26/21 1310     (approximate)   History   covid sx   HPI  [name] is a 66 y.o. female who presents with complaints of chills, body aches, sore throat, headache, cough for the last 4 to 5 days.  She thinks that she may have COVID.  She has received her vaccines.  She has had a flu shot as well.     Physical Exam   Triage Vital Signs: ED Triage Vitals  Enc Vitals Group     BP 09/26/21 1137 (!) 142/106     Pulse Rate 09/26/21 1137 98     Resp 09/26/21 1137 17     Temp 09/26/21 1137 99.8 F (37.7 C)     Temp Source 09/26/21 1137 Oral     SpO2 09/26/21 1137 100 %     Weight 09/26/21 1124 75.8 kg (167 lb)     Height 09/26/21 1124 1.651 m (5\' 5" )     Head Circumference --      Peak Flow --      Pain Score 09/26/21 1124 10     Pain Loc --      Pain Edu? --      Excl. in [place]? --     Most recent vital signs: Vitals:   09/26/21 1137  BP: (!) 142/106  Pulse: 98  Resp: 17  Temp: 99.8 F (37.7 C)  SpO2: 100%     General: Awake, no distress.  CV:  Good peripheral perfusion.  Resp:  Normal effort.  Abd:  No distention.  Other:     ED Results / Procedures / Treatments   Labs (all labs ordered are listed, but only abnormal results are displayed) Labs Reviewed  RESP PANEL BY RT-PCR (FLU A&B, COVID) ARPGX2 - Abnormal; Notable for the following components:      Result Value   Influenza A by PCR POSITIVE (*)    All other components within normal limits     EKG     RADIOLOGY Chest x-ray viewed by me, no pneumonia    PROCEDURES:  Critical Care performed:   Procedures   MEDICATIONS ORDERED IN ED: Medications - No data to display   IMPRESSION / MDM / ASSESSMENT AND PLAN / ED COURSE  I reviewed the triage vital signs and the nursing notes.  Patient's presentation is consistent with viral illness, both COVID and influenza are  prevalent in the community at this time.  Pending PCR,  Chest x-ray reviewed by me, is reassuring, no evidence of pneumonia  Influenza PCR has returned as positive  Patient is outside the window for Tamiflu Rx  Recommend supportive care, outpatient follow-up as needed.  Will prescribe Fioricet for headaches     FINAL CLINICAL IMPRESSION(S) / ED DIAGNOSES   Final diagnoses:  Influenza A     Rx / DC Orders   ED Discharge Orders          Ordered    butalbital-acetaminophen-caffeine (FIORICET) 50-325-40 MG tablet  Every 6 hours PRN        09/26/21 1314             Note:  This document was prepared using Dragon voice recognition software and may include unintentional dictation errors.   11/24/21, MD 09/26/21 [phone]

## 2021-09-27 ENCOUNTER — Encounter: Payer: Self-pay | Admitting: Internal Medicine

## 2021-10-02 ENCOUNTER — Telehealth: Payer: Self-pay | Admitting: Pharmacist

## 2021-10-02 ENCOUNTER — Telehealth: Payer: Medicare Other

## 2021-10-02 NOTE — Telephone Encounter (Signed)
°  Chronic Care Management   Note  10/02/2021 Name: [name] MRN: [id number] DOB: 1956-03-14   Attempted to contact patient for scheduled appointment for medication management support, she answered and then hung up. Called back, left HIPAA compliant message for patient to return my call at their convenience.    Plan: - If I do not hear back from the patient by end of business today, will collaborate with Care Guide to outreach to schedule follow up with me  [name], PharmD, [place], [place] Pharmacist [employer] [facility] [employer] [phone]

## 2021-10-03 ENCOUNTER — Other Ambulatory Visit: Payer: Self-pay

## 2021-10-03 ENCOUNTER — Encounter: Payer: Self-pay | Admitting: Student in an Organized Health Care Education/Training Program

## 2021-10-03 ENCOUNTER — Ambulatory Visit
Payer: Medicare Other | Attending: Student in an Organized Health Care Education/Training Program | Admitting: Student in an Organized Health Care Education/Training Program

## 2021-10-03 DIAGNOSIS — M47818 Spondylosis without myelopathy or radiculopathy, sacral and sacrococcygeal region: Secondary | ICD-10-CM | POA: Diagnosis not present

## 2021-10-03 DIAGNOSIS — M533 Sacrococcygeal disorders, not elsewhere classified: Secondary | ICD-10-CM | POA: Diagnosis not present

## 2021-10-03 DIAGNOSIS — G894 Chronic pain syndrome: Secondary | ICD-10-CM | POA: Diagnosis not present

## 2021-10-03 DIAGNOSIS — M461 Sacroiliitis, not elsewhere classified: Secondary | ICD-10-CM

## 2021-10-03 DIAGNOSIS — G8929 Other chronic pain: Secondary | ICD-10-CM

## 2021-10-03 DIAGNOSIS — G5701 Lesion of sciatic nerve, right lower limb: Secondary | ICD-10-CM | POA: Diagnosis not present

## 2021-10-03 NOTE — Telephone Encounter (Signed)
Pt aware and has been scheduled.

## 2021-10-03 NOTE — Progress Notes (Signed)
Patient: [name]  Service Category: E/M  Provider: Gillis Santa, MD  DOB: Mar 20, 1956  DOS: 10/03/2021  Location: Office  MRN: [id number]  Setting: Ambulatory outpatient  Referring Provider: McLean-Scocuzza, Olivia [name] *  Type: Established Patient  Specialty: Interventional Pain Management  PCP: McLean-Scocuzza, Nino Glow, MD  Location: Home  Delivery: TeleHealth     Virtual Encounter - Pain Management PROVIDER NOTE: Information contained herein reflects review and annotations entered in association with encounter. Interpretation of such information and data should be left to medically-trained personnel. Information provided to patient can be located elsewhere in the medical record under "Patient Instructions". Document created using STT-dictation technology, any transcriptional errors that may result from process are unintentional.    Contact & Pharmacy Preferred: [phone] Home: [phone] (home) Mobile: [phone] (mobile) E-mail: [email or web address], [place] [address] Phone: [phone] Fax: [phone]   Pre-screening  Ms. Lucado offered "in-person" vs "virtual" encounter. She indicated preferring virtual for this encounter.   Reason COVID-19*   Social distancing based on CDC and AMA recommendations.   I contacted Rosalee Kaufman on 10/03/2021 via telephone.      I clearly identified myself as Gillis Santa, MD. I verified that I was speaking with the correct person using two identifiers (Name: KIP KAUTZMAN, and date of birth: [birth year]).  Consent I sought verbal advanced consent from Rosalee Kaufman for virtual visit interactions. I informed Ms. Haverstick of possible security and privacy concerns, risks, and limitations associated with providing "not-in-person" medical evaluation and management services. I also informed Ms. Helgeson of the availability of "in-person" appointments. Finally, I informed her that  there would be a charge for the virtual visit and that she could be  personally, fully or partially, financially responsible for it. Ms. Happel expressed understanding and agreed to proceed.   Historic Elements   [name] is a 66 y.o. year old, female patient evaluated today after our last contact on 09/02/2021. [name]  has a past medical history of Anxiety, COVID-19 (07/08/2019), Depression, Hypothyroidism, PTSD (post-traumatic stress disorder), PTSD (post-traumatic stress disorder), and Wears glasses. She also  has a past surgical history that includes Back surgery; Breast surgery; Augmentation mammaplasty (Bilateral, 2006); Esophagogastroduodenoscopy (egd) with propofol (N/A, 10/24/2020); Esophagogastroduodenoscopy (egd) with propofol (N/A, 12/20/2020); Esophageal manometry (N/A, 03/04/2021); 24 hour ph study (N/A, 03/04/2021); and PH impedance study (N/A, 03/04/2021). [name] has a current medication list which includes the following prescription(s): amlodipine, ascorbic acid, benzonatate, butalbital-acetaminophen-caffeine, calcium citrate, cyanocobalamin, cyclobenzaprine, famotidine, levothyroxine, melatonin, meloxicam, multiple vitamins-calcium, nortriptyline, omega-3, omeprazole, probiotic product, turmeric-ginger, cholecalciferol, vitamin e, meloxicam, and [DISCONTINUED] trazodone. She  reports that she has never smoked. She has never used smokeless tobacco. She reports that she does not currently use alcohol after a past usage of about 1.0 standard drink per week. She reports that she does not use drugs. [name] has No Known Allergies.   HPI  Today, she is being contacted for a post-procedure assessment.   Post-procedure evaluation   Type: Therapeutic Sacroiliac Joint Steroid Injection #2  & Right Piriformis Trigger point injection  under fluoroscopy #2  Effectiveness:  Initial hour after procedure: 100 %  Subsequent 4-6 hours post-procedure: 100 %  Analgesia past initial 6  hours: 90 % (ongoing)  Ongoing improvement:  Analgesic:  90% Function: Ms. Marquina reports improvement in function ROM: Ms. Grandpre reports improvement in ROM   Laboratory Chemistry Profile   Renal Lab Results  Component Value Date   BUN 20 04/02/2021   CREATININE 0.66 04/02/2021   GFR 93.55 04/02/2021   GFRAA >60 10/04/2019   GFRNONAA >60 08/24/2020    Hepatic Lab Results  Component Value Date   AST 22 04/02/2021   ALT 21 04/02/2021   ALBUMIN 4.6 04/02/2021   ALKPHOS 81 04/02/2021    Electrolytes Lab Results  Component Value Date   NA 139 04/02/2021   K 4.2 04/02/2021   CL 103 04/02/2021   CALCIUM 10.0 04/02/2021    Bone Lab Results  Component Value Date   VD25OH 71.21 04/02/2021    Inflammation (CRP: Acute Phase) (ESR: Chronic Phase) No results found for: CRP, ESRSEDRATE, LATICACIDVEN       Note: Above Lab results reviewed.  Imaging  DG Chest 2 View CLINICAL DATA:  Cough and congestion.  Illness.  EXAM: CHEST - 2 VIEW  COMPARISON:  08/24/2020  FINDINGS: The heart size and mediastinal contours are within normal limits. Both lungs are clear. The visualized skeletal structures are unremarkable.  IMPRESSION: No active cardiopulmonary disease.  Electronically Signed   By: Franchot Gallo M.D.   On: 09/26/2021 11:58  Assessment  The primary encounter diagnosis was Piriformis syndrome, right. Diagnoses of SI joint arthritis, Chronic right SI joint pain, and Chronic pain syndrome were also pertinent to this visit.  Plan of Care  Good analgesic and functional response after previous injections Follow up for repeat PRN  Follow-up plan:   Return if symptoms worsen or fail to improve.     Right SI-J and Right Piriformis 04/17/21, 09/02/21     Recent Visits Date Type Provider Dept  09/02/21 Procedure visit Gillis Santa, MD [facility]  Showing recent visits within past 90 days and meeting all other requirements Today's Visits Date Type  Provider Dept  10/03/21 Office Visit Gillis Santa, MD [facility]  Showing today's visits and meeting all other requirements Future Appointments No visits were found meeting these conditions. Showing future appointments within next 90 days and meeting all other requirements  I discussed the assessment and treatment plan with the patient. The patient was provided an opportunity to ask questions and all were answered. The patient agreed with the plan and demonstrated an understanding of the instructions.  Patient advised to call back or seek an in-person evaluation if the symptoms or condition worsens.  Duration of encounter: 49mnutes.  Note by: BGillis Santa MD Date: 10/03/2021; Time: 2:41 PM

## 2021-10-08 ENCOUNTER — Telehealth: Payer: Self-pay | Admitting: Internal Medicine

## 2021-10-08 NOTE — Telephone Encounter (Signed)
Lft pt vm to call ofc regarding referral to [facility]. thanks

## 2021-10-10 ENCOUNTER — Telehealth: Payer: Self-pay | Admitting: Internal Medicine

## 2021-10-10 NOTE — Telephone Encounter (Signed)
Sent pt Mychart to call ofc. Thanks

## 2021-10-16 ENCOUNTER — Ambulatory Visit (INDEPENDENT_AMBULATORY_CARE_PROVIDER_SITE_OTHER): Payer: Medicare Other | Admitting: Licensed Clinical Social Worker

## 2021-10-16 ENCOUNTER — Other Ambulatory Visit: Payer: Self-pay

## 2021-10-16 DIAGNOSIS — F4312 Post-traumatic stress disorder, chronic: Secondary | ICD-10-CM | POA: Diagnosis not present

## 2021-10-16 NOTE — Plan of Care (Signed)
°  Problem: PTSD-Trauma Disorder CCP Problem  1 Reduce the negative impact trauma related symptoms have on social, occupational, and family functioning per pt self report 3 out of 5 sessions documented. . Goal: STG:  Reduction in intrusive event recollections, avoidance of event reminders, intense arousal, or disinterest in activities or relationships 3 out of 5 sessions documented Outcome: Progressing Goal: LTG: Reduce frequency, intensity, and duration of depression symptoms as evidenced by pt self report 3 out of 5 sessions documented Outcome: Progressing Goal: LTG-Patient's behavior demonstrates decreased anxiety Outcome: Progressing Intervention: Assist patient to identify own strengths and abilities Intervention: Discuss self-management skills Intervention: Encourage patient to identify triggers Intervention: Discuss identification of alternative positive behavior

## 2021-10-16 NOTE — Progress Notes (Signed)
°  THERAPIST PROGRESS NOTE  Session Time: 1-2p  Participation Level: Active  Behavioral Response: Neat and Well GroomedAlertAnxious and Depressed  Type of Therapy: Individual Therapy  Treatment Goals addressed:  Problem: PTSD-Trauma Disorder CCP Problem  1 Reduce the negative impact trauma related symptoms have on social, occupational, and family functioning per pt self report 3 out of 5 sessions documented. . Goal: STG:  Reduction in intrusive event recollections, avoidance of event reminders, intense arousal, or disinterest in activities or relationships 3 out of 5 sessions documented Outcome: Progressing Goal: LTG: Reduce frequency, intensity, and duration of depression symptoms as evidenced by pt self report 3 out of 5 sessions documented Outcome: Progressing Goal: LTG-Patient's behavior demonstrates decreased anxiety Outcome: Progressing Interventions:  Intervention: Assist patient to identify own strengths and abilities Intervention: Discuss self-management skills Intervention: Encourage patient to identify triggers Intervention: Discuss identification of alternative positive behavior  Summary: [name] is a 66 y.o. female who presents with symptoms consistent with PTSD.   Allowed pt to explore and express thoughts and feelings associated with recent life situations and external stressors. Processed through several lifetime traumatic incidents and overall psychological impact. Explored pts relationship with daughter and pts perspective of pros/cons of choices that daughter has made about her life Discussed overall health--pt had flu a few weeks ago and that was a difficult experience. Discussed relationships from past and present. Pt reports that she feels she takes things personally sometimes when people say or don't say or do things that pt feels they should. Explored this for a while and referred pt to read "The Four Agreements" by Monna Fam.  Discussed grief/loss  management.   Continued recommendations are as follows: self care behaviors, positive social engagements, focusing on overall work/home/life balance, and focusing on positive physical and emotional wellness.   Suicidal/Homicidal: No  Therapist Response: Pt is continuing to apply interventions learned in session into daily life situations. Pt is currently on track to meet goals utilizing interventions mentioned above. Personal growth and progress noted. Treatment to continue as indicated.   Plan: Return again in 4 weeks.  Diagnosis: Axis I: PTSD    Axis II: No diagnosis    Ernest Haber Sampson Self, LCSW 10/16/2021

## 2021-10-18 NOTE — Telephone Encounter (Signed)
Pt called in wanting to get clarification on a name that she was given for her referral to the GI doctor

## 2021-10-28 ENCOUNTER — Encounter: Payer: Self-pay | Admitting: Family

## 2021-10-28 ENCOUNTER — Other Ambulatory Visit: Payer: Self-pay | Admitting: Family

## 2021-10-28 ENCOUNTER — Telehealth (INDEPENDENT_AMBULATORY_CARE_PROVIDER_SITE_OTHER): Payer: Medicare Other | Admitting: Family

## 2021-10-28 ENCOUNTER — Other Ambulatory Visit: Payer: Self-pay | Admitting: Internal Medicine

## 2021-10-28 ENCOUNTER — Other Ambulatory Visit: Payer: Self-pay

## 2021-10-28 VITALS — Wt 165.0 lb

## 2021-10-28 DIAGNOSIS — J029 Acute pharyngitis, unspecified: Secondary | ICD-10-CM | POA: Insufficient documentation

## 2021-10-28 DIAGNOSIS — Z20822 Contact with and (suspected) exposure to covid-19: Secondary | ICD-10-CM | POA: Diagnosis not present

## 2021-10-28 DIAGNOSIS — J069 Acute upper respiratory infection, unspecified: Secondary | ICD-10-CM | POA: Diagnosis not present

## 2021-10-28 LAB — POC COVID19 BINAXNOW: SARS Coronavirus 2 Ag: NEGATIVE

## 2021-10-28 LAB — POCT RAPID STREP A (OFFICE): Rapid Strep A Screen: NEGATIVE

## 2021-10-28 MED ORDER — AMOXICILLIN 500 MG PO CAPS: 500.0000 mg | ORAL_CAPSULE | Freq: Two times a day (BID) | ORAL | 0 refills | Status: AC

## 2021-10-28 NOTE — Assessment & Plan Note (Signed)
Strep test, pending results. Suspected strep.  Warm gargle salt water, tylenol prn.

## 2021-10-28 NOTE — Assessment & Plan Note (Signed)
suspected strep, pending results of strep test, pt driving here to obtain.  Will treat with amoxicillin if positive.  Otherwise will treat as viral for now,  Advised patient on supportive measures:  Be sure to rest, drink plenty of fluids, and use tylenol or ibuprofen as needed for pain. Follow up if fever >101, if symptoms worsen or if symptoms are not improved in 3 days. Patient verbalizes understanding.
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
## 2021-10-28 NOTE — Assessment & Plan Note (Signed)
covid test ordered pending results ?

## 2021-10-28 NOTE — Patient Instructions (Signed)
Be sure to rest, drink plenty of fluids, and use tylenol or ibuprofen as needed for pain. Follow up if fever >101, if symptoms worsen or if symptoms are not improved in 3 days.   You can try a few things over the counter to help with your symptoms including:  Cough: Delsym or Robitussin (get the off brand, works just as well) Chest Congestion: Mucinex (plain, not DM) Nasal Congestion/Ear Pressure/Sinus Pressure: Try using Flonase (fluticasone) nasal spray. This can be purchased over the counter. Body aches, fevers, headache: Ibuprofen (not to exceed 2400 mg in 24 hours) or Acetaminophen-Tylenol (not to exceed 3000 mg in 24 hours) Runny Nose/Throat Drainage/Sneezing/Itchy or Watery Eyes: An antihistamine such as Zyrtec, Claritin, Xyzal, Allegra  You should be feeling better by day seven of symptoms, but please do contact me if this is not the case.  

## 2021-10-28 NOTE — Progress Notes (Signed)
X °

## 2021-11-01 ENCOUNTER — Telehealth: Payer: Self-pay | Admitting: Gastroenterology

## 2021-11-01 NOTE — Telephone Encounter (Signed)
Type Date User Summary Attachment  General 10/18/2021 10:18 AM [name] Patinet called has seen diferent GI providers in the past. [facility], [facility] and [facility] patient states she is requesting another transfer of care back to [facility] for a second opinion. Stated Dr. Tobi Bastos does not know what else to do for her. Request is pending -  Note   P[place]et called has seen diferent GI providers in the past. [facility], [facility] and [facility] patient states she is requesting another transfer of care back to [facility] for a second opinion. Stated Dr. Tobi Bastos does not know what else to do for her. Request is pending.          Please advise note copied from Patient referral. Is there anything we need to do?

## 2021-11-01 NOTE — Telephone Encounter (Signed)
Pt called in stating that she was referred to a GI doctor. Pt stated that she was given a number to call and when she called that number she was advise that the office will call her back because she had already seen two doctors about the situation before. Pt stated that no one had called her back yet. Pt requesting callback

## 2021-11-01 NOTE — Telephone Encounter (Signed)
Patient calling in regards to previous messages. Patient states she is unable to be seen because she's been seen previously for this same issue by two other GI Doctors.

## 2021-11-01 NOTE — Telephone Encounter (Signed)
She has had an extensive evaluation at a few other clinics which I reviewed. Not sure I have much to add at this point, you may want to ask Dr. Silverio Decamp if she would be interested in seeing this patient with persistent dysphagia.

## 2021-11-01 NOTE — Telephone Encounter (Signed)
Hi Dr. Adela Lank,   We received a referral from PCP for patient to be seen for a second opinion Dig: Dysphagia. Patient has seen a few GI providers in the past. She is seeking to transfer her care back to [facility]. Records available via Epic for review and advise.   Thank you

## 2021-11-04 NOTE — Telephone Encounter (Signed)
Unfortunately she has had extensive GI work-up for dysphagia that was unrevealing for any significant pathology, do not recommend switching providers.  Discussed with Dr. Adela Lank, he is willing to provide ongoing GI care for other issues if needed.  Thank you

## 2021-11-04 NOTE — Telephone Encounter (Signed)
Patient calling back in and informed of the below message by the front desk.

## 2021-11-06 ENCOUNTER — Telehealth: Payer: Self-pay | Admitting: Internal Medicine

## 2021-11-06 NOTE — Telephone Encounter (Signed)
Inform pt per [facility] review of request for 3rd GI opinion   Does she still want to be seen there in [place]?   Thanks Miguel Dibble up to the patient, please let her know we reviewed her file,. I do not think we are going to add much for her work-up for dysphagia, but up to her if she wishes to proceed.  Napoleon Form, MD to Loistine Chance, MD      4:44 PM Note Unfortunately she has had extensive GI work-up for dysphagia that was unrevealing for any significant pathology, do not recommend switching providers.  Discussed with Dr. Adela Lank, he is willing to provide ongoing GI care for other issues if needed.  Thank you

## 2021-11-07 ENCOUNTER — Encounter: Payer: Self-pay | Admitting: Internal Medicine

## 2021-11-07 NOTE — Telephone Encounter (Signed)
Patient called about her 3rd referral, she has not heard anything. She would like a call back from office.

## 2021-11-11 NOTE — Telephone Encounter (Signed)
Patient calling back in and informed of the below. Patient states that she is upset and may need to change all her Doctors. States something is wrong and this is not normal for her.   States that if the doctors do not know what else to do for her they should just say that. States she will not be seeing GI.   For your information

## 2021-11-11 NOTE — Telephone Encounter (Signed)
See 11/06/21 Patient message encounter

## 2021-11-12 ENCOUNTER — Encounter: Payer: Self-pay | Admitting: Gastroenterology

## 2021-11-12 NOTE — Telephone Encounter (Signed)
[name] can you schedule an appt for this pt with Dr. Caryl Comes or [place] see prior messages

## 2021-11-12 NOTE — Telephone Encounter (Signed)
Please call pt and inform  Thank you

## 2021-11-12 NOTE — Telephone Encounter (Signed)
Pt is upset  Would you all be agreeable to see her for a least consult on the chronic issues plus any new issues that arise?

## 2021-11-12 NOTE — Telephone Encounter (Signed)
Patient has been scheduled for 12/05/21.

## 2021-11-13 NOTE — Telephone Encounter (Signed)
Patient was informed.

## 2021-11-26 ENCOUNTER — Ambulatory Visit (INDEPENDENT_AMBULATORY_CARE_PROVIDER_SITE_OTHER): Payer: Medicare Other | Admitting: Internal Medicine

## 2021-11-26 ENCOUNTER — Encounter: Payer: Self-pay | Admitting: Internal Medicine

## 2021-11-26 ENCOUNTER — Ambulatory Visit: Payer: Self-pay | Admitting: Pharmacist

## 2021-11-26 DIAGNOSIS — R1310 Dysphagia, unspecified: Secondary | ICD-10-CM

## 2021-11-26 DIAGNOSIS — N393 Stress incontinence (female) (male): Secondary | ICD-10-CM | POA: Diagnosis not present

## 2021-11-26 DIAGNOSIS — K5909 Other constipation: Secondary | ICD-10-CM | POA: Diagnosis not present

## 2021-11-26 NOTE — Chronic Care Management (AMB) (Signed)
?  Chronic Care Management  ? ?Note ? ?11/26/2021 ?Name: IYONA PEHRSON MRN: [id number] DOB: 1956/08/21 ? ? ? ?Closing pharmacy CCM case at this time. Will collaborate with Care Guide to outreach to schedule follow up with RN CM. Patient has clinic contact information for future questions or concerns.  ? ?[name], PharmD, [place], CPP ?Clinical Pharmacist ?Nature conservation officer at [employer] ?[phone] ? ?

## 2021-11-27 ENCOUNTER — Ambulatory Visit (INDEPENDENT_AMBULATORY_CARE_PROVIDER_SITE_OTHER): Payer: Medicare Other | Admitting: Licensed Clinical Social Worker

## 2021-11-27 ENCOUNTER — Other Ambulatory Visit: Payer: Self-pay

## 2021-11-27 ENCOUNTER — Ambulatory Visit (INDEPENDENT_AMBULATORY_CARE_PROVIDER_SITE_OTHER): Payer: Medicare Other | Admitting: Internal Medicine

## 2021-11-27 ENCOUNTER — Encounter: Payer: Self-pay | Admitting: Internal Medicine

## 2021-11-27 VITALS — BP 110/68 | HR 73 | Temp 97.9°F | Ht 65.0 in | Wt 168.2 lb

## 2021-11-27 DIAGNOSIS — I10 Essential (primary) hypertension: Secondary | ICD-10-CM

## 2021-11-27 DIAGNOSIS — M25551 Pain in right hip: Secondary | ICD-10-CM

## 2021-11-27 DIAGNOSIS — Z1231 Encounter for screening mammogram for malignant neoplasm of breast: Secondary | ICD-10-CM

## 2021-11-27 DIAGNOSIS — F4312 Post-traumatic stress disorder, chronic: Secondary | ICD-10-CM

## 2021-11-27 DIAGNOSIS — M542 Cervicalgia: Secondary | ICD-10-CM

## 2021-11-27 DIAGNOSIS — K5909 Other constipation: Secondary | ICD-10-CM | POA: Diagnosis not present

## 2021-11-27 DIAGNOSIS — Z981 Arthrodesis status: Secondary | ICD-10-CM

## 2021-11-27 DIAGNOSIS — G8929 Other chronic pain: Secondary | ICD-10-CM

## 2021-11-27 DIAGNOSIS — M62838 Other muscle spasm: Secondary | ICD-10-CM

## 2021-11-27 DIAGNOSIS — M5441 Lumbago with sciatica, right side: Secondary | ICD-10-CM

## 2021-11-27 DIAGNOSIS — K581 Irritable bowel syndrome with constipation: Secondary | ICD-10-CM

## 2021-11-27 DIAGNOSIS — M4802 Spinal stenosis, cervical region: Secondary | ICD-10-CM

## 2021-11-27 DIAGNOSIS — E039 Hypothyroidism, unspecified: Secondary | ICD-10-CM | POA: Diagnosis not present

## 2021-11-27 DIAGNOSIS — Z23 Encounter for immunization: Secondary | ICD-10-CM

## 2021-11-27 DIAGNOSIS — M47812 Spondylosis without myelopathy or radiculopathy, cervical region: Secondary | ICD-10-CM

## 2021-11-27 MED ORDER — CYCLOBENZAPRINE HCL 5 MG PO TABS: 5.0000 mg | ORAL_TABLET | Freq: Every evening | ORAL | 5 refills | Status: DC | PRN

## 2021-11-27 MED ORDER — LINACLOTIDE 145 MCG PO CAPS: 145.0000 ug | ORAL_CAPSULE | Freq: Every day | ORAL | 3 refills | Status: DC

## 2021-11-27 MED ORDER — SHINGRIX 50 MCG/0.5ML IM SUSR: 0.5000 mL | Freq: Once | INTRAMUSCULAR | 1 refills | Status: AC

## 2021-11-27 MED ORDER — AMLODIPINE BESYLATE 2.5 MG PO TABS: 2.5000 mg | ORAL_TABLET | Freq: Every day | ORAL | 3 refills | Status: DC

## 2021-11-27 MED ORDER — TETANUS-DIPHTH-ACELL PERTUSSIS 5-2.5-18.5 LF-MCG/0.5 IM SUSP: 0.5000 mL | Freq: Once | INTRAMUSCULAR | 0 refills | Status: AC

## 2021-11-27 MED ORDER — LINACLOTIDE 145 MCG PO CAPS: 145.0000 ug | ORAL_CAPSULE | Freq: Every day | ORAL | 0 refills | Status: DC
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
## 2021-11-27 NOTE — Patient Instructions (Addendum)
Warm prune juice  [facility]   Dr. Adela Lank  Phone Fax E-mail Address  [phone] Not available [address]   [address]   [place] [address]     Specialties     Gastroenterology            Constipation, Adult Constipation is when a person has fewer than three bowel movements in a week, has difficulty having a bowel movement, or has stools (feces) that are dry, hard, or larger than normal. Constipation may be caused by an underlying condition. It may become worse with age if a person takes certain medicines and does not take in enough fluids. Follow these instructions at home: Eating and drinking  Eat foods that have a lot of fiber, such as beans, whole grains, and fresh fruits and vegetables. Limit foods that are low in fiber and high in fat and processed sugars, such as fried or sweet foods. These include french fries, hamburgers, cookies, candies, and soda. Drink enough fluid to keep your urine pale yellow. General instructions Exercise regularly or as told by your health care provider. Try to do 150 minutes of moderate exercise each week. Use the bathroom when you have the urge to go. Do not hold it in. Take over-the-counter and prescription medicines only as told by your health care provider. This includes any fiber supplements. During bowel movements: Practice deep breathing while relaxing the lower abdomen. Practice pelvic floor relaxation. Watch your condition for any changes. Let your health care provider know about them. Keep all follow-up visits as told by your health care provider. This is important. Contact a health care provider if: You have pain that gets worse. You have a fever. You do not have a bowel movement after 4 days. You vomit. You are not hungry or you lose weight. You are bleeding from the opening between the buttocks (anus). You have thin, pencil-like stools. Get help right away if: You have a fever and your symptoms suddenly  get worse. You leak stool or have blood in your stool. Your abdomen is bloated. You have severe pain in your abdomen. You feel dizzy or you faint. Summary Constipation is when a person has fewer than three bowel movements in a week, has difficulty having a bowel movement, or has stools (feces) that are dry, hard, or larger than normal. Eat foods that have a lot of fiber, such as beans, whole grains, and fresh fruits and vegetables. Drink enough fluid to keep your urine pale yellow. Take over-the-counter and prescription medicines only as told by your health care provider. This includes any fiber supplements. This information is not intended to replace advice given to you by your health care provider. Make sure you discuss any questions you have with your health care provider. Document Revised: 07/27/2019 Document Reviewed: 07/27/2019 Elsevier Patient Education  2022 Elsevier Inc.   Patient Name: [name], MRN: [id number], Accession: [id number], DOS: 11/18/2017 5:08 PM, DOF: 11/19/2017 10:04 AM   EXAMINATION: MRI cervical spine without contrast   IMPRESSION:  No acute compression fracture.   Status post anterior cervical discectomy and fusion and interbody fusion of the C4-C5 level.   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   Neural foraminal stenosis at C5-C6 and C6-C7 as described.   CLINICAL INDICATION: new and worsening right extremity pain s/p ACDF :: M96.1 Postlaminectomy syndrome, not  elsewhere classified   TECHNIQUE:  Multi-planar, multi-sequence MR of the cervical spine was performed.    Intravenous contrast: None   COMPARISON: Radiographs of the cervical spine dated Feb 03, 2017    Low-FODMAP Eating Plan FODMAP stands for fermentable oligosaccharides, disaccharides, monosaccharides, and polyols. These are sugars that are hard for some people  to digest. A low-FODMAP eating plan may help some people who have irritable bowel syndrome (IBS) and certain other bowel (intestinal) diseases to manage their symptoms. This meal plan can be complicated to follow. Work with a diet and nutrition specialist (dietitian) to make a low-FODMAP eating plan that is right for you. A dietitian can help make sure that you get enough nutrition from this diet. What are tips for following this plan? Reading food labels Check labels for hidden FODMAPs such as: High-fructose syrup. Honey. Agave. Natural fruit flavors. Onion or garlic powder. Choose low-FODMAP foods that contain 3-4 grams of fiber per serving. Check food labels for serving sizes. Eat only one serving at a time to make sure FODMAP levels stay low. Shopping Shop with a list of foods that are recommended on this diet and make a meal plan. Meal planning Follow a low-FODMAP eating plan for up to 6 weeks, or as told by your health care provider or dietitian. To follow the eating plan: Eliminate high-FODMAP foods from your diet completely. Choose only low-FODMAP foods to eat. You will do this for 2-6 weeks. Gradually reintroduce high-FODMAP foods into your diet one at a time. Most people should wait a few days before introducing the next new high-FODMAP food into their meal plan. Your dietitian can recommend how quickly you may reintroduce foods. Keep a daily record of what and how much you eat and drink. Make note of any symptoms that you have after eating. Review your daily record with a dietitian regularly to identify which foods you can eat and which foods you should avoid. General tips Drink enough fluid each day to keep your urine pale yellow. Avoid processed foods. These often have added sugar and may be high in FODMAPs. Avoid most dairy products, whole grains, and sweeteners. Work with a dietitian to make sure you get enough fiber in your diet. Avoid high FODMAP foods at meals to manage  symptoms. Recommended foods Fruits Bananas, oranges, tangerines, lemons, limes, blueberries, raspberries, strawberries, grapes, cantaloupe, honeydew melon, kiwi, papaya, passion fruit, and pineapple. Limited amounts of dried cranberries, banana chips, and shredded coconut. Vegetables Eggplant, zucchini, cucumber, peppers, green beans, bean sprouts, lettuce, arugula, kale, Swiss chard, spinach, collard greens, bok choy, summer squash, potato, and tomato. Limited amounts of corn, carrot, and sweet potato. Green parts of scallions. Grains Gluten-free grains, such as rice, oats, buckwheat, quinoa, corn, polenta, and millet. Gluten-free pasta, bread, or cereal. Rice noodles. Corn tortillas. Meats and other proteins Unseasoned beef, pork, poultry, or fish. Eggs. Tomasa Blase. Tofu (firm) and tempeh. Limited amounts of nuts and seeds, such as almonds, walnuts, [place] nuts, pecans, peanuts, nut butters, pumpkin seeds, chia seeds, and sunflower seeds. Dairy Lactose-free milk, yogurt, and kefir. Lactose-free cottage cheese and ice cream. Non-dairy milks, such as almond, coconut, hemp, and rice milk. Non-dairy yogurt. Limited amounts of goat cheese, brie, mozzarella, parmesan, swiss, and other hard cheeses. Fats and oils Butter-free spreads. Vegetable oils, such as olive, canola, and sunflower oil. Seasoning and other foods Artificial sweeteners with names that do not end in "ol," such as aspartame, saccharine, and stevia. Maple syrup, white table sugar, raw sugar, brown sugar, and  molasses. Mayonnaise, soy sauce, and tamari. Fresh basil, coriander, parsley, rosemary, and thyme. Beverages Water and mineral water. Sugar-sweetened soft drinks. Small amounts of orange juice or cranberry juice. Black and green tea. Most dry wines. Coffee. The items listed above may not be a complete list of foods and beverages you can eat. Contact a dietitian for more information. Foods to avoid Fruits Fresh, dried, and juiced forms  of apple, pear, watermelon, peach, plum, cherries, apricots, blackberries, boysenberries, figs, nectarines, and mango. Avocado. Vegetables Chicory root, artichoke, asparagus, cabbage, snow peas, Brussels sprouts, broccoli, sugar snap peas, mushrooms, celery, and cauliflower. Onions, garlic, leeks, and the white part of scallions. Grains Wheat, including kamut, durum, and semolina. Barley and bulgur. Couscous. Wheat-based cereals. Wheat noodles, bread, crackers, and pastries. Meats and other proteins Fried or fatty meat. Sausage. Cashews and pistachios. Soybeans, baked beans, black beans, chickpeas, kidney beans, fava beans, navy beans, lentils, black-eyed peas, and split peas. Dairy Milk, yogurt, ice cream, and soft cheese. Cream and sour cream. Milk-based sauces. Custard. Buttermilk. Soy milk. Seasoning and other foods Any sugar-free gum or candy. Foods that contain artificial sweeteners such as sorbitol, mannitol, isomalt, or xylitol. Foods that contain honey, high-fructose corn syrup, or agave. Bouillon, vegetable stock, beef stock, and chicken stock. Garlic and onion powder. Condiments made with onion, such as hummus, chutney, pickles, relish, salad dressing, and salsa. Tomato paste. Beverages Chicory-based drinks. Coffee substitutes. Chamomile tea. Fennel tea. Sweet or fortified wines such as port or sherry. Diet soft drinks made with isomalt, mannitol, maltitol, sorbitol, or xylitol. Apple, pear, and mango juice. Juices with high-fructose corn syrup. The items listed above may not be a complete list of foods and beverages you should avoid. Contact a dietitian for more information. Summary FODMAP stands for fermentable oligosaccharides, disaccharides, monosaccharides, and polyols. These are sugars that are hard for some people to digest. A low-FODMAP eating plan is a short-term diet that helps to ease symptoms of certain bowel diseases. The eating plan usually lasts up to 6 weeks. After that,  high-FODMAP foods are reintroduced gradually and one at a time. This can help you find out which foods may be causing symptoms. A low-FODMAP eating plan can be complicated. It is best to work with a dietitian who has experience with this type of plan. This information is not intended to replace advice given to you by your health care provider. Make sure you discuss any questions you have with your health care provider. Document Revised: 01/26/2020 Document Reviewed: 01/26/2020 Elsevier Patient Education  2022 Elsevier Inc.  Linaclotide Oral Capsules What is this medication? LINACLOTIDE (lin a KLOE tide) treats irritable bowel syndrome (IBS) with constipation as the main problem. It is also used for relief of chronic constipation. This medicine may be used for other purposes; ask your health care provider or pharmacist if you have questions. COMMON BRAND NAME(S): Linzess What should I tell my care team before I take this medication? They need to know if you have any of these conditions: diarrhea history of blockage in your bowels history of stool (fecal) impaction an unusual or allergic reaction to linaclotide, other medicines, foods, dyes, or preservatives pregnant or trying to get pregnant breast-feeding How should I use this medication? Take this medicine by mouth with water. Take it as directed on the prescription label at the same time every day. Do not cut, crush or chew this medicine. Swallow the capsules whole. Take it on an empty stomach, at least 30 minutes before your first meal  of the day. Keep taking it unless your health care provider tells you to stop. A special MedGuide will be given to you by the pharmacist with each prescription and refill. Be sure to read this information carefully each time. Talk to your health care provider about the use of this medicine in children. It is not approved for use in children. Overdosage: If you think you have taken too much of this medicine  contact a poison control center or emergency room at once. NOTE: This medicine is only for you. Do not share this medicine with others. What if I miss a dose? If you miss a dose, skip it. Take your next dose at the normal time. Do not take extra or 2 doses at the same time to make up for the missed dose. What may interact with this medication? Interactions have not been studied. This list may not describe all possible interactions. Give your health care provider a list of all the medicines, herbs, non-prescription drugs, or dietary supplements you use. Also tell them if you smoke, drink alcohol, or use illegal drugs. Some items may interact with your medicine. What should I watch for while using this medication? Visit your health care provider for regular checks on your progress. Tell your health care provider if your symptoms do not start to get better or if they get worse. Diarrhea is a common side effect of this medicine. It often begins within 2 weeks of starting this medicine. Stop taking this medicine and call your doctor if you get severe diarrhea. Stop taking this medicine and call your doctor or go to the nearest hospital emergency room right away if you develop unusual or severe stomach pain, especially if you also have bright red, bloody stools or black stools that look like tar. What side effects may I notice from receiving this medication? Side effects that you should report to your doctor or health care professional as soon as possible: allergic reactions (skin rash, itching or hives; swelling of the face, lips, or tongue) black, tarry stools bloody or watery diarrhea new or worsening stomach pain severe or prolonged diarrhea Side effects that usually do not require medical attention (report to your doctor or health care professional if they continue or are bothersome): bloating loose stools passing gas This list may not describe all possible side effects. Call your doctor for  medical advice about side effects. You may report side effects to FDA at 1-800-FDA-1088. Where should I keep my medication? Keep out of the reach of children and pets. Store at room temperature at 25 degrees C (77 degrees F). Keep this medicine in the original container. Protect from moisture. Keep the container tightly closed. Do not throw out the packet in the container. It keeps the medicine dry. Get rid of any unused medicine after the expiration date. To get rid of medicines that are no longer needed or have expired: Take the medicine to a medicine take-back program. Check with your pharmacy or law enforcement to find a location. If you cannot return the medicine, check the label or package insert to see if the medicine should be thrown out in the garbage or flushed down the toilet. If you are not sure, ask your health care provider. If it is safe to put it in the trash, take the medicine out of the container. Mix the medicine with cat litter, dirt, coffee grounds, or other unwanted substance. Seal the mixture in a bag or container. Put it in the trash.  NOTE: This sheet is a summary. It may not cover all possible information. If you have questions about this medicine, talk to your doctor, pharmacist, or health care provider.  2022 Elsevier/Gold Standard (2020-10-05 00:00:00)

## 2021-11-27 NOTE — Progress Notes (Addendum)
? ?  THERAPIST PROGRESS NOTE ? ?Session Time: 1-2p ? ?Participation Level: Active ? ?Behavioral Response: Neat and Well GroomedAlertAnxious ? ?Type of Therapy: Individual Therapy ? ?Treatment Goals addressed: Problem: PTSD-Trauma Disorder CCP Problem  1 Reduce the negative impact trauma related symptoms have on social, occupational, and family functioning per pt self report 3 out of 5 sessions documented. Marland Kitchen ?Goal: STG:  Reduction in intrusive event recollections, avoidance of event reminders, intense arousal, or disinterest in activities or relationships 3 out of 5 sessions documented ?Outcome: Progressing ? ?ProgressTowards Goals: Progressing ? ?Interventions: CBT and Supportive ? ?Summary: [name] is a 66 y.o. female who presents with improving symptoms related to PTSD. Pt reports overall mood is stable and that she is managing stressors well. Pt reports poor quality and quantity of sleep currently.  ? ?Allowed pt to explore and express thoughts and feelings associated with recent life situations and external stressors. Pt recently visited son in [place] and visited [place] with his family. Discussed positive and negative events that happened during the two week stay with son. Discussed relationship with DIL.  Explored relationship with daughter in [place].  Explored pts continuing grief about losing her dog. Validated pts feelings and encouraged continuing self care. Praised pts progress.  ? ?Continued recommendations are as follows: self care behaviors, positive social engagements, focusing on overall work/home/life balance, and focusing on positive physical and emotional wellness.  ? ?Suicidal/Homicidal: No ? ?Therapist Response: Pt is continuing to apply interventions learned in session into daily life situations. Pt is currently on track to meet goals utilizing interventions mentioned above. Personal growth and progress noted. Treatment to continue as indicated.  ? ? ?Plan: Return again in 4 weeks. ? ?Diagnosis:  Chronic post-traumatic stress disorder (PTSD) ? ?Collaboration of Care: Other pt encouraged to continue follow up appts with psychiatrist of record, Dr. Elna Breslow ? ?Patient/Guardian was advised Release of Information must be obtained prior to any record release in order to collaborate their care with an outside provider. Patient/Guardian was advised if they have not already done so to contact the registration department to sign all necessary forms in order for [place] to release information regarding their care.  ? ?Consent: Patient/Guardian gives verbal consent for treatment and assignment of benefits for services provided during this visit. Patient/Guardian expressed understanding and agreed to proceed.  ? ?Kristyanna Barcelo R Makinsey Pepitone, LCSW ?11/29/2021 ? ?

## 2021-11-28 ENCOUNTER — Emergency Department
Admission: EM | Admit: 2021-11-28 | Discharge: 2021-11-28 | Disposition: A | Discharge status: Home / Self Care | Payer: Medicare Other | Attending: Emergency Medicine | Admitting: Emergency Medicine

## 2021-11-28 ENCOUNTER — Other Ambulatory Visit: Payer: Self-pay | Admitting: Internal Medicine

## 2021-11-28 ENCOUNTER — Emergency Department: Payer: Medicare Other

## 2021-11-28 ENCOUNTER — Other Ambulatory Visit: Payer: Self-pay

## 2021-11-28 ENCOUNTER — Encounter: Payer: Self-pay | Admitting: Emergency Medicine

## 2021-11-28 DIAGNOSIS — K5900 Constipation, unspecified: Secondary | ICD-10-CM | POA: Insufficient documentation

## 2021-11-28 DIAGNOSIS — E039 Hypothyroidism, unspecified: Secondary | ICD-10-CM

## 2021-11-28 LAB — CBC WITH DIFFERENTIAL/PLATELET
Basophils Absolute: 0 10*3/uL (ref 0.0–0.1)
Basophils Relative: 0.5 % (ref 0.0–3.0)
Eosinophils Absolute: 0.2 10*3/uL (ref 0.0–0.7)
Eosinophils Relative: 3.4 % (ref 0.0–5.0)
HCT: 37.2 % (ref 36.0–46.0)
Hemoglobin: 12.6 g/dL (ref 12.0–15.0)
Lymphocytes Relative: 30.1 % (ref 12.0–46.0)
Lymphs Abs: 1.4 10*3/uL (ref 0.7–4.0)
MCHC: 33.8 g/dL (ref 30.0–36.0)
MCV: 86 fl (ref 78.0–100.0)
Monocytes Absolute: 0.5 10*3/uL (ref 0.1–1.0)
Monocytes Relative: 11 % (ref 3.0–12.0)
Neutro Abs: 2.5 10*3/uL (ref 1.4–7.7)
Neutrophils Relative %: 55 % (ref 43.0–77.0)
Platelets: 217 10*3/uL (ref 150.0–400.0)
RBC: 4.32 Mil/uL (ref 3.87–5.11)
RDW: 13.7 % (ref 11.5–15.5)
WBC: 4.5 10*3/uL (ref 4.0–10.5)

## 2021-11-28 LAB — COMPREHENSIVE METABOLIC PANEL
ALT: 28 U/L (ref 0–35)
AST: 30 U/L (ref 0–37)
Albumin: 4.3 g/dL (ref 3.5–5.2)
Alkaline Phosphatase: 55 U/L (ref 39–117)
BUN: 16 mg/dL (ref 6–23)
CO2: 31 mEq/L (ref 19–32)
Calcium: 9.6 mg/dL (ref 8.4–10.5)
Chloride: 104 mEq/L (ref 96–112)
Creatinine, Ser: 0.79 mg/dL (ref 0.40–1.20)
GFR: 79.41 mL/min (ref >60.00)
Glucose, Bld: 105 mg/dL — ABNORMAL HIGH (ref 70–99)
Potassium: 4 mEq/L (ref 3.5–5.1)
Sodium: 139 mEq/L (ref 135–145)
Total Bilirubin: 0.4 mg/dL (ref 0.2–1.2)
Total Protein: 6.8 g/dL (ref 6.0–8.3)

## 2021-11-28 LAB — TSH: TSH: 1.69 u[IU]/mL (ref 0.35–5.50)

## 2021-11-28 MED ORDER — LACTULOSE 10 GM/15ML PO SOLN
20.0000 g | Freq: Once | ORAL | Status: AC
  Administered 2021-11-28: 18:00:00 20 g via ORAL
  Filled 2021-11-28: qty 30

## 2021-11-28 MED ORDER — LEVOTHYROXINE SODIUM 175 MCG PO TABS: 175.0000 ug | ORAL_TABLET | Freq: Every day | ORAL | 3 refills | Status: DC

## 2021-11-28 MED ORDER — MINERAL OIL RE ENEM
1.0000 | ENEMA | Freq: Once | RECTAL | Status: AC
  Administered 2021-11-28: 17:00:00 1 via RECTAL

## 2021-11-28 NOTE — ED Triage Notes (Signed)
Pt via POV from home. Pt c/o constipation. Last BM was Sunday. Pt has been trying OTC laxative with no relief. Pt is A&OX4 and NAD.  ?

## 2021-11-28 NOTE — Discharge Instructions (Signed)
Use the lactulose.  If you do not have a bowel movement with this medication start taking MiraLAX 1 capful every 2 hours until you have a bowel movement.  When you travel you may want to take MiraLAX with you to take 1 capful per day to keep your stools soft so that you will not get constipated and have discomfort. ?

## 2021-11-28 NOTE — ED Notes (Signed)
Pt verbalized understanding of discharge instructions and follow-up care instructions. Pt advised if symptoms worsen to return to ED. E-signature not available due to e-signature pad not working.  

## 2021-11-28 NOTE — ED Notes (Signed)
See triage note  presents with abd discomfort and constipation  last bm was about 3-4 days ago  has tried OTC med w/o relief ?

## 2021-11-28 NOTE — ED Notes (Signed)
Pt states she was only to move a little bit of stool after receiving enema.  ?

## 2021-11-28 NOTE — ED Provider Notes (Signed)
? ?[facility] ?Provider Note ? ? ? Event Date/Time  ? First MD Initiated Contact with Patient 11/28/21 1536   ?  (approximate) ? ? ?History  ? ?Constipation ? ? ?HPI ? ?[name] is a 66 y.o. female presents emergency department complaining of constipation.  Patient states that she did travel to [place] to visit her son and her bowel habits were is normal.  States she is taking several laxatives and MiraLAX without any relief.  States last bowel movement was Tuesday but was not normal.  States she is feels like she is full of stool.  She denies fever or chills.  No vomiting ? ?  ? ? ?Physical Exam  ? ?Triage Vital Signs: ?ED Triage Vitals  ?Enc Vitals Group  ?   BP 11/28/21 1446 132/90  ?   Pulse Rate 11/28/21 1446 88  ?   Resp 11/28/21 1446 18  ?   Temp 11/28/21 1446 97.6 ?F (36.4 ?C)  ?   Temp Source 11/28/21 1446 Oral  ?   SpO2 11/28/21 1446 97 %  ?   Weight 11/28/21 1444 165 lb (74.8 kg)  ?   Height 11/28/21 1444 5\' 5"  (1.651 m)  ?   Head Circumference --   ?   Peak Flow --   ?   Pain Score 11/28/21 1444 7  ?   Pain Loc --   ?   Pain Edu? --   ?   Excl. in GC? --   ? ? ?Most recent vital signs: ?Vitals:  ? 11/28/21 1446  ?BP: 132/90  ?Pulse: 88  ?Resp: 18  ?Temp: 97.6 ?F (36.4 ?C)  ?SpO2: 97%  ? ? ? ?General: Awake, no distress.   ?CV:  Good peripheral perfusion. regular rate and  rhythm ?Resp:  Normal effort. Lungs CTA ?Abd:  No distention.  Nontender, bowel sounds all 4 quads are normal ?Other:    ? ? ?ED Results / Procedures / Treatments  ? ?Labs ?(all labs ordered are listed, but only abnormal results are displayed) ?Labs Reviewed - No data to display ? ? ?EKG ? ? ? ? ?RADIOLOGY ?X-ray of the abdomen 1 view ? ? ? ?PROCEDURES: ? ? ?Procedures ? ? ?MEDICATIONS ORDERED IN ED: ?Medications  ?lactulose (CHRONULAC) 10 GM/15ML solution 20 g (has no administration in time range)  ?mineral oil enema 1 enema (1 enema Rectal Given 11/28/21 1715)  ? ? ? ?IMPRESSION / MDM / ASSESSMENT AND PLAN /  ED COURSE  ?I reviewed the triage vital signs and the nursing notes. ?             ?               ? ?Differential diagnosis includes, but is not limited to, bowel obstruction, constipation, diverticulitis ? ?X-ray of the abdomen was independently reviewed by me, I do not see any concerns for ileus or bowel obstruction on the plain x-ray.  Read as moderate stool burden and no bowel obstruction by radiologist. ? ?I did discuss the findings with the patient.  We will try a mineral oil enema to see if she has success. ? ?Patient was able to has a small amount of stool with the enema.  We will give her lactulose to take at home.  Patient states she is very uncomfortable using our bathrooms as she is afraid people can hear her pass gas.  Told her I agree with her taking medication at home.  She is  to follow-up with her regular doctor if not improving in 3 days.  Return emergency department for worsening.  If the lactulose does not work she can use MiraLAX 1 capful every 2 hours until she has a bowel movement.  Patient was discharged stable condition. ? ? ?  ? ? ?FINAL CLINICAL IMPRESSION(S) / ED DIAGNOSES  ? ?Final diagnoses:  ?Constipation, unspecified constipation type  ? ? ? ?Rx / DC Orders  ? ?ED Discharge Orders   ? ? None  ? ?  ? ? ? ?Note:  This document was prepared using Dragon voice recognition software and may include unintentional dictation errors. ? ?  ?Faythe Ghee, PA-C ?11/28/21 1741 ? ?  ?Merwyn Katos, MD ?11/28/21 2333 ? ?

## 2021-11-29 ENCOUNTER — Telehealth: Payer: Self-pay | Admitting: Family Medicine

## 2021-11-29 NOTE — Telephone Encounter (Signed)
Patient was referred for urinary leakage, I call but was unable to reach. I did leave a voicemail to call back and schedule. ?

## 2021-11-29 NOTE — Plan of Care (Signed)
?  Problem: PTSD-Trauma Disorder CCP Problem  1 Reduce the negative impact trauma related symptoms have on social, occupational, and family functioning per pt self report 3 out of 5 sessions documented. . Goal: STG:  Reduction in intrusive event recollections, avoidance of event reminders, intense arousal, or disinterest in activities or relationships 3 out of 5 sessions documented Outcome: Progressing Goal: LTG: Reduce frequency, intensity, and duration of depression symptoms as evidenced by pt self report 3 out of 5 sessions documented Outcome: Progressing Goal: LTG-Patient's behavior demonstrates decreased anxiety Outcome: Progressing   

## 2021-12-02 ENCOUNTER — Encounter: Payer: Self-pay | Admitting: Internal Medicine

## 2021-12-03 NOTE — Telephone Encounter (Signed)
Glucose was 105 in recent lab results. Please advise   ? ? Tilford Pillar, CMA  ?12/02/2021  3:45 PM EDT Back to Top  ?  ?Patient has seen results on mychart. Mychart message sent to respond with any further questions.  ? Bevelyn Buckles, MD  ?11/28/2021 12:33 PM EST   ?  ?Liver kidneys normal ?Thyroid lab normal ?Blood cts normal   ? ?

## 2021-12-03 NOTE — Telephone Encounter (Signed)
For your information  

## 2021-12-05 ENCOUNTER — Encounter: Payer: Self-pay | Admitting: Gastroenterology

## 2021-12-05 ENCOUNTER — Ambulatory Visit: Payer: Medicare Other | Admitting: Gastroenterology

## 2021-12-05 VITALS — BP 98/74 | HR 72 | Ht 65.0 in | Wt 166.4 lb

## 2021-12-05 DIAGNOSIS — K219 Gastro-esophageal reflux disease without esophagitis: Secondary | ICD-10-CM | POA: Diagnosis not present

## 2021-12-05 DIAGNOSIS — K5900 Constipation, unspecified: Secondary | ICD-10-CM

## 2021-12-05 DIAGNOSIS — R1310 Dysphagia, unspecified: Secondary | ICD-10-CM

## 2021-12-05 MED ORDER — TRULANCE 3 MG PO TABS: 3.0000 mg | ORAL_TABLET | Freq: Every day | ORAL | 0 refills | Status: DC
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
## 2021-12-05 NOTE — Patient Instructions (Addendum)
If you are age 64 or older, your body mass index should be between 23-30. Your Body mass index is 27.69 kg/m?Marland Kitchen If this is out of the aforementioned range listed, please consider follow up with your Primary Care Provider. ? ?If you are age 84 or younger, your body mass index should be between 19-25. Your Body mass index is 27.69 kg/m?Marland Kitchen If this is out of the aformentioned range listed, please consider follow up with your Primary Care Provider.  ? ?________________________________________________________ ? ?The [facility] providers would like to encourage you to use [facility] to communicate with providers for non-urgent requests or questions.  Due to long hold times on the telephone, sending your provider a message by [facility] may be a faster and more efficient way to get a response.  Please allow 48 business hours for a response.  Please remember that this is for non-urgent requests.  ?_______________________________________________________ ? ? ?You have been scheduled for an endoscopy on 5-10.  We will add you to the wait list in case an earlier appointment opens up. Please follow written instructions given to you at your visit today. ?If you use inhalers (even only as needed), please bring them with you on the day of your procedure. ? ?Continue omeprazole and Pepcid. ?  ?Please combine a 238 g bottle of Miralax with 64 ounces of Gatorade and drink it all over the course of a couple of hours. ? ?Then, you can take a double dose of Miralax (2 capfuls) Twice a day  going forward. ? ?We have given you samples of the following medication to take: ?Trulance 3 mg: Take once daily IF the Miralax double dose twice a day is not effective. ? ?Thank you for entrusting me with your care and for choosing [facility], ?Dr. Ileene Patrick ? ? ? ? ? ? ? ? ? ?

## 2021-12-13 ENCOUNTER — Telehealth: Payer: Medicare Other

## 2021-12-17 DIAGNOSIS — G5631 Lesion of radial nerve, right upper limb: Secondary | ICD-10-CM | POA: Diagnosis not present

## 2021-12-18 ENCOUNTER — Ambulatory Visit (INDEPENDENT_AMBULATORY_CARE_PROVIDER_SITE_OTHER): Payer: Medicare Other | Admitting: Licensed Clinical Social Worker

## 2021-12-18 DIAGNOSIS — F4312 Post-traumatic stress disorder, chronic: Secondary | ICD-10-CM | POA: Diagnosis not present

## 2021-12-18 NOTE — Progress Notes (Signed)
Faxed via epic routing
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
## 2021-12-19 ENCOUNTER — Ambulatory Visit (INDEPENDENT_AMBULATORY_CARE_PROVIDER_SITE_OTHER): Payer: Medicare Other

## 2021-12-19 ENCOUNTER — Encounter: Payer: Self-pay | Admitting: Internal Medicine

## 2021-12-19 VITALS — Ht 65.0 in | Wt 166.0 lb

## 2021-12-19 DIAGNOSIS — Z0000 Encounter for general adult medical examination without abnormal findings: Secondary | ICD-10-CM | POA: Diagnosis not present

## 2021-12-19 DIAGNOSIS — G5631 Lesion of radial nerve, right upper limb: Secondary | ICD-10-CM | POA: Insufficient documentation

## 2021-12-19 NOTE — Patient Instructions (Addendum)
?  Ms. Slavick , ?Thank you for taking time to come for your Medicare Wellness Visit. I appreciate your ongoing commitment to your health goals. Please review the following plan we discussed and let me know if I can assist you in the future.  ? ?These are the goals we discussed: ? Goals   ? ?  ? Patient Stated  ?   I need to work on portion control when eating (pt-stated)   ? ?  ?  ?This is a list of the screening recommended for you and due dates:  ?Health Maintenance  ?Topic Date Due  ? Tetanus Vaccine  02/20/2022*  ? Zoster (Shingles) Vaccine (1 of 2) 03/21/2022*  ? COVID-19 Vaccine (4 - Booster for Pfizer series) 04/22/2022*  ? Pap Smear  12/29/2022  ? Mammogram  01/29/2023  ? Colon Cancer Screening  04/13/2030  ? Flu Shot  Completed  ? Hepatitis C Screening: USPSTF Recommendation to screen - Ages 53-79 yo.  Completed  ? HIV Screening  Completed  ? HPV Vaccine  Aged Out  ?*Topic was postponed. The date shown is not the original due date.  ?  ?
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
## 2021-12-20 NOTE — Progress Notes (Signed)
See letter.

## 2021-12-25 ENCOUNTER — Other Ambulatory Visit: Payer: Self-pay | Admitting: Physical Medicine & Rehabilitation

## 2021-12-25 DIAGNOSIS — M5412 Radiculopathy, cervical region: Secondary | ICD-10-CM | POA: Diagnosis not present

## 2021-12-25 DIAGNOSIS — M542 Cervicalgia: Secondary | ICD-10-CM

## 2022-01-06 ENCOUNTER — Ambulatory Visit
Admission: RE | Admit: 2022-01-06 | Discharge: 2022-01-06 | Disposition: A | Discharge status: Home / Self Care | Payer: Medicare Other | Source: Ambulatory Visit | Attending: Physical Medicine & Rehabilitation | Admitting: Physical Medicine & Rehabilitation

## 2022-01-06 DIAGNOSIS — M542 Cervicalgia: Secondary | ICD-10-CM | POA: Diagnosis not present

## 2022-01-07 ENCOUNTER — Ambulatory Visit: Payer: Medicare Other | Admitting: Licensed Clinical Social Worker

## 2022-01-08 ENCOUNTER — Telehealth: Payer: Self-pay

## 2022-01-08 ENCOUNTER — Encounter: Payer: Self-pay | Admitting: Certified Registered Nurse Anesthetist

## 2022-01-08 DIAGNOSIS — M5441 Lumbago with sciatica, right side: Secondary | ICD-10-CM | POA: Diagnosis not present

## 2022-01-08 DIAGNOSIS — M5412 Radiculopathy, cervical region: Secondary | ICD-10-CM | POA: Diagnosis not present

## 2022-01-08 DIAGNOSIS — M542 Cervicalgia: Secondary | ICD-10-CM | POA: Diagnosis not present

## 2022-01-08 DIAGNOSIS — M9931 Osseous stenosis of neural canal of cervical region: Secondary | ICD-10-CM | POA: Diagnosis not present

## 2022-01-08 NOTE — Telephone Encounter (Signed)
Patient on wait list.  Rescheduled her EGD from 5-11 to this Friday, 01-10-22.  Will send new instructions via MyChart.  ?

## 2022-01-10 ENCOUNTER — Ambulatory Visit (AMBULATORY_SURGERY_CENTER): Payer: Medicare Other | Admitting: Gastroenterology

## 2022-01-10 ENCOUNTER — Encounter: Payer: Self-pay | Admitting: Gastroenterology

## 2022-01-10 VITALS — BP 135/72 | HR 69 | Temp 97.8°F | Resp 16 | Ht 65.0 in | Wt 166.0 lb

## 2022-01-10 DIAGNOSIS — I10 Essential (primary) hypertension: Secondary | ICD-10-CM | POA: Diagnosis not present

## 2022-01-10 DIAGNOSIS — R1310 Dysphagia, unspecified: Secondary | ICD-10-CM

## 2022-01-10 MED ORDER — SODIUM CHLORIDE 0.9 % IV SOLN: 500.0000 mL | Freq: Once | INTRAVENOUS | Status: DC

## 2022-01-10 NOTE — Progress Notes (Signed)
Called to room to assist during endoscopic procedure.  Patient ID and intended procedure confirmed with present staff. Received instructions for my participation in the procedure from the performing physician.  

## 2022-01-10 NOTE — Progress Notes (Signed)
1408 Robinul 0.1 mg IV given due large amount of secretions upon assessment.  MD made aware, vss 

## 2022-01-10 NOTE — Progress Notes (Signed)
Report given to PACU, vss 

## 2022-01-10 NOTE — Progress Notes (Signed)
Pt's states no medical or surgical changes since previsit or office visit. 

## 2022-01-10 NOTE — Op Note (Signed)
[facility] ?Patient Name: [name] ?Procedure Date: 01/10/2022 2:07 PM ?MRN: [id number] ?Endoscopist: Willaim Rayas. Adela Lank , MD ?Age: 66 ?Referring MD:  ?Date of Birth: Sep 04, 1956 ?Gender: Female ?Account #: [id number] ?Procedure:                Upper GI endoscopy ?Indications:              Dysphagia - prior evaluation with EGD x 2, barium  ?                          swallow, manometry without clear cause - but has  ?                          not had empiric dilation or biopsies to rule out EoE ?Medicines:                Monitored Anesthesia Care ?Procedure:                Pre-Anesthesia Assessment: ?                          - Prior to the procedure, a History and Physical  ?                          was performed, and patient medications and  ?                          allergies were reviewed. The patient's tolerance of  ?                          previous anesthesia was also reviewed. The risks  ?                          and benefits of the procedure and the sedation  ?                          options and risks were discussed with the patient.  ?                          All questions were answered, and informed consent  ?                          was obtained. Prior Anticoagulants: The patient has  ?                          taken no previous anticoagulant or antiplatelet  ?                          agents. ASA Grade Assessment: II - A patient with  ?                          mild systemic disease. After reviewing the risks  ?                          and benefits, the patient was deemed in  ?  satisfactory condition to undergo the procedure. ?                          After obtaining informed consent, the endoscope was  ?                          passed under direct vision. Throughout the  ?                          procedure, the patient's blood pressure, pulse, and  ?                          oxygen saturations were monitored continuously. The  ?                           Endoscope was introduced through the mouth, and  ?                          advanced to the second part of duodenum. The upper  ?                          GI endoscopy was accomplished without difficulty.  ?                          The patient tolerated the procedure well. ?Scope In: ?Scope Out: ?Findings:                 Esophagogastric landmarks were identified: the  ?                          Z-line was found at 40 cm, the gastroesophageal  ?                          junction was found at 40 cm and the upper extent of  ?                          the gastric folds was found at 40 cm from the  ?                          incisors. ?                          The exam of the esophagus was otherwise normal. No  ?                          focal stenosis / stricture noted, no inflammatory  ?                          changes. ?                          A guidewire was placed and the scope was withdrawn.  ?                          Empiric  dilation was performed in the entire  ?                          esophagus with a Savary dilator with mild  ?                          resistance at 17 mm and 18 mm. Relook endoscopy  ?                          showed no mucosal wrents. Biopsies were taken with  ?                          a cold forceps in the upper third of the esophagus,  ?                          in the middle third of the esophagus and in the  ?                          lower third of the esophagus for histology. ?                          The gastric body was rather angulated. The examined  ?                          stomach was otherwise normal. ?                          The duodenal bulb and second portion of the  ?                          duodenum were normal. ?Complications:            No immediate complications. Estimated blood loss:  ?                          Minimal. ?Estimated Blood Loss:     Estimated blood loss was minimal. ?Impression:               - Esophagogastric landmarks identified. ?                           - Normal esophagus - no focal source of dysphagia  ?                          but empiric dilation performed and biopsies taken  ?                          to rule out EoE. ?                          - Normal stomach. ?                          - Normal duodenal bulb and second portion of the  ?  duodenum. ?Recommendation:           - Patient has a contact number available for  ?                          emergencies. The signs and symptoms of potential  ?                          delayed complications were discussed with the  ?                          patient. Return to normal activities tomorrow.  ?                          Written discharge instructions were provided to the  ?                          patient. ?                          - Resume previous diet. ?                          - Continue present medications. ?                          - Await pathology results and course post dilation ?Remo Lipps P. Havery Moros, MD ?01/10/2022 2:27:43 PM ?This report has been signed electronically. ?
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
## 2022-01-10 NOTE — Patient Instructions (Signed)
Await pathology results.   YOU HAD AN ENDOSCOPIC PROCEDURE TODAY AT THE [facility]:   Refer to the procedure report that was given to you for any specific questions about what was found during the examination.  If the procedure report does not answer your questions, please call your gastroenterologist to clarify.  If you requested that your care partner not be given the details of your procedure findings, then the procedure report has been included in a sealed envelope for you to review at your convenience later.  YOU SHOULD EXPECT: Some feelings of bloating in the abdomen. Passage of more gas than usual.  Walking can help get rid of the air that was put into your GI tract during the procedure and reduce the bloating. If you had a lower endoscopy (such as a colonoscopy or flexible sigmoidoscopy) you may notice spotting of blood in your stool or on the toilet paper. If you underwent a bowel prep for your procedure, you may not have a normal bowel movement for a few days.  Please Note:  You might notice some irritation and congestion in your nose or some drainage.  This is from the oxygen used during your procedure.  There is no need for concern and it should clear up in a day or so.  SYMPTOMS TO REPORT IMMEDIATELY:    Following upper endoscopy (EGD)  Vomiting of blood or coffee ground material  New chest pain or pain under the shoulder blades  Painful or persistently difficult swallowing  New shortness of breath  Fever of 100F or higher  Black, tarry-looking stools  For urgent or emergent issues, a gastroenterologist can be reached at any hour by calling [phone]. Do not use MyChart messaging for urgent concerns.    DIET:  We do recommend a small meal at first, but then you may proceed to your regular diet.  Drink plenty of fluids but you should avoid alcoholic beverages for 24 hours.  ACTIVITY:  You should plan to take it easy for the rest of today and you should NOT  DRIVE or use heavy machinery until tomorrow (because of the sedation medicines used during the test).    FOLLOW UP: Our staff will call the number listed on your records 48-72 hours following your procedure to check on you and address any questions or concerns that you may have regarding the information given to you following your procedure. If we do not reach you, we will leave a message.  We will attempt to reach you two times.  During this call, we will ask if you have developed any symptoms of COVID 19. If you develop any symptoms (ie: fever, flu-like symptoms, shortness of breath, cough etc.) before then, please call [phone].  If you test positive for Covid 19 in the 2 weeks post procedure, please call and report this information to us.    If any biopsies were taken you will be contacted by phone or by letter within the next 1-3 weeks.  Please call us at [phone] if you have not heard about the biopsies in 3 weeks.    SIGNATURES/CONFIDENTIALITY: You and/or your care partner have signed paperwork which will be entered into your electronic medical record.  These signatures attest to the fact that that the information above on your After Visit Summary has been reviewed and is understood.  Full responsibility of the confidentiality of this discharge information lies with you and/or your care-partner. 

## 2022-01-14 ENCOUNTER — Telehealth: Payer: Self-pay

## 2022-01-14 NOTE — Telephone Encounter (Signed)
?  Follow up Call- ? ? ?  01/10/2022  ?  1:32 PM  ?Call back number  ?Post procedure Call Back phone  # [phone]  ?Permission to leave phone message Yes  ?  ? ?Patient questions: ? ?Do you have a fever, pain , or abdominal swelling? No. ?Pain Score  0 * ? ?Have you tolerated food without any problems? Yes.   ? ?Have you been able to return to your normal activities? Yes.   ? ?Do you have any questions about your discharge instructions: ?Diet   No. ?Medications  No. ?Follow up visit  No. ? ?Do you have questions or concerns about your Care? No. ? ?Actions: ?* If pain score is 4 or above: ?No action needed, pain <4. ? ? ?

## 2022-01-16 ENCOUNTER — Encounter: Payer: Self-pay | Admitting: Gastroenterology

## 2022-01-20 ENCOUNTER — Encounter: Payer: Medicare Other | Admitting: Obstetrics and Gynecology

## 2022-01-21 DIAGNOSIS — M4802 Spinal stenosis, cervical region: Secondary | ICD-10-CM | POA: Diagnosis not present

## 2022-01-21 DIAGNOSIS — M6281 Muscle weakness (generalized): Secondary | ICD-10-CM | POA: Diagnosis not present

## 2022-01-24 DIAGNOSIS — M9931 Osseous stenosis of neural canal of cervical region: Secondary | ICD-10-CM | POA: Diagnosis not present

## 2022-01-29 ENCOUNTER — Encounter: Payer: Self-pay | Admitting: Internal Medicine

## 2022-01-29 ENCOUNTER — Encounter: Payer: Medicare Other | Admitting: Gastroenterology

## 2022-02-04 DIAGNOSIS — M4802 Spinal stenosis, cervical region: Secondary | ICD-10-CM | POA: Diagnosis not present

## 2022-02-04 DIAGNOSIS — M6281 Muscle weakness (generalized): Secondary | ICD-10-CM | POA: Diagnosis not present

## 2022-02-05 ENCOUNTER — Ambulatory Visit (INDEPENDENT_AMBULATORY_CARE_PROVIDER_SITE_OTHER): Payer: Medicare Other | Admitting: Licensed Clinical Social Worker

## 2022-02-05 ENCOUNTER — Encounter: Payer: Self-pay | Admitting: Licensed Clinical Social Worker

## 2022-02-05 DIAGNOSIS — F4312 Post-traumatic stress disorder, chronic: Secondary | ICD-10-CM

## 2022-02-05 NOTE — Plan of Care (Signed)
?  Problem: PTSD-Trauma Disorder CCP Problem  1 Reduce the negative impact trauma related symptoms have on social, occupational, and family functioning per pt self report 3 out of 5 sessions documented. Marland Kitchen ?Goal: STG:  Reduction in intrusive event recollections, avoidance of event reminders, intense arousal, or disinterest in activities or relationships 3 out of 5 sessions documented ?Outcome: Progressing ?Goal: LTG: Reduce frequency, intensity, and duration of depression symptoms as evidenced by pt self report 3 out of 5 sessions documented ?Outcome: Not Progressing ?Note: Pt reports higher levels of depression including fleeting SI ?Goal: LTG-Patient's behavior demonstrates decreased anxiety ?Outcome: Progressing ?Intervention: Encourage verbalization of feelings/concerns/expectations ?Note: Explored ?Intervention: REVIEW PLEASE SKILLS (TREAT PHYSICAL ILLNESS, BALANCE EATING, AVOID MOOD-ALTERING SUBSTANCES, BALANCE SLEEP AND GET EXERCISE) WITH [name] ?Note: Pt reports that she is trying daily ?Intervention: Monitor coping skills and behavior ?Note: Monitored ? ?Intervention: Encourage self-care activities ?Note: reviewed ?Intervention: Assist with relaxation techniques, as appropriate (deep breathing exercises, meditation, guided imagery) ?Note: reviewed ?  ?

## 2022-02-05 NOTE — Progress Notes (Addendum)
Virtual Visit via Video Note ? ?I connected with [name] on 02/05/22 at  1:00 PM EDT by a video enabled telemedicine application and verified that I am speaking with the correct person using two identifiers. ? ?Location: ?Patient: home ?Provider: [facility] ?  ?I discussed the limitations of evaluation and management by telemedicine and the availability of in person appointments. The patient expressed understanding and agreed to proceed. ? ?I discussed the assessment and treatment plan with the patient. The patient was provided an opportunity to ask questions and all were answered. The patient agreed with the plan and demonstrated an understanding of the instructions. ?  ?The patient was advised to call back or seek an in-person evaluation if the symptoms worsen or if the condition fails to improve as anticipated. ? ?I provided 15 minutes of non-face-to-face time during this encounter. ? ? ?[name] R Jasmine Quiros, LCSW ? ?THERAPIST PROGRESS NOTE ? ?Session Time: 1-115p ? ?Participation Level: Active ? ?Behavioral Response: Neat and Well GroomedAlertAnxious ? ?Type of Therapy: Individual Therapy ? ?Treatment Goals addressed:  ?Problem: PTSD-Trauma Disorder CCP Problem  1 Reduce the negative impact trauma related symptoms have on social, occupational, and family functioning per pt self report 3 out of 5 sessions documented. Marland Kitchen ?Goal: STG:  Reduction in intrusive event recollections, avoidance of event reminders, intense arousal, or disinterest in activities or relationships 3 out of 5 sessions documented ?Outcome: Progressing ?Goal: LTG: Reduce frequency, intensity, and duration of depression symptoms as evidenced by pt self report 3 out of 5 sessions documented ?Outcome: Not Progressing ?Note: Pt reports higher levels of depression including fleeting SI ?Goal: LTG-Patient's behavior demonstrates decreased anxiety ?Outcome: Progressing ?Intervention: Encourage verbalization of feelings/concerns/expectations ?Note:  Explored ?Intervention: REVIEW PLEASE SKILLS (TREAT PHYSICAL ILLNESS, BALANCE EATING, AVOID MOOD-ALTERING SUBSTANCES, BALANCE SLEEP AND GET EXERCISE) WITH [name] ?Note: Pt reports that she is trying daily ?Intervention: Monitor coping skills and behavior ?Note: Monitored ? ?Intervention: Encourage self-care activities ?Note: reviewed ?Intervention: Assist with relaxation techniques, as appropriate (deep breathing exercises, meditation, guided imagery) ?Note: reviewed ? ?ProgressTowards Goals: Not Progressing/fluctuating  ? ?Interventions: CBT and Supportive ? ?Summary: [name] is a 66 y.o. female who presents with improving symptoms related to PTSD. Pt reports overall mood is stable and that she is managing stressors well.  ? ?Allowed pt to explore and express thoughts and feelings associated with recent life situations and external stressors. Pt reports that she had a depressive episode triggered by Mother's Day. Pt reports that she was missing her dog and her children on that day. Pt did mention that the next day, she felt an urge to run out into traffic. Pt states that she did get drunk, called her daughter, and told daughter that she wanted to kill herself. Pt states that the image/urge was fleeting and pt immediately recognized it as a thought but did not want to act on it.  ? ?Pt reports that she is being intentional about self care and self management behaviors: pt is going to the pool, walking/exercising on a regular basis, and using other coping skills. Discussed dangers of drinking excessively when feeling depressed/suicidal.  ? ?Continued recommendations are as follows: self care behaviors, positive social engagements, focusing on overall work/home/life balance, and focusing on positive physical and emotional wellness.  ? ?Suicidal/Homicidal: Yes--thoughts with no clear plan and no intent to follow through. Pt had fleeting thought to run out in trafffic while crossing the road ? ?Therapist Response:  Pt is continuing to apply interventions learned in  session into daily life situations.  Pt stated that she had fleeting suicidal thoughts which is indicative of worsening depression.  Pt is not interested in seeing a psychiatrist or discussing medication management of symptoms currently  ? ?Plan: Return again in 4 weeks. ? ?Diagnosis: Chronic post-traumatic stress disorder (PTSD) ? ?Collaboration of Care: Other pt encouraged to continue follow up appts with psychiatrist of record, Dr. Jomarie Longs ? ?Patient/Guardian was advised Release of Information must be obtained prior to any record release in order to collaborate their care with an outside provider. Patient/Guardian was advised if they have not already done so to contact the registration department to sign all necessary forms in order for [place] to release information regarding their care.  ? ?Consent: Patient/Guardian gives verbal consent for treatment and assignment of benefits for services provided during this visit. Patient/Guardian expressed understanding and agreed to proceed.  ? ?[name] R Jasmine Santibanez, LCSW ?02/05/2022 ? ?

## 2022-02-25 ENCOUNTER — Other Ambulatory Visit: Payer: Self-pay | Admitting: Physical Medicine & Rehabilitation

## 2022-02-25 DIAGNOSIS — M9931 Osseous stenosis of neural canal of cervical region: Secondary | ICD-10-CM | POA: Diagnosis not present

## 2022-02-25 DIAGNOSIS — G8929 Other chronic pain: Secondary | ICD-10-CM

## 2022-02-25 DIAGNOSIS — M5441 Lumbago with sciatica, right side: Secondary | ICD-10-CM | POA: Diagnosis not present

## 2022-02-25 DIAGNOSIS — M542 Cervicalgia: Secondary | ICD-10-CM | POA: Diagnosis not present

## 2022-02-25 DIAGNOSIS — M5412 Radiculopathy, cervical region: Secondary | ICD-10-CM | POA: Diagnosis not present

## 2022-03-05 ENCOUNTER — Ambulatory Visit
Admission: RE | Admit: 2022-03-05 | Discharge: 2022-03-05 | Disposition: A | Discharge status: Home / Self Care | Payer: Medicare Other | Source: Ambulatory Visit | Attending: Physical Medicine & Rehabilitation | Admitting: Physical Medicine & Rehabilitation

## 2022-03-05 ENCOUNTER — Encounter: Payer: Self-pay | Admitting: Gastroenterology

## 2022-03-05 DIAGNOSIS — M48061 Spinal stenosis, lumbar region without neurogenic claudication: Secondary | ICD-10-CM | POA: Diagnosis not present

## 2022-03-05 DIAGNOSIS — M5441 Lumbago with sciatica, right side: Secondary | ICD-10-CM | POA: Insufficient documentation

## 2022-03-05 DIAGNOSIS — M4802 Spinal stenosis, cervical region: Secondary | ICD-10-CM | POA: Diagnosis not present

## 2022-03-05 DIAGNOSIS — G8929 Other chronic pain: Secondary | ICD-10-CM | POA: Insufficient documentation

## 2022-03-05 DIAGNOSIS — M5127 Other intervertebral disc displacement, lumbosacral region: Secondary | ICD-10-CM | POA: Diagnosis not present

## 2022-03-06 ENCOUNTER — Telehealth: Payer: Medicare Other | Admitting: Internal Medicine

## 2022-03-13 DIAGNOSIS — M2578 Osteophyte, vertebrae: Secondary | ICD-10-CM | POA: Diagnosis not present

## 2022-03-13 DIAGNOSIS — M5412 Radiculopathy, cervical region: Secondary | ICD-10-CM | POA: Diagnosis not present

## 2022-03-13 DIAGNOSIS — M4722 Other spondylosis with radiculopathy, cervical region: Secondary | ICD-10-CM | POA: Diagnosis not present

## 2022-03-13 DIAGNOSIS — Z981 Arthrodesis status: Secondary | ICD-10-CM | POA: Diagnosis not present

## 2022-03-13 DIAGNOSIS — R292 Abnormal reflex: Secondary | ICD-10-CM | POA: Diagnosis not present

## 2022-03-21 ENCOUNTER — Ambulatory Visit: Payer: Medicare Other | Admitting: Licensed Clinical Social Worker

## 2022-03-23 DIAGNOSIS — J40 Bronchitis, not specified as acute or chronic: Secondary | ICD-10-CM | POA: Diagnosis not present

## 2022-03-23 DIAGNOSIS — Z20822 Contact with and (suspected) exposure to covid-19: Secondary | ICD-10-CM | POA: Diagnosis not present

## 2022-03-23 DIAGNOSIS — Z1152 Encounter for screening for COVID-19: Secondary | ICD-10-CM | POA: Diagnosis not present

## 2022-03-27 ENCOUNTER — Encounter: Payer: Medicare Other | Admitting: Obstetrics and Gynecology

## 2022-04-04 ENCOUNTER — Encounter: Payer: Self-pay | Admitting: Internal Medicine

## 2022-04-04 ENCOUNTER — Telehealth (INDEPENDENT_AMBULATORY_CARE_PROVIDER_SITE_OTHER): Payer: Medicare Other | Admitting: Internal Medicine

## 2022-04-04 DIAGNOSIS — J40 Bronchitis, not specified as acute or chronic: Secondary | ICD-10-CM

## 2022-04-04 DIAGNOSIS — R1310 Dysphagia, unspecified: Secondary | ICD-10-CM

## 2022-04-04 MED ORDER — ALBUTEROL SULFATE HFA 108 (90 BASE) MCG/ACT IN AERS: 1.0000 | INHALATION_SPRAY | Freq: Four times a day (QID) | RESPIRATORY_TRACT | 2 refills | Status: DC | PRN

## 2022-04-04 MED ORDER — HYDROCODONE BIT-HOMATROP MBR 5-1.5 MG/5ML PO SOLN: 5.0000 mL | Freq: Every evening | ORAL | 0 refills | Status: DC | PRN

## 2022-04-04 MED ORDER — DOXYCYCLINE HYCLATE 100 MG PO TABS: 100.0000 mg | ORAL_TABLET | Freq: Two times a day (BID) | ORAL | 0 refills | Status: DC
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
## 2022-04-09 ENCOUNTER — Other Ambulatory Visit: Payer: Self-pay

## 2022-04-09 DIAGNOSIS — M4802 Spinal stenosis, cervical region: Secondary | ICD-10-CM

## 2022-04-09 DIAGNOSIS — M5412 Radiculopathy, cervical region: Secondary | ICD-10-CM

## 2022-04-09 DIAGNOSIS — Z01818 Encounter for other preprocedural examination: Secondary | ICD-10-CM

## 2022-04-09 DIAGNOSIS — R490 Dysphonia: Secondary | ICD-10-CM | POA: Diagnosis not present

## 2022-04-18 ENCOUNTER — Ambulatory Visit (INDEPENDENT_AMBULATORY_CARE_PROVIDER_SITE_OTHER): Payer: Medicare Other | Admitting: Licensed Clinical Social Worker

## 2022-04-18 ENCOUNTER — Telehealth: Payer: Self-pay | Admitting: Internal Medicine

## 2022-04-18 DIAGNOSIS — F4312 Post-traumatic stress disorder, chronic: Secondary | ICD-10-CM | POA: Diagnosis not present

## 2022-04-18 NOTE — Plan of Care (Signed)
  Problem: PTSD-Trauma Disorder CCP Problem  1 Reduce the negative impact trauma related symptoms have on social, occupational, and family functioning per pt self report 3 out of 5 sessions documented. . Goal: STG:  Reduction in intrusive event recollections, avoidance of event reminders, intense arousal, or disinterest in activities or relationships 3 out of 5 sessions documented Outcome: Progressing Goal: LTG: Reduce frequency, intensity, and duration of depression symptoms as evidenced by pt self report 3 out of 5 sessions documented Outcome: Progressing Goal: LTG-Patient's behavior demonstrates decreased anxiety Outcome: Progressing   

## 2022-04-18 NOTE — Progress Notes (Signed)
Virtual Visit via Video Note  I connected with [name] on 04/18/22 at  8:00 AM EDT by a video enabled telemedicine application and verified that I am speaking with the correct person using two identifiers.  Location: Patient: home Provider: remote office [place], [place])   I discussed the limitations of evaluation and management by telemedicine and the availability of in person appointments. The patient expressed understanding and agreed to proceed.  I discussed the assessment and treatment plan with the patient. The patient was provided an opportunity to ask questions and all were answered. The patient agreed with the plan and demonstrated an understanding of the instructions.   The patient was advised to call back or seek an in-person evaluation if the symptoms worsen or if the condition fails to improve as anticipated.  I provided 30  minutes of non-face-to-face time during this encounter.   Asher Torpey R Jaielle Dlouhy, LCSW  THERAPIST PROGRESS NOTE  Session Time: 8-830a  Participation Level: Active  Behavioral Response: Neat and Well GroomedAlertAnxious  Type of Therapy: Individual Therapy  Treatment Goals addressed: Problem: PTSD-Trauma Disorder CCP Problem  1 Reduce the negative impact trauma related symptoms have on social, occupational, and family functioning per pt self report 3 out of 5 sessions documented. . Goal: STG:  Reduction in intrusive event recollections, avoidance of event reminders, intense arousal, or disinterest in activities or relationships 3 out of 5 sessions documented Outcome: Progressing Goal: LTG: Reduce frequency, intensity, and duration of depression symptoms as evidenced by pt self report 3 out of 5 sessions documented Outcome: Progressing Goal: LTG-Patient's behavior demonstrates decreased anxiety Outcome: Progressing   ProgressTowards Goals: Progressing/fluctuating   Interventions: CBT and Supportive  Summary: [name] is a 66 y.o. female  who presents with improving symptoms related to PTSD. Pt reports overall mood is stable and that she is managing stressors well.   Allowed pt to explore and express thoughts and feelings associated with recent life situations and external stressors. Pt reports that she recently returned from a trip to [place] to visit her daughter. Pt reports that it was a good visit, but she ended up with URI/coughing/sinus infection. Allowed pt to explore relationship with daughter and personal boundaries that pt has set with daughter.  Discussed psychological impact of dental needs--pt feels that due to her need of permanent tooth replacement in the front and having to wear temporary partials--pt feels it impacts her ability to develop and maintain personal relationships. Pt states that currently she can't afford crowns/bridges that would be more permanent solution. Provided pt with low-cost dental resources including local dental school.    Continued recommendations are as follows: self care behaviors, positive social engagements, focusing on overall work/home/life balance, and focusing on positive physical and emotional wellness.   Suicidal/Homicidal: Yes--thoughts with no clear plan and no intent to follow through. Pt had fleeting thought to run out in trafffic while crossing the road  Therapist Response: Pt is continuing to apply interventions learned in session into daily life situations.  Pt stated that she had fleeting suicidal thoughts which is indicative of worsening depression.  Pt is not interested in seeing a psychiatrist or discussing medication management of symptoms currently   Plan: Return again in 4 weeks.  Diagnosis: Chronic post-traumatic stress disorder (PTSD)  Collaboration of Care: Other pt encouraged to continue follow up appts with psychiatrist of record, Dr. Jomarie Longs  Patient/Guardian was advised Release of Information must be obtained prior to any record release in order to collaborate  their care with an outside provider. Patient/Guardian was advised if they have not already done so to contact the registration department to sign all necessary forms in order for [place] to release information regarding their care.   Consent: Patient/Guardian gives verbal consent for treatment and assignment of benefits for services provided during this visit. Patient/Guardian expressed understanding and agreed to proceed.   Ernest Haber [name], LCSW 04/18/2022

## 2022-04-18 NOTE — Telephone Encounter (Signed)
Pt came into office to drop off a link transit form for the provider to fill out. Placed in provider folder

## 2022-04-21 NOTE — Telephone Encounter (Signed)
Form ready will be brought tomorrow for scan can call pt ready

## 2022-04-21 NOTE — Plan of Care (Signed)
  Problem: PTSD-Trauma Disorder CCP Problem  1 Reduce the negative impact trauma related symptoms have on social, occupational, and family functioning per pt self report 3 out of 5 sessions documented. . Goal: STG:  Reduction in intrusive event recollections, avoidance of event reminders, intense arousal, or disinterest in activities or relationships 3 out of 5 sessions documented Outcome: Progressing Goal: LTG: Reduce frequency, intensity, and duration of depression symptoms as evidenced by pt self report 3 out of 5 sessions documented Outcome: Progressing Goal: LTG-Patient's behavior demonstrates decreased anxiety Outcome: Progressing

## 2022-04-22 ENCOUNTER — Encounter: Payer: Medicare Other | Admitting: Advanced Practice Midwife

## 2022-04-22 ENCOUNTER — Encounter: Payer: Self-pay | Admitting: Internal Medicine

## 2022-04-22 ENCOUNTER — Ambulatory Visit (INDEPENDENT_AMBULATORY_CARE_PROVIDER_SITE_OTHER): Payer: Medicare Other | Admitting: Internal Medicine

## 2022-04-22 VITALS — BP 122/80 | HR 72 | Temp 98.2°F | Ht 65.0 in | Wt 163.4 lb

## 2022-04-22 DIAGNOSIS — S90852A Superficial foreign body, left foot, initial encounter: Secondary | ICD-10-CM

## 2022-04-22 DIAGNOSIS — M419 Scoliosis, unspecified: Secondary | ICD-10-CM | POA: Diagnosis not present

## 2022-04-22 DIAGNOSIS — E039 Hypothyroidism, unspecified: Secondary | ICD-10-CM | POA: Diagnosis not present

## 2022-04-22 DIAGNOSIS — Z23 Encounter for immunization: Secondary | ICD-10-CM

## 2022-04-22 MED ORDER — MUPIROCIN 2 % EX OINT: 1.0000 | TOPICAL_OINTMENT | Freq: Two times a day (BID) | CUTANEOUS | 0 refills | Status: DC

## 2022-04-22 MED ORDER — AMLODIPINE BESYLATE 2.5 MG PO TABS: 2.5000 mg | ORAL_TABLET | Freq: Every day | ORAL | 3 refills | Status: DC

## 2022-04-22 MED ORDER — TETANUS-DIPHTH-ACELL PERTUSSIS 5-2.5-18.5 LF-MCG/0.5 IM SUSP: 0.5000 mL | Freq: Once | INTRAMUSCULAR | 0 refills | Status: AC

## 2022-04-22 MED ORDER — LEVOTHYROXINE SODIUM 175 MCG PO TABS
175.0000 ug | ORAL_TABLET | Freq: Every day | ORAL | 3 refills | Status: DC
Start: 2022-04-22 — End: 2023-06-09
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
## 2022-04-22 NOTE — Patient Instructions (Addendum)
Use antibacterial warm soapy soaks and bactroban  Let me know about your foot by Thursday   Mammogram was due 01/2022  [facility] reviews Mammography service in [place], [place] Get online care: [email or web address] Address: [address], [place], [place] [address] Hours:  Open ? Closes 5?PM Phone: [phone]  Thoracic Strain Rehab Ask your health care provider which exercises are safe for you. Do exercises exactly as told by your health care provider and adjust them as directed. It is normal to feel mild stretching, pulling, tightness, or discomfort as you do these exercises. Stop right away if you feel sudden pain or your pain gets worse. Do not begin these exercises until told by your health care provider. Stretching and range-of-motion exercise This exercise warms up your muscles and joints and improves the movement and flexibility of your back and shoulders. This exercise also helps to relieve pain. Chest and spine stretch  Lie down on your back on a firm surface. Roll a towel or a small blanket so it is about 4 inches (10 cm) in diameter. Put the towel lengthwise under the middle of your back so it is under your spine, but not under your shoulder blades. Put your hands behind your head and let your elbows fall to your sides. This will increase your stretch. Take a deep breath (inhale). Hold for __________ seconds. Relax after you breathe out (exhale). Repeat __________ times. Complete this exercise __________ times a day. Strengthening exercises These exercises build strength and endurance in your back and your shoulder blade muscles. Endurance is the ability to use your muscles for a long time, even after they get tired. Alternating arm and leg raises  Get on your hands and knees on a firm surface. If you are on a hard floor, you may want to use padding, such as an exercise mat, to cushion your knees. Line up your arms and  legs. Your hands should be directly below your shoulders, and your knees should be directly below your hips. Lift your left leg behind you. At the same time, raise your right arm and straighten it in front of you. Do not lift your leg higher than your hip. Do not lift your arm higher than your shoulder. Keep your abdominal and back muscles tight. Keep your hips facing the ground. Do not arch your back. Keep your balance carefully, and do not hold your breath. Hold for __________ seconds. Slowly return to the starting position and repeat with your right leg and your left arm. Repeat __________ times. Complete this exercise __________ times a day. Straight arm rows This exercise is also called shoulder extension exercise. Stand with your feet shoulder width apart. Secure an exercise band to a stable object in front of you so the band is at or above shoulder height. Hold one end of the exercise band in each hand. Straighten your elbows and lift your hands up to shoulder height. Step back, away from the secured end of the exercise band, until the band stretches. Squeeze your shoulder blades together and pull your hands down to the sides of your thighs. Stop when your hands are straight down by your sides. This is shoulder extension. Do not let your hands go behind your body. Hold for __________ seconds. Slowly return to the starting position. Repeat __________ times. Complete this exercise __________ times a day. Prone shoulder external rotation Lie on your abdomen on a firm bed so your left / right  forearm hangs over the edge of the bed and your upper arm is on the bed, straight out from your body. This is the prone position. Your elbow should be bent. Your palm should be facing your feet. If instructed, hold a __________ weight in your hand. Squeeze your shoulder blade toward the middle of your back. Do not let your shoulder lift toward your ear. Keep your elbow bent in a 90-degree angle  (right angle) while you slowly move your forearm up toward the ceiling. Move your forearm up to the height of the bed, toward your head. This is external rotation. Your upper arm should not move. At the top of the movement, your palm should face the floor. Hold for __________ seconds. Slowly return to the starting position and relax your muscles. Repeat __________ times. Complete this exercise __________ times a day. Rowing scapular retraction This is an exercise in which the shoulder blades (scapulae) are pulled toward each other (retraction). Sit in a stable chair without armrests, or stand up. Secure an exercise band to a stable object in front of you so the band is at shoulder height. Hold one end of the exercise band in each hand. Your palms should face down. Bring your arms out straight in front of you. Step back, away from the secured end of the exercise band, until the band stretches. Pull the band backward. As you do this, bend your elbows and squeeze your shoulder blades together, but avoid letting the rest of your body move. Do not shrug your shoulders upward while you do this. Stop when your elbows are at your sides or slightly behind your body. Hold for __________ seconds. Slowly straighten your arms to return to the starting position. Repeat __________ times. Complete this exercise __________ times a day. Posture and body mechanics Good posture and healthy body mechanics can help to relieve stress in your body's tissues and joints. Body mechanics refers to the movements and positions of your body while you do your daily activities. Posture is part of body mechanics. Good posture means: Your spine is in its natural S-curve position (neutral). Your shoulders are pulled back slightly. Your head is not tipped forward. Follow these guidelines to improve your posture and body mechanics in your everyday activities. Standing  When standing, keep your spine neutral and your feet about  hip width apart. Keep a slight bend in your knees. Your ears, shoulders, and hips should line up with each other. When you do a task in which you lean forward while standing in one place for a long time, place one foot up on a stable object that is 2-4 inches (5-10 cm) high, such as a footstool. This helps keep your spine neutral. Sitting  When sitting, keep your spine neutral and keep your feet flat on the floor. Use a footrest, if necessary, and keep your thighs parallel to the floor. Avoid rounding your shoulders, and avoid tilting your head forward. When working at a desk or a computer, keep your desk at a height where your hands are slightly lower than your elbows. Slide your chair under your desk so you are close enough to maintain good posture. When working at a computer, place your monitor at a height where you are looking straight ahead and you do not have to tilt your head forward or downward to look at the screen. Resting When lying down and resting, avoid positions that are most painful for you. If you have pain with activities such as sitting,  bending, stooping, or squatting (flexion-basedactivities), lie in a position in which your body does not bend very much. For example, avoid curling up on your side with your arms and knees near your chest (fetal position). If you have pain with activities such as standing for a long time or reaching with your arms (extension-basedactivities), lie with your spine in a neutral position and bend your knees slightly. Try the following positions: Lie on your side with a pillow between your knees. Lie on your back with a pillow under your knees.  Lifting  When lifting objects, keep your feet at least shoulder width apart and tighten your abdominal muscles. Bend your knees and hips and keep your spine neutral. It is important to lift using the strength of your legs, not your back. Do not lock your knees straight out. Always ask for help to lift heavy or  awkward objects. This information is not intended to replace advice given to you by your health care provider. Make sure you discuss any questions you have with your health care provider. Document Revised: 12/31/2018 Document Reviewed: 10/18/2018 Elsevier Patient Education  2023 [employer] Inc.  Scoliosis  Scoliosis is a condition in which the spine curves sideways. Normally, the spine does not curve side-to-side (laterally). With scoliosis, the spine may curve to the left, to the right, or in both directions. The curve of the spine is measured by angles in degrees. Scoliosis can affect people at any age, but it is more common among children and adolescents. What are the causes? The cause of scoliosis is not always known. It may be caused by: A birth defect. A disease that can cause problems in the muscles or imbalance of the body, such as cerebral palsy or muscular dystrophy. What are the signs or symptoms? This condition may not cause any symptoms. If you do have symptoms, they may include: Leaning to one side. Sunken chest and uneven shoulders. One side of the body being different or larger than the other side (asymmetry). An abnormal curve in the back. Pain, which may limit physical activity. Shortness of breath. Bowel or bladder control problems, such as not knowing when you have to go. This can be a sign of nerve damage. How is this diagnosed? This condition is diagnosed based on: Your medical history. Your symptoms. A physical exam. This may include: Examining your nerves, muscles, and reflexes (neurological exam). Testing the movement of your spine (range of motion study). Imaging tests, such as: X-rays. MRI. How is this treated? Treatment for this condition depends on the severity of the symptoms. Treatment may include: Observation to make sure that your scoliosis does not get worse (progress). You may need to have regular visits with your health care provider. A back brace  to prevent scoliosis from progressing. This may be needed during times of fast growth (growth spurts), such as during adolescence. Medicine to help relieve pain. Physical therapy. Surgery. Follow these instructions at home: If you have a brace: Wear the brace as told by your health care provider. Remove it only as told by your health care provider. Loosen the brace if your fingers or toes tingle, become numb, or turn cold and blue. Keep the brace clean. If the brace is not waterproof: Do not let it get wet. Cover it with a watertight covering when you take a bath or a shower. General instructions Take over-the-counter and prescription medicines only as told by your health care provider. Do not drive or use heavy machinery while taking  prescription pain medicine. If physical therapy was prescribed, do exercises as instructed. Before starting any new sports or physical activities, ask your health care provider whether they are safe for you. Keep all follow-up visits as told by your health care provider. This is important. Contact a health care provider if you have: Problems with your back brace, such as skin irritation or discomfort. Back pain that does not get better with medicine. Get help right away if: Your legs feel weak. You cannot move your legs. You cannot control when you urinate or pass stool (loss of bladder or bowel control). Summary Scoliosis is a condition of having a spine that curves sideways. The spine may curve to the left, to the right, or in both directions. This condition may be caused by birth defects or diseases that affect muscles and body balance. Follow your health care provider's instructions about wearing a brace, doing physical activities, and keeping follow-up visits. This information is not intended to replace advice given to you by your health care provider. Make sure you discuss any questions you have with your health care provider. Document Revised:  08/05/2021 Document Reviewed: 07/10/2021 Elsevier Patient Education  2023 [employer].

## 2022-04-24 ENCOUNTER — Other Ambulatory Visit: Payer: Self-pay

## 2022-04-24 ENCOUNTER — Emergency Department
Admission: EM | Admit: 2022-04-24 | Discharge: 2022-04-24 | Disposition: A | Discharge status: Home / Self Care | Payer: Medicare Other | Attending: Emergency Medicine | Admitting: Emergency Medicine

## 2022-04-24 DIAGNOSIS — S39012A Strain of muscle, fascia and tendon of lower back, initial encounter: Secondary | ICD-10-CM | POA: Diagnosis not present

## 2022-04-24 DIAGNOSIS — E039 Hypothyroidism, unspecified: Secondary | ICD-10-CM | POA: Insufficient documentation

## 2022-04-24 DIAGNOSIS — X58XXXA Exposure to other specified factors, initial encounter: Secondary | ICD-10-CM | POA: Insufficient documentation

## 2022-04-24 DIAGNOSIS — S76011A Strain of muscle, fascia and tendon of right hip, initial encounter: Secondary | ICD-10-CM | POA: Insufficient documentation

## 2022-04-24 DIAGNOSIS — S3992XA Unspecified injury of lower back, initial encounter: Secondary | ICD-10-CM | POA: Diagnosis present

## 2022-04-24 DIAGNOSIS — I10 Essential (primary) hypertension: Secondary | ICD-10-CM | POA: Insufficient documentation

## 2022-04-24 MED ORDER — LIDOCAINE 5 % EX PTCH: 1.0000 | MEDICATED_PATCH | Freq: Two times a day (BID) | CUTANEOUS | 0 refills | Status: DC

## 2022-04-24 MED ORDER — KETOROLAC TROMETHAMINE 15 MG/ML IJ SOLN
15.0000 mg | Freq: Once | INTRAMUSCULAR | Status: AC
  Administered 2022-04-24: 15 mg via INTRAMUSCULAR
  Filled 2022-04-24: qty 1

## 2022-04-24 MED ORDER — NAPROXEN 500 MG PO TABS: 500.0000 mg | ORAL_TABLET | Freq: Two times a day (BID) | ORAL | 0 refills | Status: DC

## 2022-04-24 NOTE — ED Triage Notes (Addendum)
Pt in from home due to lower R sided back pain starting yesterday; changing position makes it worse per pt; tried heating pad and ice last night without relief; denies mechanism of injury; heavy lifting; then states when in pool yesterday felt pain in R Leg and started having back pain after that; spine surgery for fusion set for the 21st; no history of sciatica per pt. Pt denies any changes with urination.

## 2022-04-24 NOTE — ED Provider Notes (Signed)
[facility] Provider Note    Event Date/Time   First MD Initiated Contact with Patient 04/24/22 1109     (approximate)   History   Chief Complaint: Back Pain (/)   HPI  [name] is a 66 y.o. female with a history of hypothyroidism, hypertension, anxiety who comes the ED complaining of pain in the right gluteal region that started yesterday while doing exercises in a pool.  Worse with movement.  Took Tylenol and tried ice and heat last night without relief.  No significant radiation.  No weakness in the lower extremity or paresthesia.  No bowel or bladder incontinence or retention.  No fever or dysuria     Physical Exam   Triage Vital Signs: ED Triage Vitals  Enc Vitals Group     BP 04/24/22 1050 113/78     Pulse Rate 04/24/22 1050 76     Resp 04/24/22 1050 18     Temp 04/24/22 1050 98.3 F (36.8 C)     Temp Source 04/24/22 1050 Oral     SpO2 04/24/22 1050 99 %     Weight 04/24/22 1052 161 lb (73 kg)     Height 04/24/22 1052 5\' 5"  (1.651 m)     Head Circumference --      Peak Flow --      Pain Score 04/24/22 1052 10     Pain Loc --      Pain Edu? --      Excl. in GC? --     Most recent vital signs: Vitals:   04/24/22 1050  BP: 113/78  Pulse: 76  Resp: 18  Temp: 98.3 F (36.8 C)  SpO2: 99%    General: Awake, no distress.  CV:  Good peripheral perfusion.  Normal distal pulses Resp:  Normal effort.  Abd:  No distention.  Soft nontender Other:  Tenderness in the right gluteus reproducing her pain.  No inflammatory soft tissue changes.   ED Results / Procedures / Treatments   Labs (all labs ordered are listed, but only abnormal results are displayed) Labs Reviewed - No data to display   EKG    RADIOLOGY    PROCEDURES:  Procedures   MEDICATIONS ORDERED IN ED: Medications  ketorolac (TORADOL) 15 MG/ML injection 15 mg (has no administration in time range)     IMPRESSION / MDM / ASSESSMENT AND PLAN / ED COURSE   I reviewed the triage vital signs and the nursing notes.                               Patient presents with pain in the right gluteus, reproducible on exam and consistent with muscle strain.  Doubt vertebral or spinal pathology.  No signs of infection or intra-abdominal pathology.  Will treat with NSAIDs, Lidoderm.       FINAL CLINICAL IMPRESSION(S) / ED DIAGNOSES   Final diagnoses:  Muscle strain of right gluteal region, initial encounter     Rx / DC Orders   ED Discharge Orders          Ordered    naproxen (NAPROSYN) 500 MG tablet  2 times daily with meals        04/24/22 1137    lidocaine (LIDODERM) 5 %  Every 12 hours        04/24/22 1137             Note:  This document  was prepared using Conservation officer, historic buildings and may include unintentional dictation errors.   Sharman Cheek, MD 04/24/22 1139

## 2022-04-24 NOTE — ED Notes (Signed)
See triage note  Presents with right sided back pain which radiates into buttock area   States pain started yesterday  Denies any injury  ambulates with slight limp d/t pain

## 2022-05-07 ENCOUNTER — Encounter
Admission: RE | Admit: 2022-05-07 | Discharge: 2022-05-07 | Disposition: A | Discharge status: Home / Self Care | Payer: Medicare Other | Source: Ambulatory Visit | Attending: Neurosurgery | Admitting: Neurosurgery

## 2022-05-07 VITALS — BP 132/92 | HR 99 | Resp 16 | Ht 65.0 in | Wt 165.1 lb

## 2022-05-07 DIAGNOSIS — I10 Essential (primary) hypertension: Secondary | ICD-10-CM | POA: Insufficient documentation

## 2022-05-07 DIAGNOSIS — Z01818 Encounter for other preprocedural examination: Secondary | ICD-10-CM | POA: Diagnosis not present

## 2022-05-07 HISTORY — DX: Anemia, unspecified: D64.9

## 2022-05-07 HISTORY — DX: Gastro-esophageal reflux disease without esophagitis: K21.9

## 2022-05-07 HISTORY — DX: Bronchitis, not specified as acute or chronic: J40

## 2022-05-07 LAB — SURGICAL PCR SCREEN
MRSA, PCR: NEGATIVE
Staphylococcus aureus: POSITIVE — AB

## 2022-05-07 LAB — TYPE AND SCREEN
ABO/RH(D): B POS
Antibody Screen: NEGATIVE

## 2022-05-07 LAB — BASIC METABOLIC PANEL
Anion gap: 4 — ABNORMAL LOW (ref 5–15)
BUN: 21 mg/dL (ref 8–23)
CO2: 27 mmol/L (ref 22–32)
Calcium: 9 mg/dL (ref 8.9–10.3)
Chloride: 107 mmol/L (ref 98–111)
Creatinine, Ser: 0.64 mg/dL (ref 0.44–1.00)
GFR, Estimated: >60 mL/min (ref >60)
Glucose, Bld: 95 mg/dL (ref 70–99)
Potassium: 3.9 mmol/L (ref 3.5–5.1)
Sodium: 138 mmol/L (ref 135–145)

## 2022-05-07 LAB — CBC
HCT: 36 % (ref 36.0–46.0)
Hemoglobin: 11.8 g/dL — ABNORMAL LOW (ref 12.0–15.0)
MCH: 28.3 pg (ref 26.0–34.0)
MCHC: 32.8 g/dL (ref 30.0–36.0)
MCV: 86.3 fL (ref 80.0–100.0)
Platelets: 225 10*3/uL (ref 150–400)
RBC: 4.17 MIL/uL (ref 3.87–5.11)
RDW: 12.6 % (ref 11.5–15.5)
WBC: 4.4 10*3/uL (ref 4.0–10.5)
nRBC: 0 % (ref 0.0–0.2)

## 2022-05-07 NOTE — Patient Instructions (Addendum)
Your procedure is scheduled on:05-12-22 Monday Report to the Registration Desk on the 1st floor of the [facility]. To find out your arrival time, please call [phone] between 1PM - 3PM on:05-09-22 Friday If your arrival time is 6:00 am, do not arrive prior to that time as the [facility] entrance doors do not open until 6:00 am.  REMEMBER: Instructions that are not followed completely may result in serious medical risk, up to and including death; or upon the discretion of your surgeon and anesthesiologist your surgery may need to be rescheduled.  Do not eat food after midnight the night before surgery.  No gum chewing, lozengers or hard candies.  You may however, drink CLEAR liquids up to 2 hours before you are scheduled to arrive for your surgery. Do not drink anything within 2 hours of your scheduled arrival time.  Clear liquids include: - water  - apple juice without pulp - gatorade (not RED colors) - black coffee or tea (Do NOT add milk or creamers to the coffee or tea) Do NOT drink anything that is not on this list.  TAKE THESE MEDICATIONS THE MORNING OF SURGERY WITH A SIP OF WATER: -amLODipine (NORVASC -levothyroxine (SYNTHROID) -famotidine (PEPCID)   Bring Albuterol inhaler to the hospital the day of surgery  One week prior to surgery: Stop Anti-inflammatories (NSAIDS) such as Advil, Aleve, Ibuprofen, Motrin, Naproxen, Naprosyn and Aspirin based products such as Excedrin, Goodys Powder, BC Powder.You may however, continue to take Tylenol if needed for pain up until the day of surgery.  Stop ANY OVER THE COUNTER supplements/vitamins NOW (05-07-22) until after surgery (Vitamin C, Calcium, Multivitamin, Probiotic, Turmeric-Ginger, Vitamin E and Vitamin D)-You may continue your Melatonin up until the day prior to your surgery  No Alcohol for 24 hours before or after surgery.  No Smoking including e-cigarettes for 24 hours prior to surgery.  No chewable tobacco products for  at least 6 hours prior to surgery.  No nicotine patches on the day of surgery.  Do not use any "recreational" drugs for at least a week prior to your surgery.  Please be advised that the combination of cocaine and anesthesia may have negative outcomes, up to and including death. If you test positive for cocaine, your surgery will be cancelled.  On the morning of surgery brush your teeth with toothpaste and water, you may rinse your mouth with mouthwash if you wish. Do not swallow any toothpaste or mouthwash.  Use CHG Soap as directed on instruction sheet.  Do not wear jewelry, make-up, hairpins, clips or nail polish.  Do not wear lotions, powders, or perfumes.   Do not shave body from the neck down 48 hours prior to surgery just in case you cut yourself which could leave a site for infection.  Also, freshly shaved skin may become irritated if using the CHG soap.  Contact lenses, hearing aids and dentures may not be worn into surgery.  Do not bring valuables to the hospital. [facility] is not responsible for any missing/lost belongings or valuables.   Notify your doctor if there is any change in your medical condition (cold, fever, infection).  Wear comfortable clothing (specific to your surgery type) to the hospital.  After surgery, you can help prevent lung complications by doing breathing exercises.  Take deep breaths and cough every 1-2 hours. Your doctor may order a device called an Incentive Spirometer to help you take deep breaths. When coughing or sneezing, hold a pillow firmly against your incision  with both hands. This is called "splinting." Doing this helps protect your incision. It also decreases belly discomfort.  If you are being admitted to the hospital overnight, leave your suitcase in the car. After surgery it may be brought to your room.  If you are being discharged the day of surgery, you will not be allowed to drive home. You will need a responsible adult (18  years or older) to drive you home and stay with you that night.   If you are taking public transportation, you will need to have a responsible adult (18 years or older) with you. Please confirm with your physician that it is acceptable to use public transportation.   Please call the Pre-admissions Testing Dept. at [phone] if you have any questions about these instructions.  Surgery Visitation Policy:  Patients undergoing a surgery or procedure may have two family members or support persons with them as long as the person is not COVID-19 positive or experiencing its symptoms.   Inpatient Visitation:    Visiting hours are 7 a.m. to 8 p.m. Up to four visitors are allowed at one time in a patient room, including children. The visitors may rotate out with other people during the day. One designated support person (adult) may remain overnight.

## 2022-05-11 MED ORDER — LACTATED RINGERS IV SOLN: INTRAVENOUS | Status: DC

## 2022-05-11 MED ORDER — CHLORHEXIDINE GLUCONATE 0.12 % MT SOLN: 15.0000 mL | Freq: Once | OROMUCOSAL | Status: AC

## 2022-05-11 MED ORDER — ORAL CARE MOUTH RINSE: 15.0000 mL | Freq: Once | OROMUCOSAL | Status: AC

## 2022-05-11 MED ORDER — CEFAZOLIN SODIUM-DEXTROSE 2-4 GM/100ML-% IV SOLN
2.0000 g | Freq: Once | INTRAVENOUS | Status: AC
  Administered 2022-05-12: 2 g via INTRAVENOUS
  Filled 2022-05-11 (×3): qty 100

## 2022-05-12 ENCOUNTER — Ambulatory Visit: Payer: Medicare Other | Admitting: Certified Registered"

## 2022-05-12 ENCOUNTER — Other Ambulatory Visit: Payer: Self-pay

## 2022-05-12 ENCOUNTER — Encounter: Payer: Self-pay | Admitting: Neurosurgery

## 2022-05-12 ENCOUNTER — Observation Stay
Admission: RE | Admit: 2022-05-12 | Discharge: 2022-05-13 | Disposition: A | Discharge status: Home / Self Care | Payer: Medicare Other | Attending: Neurosurgery | Admitting: Neurosurgery

## 2022-05-12 ENCOUNTER — Ambulatory Visit: Payer: Medicare Other

## 2022-05-12 ENCOUNTER — Encounter
Admission: RE | Disposition: A | Discharge status: Home / Self Care | Payer: Self-pay | Source: Home / Self Care | Attending: Neurosurgery

## 2022-05-12 ENCOUNTER — Ambulatory Visit: Payer: Medicare Other | Admitting: Urgent Care

## 2022-05-12 DIAGNOSIS — M4802 Spinal stenosis, cervical region: Secondary | ICD-10-CM | POA: Diagnosis not present

## 2022-05-12 DIAGNOSIS — E039 Hypothyroidism, unspecified: Secondary | ICD-10-CM | POA: Diagnosis not present

## 2022-05-12 DIAGNOSIS — I10 Essential (primary) hypertension: Secondary | ICD-10-CM | POA: Diagnosis not present

## 2022-05-12 DIAGNOSIS — Z981 Arthrodesis status: Secondary | ICD-10-CM | POA: Diagnosis not present

## 2022-05-12 DIAGNOSIS — Z79899 Other long term (current) drug therapy: Secondary | ICD-10-CM | POA: Diagnosis not present

## 2022-05-12 DIAGNOSIS — M5412 Radiculopathy, cervical region: Secondary | ICD-10-CM | POA: Insufficient documentation

## 2022-05-12 DIAGNOSIS — Z01818 Encounter for other preprocedural examination: Secondary | ICD-10-CM

## 2022-05-12 DIAGNOSIS — Z8616 Personal history of COVID-19: Secondary | ICD-10-CM | POA: Insufficient documentation

## 2022-05-12 DIAGNOSIS — M4322 Fusion of spine, cervical region: Secondary | ICD-10-CM | POA: Diagnosis not present

## 2022-05-12 HISTORY — PX: ANTERIOR CERVICAL DECOMP/DISCECTOMY FUSION: SHX1161

## 2022-05-12 LAB — ABO/RH: ABO/RH(D): B POS

## 2022-05-12 SURGERY — ANTERIOR CERVICAL DECOMPRESSION/DISCECTOMY FUSION 2 LEVELS
Anesthesia: General | Site: Spine Cervical

## 2022-05-12 MED ORDER — LIDOCAINE HCL (PF) 2 % IJ SOLN
INTRAMUSCULAR | Status: AC
  Filled 2022-05-12: qty 5

## 2022-05-12 MED ORDER — KETOROLAC TROMETHAMINE 15 MG/ML IJ SOLN
15.0000 mg | Freq: Four times a day (QID) | INTRAMUSCULAR | Status: AC
  Administered 2022-05-12 – 2022-05-13 (×4): 15 mg via INTRAVENOUS
  Filled 2022-05-12 (×4): qty 1

## 2022-05-12 MED ORDER — PROPOFOL 10 MG/ML IV BOLUS
INTRAVENOUS | Status: DC | PRN
  Administered 2022-05-12: 150 mg via INTRAVENOUS

## 2022-05-12 MED ORDER — CYCLOBENZAPRINE HCL 10 MG PO TABS: 10.0000 mg | ORAL_TABLET | Freq: Three times a day (TID) | ORAL | Status: DC | PRN

## 2022-05-12 MED ORDER — ONDANSETRON HCL 4 MG/2ML IJ SOLN
INTRAMUSCULAR | Status: AC
  Filled 2022-05-12: qty 2

## 2022-05-12 MED ORDER — MELATONIN 5 MG PO TABS: 5.0000 mg | ORAL_TABLET | Freq: Every evening | ORAL | Status: DC | PRN

## 2022-05-12 MED ORDER — OXYCODONE HCL 5 MG PO TABS: 5.0000 mg | ORAL_TABLET | Freq: Once | ORAL | Status: DC | PRN

## 2022-05-12 MED ORDER — MIDAZOLAM HCL 5 MG/5ML IJ SOLN
INTRAMUSCULAR | Status: DC | PRN
  Administered 2022-05-12: 2 mg via INTRAVENOUS

## 2022-05-12 MED ORDER — ONDANSETRON HCL 4 MG/2ML IJ SOLN: 4.0000 mg | Freq: Four times a day (QID) | INTRAMUSCULAR | Status: DC | PRN

## 2022-05-12 MED ORDER — ACETAMINOPHEN 10 MG/ML IV SOLN
INTRAVENOUS | Status: AC
  Filled 2022-05-12: qty 100

## 2022-05-12 MED ORDER — DEXTROSE 5 % IV SOLN
500.0000 mg | Freq: Once | INTRAVENOUS | Status: AC
Start: 2022-05-12 — End: 2022-05-12
  Administered 2022-05-12: 500 mg via INTRAVENOUS
  Filled 2022-05-12: qty 500

## 2022-05-12 MED ORDER — SODIUM CHLORIDE 0.9% FLUSH: 3.0000 mL | INTRAVENOUS | Status: DC | PRN

## 2022-05-12 MED ORDER — 0.9 % SODIUM CHLORIDE (POUR BTL) OPTIME
TOPICAL | Status: DC | PRN
  Administered 2022-05-12: 500 mL

## 2022-05-12 MED ORDER — FENTANYL CITRATE (PF) 100 MCG/2ML IJ SOLN
INTRAMUSCULAR | Status: AC
  Filled 2022-05-12: qty 2

## 2022-05-12 MED ORDER — BUPIVACAINE-EPINEPHRINE (PF) 0.5% -1:200000 IJ SOLN
INTRAMUSCULAR | Status: AC
Start: 2022-05-12 — End: ?
  Filled 2022-05-12: qty 30

## 2022-05-12 MED ORDER — LEVOTHYROXINE SODIUM 50 MCG PO TABS
175.0000 ug | ORAL_TABLET | Freq: Every day | ORAL | Status: DC
  Administered 2022-05-13: 175 ug via ORAL
  Filled 2022-05-12 (×2): qty 1

## 2022-05-12 MED ORDER — PHENOL 1.4 % MT LIQD: 1.0000 | OROMUCOSAL | Status: DC | PRN

## 2022-05-12 MED ORDER — CEFAZOLIN SODIUM-DEXTROSE 2-4 GM/100ML-% IV SOLN
INTRAVENOUS | Status: AC
  Filled 2022-05-12: qty 100

## 2022-05-12 MED ORDER — ALBUTEROL SULFATE (2.5 MG/3ML) 0.083% IN NEBU: 3.0000 mL | INHALATION_SOLUTION | Freq: Four times a day (QID) | RESPIRATORY_TRACT | Status: DC | PRN

## 2022-05-12 MED ORDER — OXYCODONE HCL 5 MG PO TABS
ORAL_TABLET | ORAL | Status: AC
  Administered 2022-05-12: 10 mg via ORAL
  Filled 2022-05-12: qty 2

## 2022-05-12 MED ORDER — PROPOFOL 10 MG/ML IV BOLUS
INTRAVENOUS | Status: AC
  Filled 2022-05-12: qty 20

## 2022-05-12 MED ORDER — FENTANYL CITRATE (PF) 100 MCG/2ML IJ SOLN
25.0000 ug | INTRAMUSCULAR | Status: DC | PRN
  Administered 2022-05-12 (×2): 50 ug via INTRAVENOUS

## 2022-05-12 MED ORDER — FLEET ENEMA 7-19 GM/118ML RE ENEM: 1.0000 | ENEMA | Freq: Once | RECTAL | Status: DC | PRN

## 2022-05-12 MED ORDER — ACETAMINOPHEN 10 MG/ML IV SOLN
INTRAVENOUS | Status: DC | PRN
  Administered 2022-05-12: 1000 mg via INTRAVENOUS

## 2022-05-12 MED ORDER — LIDOCAINE HCL (CARDIAC) PF 100 MG/5ML IV SOSY
PREFILLED_SYRINGE | INTRAVENOUS | Status: DC | PRN
  Administered 2022-05-12: 100 mg via INTRAVENOUS

## 2022-05-12 MED ORDER — FENTANYL CITRATE (PF) 100 MCG/2ML IJ SOLN
INTRAMUSCULAR | Status: DC | PRN
  Administered 2022-05-12 (×2): 50 ug via INTRAVENOUS

## 2022-05-12 MED ORDER — DEXAMETHASONE SODIUM PHOSPHATE 10 MG/ML IJ SOLN
INTRAMUSCULAR | Status: DC | PRN
  Administered 2022-05-12: 10 mg via INTRAVENOUS

## 2022-05-12 MED ORDER — AMLODIPINE BESYLATE 5 MG PO TABS
2.5000 mg | ORAL_TABLET | ORAL | Status: DC
  Administered 2022-05-13: 2.5 mg via ORAL
  Filled 2022-05-12: qty 1

## 2022-05-12 MED ORDER — SODIUM CHLORIDE 0.9 % IV SOLN: INTRAVENOUS | Status: DC

## 2022-05-12 MED ORDER — POLYETHYLENE GLYCOL 3350 17 G PO PACK: 17.0000 g | PACK | Freq: Every day | ORAL | Status: DC | PRN

## 2022-05-12 MED ORDER — OXYCODONE HCL 5 MG PO TABS
10.0000 mg | ORAL_TABLET | ORAL | Status: DC | PRN
  Administered 2022-05-12: 10 mg via ORAL
  Filled 2022-05-12: qty 2

## 2022-05-12 MED ORDER — HYDROMORPHONE HCL 1 MG/ML IJ SOLN
0.5000 mg | INTRAMUSCULAR | Status: DC | PRN
  Administered 2022-05-12: 0.5 mg via INTRAVENOUS

## 2022-05-12 MED ORDER — SUCCINYLCHOLINE CHLORIDE 200 MG/10ML IV SOSY
PREFILLED_SYRINGE | INTRAVENOUS | Status: DC | PRN
  Administered 2022-05-12: 100 mg via INTRAVENOUS

## 2022-05-12 MED ORDER — FAMOTIDINE 20 MG PO TABS
20.0000 mg | ORAL_TABLET | Freq: Every day | ORAL | Status: DC
  Administered 2022-05-12: 20 mg via ORAL
  Filled 2022-05-12: qty 1

## 2022-05-12 MED ORDER — BISACODYL 10 MG RE SUPP: 10.0000 mg | Freq: Every day | RECTAL | Status: DC | PRN

## 2022-05-12 MED ORDER — ONDANSETRON HCL 4 MG/2ML IJ SOLN
4.0000 mg | Freq: Once | INTRAMUSCULAR | Status: AC | PRN
  Administered 2022-05-12: 4 mg via INTRAVENOUS

## 2022-05-12 MED ORDER — ONDANSETRON HCL 4 MG PO TABS: 4.0000 mg | ORAL_TABLET | Freq: Four times a day (QID) | ORAL | Status: DC | PRN

## 2022-05-12 MED ORDER — FENTANYL CITRATE (PF) 100 MCG/2ML IJ SOLN
INTRAMUSCULAR | Status: AC
  Administered 2022-05-12: 50 ug via INTRAVENOUS
  Filled 2022-05-12: qty 2

## 2022-05-12 MED ORDER — SENNA 8.6 MG PO TABS
1.0000 | ORAL_TABLET | Freq: Two times a day (BID) | ORAL | Status: DC
  Administered 2022-05-12 – 2022-05-13 (×3): 8.6 mg via ORAL
  Filled 2022-05-12 (×3): qty 1

## 2022-05-12 MED ORDER — ENOXAPARIN SODIUM 40 MG/0.4ML IJ SOSY
40.0000 mg | PREFILLED_SYRINGE | INTRAMUSCULAR | Status: DC
  Administered 2022-05-13: 40 mg via SUBCUTANEOUS
  Filled 2022-05-12: qty 0.4

## 2022-05-12 MED ORDER — PHENYLEPHRINE HCL (PRESSORS) 10 MG/ML IV SOLN
INTRAVENOUS | Status: AC
  Filled 2022-05-12: qty 1

## 2022-05-12 MED ORDER — PHENYLEPHRINE HCL-NACL 20-0.9 MG/250ML-% IV SOLN
INTRAVENOUS | Status: DC | PRN
  Administered 2022-05-12: 20 ug/min via INTRAVENOUS

## 2022-05-12 MED ORDER — MENTHOL 3 MG MT LOZG: 1.0000 | LOZENGE | OROMUCOSAL | Status: DC | PRN

## 2022-05-12 MED ORDER — PROPOFOL 500 MG/50ML IV EMUL
INTRAVENOUS | Status: DC | PRN
  Administered 2022-05-12: 150 ug/kg/min via INTRAVENOUS

## 2022-05-12 MED ORDER — SODIUM CHLORIDE 0.9 % IV SOLN
INTRAVENOUS | Status: DC | PRN
  Administered 2022-05-12: .02 ug/kg/min via INTRAVENOUS

## 2022-05-12 MED ORDER — PROPOFOL 1000 MG/100ML IV EMUL
INTRAVENOUS | Status: AC
  Filled 2022-05-12: qty 100

## 2022-05-12 MED ORDER — SODIUM CHLORIDE 0.9 % IV SOLN: 250.0000 mL | INTRAVENOUS | Status: DC

## 2022-05-12 MED ORDER — LACTATED RINGERS IV SOLN: INTRAVENOUS | Status: DC

## 2022-05-12 MED ORDER — DEXMEDETOMIDINE (PRECEDEX) IN NS 20 MCG/5ML (4 MCG/ML) IV SYRINGE
PREFILLED_SYRINGE | INTRAVENOUS | Status: DC | PRN
  Administered 2022-05-12 (×2): 8 ug via INTRAVENOUS
  Administered 2022-05-12: 4 ug via INTRAVENOUS

## 2022-05-12 MED ORDER — LACTATED RINGERS IV SOLN: INTRAVENOUS | Status: DC | PRN

## 2022-05-12 MED ORDER — BUPIVACAINE-EPINEPHRINE (PF) 0.5% -1:200000 IJ SOLN
INTRAMUSCULAR | Status: DC | PRN
  Administered 2022-05-12: 6 mL via PERINEURAL

## 2022-05-12 MED ORDER — MORPHINE SULFATE (PF) 2 MG/ML IV SOLN
1.0000 mg | INTRAVENOUS | Status: DC | PRN
  Administered 2022-05-12: 1 mg via INTRAVENOUS
  Filled 2022-05-12: qty 1

## 2022-05-12 MED ORDER — ACETAMINOPHEN 500 MG PO TABS
1000.0000 mg | ORAL_TABLET | Freq: Four times a day (QID) | ORAL | Status: DC
  Administered 2022-05-12 – 2022-05-13 (×3): 1000 mg via ORAL
  Filled 2022-05-12 (×4): qty 2

## 2022-05-12 MED ORDER — SURGIFLO WITH THROMBIN (HEMOSTATIC MATRIX KIT) OPTIME
TOPICAL | Status: DC | PRN
  Administered 2022-05-12: 1 via TOPICAL

## 2022-05-12 MED ORDER — OXYCODONE HCL 5 MG PO TABS: 5.0000 mg | ORAL_TABLET | ORAL | Status: DC | PRN

## 2022-05-12 MED ORDER — MIDAZOLAM HCL 2 MG/2ML IJ SOLN
INTRAMUSCULAR | Status: AC
  Filled 2022-05-12: qty 2

## 2022-05-12 MED ORDER — REMIFENTANIL HCL 1 MG IV SOLR
INTRAVENOUS | Status: AC
  Filled 2022-05-12: qty 1000

## 2022-05-12 MED ORDER — OXYCODONE HCL 5 MG/5ML PO SOLN: 5.0000 mg | Freq: Once | ORAL | Status: DC | PRN

## 2022-05-12 MED ORDER — ACETAMINOPHEN 10 MG/ML IV SOLN: 1000.0000 mg | Freq: Once | INTRAVENOUS | Status: DC | PRN

## 2022-05-12 MED ORDER — CHLORHEXIDINE GLUCONATE 0.12 % MT SOLN
OROMUCOSAL | Status: AC
  Administered 2022-05-12: 15 mL via OROMUCOSAL
  Filled 2022-05-12: qty 15

## 2022-05-12 MED ORDER — SEVOFLURANE IN SOLN
RESPIRATORY_TRACT | Status: AC
  Filled 2022-05-12: qty 250

## 2022-05-12 MED ORDER — SODIUM CHLORIDE 0.9% FLUSH
3.0000 mL | Freq: Two times a day (BID) | INTRAVENOUS | Status: DC
  Administered 2022-05-12 (×2): 3 mL via INTRAVENOUS

## 2022-05-12 MED ORDER — DOCUSATE SODIUM 100 MG PO CAPS
100.0000 mg | ORAL_CAPSULE | Freq: Two times a day (BID) | ORAL | Status: DC
  Administered 2022-05-12 – 2022-05-13 (×3): 100 mg via ORAL
  Filled 2022-05-12 (×3): qty 1

## 2022-05-12 MED ORDER — HYDROMORPHONE HCL 1 MG/ML IJ SOLN
INTRAMUSCULAR | Status: AC
  Administered 2022-05-12: 0.5 mg via INTRAVENOUS
  Filled 2022-05-12: qty 1

## 2022-05-12 SURGICAL SUPPLY — 55 items
ADH SKN CLS APL DERMABOND .7 (GAUZE/BANDAGES/DRESSINGS) ×1
AGENT HMST KT MTR STRL THRMB (HEMOSTASIS) ×1
ALLOGRAFT BONE FIBER KORE 1CC (Bone Implant) IMPLANT
BASIN KIT SINGLE STR (MISCELLANEOUS) ×1 IMPLANT
BULB RESERV EVAC DRAIN JP 100C (MISCELLANEOUS) IMPLANT
BUR NEURO DRILL SOFT 3.0X3.8M (BURR) ×1 IMPLANT
DERMABOND ADVANCED (GAUZE/BANDAGES/DRESSINGS) ×1
DERMABOND ADVANCED .7 DNX12 (GAUZE/BANDAGES/DRESSINGS) ×1 IMPLANT
DRAIN CHANNEL JP 10F RND 20C F (MISCELLANEOUS) IMPLANT
DRAPE C ARM PK CFD 31 SPINE (DRAPES) ×1 IMPLANT
DRAPE LAPAROTOMY 77X122 PED (DRAPES) ×1 IMPLANT
DRAPE MICROSCOPE SPINE 48X150 (DRAPES) ×1 IMPLANT
DRAPE SURG 17X11 SM STRL (DRAPES) ×1 IMPLANT
ELECT REM PT RETURN 9FT ADLT (ELECTROSURGICAL) ×1
ELECTRODE REM PT RTRN 9FT ADLT (ELECTROSURGICAL) ×1 IMPLANT
FEE INTRAOP CADWELL SUPPLY NCS (MISCELLANEOUS) IMPLANT
FEE INTRAOP MONITOR IMPULS NCS (MISCELLANEOUS) IMPLANT
GLOVE BIOGEL PI IND STRL 6.5 (GLOVE) ×2 IMPLANT
GLOVE BIOGEL PI IND STRL 8.5 (GLOVE) ×1 IMPLANT
GLOVE BIOGEL PI INDICATOR 6.5 (GLOVE) ×2
GLOVE BIOGEL PI INDICATOR 8.5 (GLOVE) ×1
GLOVE SURG SYN 6.5 ES PF (GLOVE) ×2 IMPLANT
GLOVE SURG SYN 6.5 PF PI (GLOVE) ×2 IMPLANT
GLOVE SURG SYN 8.5  E (GLOVE) ×3
GLOVE SURG SYN 8.5 E (GLOVE) ×3 IMPLANT
GLOVE SURG SYN 8.5 PF PI (GLOVE) ×3 IMPLANT
GOWN SRG LRG LVL 4 IMPRV REINF (GOWNS) ×2 IMPLANT
GOWN SRG XL LVL 3 NONREINFORCE (GOWNS) ×1 IMPLANT
GOWN STRL NON-REIN TWL XL LVL3 (GOWNS) ×1
GOWN STRL REIN LRG LVL4 (GOWNS) ×2
INTRAOP CADWELL SUPPLY FEE NCS (MISCELLANEOUS)
INTRAOP DISP SUPPLY FEE NCS (MISCELLANEOUS)
INTRAOP MONITOR FEE IMPULS NCS (MISCELLANEOUS)
INTRAOP MONITOR FEE IMPULSE (MISCELLANEOUS)
KIT TURNOVER KIT A (KITS) ×1 IMPLANT
MANIFOLD NEPTUNE II (INSTRUMENTS) ×1 IMPLANT
NDL SAFETY ECLIPSE 18X1.5 (NEEDLE) ×1 IMPLANT
NEEDLE HYPO 18GX1.5 SHARP (NEEDLE) ×1
NS IRRIG 500ML POUR BTL (IV SOLUTION) IMPLANT
PACK LAMINECTOMY NEURO (CUSTOM PROCEDURE TRAY) ×1 IMPLANT
PAD ARMBOARD 7.5X6 YLW CONV (MISCELLANEOUS) ×2 IMPLANT
PIN CASPAR 14 (PIN) ×1 IMPLANT
PIN CASPAR 14MM (PIN) ×1
PLATE ANT CERV XTEND 2 LV 26 (Plate) IMPLANT
SCREW VAR 4.2 XD SELF DRILL 16 (Screw) IMPLANT
SPACER C HEDRON 12X14 6 7D (Spacer) IMPLANT
SPACER C HEDRON 12X14 7M 7D (Spacer) IMPLANT
SPONGE KITTNER 5P (MISCELLANEOUS) ×1 IMPLANT
STAPLER SKIN PROX 35W (STAPLE) IMPLANT
SURGIFLO W/THROMBIN 8M KIT (HEMOSTASIS) ×1 IMPLANT
SUT V-LOC 90 ABS DVC 3-0 CL (SUTURE) ×1 IMPLANT
SUT VIC AB 3-0 SH 8-18 (SUTURE) ×1 IMPLANT
SYR 20ML LL LF (SYRINGE) ×1 IMPLANT
TAPE CLOTH 3X10 WHT NS LF (GAUZE/BANDAGES/DRESSINGS) ×3 IMPLANT
TRAP FLUID SMOKE EVACUATOR (MISCELLANEOUS) ×1 IMPLANT

## 2022-05-12 NOTE — Anesthesia Postprocedure Evaluation (Signed)
Anesthesia Post Note  Patient: [name]  Procedure(s) Performed: C5-7 ANTERIOR CERVICAL DISCECTOMY AND FUSION (Spine Cervical)  Patient location during evaluation: PACU Anesthesia Type: General Level of consciousness: awake and alert, oriented and patient cooperative Pain management: pain level controlled Vital Signs Assessment: post-procedure vital signs reviewed and stable Respiratory status: spontaneous breathing, nonlabored ventilation and respiratory function stable Cardiovascular status: blood pressure returned to baseline and stable Postop Assessment: adequate PO intake Anesthetic complications: no   No notable events documented.   Last Vitals:  Vitals:   05/12/22 1028 05/12/22 1044  BP: 134/88 (!) 135/92  Pulse: 66 70  Resp: 12 16  Temp: (!) 36.2 C 36.6 C  SpO2: 100% 100%    Last Pain:  Vitals:   05/12/22 1100  TempSrc:   PainSc: Asleep                 Reed Breech

## 2022-05-12 NOTE — Progress Notes (Signed)
Notified Dr Myer Haff of pt's complaint of chest pain and heaviness in her right hand. Received order for Ekg.

## 2022-05-12 NOTE — Discharge Instructions (Signed)
Your surgeon has performed an operation on your cervical spine (neck) to relieve pressure on the spinal cord and/or nerves. This involved making an incision in the front of your neck and removing one or more of the discs that support your spine. Next, a small piece of bone, a titanium plate, and screws were used to fuse two or more of the vertebrae (bones) together.  The following are instructions to help in your recovery once you have been discharged from the hospital. Even if you feel well, it is important that you follow these activity guidelines. If you do not let your neck heal properly from the surgery, you can increase the chance of return of your symptoms and other complications.  * Do not take anti-inflammatory medications for 3 months after surgery (naproxen [Aleve], ibuprofen [Advil, Motrin],. These medications can prevent your bones from healing properly. *talk with PCP about Vit D dose as chart indicated dose was too high  Activity    No bending, lifting, or twisting ("BLT"). Avoid lifting objects heavier than 10 pounds (gallon milk jug).  Where possible, avoid household activities that involve lifting, bending, reaching, pushing, or pulling such as laundry, vacuuming, grocery shopping, and childcare. Try to arrange for help from friends and family for these activities while your back heals.  Increase physical activity slowly as tolerated.  Taking short walks is encouraged, but avoid strenuous exercise. Do not jog, run, bicycle, lift weights, or participate in any other exercises unless specifically allowed by your doctor.  Talk to your doctor before resuming sexual activity.  You should not drive until cleared by your doctor.  Until released by your doctor, you should not return to work or school.  You should rest at home and let your body heal.   You may shower three days after your surgery.  After showering, lightly dab your incision dry. Do not take a tub bath or go swimming  until approved by your doctor at your follow-up appointment.  If your doctor ordered a cervical collar (neck brace) for you, you should wear it whenever you are out of bed. You may remove it when lying down or sleeping, but you should wear it at all other times. Not all neck surgeries require a cervical collar.  If you smoke, we strongly recommend that you quit.  Smoking has been proven to interfere with normal bone healing and will dramatically reduce the success rate of your surgery. Please contact [facility] (800-QUIT-NOW) and use the resources at www.[facility].com for assistance in stopping smoking.  Surgical Incision   Keep your incision area clean and dry.  Your incision was closed with Dermabond glue. The glue should begin to peel away within about a week.  Diet           You may return to your usual diet. However, you may experience discomfort when swallowing in the first month after your surgery. This is normal. You may find that softer foods are more comfortable for you to swallow. Be sure to stay hydrated.  When to Contact us  You may experience pain in your neck and/or pain between your shoulder blades. This is normal and should improve in the next few weeks with the help of pain medication, muscle relaxers, and rest. Some patients report that a warm compress on the back of the neck or between the shoulder blades helps.  However, should you experience any of the following, contact us immediately: New numbness or weakness Pain that is progressively getting worse, and  is not relieved by your pain medication, muscle relaxers, rest, and warm compresses Bleeding, redness, swelling, pain, or drainage from surgical incision Chills or flu-like symptoms Fever greater than 101.0 F (38.3 C) Inability to eat, drink fluids, or take medications Problems with bowel or bladder functions Difficulty breathing or shortness of breath Warmth, tenderness, or swelling in your calf Contact  Information During office hours (Monday-Friday 9 am to 5 pm), please call your physician at [phone] and ask for Sharlot Gowda After hours and weekends, please call [phone] and speak with the neurosurgeon on call For a life-threatening emergency, call 911

## 2022-05-12 NOTE — Op Note (Addendum)
Indications: Ms. Schall is a 66 yo female who presented with cervical radiculopathy and stenosis.  She failed conservative management prompting surgical intervention.  Findings: stenosis  Preoperative Diagnosis: Cervical radiculopathy, stenosis Postoperative Diagnosis: same   EBL: 25 ml IVF: see AR ml Drains: none Disposition: Extubated and Stable to PACU Complications: none  No foley catheter was placed.   Preoperative Note:   Risks of surgery discussed include: infection, bleeding, stroke, coma, death, paralysis, CSF leak, nerve/spinal cord injury, numbness, tingling, weakness, complex regional pain syndrome, recurrent stenosis and/or disc herniation, vascular injury, development of instability, neck/back pain, need for further surgery, persistent symptoms, development of deformity, and the risks of anesthesia. The patient understood these risks and agreed to proceed.  Operative Note:   Procedure:  1) Anterior cervical diskectomy and fusion at C5/6 and C6/7 2) Anterior cervical instrumentation at C5 - 7 using Globus Xtend 3) Placement of biomechanical devices at C5/6 and C6/7  4) Use of operative microscope 5) Use of flouroscopy   Procedure: After obtaining informed consent, the patient taken to the operating room, placed in supine position, general anesthesia induced.  The patient had a small shoulder roll placed behind their shoulders.  The patient received preop antibiotics and IV Decadron.  The patient had a neck incision outlined, was prepped and draped in usual sterile fashion. The incision was injected with local anesthetic.   An incision was opened, dissection taken down medial to the carotid artery and jugular vein, lateral to the trachea and esophagus.  The prevertebral fascia identified and the prior plate identified.  The left C5 screw was removed. The longus colli were dissected laterally, and self-retaining retractors placed to open the operative field. The microscope  was then brought into the field.  With this complete, distractor pins were placed in the vertebral bodies of C5 and C7. The distractor was placed, and the annuli at C5/6 and C6/7 were opened using a bovie.  Curettes and pituitary rongeurs used to remove the majority of disk, then the drill was used to remove the posterior osteophyte and begin the foraminotomies. The nerve hook was used to elevate the posterior longitudinal ligament, which was then removed with Kerrison rongeurs. The microblunt nerve hook could be passed out the foramina bilaterally at each level.   Meticulous hemostasis was obtained.  A biomechanical device (Globus Hedron 6 mm height x 14 mm width by 12 mm depth) was placed at C5/6. A second biomechanical device (Globus Hedron 7 mm height x 14 mm width by 12 mm depth) was placed at C6/7. Each device had been filled with allograft for aid in arthrodesis.  The caspar distractor was removed, and bone wax used for hemostasis. A separate, 26 mm Globus Xtend plate was chosen.  Two screws placed in each vertebral body, respectively making sure the screws were behind the locking mechanism.  Final AP and lateral radiographs were taken.   Please note that the plate is not inclusive to the biomechanical devices.  The anchoring mechanism of the plate is completely separate from the biomechanical devices.  With everything in good position, the wound was irrigated copiously and meticulous hemostasis obtained.  Wound was closed in 2 layers using interrupted inverted 3-0 Vicryl sutures in the platysma and 3-0 monocryl on the dermis.  The wound was dressed with dermabond, the head of bed at 30 degrees, taken to recovery room in stable condition.  No new postop neurological deficits were identified.  Sponge and pattie counts were correct at the end  of the procedure.     I performed the entire procedure with the assistance of Manning Charity PA as an Designer, television/film set. An assistant was required for this  procedure due to the complexity.  The assistant provided assistance in tissue manipulation and suction, and was required for the successful and safe performance of the procedure. I performed the critical portions of the procedure.   Venetia Night MD

## 2022-05-12 NOTE — Anesthesia Procedure Notes (Signed)
Procedure Name: Intubation Date/Time: 05/12/2022 7:41 AM  Performed by: Cheral Bay, CRNAPre-anesthesia Checklist: Patient identified, Emergency Drugs available, Suction available and Patient being monitored Patient Re-evaluated:Patient Re-evaluated prior to induction Oxygen Delivery Method: Circle system utilized Preoxygenation: Pre-oxygenation with 100% oxygen Induction Type: IV induction Ventilation: Mask ventilation without difficulty Laryngoscope Size: McGraph and 3 Grade View: Grade I Tube type: Oral Tube size: 7.0 mm Number of attempts: 1 Airway Equipment and Method: Stylet and Oral airway Placement Confirmation: ETT inserted through vocal cords under direct vision, positive ETCO2 and breath sounds checked- equal and bilateral Secured at: 20 cm Tube secured with: Tape Dental Injury: Teeth and Oropharynx as per pre-operative assessment
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
## 2022-05-12 NOTE — Anesthesia Preprocedure Evaluation (Addendum)
Anesthesia Evaluation  Patient identified by MRN, date of birth, ID band Patient awake    Reviewed: Allergy & Precautions, NPO status , Patient's Chart, lab work & pertinent test results  History of Anesthesia Complications Negative for: history of anesthetic complications  Airway Mallampati: I   Neck ROM: Full    Dental  (+) Partial Upper   Pulmonary asthma ,    Pulmonary exam normal breath sounds clear to auscultation       Cardiovascular hypertension, Normal cardiovascular exam Rhythm:Regular Rate:Normal  ECG 05/07/22: normal   Neuro/Psych PSYCHIATRIC DISORDERS (PTSD) Anxiety Depression negative neurological ROS     GI/Hepatic GERD  ,  Endo/Other  Hypothyroidism   Renal/GU negative Renal ROS     Musculoskeletal   Abdominal   Peds  Hematology negative hematology ROS (+)   Anesthesia Other Findings   Reproductive/Obstetrics                            Anesthesia Physical Anesthesia Plan  ASA: 2  Anesthesia Plan: General   Post-op Pain Management:    Induction: Intravenous  PONV Risk Score and Plan: 3 and Ondansetron, Dexamethasone and Treatment may vary due to age or medical condition  Airway Management Planned: Oral ETT  Additional Equipment:   Intra-op Plan:   Post-operative Plan: Extubation in OR  Informed Consent: I have reviewed the patients History and Physical, chart, labs and discussed the procedure including the risks, benefits and alternatives for the proposed anesthesia with the patient or authorized representative who has indicated his/her understanding and acceptance.     Dental advisory given  Plan Discussed with: CRNA  Anesthesia Plan Comments: (Patient consented for risks of anesthesia including but not limited to:  - adverse reactions to medications - damage to eyes, teeth, lips or other oral mucosa - nerve damage due to positioning  - sore throat or  hoarseness - damage to heart, brain, nerves, lungs, other parts of body or loss of life  Informed patient about role of CRNA in peri- and intra-operative care.  Patient voiced understanding.)        Anesthesia Quick Evaluation

## 2022-05-12 NOTE — Transfer of Care (Signed)
Immediate Anesthesia Transfer of Care Note  Patient: [name]  Procedure(s) Performed: C5-7 ANTERIOR CERVICAL DISCECTOMY AND FUSION (Spine Cervical)  Patient Location: PACU  Anesthesia Type:General  Level of Consciousness: drowsy  Airway & Oxygen Therapy: Patient Spontanous Breathing and Patient connected to face mask oxygen  Post-op Assessment: Report given to RN and Post -op Vital signs reviewed and stable  Post vital signs: Reviewed and stable  Last Vitals:  Vitals Value Taken Time  BP 107/73 05/12/22 0921  Temp 36.4 C 05/12/22 0921  Pulse 72 05/12/22 0925  Resp 22 05/12/22 0924  SpO2 100 % 05/12/22 0925  Vitals shown include unvalidated device data.  Last Pain:  Vitals:   05/12/22 0626  TempSrc: Temporal  PainSc: 7          Complications: No notable events documented.

## 2022-05-13 ENCOUNTER — Encounter: Payer: Self-pay | Admitting: Neurosurgery

## 2022-05-13 DIAGNOSIS — I10 Essential (primary) hypertension: Secondary | ICD-10-CM | POA: Diagnosis not present

## 2022-05-13 DIAGNOSIS — E039 Hypothyroidism, unspecified: Secondary | ICD-10-CM | POA: Diagnosis not present

## 2022-05-13 DIAGNOSIS — Z8616 Personal history of COVID-19: Secondary | ICD-10-CM | POA: Diagnosis not present

## 2022-05-13 DIAGNOSIS — M4802 Spinal stenosis, cervical region: Secondary | ICD-10-CM | POA: Diagnosis not present

## 2022-05-13 DIAGNOSIS — M5412 Radiculopathy, cervical region: Secondary | ICD-10-CM | POA: Diagnosis not present

## 2022-05-13 DIAGNOSIS — Z79899 Other long term (current) drug therapy: Secondary | ICD-10-CM | POA: Diagnosis not present

## 2022-05-13 LAB — TROPONIN I (HIGH SENSITIVITY)
Troponin I (High Sensitivity): 3 ng/L (ref <18)
Troponin I (High Sensitivity): 3 ng/L (ref <18)

## 2022-05-13 MED ORDER — CYCLOBENZAPRINE HCL 10 MG PO TABS: 10.0000 mg | ORAL_TABLET | Freq: Three times a day (TID) | ORAL | 0 refills | Status: DC | PRN

## 2022-05-13 MED ORDER — ACETAMINOPHEN 500 MG PO TABS
1000.0000 mg | ORAL_TABLET | Freq: Four times a day (QID) | ORAL | Status: AC
  Administered 2022-05-13: 1000 mg via ORAL
  Filled 2022-05-13: qty 2

## 2022-05-13 MED ORDER — SENNA 8.6 MG PO TABS: 1.0000 | ORAL_TABLET | Freq: Two times a day (BID) | ORAL | 0 refills | Status: DC | PRN

## 2022-05-13 MED ORDER — OXYCODONE HCL 5 MG PO TABS: 5.0000 mg | ORAL_TABLET | ORAL | 0 refills | Status: AC | PRN

## 2022-05-13 NOTE — Progress Notes (Signed)
    Attending Progress Note  History: [name] is s/p C5-7 ACDF   POD1: Some weakness and heaviness in right arm and HG overnight. Improved some this morning. Expected neck pain. Swallowing ok. Pt ambulating well independently.  Physical Exam: Vitals:   05/13/22 0423 05/13/22 0838  BP: (!) 128/94 (!) 154/93  Pulse: 78 74  Resp: 16 17  Temp: 98.3 F (36.8 C) 97.9 F (36.6 C)  SpO2: 100% 99%    AA Ox3 CNI  Strength:5/5 throughout BUE except 4/5 right HF WF and WE Incision c/d/I with dermabond in place  Data:  Recent Labs  Lab 05/07/22 1143  NA 138  K 3.9  CL 107  CO2 27  BUN 21  CREATININE 0.64  GLUCOSE 95  CALCIUM 9.0   No results for input(s): "AST", "ALT", "ALKPHOS" in the last 168 hours.  Invalid input(s): "TBILI"   Recent Labs  Lab 05/07/22 1143  WBC 4.4  HGB 11.8*  HCT 36.0  PLT 225   No results for input(s): "APTT", "INR" in the last 168 hours.       Other tests/results: none   Assessment/Plan:  [name] is a 66 y.o s/p C5-7 ACDF for cervical radiculopathy with improved arm pain post-op  - mobilize - pain control - DVT prophylaxis  Manning Charity PA-C Department of Neurosurgery

## 2022-05-13 NOTE — Plan of Care (Signed)
  Problem: Education: Goal: Knowledge of General Education information will improve Description: Including pain rating scale, medication(s)/side effects and non-pharmacologic comfort measures Outcome: Progressing   Problem: Health Behavior/Discharge Planning: Goal: Ability to manage health-related needs will improve Outcome: Progressing   Problem: Clinical Measurements: Goal: Will remain free from infection Outcome: Progressing   Problem: Activity: Goal: Risk for activity intolerance will decrease Outcome: Progressing   Problem: Elimination: Goal: Will not experience complications related to urinary retention Outcome: Progressing   Problem: Pain Managment: Goal: General experience of comfort will improve Outcome: Progressing   Problem: Safety: Goal: Ability to remain free from injury will improve Outcome: Progressing   Problem: Skin Integrity: Goal: Risk for impaired skin integrity will decrease Outcome: Progressing   

## 2022-05-13 NOTE — Progress Notes (Signed)
D/c order received, pt tolerating solid food well, has been ambulating throughout the night and this AM and has also been voiding.  AVS sheet to be printed and explained to patient, IVs taken out.  Will call ride for pt and will take pt to hospital entrance by wheelchair once ride is here.

## 2022-05-13 NOTE — Discharge Summary (Signed)
Physician Discharge Summary  Patient ID: [name] MRN: [id number] DOB/AGE: [birth year] 64 y.o.  Admit date: 05/12/2022 Discharge date: 05/13/2022  Admission Diagnoses: Cervical radiculopathy   Discharge Diagnoses:  Principal Problem:   S/P cervical spinal fusion   Discharged Condition: good  Hospital Course:  [name] is a 66 y.o presenting with cervical radiculopathy s/o C5-7 ACDF. Her intraoperative course was uncomplicated and she was admitted overnight for observation. She experienced some mild hand weakness post-op which has improved some overnight. She reports resolution of her pre-op arm pain. She tolerated a diet and was ambulating independently. She was discharged home on POD1 with medications for pain and stool softener.   Consults: None  Significant Diagnostic Studies: none  Treatments: surgery: as above. Please see separately dictated operative report for further details   Discharge Exam: Blood pressure (!) 154/93, pulse 74, temperature 97.9 F (36.6 C), resp. rate 17, height 5\' 5"  (1.651 m), weight 73 kg, SpO2 99 %. AA Ox3 CNI   Strength:5/5 throughout BUE except 4/5 right HF WF and WE Incision c/d/I with dermabond in place  Disposition:   Discharge Instructions     Incentive spirometry RT   Complete by: As directed       Allergies as of 05/13/2022   No Known Allergies      Medication List     STOP taking these medications    VITAMIN D (CHOLECALCIFEROL) PO       TAKE these medications    albuterol 108 (90 Base) MCG/ACT inhaler Commonly known as: VENTOLIN HFA Inhale 1-2 puffs into the lungs every 6 (six) hours as needed for wheezing or shortness of breath.   amLODipine 2.5 MG tablet Commonly known as: NORVASC Take 1 tablet (2.5 mg total) by mouth daily. What changed: when to take this   butalbital-acetaminophen-caffeine 50-325-40 MG tablet Commonly known as: FIORICET Take 1-2 tablets by mouth every 6 (six) hours as needed for  headache.   CALCIUM CITRATE PO Take 600 mg by mouth daily.   cyclobenzaprine 10 MG tablet Commonly known as: FLEXERIL Take 1 tablet (10 mg total) by mouth 3 (three) times daily as needed for muscle spasms. What changed:  medication strength how much to take when to take this   famotidine 20 MG tablet Commonly known as: PEPCID Take 20 mg by mouth at bedtime.   levothyroxine 175 MCG tablet Commonly known as: SYNTHROID Take 1 tablet (175 mcg total) by mouth daily before breakfast. 30 min before food   MELATONIN PO Take 1 tablet by mouth at bedtime.   ONE-A-DAY WOMENS FORMULA PO Take 1 capsule by mouth daily.   oxyCODONE 5 MG immediate release tablet Commonly known as: Oxy IR/ROXICODONE Take 1 tablet (5 mg total) by mouth every 4 (four) hours as needed for up to 5 days for severe pain or moderate pain.   polyethylene glycol powder 17 GM/SCOOP powder Commonly known as: GLYCOLAX/MIRALAX Take 34 g by mouth as needed.   PROBIOTIC DAILY PO Take 1 tablet by mouth daily at 6 (six) AM.   senna 8.6 MG Tabs tablet Commonly known as: SENOKOT Take 1 tablet (8.6 mg total) by mouth 2 (two) times daily as needed for mild constipation.   Trulance 3 MG Tabs Generic drug: Plecanatide Take 3 mg by mouth daily. Take IF the double dose of Miralax twice a day is not effective What changed:  when to take this reasons to take this   TURMERIC-GINGER PO Take 1 each by mouth daily.  VITAMIN C PO Take 1,000 mg by mouth as needed.   VITAMIN E PO Take 1 tablet by mouth daily at 6 (six) AM.        Follow-up Information     Susanne Borders, PA Follow up in 2 week(s).   Specialty: Neurosurgery Contact information: [address] [place] [address] [phone]                 Signed: Susanne Borders 05/13/2022, 8:52 AM

## 2022-05-13 NOTE — Plan of Care (Signed)
  Problem: Education: Goal: Knowledge of General Education information will improve Description: Including pain rating scale, medication(s)/side effects and non-pharmacologic comfort measures Outcome: Progressing   Problem: Health Behavior/Discharge Planning: Goal: Ability to manage health-related needs will improve Outcome: Progressing   Problem: Elimination: Goal: Will not experience complications related to bowel motility Outcome: Progressing   Problem: Clinical Measurements: Goal: Ability to maintain clinical measurements within normal limits will improve Outcome: Completed/Met Goal: Will remain free from infection Outcome: Completed/Met Goal: Diagnostic test results will improve Outcome: Completed/Met Goal: Respiratory complications will improve Outcome: Completed/Met Goal: Cardiovascular complication will be avoided Outcome: Completed/Met   Problem: Activity: Goal: Risk for activity intolerance will decrease Outcome: Completed/Met   Problem: Nutrition: Goal: Adequate nutrition will be maintained Outcome: Completed/Met   Problem: Coping: Goal: Level of anxiety will decrease Outcome: Completed/Met   Problem: Elimination: Goal: Will not experience complications related to urinary retention Outcome: Completed/Met   Problem: Pain Managment: Goal: General experience of comfort will improve Outcome: Completed/Met   Problem: Safety: Goal: Ability to remain free from injury will improve Outcome: Completed/Met   Problem: Skin Integrity: Goal: Risk for impaired skin integrity will decrease Outcome: Completed/Met

## 2022-05-14 ENCOUNTER — Telehealth: Payer: Self-pay

## 2022-05-14 DIAGNOSIS — M5412 Radiculopathy, cervical region: Secondary | ICD-10-CM

## 2022-05-14 DIAGNOSIS — Z981 Arthrodesis status: Secondary | ICD-10-CM

## 2022-05-14 NOTE — Telephone Encounter (Signed)
-----   Message from Rockey Situ sent at 05/14/2022  8:22 AM EDT ----- Regarding: post op issues Contact: [phone]  C5-7 ACDF on 05/12/22 Patient has no strengthen in her right arm/hand. She lives alone. Can the office get her some assistance?

## 2022-05-14 NOTE — Telephone Encounter (Signed)
Referral has been faxed to [facility].

## 2022-05-14 NOTE — Telephone Encounter (Signed)
Dr Myer Haff said we can refer her for outpatient PT. I spoke with the patient and she is agreeable to this and requests a referral to [facility] in [place]. Referral has been placed. I informed her to call us back next week if she hasn't heard from them.

## 2022-05-15 ENCOUNTER — Ambulatory Visit (INDEPENDENT_AMBULATORY_CARE_PROVIDER_SITE_OTHER): Payer: Medicare Other | Admitting: Neurosurgery

## 2022-05-15 ENCOUNTER — Telehealth: Payer: Self-pay

## 2022-05-15 VITALS — Ht 65.0 in

## 2022-05-15 DIAGNOSIS — Z09 Encounter for follow-up examination after completed treatment for conditions other than malignant neoplasm: Secondary | ICD-10-CM

## 2022-05-15 DIAGNOSIS — Z981 Arthrodesis status: Secondary | ICD-10-CM

## 2022-05-15 DIAGNOSIS — M5412 Radiculopathy, cervical region: Secondary | ICD-10-CM

## 2022-05-15 DIAGNOSIS — M4802 Spinal stenosis, cervical region: Secondary | ICD-10-CM

## 2022-05-15 MED ORDER — METHYLPREDNISOLONE 4 MG PO TBPK: ORAL_TABLET | ORAL | 0 refills | Status: DC

## 2022-05-15 NOTE — Telephone Encounter (Signed)
-----   Message from Rockey Situ sent at 05/15/2022 10:00 AM EDT ----- Regarding: postop question Contact: [phone]  C5-7 ACDF on 05/12/22 Will she get strength back in her right hand/arm? She states the provider never went over with her what her expectations were after surgery. She is constantly dropping things. She is worried she will never get her strength back.  Can someone call her back.

## 2022-05-15 NOTE — Progress Notes (Signed)
   REFERRING PHYSICIAN:  Mclean-scocuzza, Pasty Spillers, Md [address] [place],  [place] [address]  DOS: C5-7 ACDF 05/12/22  HISTORY OF PRESENT ILLNESS: [name] is about 3 days status post ACDF.  Postoperatively she experienced some weakness in her right hand and overall heaviness.  She has called the office about this as she is concerned and states that she has continued to have issues with holding objects and pain in between her shoulder blades making it difficult for her to raise her right arm above her head.  She also endorses some hoarseness in her voice and mild difficulty swallowing but tolerating a largely normal diet.  She continues to take oxycodone and Robaxin as needed.  PHYSICAL EXAMINATION:  NEUROLOGICAL:  General: In no acute distress.   Awake, alert, oriented to person, place, and time.  Pupils equal round and reactive to light.  Facial tone is symmetric.  Tongue protrusion is midline.  There is no pronator drift.  Strength: Side Biceps Triceps Deltoid Interossei Grip Wrist Ext. Wrist Flex.  R 5 5 5  4- 4 5 5   L 5 5 5 5 5 5 5    Incision c/d/I with dermabond in place  Imaging:  No recent imaging to review  Assessment / Plan: [name] is doing fair after C5-7 ACDF.  While she has some mild grip strength weakness on the right this is no worse than it was in the hospital.  I provided her with reassurance that respiratory of her symptoms are not abnormal at this point and should improve with time.  We did discuss physical therapy but we will readdress this at her 2-week postop visit.  I have given her a prescription for Medrol Dosepak and encouraged her to continue with the medications that she was given at discharge.  I also encouraged her to use ice or heat as long as she does not place them directly on her incision.  She is to call should she have any additional questions or concerns.  She expressed understanding was in agreement with this plan.  PA-C Dept  of Neurosurgery

## 2022-05-15 NOTE — Telephone Encounter (Signed)
Patient is scheduled to come in today at 3:00pm. Patient is aware.

## 2022-05-20 ENCOUNTER — Telehealth: Payer: Self-pay

## 2022-05-20 DIAGNOSIS — Z981 Arthrodesis status: Secondary | ICD-10-CM

## 2022-05-20 DIAGNOSIS — M5412 Radiculopathy, cervical region: Secondary | ICD-10-CM

## 2022-05-20 NOTE — Telephone Encounter (Signed)
-----   Message from Rockey Situ sent at 05/20/2022 11:11 AM EDT ----- Regarding: postop Contact: [phone]  C5-7 ACDF on 05/12/22  Patient is having increased pain in the back of her neck. She thought by this time the pain would be less.  Last night she applied BenGay to the back of her neck to see if that would help. She still does not have full movement in her right arm. Her PT appt with [facility] is not until 06/11/2022. Is this normal? Is here anything that you can recommend?

## 2022-05-21 ENCOUNTER — Other Ambulatory Visit: Payer: Self-pay

## 2022-05-21 ENCOUNTER — Telehealth: Payer: Self-pay

## 2022-05-21 DIAGNOSIS — Z981 Arthrodesis status: Secondary | ICD-10-CM

## 2022-05-21 MED ORDER — CELECOXIB 200 MG PO CAPS: 200.0000 mg | ORAL_CAPSULE | Freq: Two times a day (BID) | ORAL | 0 refills | Status: AC

## 2022-05-21 NOTE — Telephone Encounter (Signed)
Rx sent for celebrex 200mg  BID. Pt notified and advised not to start until she finishes the MDP - she states she has 1 day left but she doesn't know if it has helped. Appt made for tomorrow. Xray orders placed. Pt notified to arrive 45-60 minutes early for xrays at [facility] location.

## 2022-05-21 NOTE — Telephone Encounter (Signed)
Rx sent for celebrex 200mg  BID. Pt notified and advised not to start until she finishes the MDP - she states she has 1 day left but she doesn't know if it has helped.

## 2022-05-21 NOTE — Telephone Encounter (Signed)
-----   Message from Rockey Situ sent at 05/21/2022 11:25 AM EDT ----- Regarding: postpo pain Contact: [phone] post op C5-7 ACDF on 05/12/22 Patient is calling the office. She is having severe pain in her neck down to her finger tips on the right side. She is still dropping things from her right hand. She lives alone and is having a hard time caring for herself. She is requesting to be seen sooner than 9/5, she knows something is seriously wrong.

## 2022-05-22 ENCOUNTER — Ambulatory Visit (INDEPENDENT_AMBULATORY_CARE_PROVIDER_SITE_OTHER): Payer: Medicare Other | Admitting: Neurosurgery

## 2022-05-22 ENCOUNTER — Encounter: Payer: Self-pay | Admitting: Neurosurgery

## 2022-05-22 ENCOUNTER — Ambulatory Visit
Admission: RE | Admit: 2022-05-22 | Discharge: 2022-05-22 | Disposition: A | Discharge status: Home / Self Care | Payer: Medicare Other | Attending: Neurosurgery | Admitting: Neurosurgery

## 2022-05-22 ENCOUNTER — Ambulatory Visit
Admission: RE | Admit: 2022-05-22 | Discharge: 2022-05-22 | Disposition: A | Discharge status: Home / Self Care | Payer: Medicare Other | Source: Ambulatory Visit | Attending: Neurosurgery | Admitting: Neurosurgery

## 2022-05-22 VITALS — BP 122/82 | Temp 97.9°F | Ht 65.0 in | Wt 161.0 lb

## 2022-05-22 DIAGNOSIS — M5412 Radiculopathy, cervical region: Secondary | ICD-10-CM

## 2022-05-22 DIAGNOSIS — Z981 Arthrodesis status: Secondary | ICD-10-CM

## 2022-05-22 DIAGNOSIS — Z09 Encounter for follow-up examination after completed treatment for conditions other than malignant neoplasm: Secondary | ICD-10-CM

## 2022-05-22 DIAGNOSIS — M542 Cervicalgia: Secondary | ICD-10-CM | POA: Diagnosis not present

## 2022-05-22 DIAGNOSIS — M4802 Spinal stenosis, cervical region: Secondary | ICD-10-CM

## 2022-05-22 NOTE — Progress Notes (Signed)
   REFERRING PHYSICIAN:  Mclean-scocuzza, Jasmine Spillers, Md [address] [place],  [place] [address]  DOS: C5-7 ACDF 05/12/22  HISTORY OF PRESENT ILLNESS: [name] is status post ACDF.  She has had some weakness in her right hand, though it is improving .  She notes that she has dropped her coffee.  She has some trouble with pain in her right shoulder when she lifts her arms above her shoulder.     PHYSICAL EXAMINATION:  NEUROLOGICAL:  General: In no acute distress.   Awake, alert, oriented to person, place, and time.  Pupils equal round and reactive to light.  Facial tone is symmetric.  Tongue protrusion is midline.  There is no pronator drift.  Strength: Side Biceps Triceps Deltoid Interossei Grip Wrist Ext. Wrist Flex.  R 5 4+ 5 4+ 4+ 5 5  L 5 5 5 5 5 5 5    Incision c/d/I with dermabond in place  Imaging:  No complications noted  Assessment / Plan: [name] is improving after C5-7 ACDF.  He is quite concerned about her right hand weakness, but it is substantially improved compared to last week.  She is going to start physical therapy soon.  She will call [facility] if she can find another place which can get her in sooner.  I will be happy to send a new referral to a different physical therapist to start working on her right arm strength.  I am somewhat concerned she may have right shoulder pathology, but we will start physical therapy for that first.  We will see her back as regularly scheduled.  I did discuss with her that walking for exercise is fine.  She can also remove her Dermabond whenever she would like with nail polish remover.   [place] MD Dept of Neurosurgery

## 2022-05-23 NOTE — Addendum Note (Signed)
Addended by: Sharlot Gowda on: 05/23/2022 09:41 AM   Modules accepted: Orders

## 2022-05-23 NOTE — Progress Notes (Unsigned)
   REFERRING PHYSICIAN:  Venetia Night, Md [address] [place],  [place] [address]  DOS: 05/12/22 ACDF C5-C7  HISTORY OF PRESENT ILLNESS: [name] is 2 weeks status post ACDF C5-C7.   She mad some mild right hand weakness postop which was improving.      PHYSICAL EXAMINATION:  NEUROLOGICAL:  General: In no acute distress.   Awake, alert, oriented to person, place, and time.  Pupils equal round and reactive to light.  Facial tone is symmetric.  Tongue protrusion is midline.  There is no pronator drift.  Strength: Side Biceps Triceps Deltoid Interossei Grip Wrist Ext. Wrist Flex.  R 5 5 5 5 5 5 5   L 5 5 5 5 5 5 5    Incision c/d/I  Imaging:  Nothing new to review.   Assessment / Plan: [name] is doing well after ***. Treatment options discussed with patient and following plan made:    - We discussed activity escalation and I have advised the patient to lift up to 10 pounds until 6 weeks after surgery (until her follow up).  - Would care reviewed.  - Continue on current medications including *** - Keep follow up as scheduled with Dr. in 4 weeks. Will need xrays prior to this visit.   Advised to contact the office if any questions or concerns arise.   [facility] PA-C Dept of Neurosurgery

## 2022-05-23 NOTE — Telephone Encounter (Signed)
Referral has been sent to [facility].

## 2022-05-26 ENCOUNTER — Emergency Department
Admission: EM | Admit: 2022-05-26 | Discharge: 2022-05-27 | Disposition: A | Discharge status: Home / Self Care | Payer: Medicare Other | Attending: Emergency Medicine | Admitting: Emergency Medicine

## 2022-05-26 ENCOUNTER — Other Ambulatory Visit: Payer: Self-pay

## 2022-05-26 DIAGNOSIS — R110 Nausea: Secondary | ICD-10-CM | POA: Insufficient documentation

## 2022-05-26 DIAGNOSIS — I10 Essential (primary) hypertension: Secondary | ICD-10-CM | POA: Insufficient documentation

## 2022-05-26 DIAGNOSIS — I639 Cerebral infarction, unspecified: Secondary | ICD-10-CM | POA: Diagnosis not present

## 2022-05-26 DIAGNOSIS — M542 Cervicalgia: Secondary | ICD-10-CM | POA: Insufficient documentation

## 2022-05-26 DIAGNOSIS — R42 Dizziness and giddiness: Secondary | ICD-10-CM | POA: Diagnosis not present

## 2022-05-26 DIAGNOSIS — R531 Weakness: Secondary | ICD-10-CM | POA: Diagnosis not present

## 2022-05-26 DIAGNOSIS — I6782 Cerebral ischemia: Secondary | ICD-10-CM | POA: Diagnosis not present

## 2022-05-26 NOTE — ED Triage Notes (Signed)
Pt presents to ER with c/o dizziness that started around 2200 tonight.  Pt states she checked her BP when she started feeling dizzy and it was around 155/115.  Pt endorses feeling off since having spinal fusion surgery on 8/21.  Pt states dizziness is constant and does not change regardless of position.  Pt is A&O x4 at this time in NAD and ambulatory to room.

## 2022-05-27 ENCOUNTER — Emergency Department: Payer: Medicare Other

## 2022-05-27 ENCOUNTER — Encounter: Payer: Self-pay | Admitting: Orthopedic Surgery

## 2022-05-27 ENCOUNTER — Ambulatory Visit (INDEPENDENT_AMBULATORY_CARE_PROVIDER_SITE_OTHER): Payer: Medicare Other | Admitting: Orthopedic Surgery

## 2022-05-27 VITALS — BP 130/78 | Temp 98.5°F | Ht 65.0 in | Wt 161.0 lb

## 2022-05-27 DIAGNOSIS — Z981 Arthrodesis status: Secondary | ICD-10-CM

## 2022-05-27 DIAGNOSIS — I6782 Cerebral ischemia: Secondary | ICD-10-CM | POA: Diagnosis not present

## 2022-05-27 DIAGNOSIS — I639 Cerebral infarction, unspecified: Secondary | ICD-10-CM | POA: Diagnosis not present

## 2022-05-27 DIAGNOSIS — M5412 Radiculopathy, cervical region: Secondary | ICD-10-CM

## 2022-05-27 DIAGNOSIS — Z09 Encounter for follow-up examination after completed treatment for conditions other than malignant neoplasm: Secondary | ICD-10-CM

## 2022-05-27 LAB — CBC WITH DIFFERENTIAL/PLATELET
Abs Immature Granulocytes: 0.01 10*3/uL (ref 0.00–0.07)
Basophils Absolute: 0 10*3/uL (ref 0.0–0.1)
Basophils Relative: 1 %
Eosinophils Absolute: 0.2 10*3/uL (ref 0.0–0.5)
Eosinophils Relative: 3 %
HCT: 35.8 % — ABNORMAL LOW (ref 36.0–46.0)
Hemoglobin: 11.7 g/dL — ABNORMAL LOW (ref 12.0–15.0)
Immature Granulocytes: 0 %
Lymphocytes Relative: 33 %
Lymphs Abs: 1.9 10*3/uL (ref 0.7–4.0)
MCH: 27.9 pg (ref 26.0–34.0)
MCHC: 32.7 g/dL (ref 30.0–36.0)
MCV: 85.4 fL (ref 80.0–100.0)
Monocytes Absolute: 0.6 10*3/uL (ref 0.1–1.0)
Monocytes Relative: 10 %
Neutro Abs: 3.1 10*3/uL (ref 1.7–7.7)
Neutrophils Relative %: 53 %
Platelets: 279 10*3/uL (ref 150–400)
RBC: 4.19 MIL/uL (ref 3.87–5.11)
RDW: 12.6 % (ref 11.5–15.5)
WBC: 5.8 10*3/uL (ref 4.0–10.5)
nRBC: 0 % (ref 0.0–0.2)

## 2022-05-27 LAB — BASIC METABOLIC PANEL
Anion gap: 7 (ref 5–15)
BUN: 28 mg/dL — ABNORMAL HIGH (ref 8–23)
CO2: 24 mmol/L (ref 22–32)
Calcium: 9.5 mg/dL (ref 8.9–10.3)
Chloride: 109 mmol/L (ref 98–111)
Creatinine, Ser: 0.72 mg/dL (ref 0.44–1.00)
GFR, Estimated: >60 mL/min (ref >60)
Glucose, Bld: 109 mg/dL — ABNORMAL HIGH (ref 70–99)
Potassium: 3.9 mmol/L (ref 3.5–5.1)
Sodium: 140 mmol/L (ref 135–145)

## 2022-05-27 MED ORDER — LACTATED RINGERS IV BOLUS
1000.0000 mL | Freq: Once | INTRAVENOUS | Status: AC
  Administered 2022-05-27: 1000 mL via INTRAVENOUS

## 2022-05-27 MED ORDER — IOHEXOL 350 MG/ML SOLN
75.0000 mL | Freq: Once | INTRAVENOUS | Status: AC | PRN
  Administered 2022-05-27: 75 mL via INTRAVENOUS

## 2022-05-27 NOTE — Discharge Instructions (Signed)
Your workup in the Emergency Department today was reassuring.  We did not find any specific abnormalities.  We recommend you drink plenty of fluids, take your regular medications and/or any new ones prescribed today, and follow up with the doctor(s) listed in these documents as recommended.  Return to the Emergency Department if you develop new or worsening symptoms that concern you.  

## 2022-05-27 NOTE — ED Provider Notes (Signed)
[facility] Provider Note    Event Date/Time   First MD Initiated Contact with Patient 05/26/22 2340     (approximate)   History   Dizziness   HPI  [name] is a 66 y.o. female who presents for evaluation of a constellation of symptoms including dizziness, difficulty with ambulation, and some neck discomfort as well as high blood pressure.  She had anterior approach C5-7 spinal fusion by Dr. Myer Haff on 05/12/2022.  She says she has been doing okay, but as of today she developed lightheadedness and dizziness where she felt like she might pass out or fall down.  She says she felt like her balance was a little bit off.  Her neck hurts a little bit but not anymore that has since the surgery.  She has not noticed any swelling.  Her wound appears healthy.  She is had some nausea but no vomiting.  She has been having some right arm weakness but that has been going on for a long time and is no worse today.  She is having no weakness or numbness or tingling in her legs.  No recent fevers.  No confusion.  She said that she lives by herself at home and was afraid she might die in her sleep so she thought she should get checked out.     Physical Exam   Triage Vital Signs: ED Triage Vitals  Enc Vitals Group     BP 05/26/22 2332 (!) 152/118     Pulse Rate 05/26/22 2332 94     Resp 05/26/22 2332 14     Temp 05/26/22 2332 98.4 F (36.9 C)     Temp Source 05/26/22 2332 Oral     SpO2 05/26/22 2332 99 %     Weight --      Height --      Head Circumference --      Peak Flow --      Pain Score 05/26/22 2331 8     Pain Loc --      Pain Edu? --      Excl. in GC? --     Most recent vital signs: Vitals:   05/27/22 0015 05/27/22 0230  BP:  (!) 136/99  Pulse: 91 83  Resp: 18 15  Temp:  97.7 F (36.5 C)  SpO2: 99% 100%     General: Awake, no distress.  Awake, alert, appears relatively comfortable. CV:  Good peripheral perfusion.  Resp:  Normal effort.   Abd:  No distention.  Other:  Patient has slightly decreased strength on her right arm but she says that that is baseline.  Good grip strength in the left hand with slightly decreased on the right.  No dysmetria on finger-to-nose testing.  No nystagmus, extraocular movement is normal and intact.  No dysarthria, no aphasia.   ED Results / Procedures / Treatments   Labs (all labs ordered are listed, but only abnormal results are displayed) Labs Reviewed  CBC WITH DIFFERENTIAL/PLATELET - Abnormal; Notable for the following components:      Result Value   Hemoglobin 11.7 (*)    HCT 35.8 (*)    All other components within normal limits  BASIC METABOLIC PANEL - Abnormal; Notable for the following components:   Glucose, Bld 109 (*)    BUN 28 (*)    All other components within normal limits     EKG  ED ECG REPORT I, Loleta Rose, the attending physician, personally viewed and  interpreted this ECG.  Date: 05/26/2022 EKG Time: 23: 34 Rate: 95 Rhythm: normal sinus rhythm QRS Axis: normal Intervals: normal ST/T Wave abnormalities: normal Narrative Interpretation: no evidence of acute ischemia    RADIOLOGY See hospital course for details: MR brain and CTA head and neck showed no acute abnormalities    PROCEDURES:  Critical Care performed: No  Procedures   MEDICATIONS ORDERED IN ED: Medications  lactated ringers bolus 1,000 mL (1,000 mLs Intravenous New Bag/Given 05/27/22 0230)  iohexol (OMNIPAQUE) 350 MG/ML injection 75 mL (75 mLs Intravenous Contrast Given 05/27/22 0214)     IMPRESSION / MDM / ASSESSMENT AND PLAN / ED COURSE  I reviewed the triage vital signs and the nursing notes.                              Differential diagnosis includes, but is not limited to, volume depletion, CVA, posterior circulation impairments, carotid dissection.  Patient's presentation is most consistent with acute presentation with potential threat to life or bodily  function.  Initially the patient was hypertensive but now her blood pressure has resolved.  Her vitals are otherwise reassuring.  Her physical exam including her neurological exam is also reassuring, but the symptoms she is describing are somewhat worrisome.  She just had a major and invasive surgery to her neck and now, about a week and a half later, she is reporting dizziness, ataxia, with some pain.  I do not believe that she is having an acute bleed, but given the recent manipulation of her neck, and the possibility of CVA and/or vascular injury, I am ordering CTA head/neck as well as MR brain.  Patient agrees with the plan.  Labs ordered include CBC with differential and basic metabolic panel.  The results are within normal limits.  Given the possibility that volume depletion could be adding to her symptoms, I ordered LR 1 L IV bolus.  The patient is on the cardiac monitor to evaluate for evidence of arrhythmia and/or significant heart rate changes.   Clinical Course as of 05/27/22 0327  Tue May 27, 2022  0302 MR BRAIN WO CONTRAST I viewed and interpreted the patient's MR brain.  I see no obvious evidence of CVA.  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 [CF]  0326 CT ANGIO HEAD NECK W WO CM I viewed and interpreted the patient's CTA head and neck.  I see no evidence of obstructive process or vascular injury.  Radiology report agrees that the CTA head and neck are reassuring without acute abnormalities.  I updated the patient and she says she continues to feel asymptomatic at this time.  She is reassured by the findings and is comfortable with the plan to go home.  I gave my usual and customary return precautions and follow-up recommendations. [CF]    Clinical Course User Index [CF] Loleta Rose, MD     FINAL CLINICAL IMPRESSION(S) / ED DIAGNOSES   Final diagnoses:  Dizziness     Rx / DC Orders   ED Discharge Orders      None        Note:  This document was prepared using Dragon voice recognition software and may include unintentional dictation errors.   Loleta Rose, MD 05/27/22 [phone]

## 2022-05-29 DIAGNOSIS — M542 Cervicalgia: Secondary | ICD-10-CM | POA: Diagnosis not present

## 2022-05-29 DIAGNOSIS — M79601 Pain in right arm: Secondary | ICD-10-CM | POA: Diagnosis not present

## 2022-06-02 DIAGNOSIS — M542 Cervicalgia: Secondary | ICD-10-CM | POA: Diagnosis not present

## 2022-06-02 DIAGNOSIS — M79601 Pain in right arm: Secondary | ICD-10-CM | POA: Diagnosis not present

## 2022-06-02 DIAGNOSIS — M546 Pain in thoracic spine: Secondary | ICD-10-CM | POA: Diagnosis not present

## 2022-06-04 DIAGNOSIS — M542 Cervicalgia: Secondary | ICD-10-CM | POA: Diagnosis not present

## 2022-06-04 DIAGNOSIS — M79601 Pain in right arm: Secondary | ICD-10-CM | POA: Diagnosis not present

## 2022-06-04 DIAGNOSIS — M546 Pain in thoracic spine: Secondary | ICD-10-CM | POA: Diagnosis not present

## 2022-06-05 ENCOUNTER — Ambulatory Visit (INDEPENDENT_AMBULATORY_CARE_PROVIDER_SITE_OTHER): Payer: Medicare Other | Admitting: Internal Medicine

## 2022-06-05 ENCOUNTER — Encounter: Payer: Self-pay | Admitting: Internal Medicine

## 2022-06-05 ENCOUNTER — Other Ambulatory Visit: Payer: Self-pay | Admitting: Neurosurgery

## 2022-06-05 VITALS — BP 122/70 | HR 84 | Temp 98.5°F | Ht 65.0 in | Wt 165.6 lb

## 2022-06-05 DIAGNOSIS — I6381 Other cerebral infarction due to occlusion or stenosis of small artery: Secondary | ICD-10-CM | POA: Insufficient documentation

## 2022-06-05 DIAGNOSIS — Z1389 Encounter for screening for other disorder: Secondary | ICD-10-CM

## 2022-06-05 DIAGNOSIS — Z8673 Personal history of transient ischemic attack (TIA), and cerebral infarction without residual deficits: Secondary | ICD-10-CM

## 2022-06-05 DIAGNOSIS — I10 Essential (primary) hypertension: Secondary | ICD-10-CM

## 2022-06-05 DIAGNOSIS — Z981 Arthrodesis status: Secondary | ICD-10-CM

## 2022-06-05 DIAGNOSIS — Z23 Encounter for immunization: Secondary | ICD-10-CM | POA: Diagnosis not present

## 2022-06-05 DIAGNOSIS — J383 Other diseases of vocal cords: Secondary | ICD-10-CM
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
## 2022-06-05 NOTE — Patient Instructions (Addendum)
[facility]   [address]   [place], [place] [address]   [phone]   Jolene Provost, MD   [phone] [facility] [address]   [facility]   [place], [place] [address]   [phone] (Work)   [phone] (Fax)    Dr. Sherryll Burger   ENT  Phone Fax E-mail Address  [phone] Not available [address]   [address]   [place] [address]     Specialties     Otolaryngology            Cardiology -End/Arida MD No Physician   Primary Contact Information  Phone Fax E-mail Address  [phone] Not available [address]   [place] [address]     Specialties     Cardiology

## 2022-06-09 DIAGNOSIS — M546 Pain in thoracic spine: Secondary | ICD-10-CM | POA: Diagnosis not present

## 2022-06-09 DIAGNOSIS — M542 Cervicalgia: Secondary | ICD-10-CM | POA: Diagnosis not present

## 2022-06-09 DIAGNOSIS — M79601 Pain in right arm: Secondary | ICD-10-CM | POA: Diagnosis not present

## 2022-06-10 ENCOUNTER — Encounter: Payer: Self-pay | Admitting: Internal Medicine

## 2022-06-12 DIAGNOSIS — R42 Dizziness and giddiness: Secondary | ICD-10-CM | POA: Diagnosis not present

## 2022-06-12 DIAGNOSIS — Z8673 Personal history of transient ischemic attack (TIA), and cerebral infarction without residual deficits: Secondary | ICD-10-CM | POA: Diagnosis not present

## 2022-06-12 DIAGNOSIS — R29898 Other symptoms and signs involving the musculoskeletal system: Secondary | ICD-10-CM | POA: Diagnosis not present

## 2022-06-12 DIAGNOSIS — G479 Sleep disorder, unspecified: Secondary | ICD-10-CM | POA: Diagnosis not present
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
## 2022-06-13 ENCOUNTER — Ambulatory Visit: Payer: Medicare Other | Attending: Internal Medicine | Admitting: Internal Medicine

## 2022-06-13 ENCOUNTER — Ambulatory Visit (INDEPENDENT_AMBULATORY_CARE_PROVIDER_SITE_OTHER): Payer: Medicare Other

## 2022-06-13 ENCOUNTER — Encounter: Payer: Self-pay | Admitting: Internal Medicine

## 2022-06-13 VITALS — BP 137/85 | HR 84 | Ht 65.0 in | Wt 164.2 lb

## 2022-06-13 DIAGNOSIS — R002 Palpitations: Secondary | ICD-10-CM

## 2022-06-13 DIAGNOSIS — I6381 Other cerebral infarction due to occlusion or stenosis of small artery: Secondary | ICD-10-CM | POA: Diagnosis not present

## 2022-06-13 DIAGNOSIS — Z8673 Personal history of transient ischemic attack (TIA), and cerebral infarction without residual deficits: Secondary | ICD-10-CM | POA: Diagnosis not present

## 2022-06-13 DIAGNOSIS — I10 Essential (primary) hypertension: Secondary | ICD-10-CM

## 2022-06-13 DIAGNOSIS — I639 Cerebral infarction, unspecified: Secondary | ICD-10-CM

## 2022-06-13 DIAGNOSIS — E039 Hypothyroidism, unspecified: Secondary | ICD-10-CM | POA: Diagnosis not present

## 2022-06-13 NOTE — Patient Instructions (Addendum)
Medication Instructions:  Your physician recommends that you continue on your current medications as directed. Please refer to the Current Medication list given to you today.  *If you need a refill on your cardiac medications before your next appointment, please call your pharmacy*   Lab Work: None If you have labs (blood work) drawn today and your tests are completely normal, you will receive your results only by: [place] (if you have MyChart) OR A paper copy in the mail If you have any lab test that is abnormal or we need to change your treatment, we will call you to review the results.   Testing/Procedures: Your physician has requested that you have an echocardiogram/bubble study. Echocardiography is a painless test that uses sound waves to create images of your heart. It provides your doctor with information about the size and shape of your heart and how well your heart's chambers and valves are working. This procedure takes approximately one hour. There are no restrictions for this procedure.    Follow-Up: At [facility], you and your health needs are our priority.  As part of our continuing mission to provide you with exceptional heart care, we have created designated Provider Care Teams.  These Care Teams include your primary Cardiologist (physician) and Advanced Practice Providers (APPs -  Physician Assistants and Nurse Practitioners) who all work together to provide you with the care you need, when you need it.   Your next appointment:   3 month(s)  The format for your next appointment:   In Person  Provider:   You may see Nelva Bush, MD or one of the following Advanced Practice Providers on your designated Care Team:   Murray Hodgkins, NP Christell Faith, PA-C Cadence Kathlen Mody, PA-C Gerrie Nordmann, NP    [place] Instructions  Your physician has requested you wear a ZIO patch monitor for 14 days.  This is a single patch monitor. [employer]  supplies one patch monitor per enrollment. Additional stickers are not available. Please do not apply patch if you will be having a Nuclear Stress Test,  Echocardiogram, Cardiac CT, MRI, or Chest Xray during the period you would be wearing the  monitor. The patch cannot be worn during these tests. You cannot remove and re-apply the  ZIO XT patch monitor.  Your ZIO patch monitor will be mailed 3 day USPS to your address on file. It may take 3-5 days  to receive your monitor after you have been enrolled.  Once you have received your monitor, please review the enclosed instructions. Your monitor  has already been registered assigning a specific monitor serial # to you.  Billing and Patient Assistance Program Information  We have supplied [employer] with any of your insurance information on file for billing purposes. [employer] offers a sliding scale Patient Assistance Program for patients that do not have  insurance, or whose insurance does not completely cover the cost of the ZIO monitor.  You must apply for the Patient Assistance Program to qualify for this discounted rate.  To apply, please call [employer] at [phone], select option 4, select option 2, ask to apply for  Patient Assistance Program. Theodore Demark will ask your household income, and how many people  are in your household. They will quote your out-of-pocket cost based on that information.  [employer] will also be able to set up a 27-month, interest-free payment plan if needed.  Applying the monitor   Shave hair from upper left chest.  Hold abrader  disc by orange tab. Rub abrader in 40 strokes over the upper left chest as  indicated in your monitor instructions.  Clean area with 4 enclosed alcohol pads. Let dry.  Apply patch as indicated in monitor instructions. Patch will be placed under collarbone on left  side of chest with arrow pointing upward.  Rub patch adhesive wings for 2 minutes. Remove white label marked "1". Remove the white   label marked "2". Rub patch adhesive wings for 2 additional minutes.  While looking in a mirror, press and release button in center of patch. A small green light will  flash 3-4 times. This will be your only indicator that the monitor has been turned on.  Do not shower for the first 24 hours. You may shower after the first 24 hours.  Press the button if you feel a symptom. You will hear a small click. Record Date, Time and  Symptom in the Patient Logbook.  When you are ready to remove the patch, follow instructions on the last 2 pages of Patient  Logbook. Stick patch monitor onto the last page of Patient Logbook.  Place Patient Logbook in the blue and white box. Use locking tab on box and tape box closed  securely. The blue and white box has prepaid postage on it. Please place it in the mailbox as  soon as possible. Your physician should have your test results approximately 7 days after the  monitor has been mailed back to [facility].  Call [facility] Customer Care at [phone] if you have questions regarding  your ZIO XT patch monitor. Call them immediately if you see an orange light blinking on your  monitor.  If your monitor falls off in less than 4 days, contact our Monitor department at [phone].  If your monitor becomes loose or falls off after 4 days call [employer] at [phone] for  suggestions on securing your monitor   Important Information About Sugar

## 2022-06-16 ENCOUNTER — Encounter: Payer: Self-pay | Admitting: Internal Medicine

## 2022-06-16 ENCOUNTER — Other Ambulatory Visit: Payer: Self-pay | Admitting: Internal Medicine

## 2022-06-16 DIAGNOSIS — M546 Pain in thoracic spine: Secondary | ICD-10-CM | POA: Diagnosis not present

## 2022-06-16 DIAGNOSIS — M79601 Pain in right arm: Secondary | ICD-10-CM | POA: Diagnosis not present

## 2022-06-16 DIAGNOSIS — M542 Cervicalgia: Secondary | ICD-10-CM | POA: Diagnosis not present

## 2022-06-16 MED ORDER — AMLODIPINE BESYLATE 5 MG PO TABS
5.0000 mg | ORAL_TABLET | Freq: Every day | ORAL | 3 refills | Status: DC
Start: 2022-06-16 — End: 2023-06-09

## 2022-06-18 DIAGNOSIS — R490 Dysphonia: Secondary | ICD-10-CM | POA: Diagnosis not present

## 2022-06-19 ENCOUNTER — Other Ambulatory Visit: Payer: Self-pay | Admitting: Internal Medicine

## 2022-06-19 DIAGNOSIS — I639 Cerebral infarction, unspecified: Secondary | ICD-10-CM

## 2022-06-19 DIAGNOSIS — I10 Essential (primary) hypertension: Secondary | ICD-10-CM

## 2022-06-19 DIAGNOSIS — R002 Palpitations: Secondary | ICD-10-CM | POA: Diagnosis not present

## 2022-06-19 MED ORDER — TRIAMTERENE-HCTZ 37.5-25 MG PO TABS: 0.5000 | ORAL_TABLET | Freq: Every day | ORAL | 1 refills | Status: DC

## 2022-06-23 ENCOUNTER — Other Ambulatory Visit: Payer: Self-pay

## 2022-06-23 DIAGNOSIS — M542 Cervicalgia: Secondary | ICD-10-CM | POA: Diagnosis not present

## 2022-06-23 DIAGNOSIS — Z981 Arthrodesis status: Secondary | ICD-10-CM

## 2022-06-23 DIAGNOSIS — M546 Pain in thoracic spine: Secondary | ICD-10-CM | POA: Diagnosis not present

## 2022-06-23 DIAGNOSIS — M79601 Pain in right arm: Secondary | ICD-10-CM | POA: Diagnosis not present

## 2022-06-24 ENCOUNTER — Ambulatory Visit
Admission: RE | Admit: 2022-06-24 | Discharge: 2022-06-24 | Disposition: A | Discharge status: Home / Self Care | Payer: Medicare Other | Attending: Neurosurgery | Admitting: Neurosurgery

## 2022-06-24 ENCOUNTER — Ambulatory Visit
Admission: RE | Admit: 2022-06-24 | Discharge: 2022-06-24 | Disposition: A | Discharge status: Home / Self Care | Payer: Medicare Other | Source: Ambulatory Visit | Attending: Neurosurgery | Admitting: Neurosurgery

## 2022-06-24 ENCOUNTER — Ambulatory Visit (INDEPENDENT_AMBULATORY_CARE_PROVIDER_SITE_OTHER): Payer: Medicare Other | Admitting: Neurosurgery

## 2022-06-24 ENCOUNTER — Encounter: Payer: Self-pay | Admitting: Neurosurgery

## 2022-06-24 VITALS — BP 120/86 | HR 85 | Temp 97.9°F | Wt 159.0 lb

## 2022-06-24 DIAGNOSIS — Z981 Arthrodesis status: Secondary | ICD-10-CM

## 2022-06-24 DIAGNOSIS — M4802 Spinal stenosis, cervical region: Secondary | ICD-10-CM

## 2022-06-24 DIAGNOSIS — M542 Cervicalgia: Secondary | ICD-10-CM | POA: Diagnosis not present

## 2022-06-24 DIAGNOSIS — M5412 Radiculopathy, cervical region: Secondary | ICD-10-CM

## 2022-06-24 DIAGNOSIS — Z09 Encounter for follow-up examination after completed treatment for conditions other than malignant neoplasm: Secondary | ICD-10-CM

## 2022-06-24 MED ORDER — CYCLOBENZAPRINE HCL 10 MG PO TABS: 10.0000 mg | ORAL_TABLET | Freq: Three times a day (TID) | ORAL | 0 refills | Status: DC | PRN

## 2022-06-24 MED ORDER — CELECOXIB 200 MG PO CAPS: 200.0000 mg | ORAL_CAPSULE | Freq: Two times a day (BID) | ORAL | 2 refills | Status: AC

## 2022-06-24 NOTE — Progress Notes (Signed)
REFERRING PHYSICIAN:  Mclean-scocuzza, Nino Glow, Md [place],  [place] [id number]  DOS: 05/12/22 ACDF C5-C7  HISTORY OF PRESENT ILLNESS: [name] is status post ACDF C5-C7.   Has been working with physical therapy.  She is doing better.   PHYSICAL EXAMINATION:  NEUROLOGICAL:  General: In no acute distress.   Awake, alert, oriented to person, place, and time.  Pupils equal round and reactive to light.  Facial tone is symmetric.  Tongue protrusion is midline.  There is no pronator drift.  Strength: Side Biceps Triceps Deltoid Interossei Grip Wrist Ext. Wrist Flex.  R 5 5- 4+ 4+ 4+ 5 5  L 5 5 5 5 5 5 5    Incision c/d/I  Imaging:  CTA angio of head/neck 05/27/22:  FINDINGS: CT HEAD FINDINGS   Brain: Cerebral volume within normal limits. Small remote lacunar infarct at the posterior limb of the right internal capsule. No acute intracranial hemorrhage. No acute large vessel territory infarct. No mass lesion or midline shift. No hydrocephalus or extra-axial fluid collection.   Vascular: No abnormal hyperdense vessel.   Skull: Scalp soft tissues and calvarium within normal limits.   Sinuses/Orbits: Globes orbital soft tissues demonstrate no acute finding. Minimal mucosal thickening noted within the left sphenoid sinus. Paranasal sinuses mastoid air cells are otherwise clear.   Other: None.   Review of the MIP images confirms the above findings   CTA NECK FINDINGS   Aortic arch: Visualized aortic arch normal caliber with normal branch pattern. No stenosis about the origin the great vessels.   Right carotid system: Right common and internal carotid arteries patent without stenosis or dissection.   Left carotid system: Evaluation of the proximal left CCA somewhat limited by dense artifact from adjacent venous contamination. Visualized portions of the left common and internal carotid arteries patent without stenosis or dissection.   Vertebral arteries:  Both vertebral arteries arise from the subclavian arteries. No visible proximal subclavian artery stenosis. Left vertebral artery dominant. Evaluation of the proximal left vertebral artery limited by adjacent venous contamination. Visualized portions of the vertebral arteries patent without stenosis or dissection.   Skeleton: No discrete or worrisome osseous lesions. Prior ACDF at C4 through C7.   Other neck: No other acute soft tissue abnormality within the neck.   Upper chest: Visualized upper chest demonstrates no acute finding.   Review of the MIP images confirms the above findings   CTA HEAD FINDINGS   Anterior circulation: Both internal carotid arteries widely patent to the termini without stenosis. A1 segments widely patent. Normal anterior communicating artery complex. Both anterior cerebral arteries widely patent to their distal aspects without stenosis. No M1 stenosis or occlusion. Normal MCA bifurcations. Distal MCA branches well perfused and symmetric.   Posterior circulation: Both V4 segments are somewhat tortuous but widely patent to the vertebrobasilar junction. Both PICA patent. Basilar patent to its distal aspect without stenosis. Superior cerebral arteries patent bilaterally. Both PCA supplied via the basilar as well as prominent bilateral posterior communicating arteries. Both PCAs are well perfused and patent distal aspects.   Venous sinuses: Patent allowing for timing of the contrast bolus.   Anatomic variants: None significant.  No aneurysm.   Review of the MIP images confirms the above findings   IMPRESSION: 1. Negative CTA of the head and neck. No large vessel occlusion, hemodynamically significant stenosis, or other acute vascular abnormality. 2. No other acute intracranial abnormality. 3. Small remote lacunar infarct at the posterior limb of the  right internal capsule.     Electronically Signed   By: Rise Mu M.D.   On: 05/27/2022  03:02   MRI brain 05/27/22:  FINDINGS: Brain: Cerebral volume within normal limits. Few scattered foci of T2/FLAIR hyperintensity noted involving the supratentorial cerebral white matter, nonspecific, but most likely related to chronic microvascular ischemic disease, minimal in nature. Small remote lacunar infarct noted at the posterior limb of the right internal capsule.   No evidence for acute or subacute ischemia. Gray-white matter distinction maintained. No areas of chronic cortical infarction. No acute or chronic intracranial blood products.   No mass lesion, midline shift or mass effect. No hydrocephalus or extra-axial fluid collection. Pituitary gland suprasellar region normal.   Vascular: Major intracranial vascular flow voids are maintained.   Skull and upper cervical spine: Craniocervical junction normal. Bone marrow signal intensity within normal limits. No scalp soft tissue abnormality.   Sinuses/Orbits: Globes orbital soft tissues within normal limits. Paranasal sinuses and mastoid air cells are clear.   Other: None.   IMPRESSION: 1. No acute intracranial abnormality. 2. Small remote lacunar infarct at the posterior limb of the right internal capsule. 3. Underlying minimal chronic microvascular ischemic disease for age.     Electronically Signed   By: Rise Mu M.D.   On: 05/27/2022 02:44  Above imaging reviewed and I agree with findings.    Assessment / Plan: [name] continues to improve after the above surgery.   Has made significant progress.  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  It has been improving with exercises.  We reviewed her activity restrictions.  She will continue therapy and exercises and I will see her back in 6 weeks.  I am very optimistic that she will continue to improve.   Venetia Night MD  Dept of Neurosurgery

## 2022-06-27 DIAGNOSIS — M542 Cervicalgia: Secondary | ICD-10-CM | POA: Diagnosis not present

## 2022-06-27 DIAGNOSIS — M79601 Pain in right arm: Secondary | ICD-10-CM | POA: Diagnosis not present

## 2022-06-27 DIAGNOSIS — M546 Pain in thoracic spine: Secondary | ICD-10-CM | POA: Diagnosis not present

## 2022-06-30 ENCOUNTER — Encounter: Payer: Self-pay | Admitting: Family Medicine

## 2022-06-30 ENCOUNTER — Encounter: Payer: Self-pay | Admitting: Internal Medicine

## 2022-06-30 ENCOUNTER — Ambulatory Visit (INDEPENDENT_AMBULATORY_CARE_PROVIDER_SITE_OTHER): Payer: Medicare Other | Admitting: Family Medicine

## 2022-06-30 VITALS — BP 128/95 | HR 102 | Ht 65.0 in | Wt 164.5 lb

## 2022-06-30 DIAGNOSIS — N3941 Urge incontinence: Secondary | ICD-10-CM | POA: Diagnosis not present

## 2022-06-30 DIAGNOSIS — N393 Stress incontinence (female) (male): Secondary | ICD-10-CM | POA: Diagnosis not present

## 2022-06-30 MED ORDER — OXYBUTYNIN CHLORIDE ER 5 MG PO TB24: 5.0000 mg | ORAL_TABLET | Freq: Every day | ORAL | 2 refills | Status: DC

## 2022-06-30 NOTE — Assessment & Plan Note (Signed)
Begin Ditropan, usual side effects reviewed.

## 2022-06-30 NOTE — Assessment & Plan Note (Signed)
Offered and declined UroGyn referral. Begin Kegel's.

## 2022-06-30 NOTE — Progress Notes (Signed)
   Subjective:    Patient ID: [name] is a 66 y.o. female presenting with Gynecologic Exam  on 06/30/2022  HPI: Reports leaking urine for many years. It seems to have gotten worse. Has some mild urge and reports leaking with coughing, sneezing, laughing, jumping.  Review of Systems  Constitutional:  Negative for chills and fever.  Respiratory:  Negative for shortness of breath.   Cardiovascular:  Negative for chest pain.  Gastrointestinal:  Negative for abdominal pain, nausea and vomiting.  Genitourinary:  Negative for dysuria.  Skin:  Negative for rash.      Objective:    BP (!) 128/95   Pulse (!) 102   Ht 5\' 5"  (1.651 m)   Wt 164 lb 8 oz (74.6 kg)   BMI 27.37 kg/m  Physical Exam Exam conducted with a chaperone present.  Constitutional:      General: She is not in acute distress.    Appearance: She is well-developed.  HENT:     Head: Normocephalic and atraumatic.  Eyes:     General: No scleral icterus. Cardiovascular:     Rate and Rhythm: Normal rate.  Pulmonary:     Effort: Pulmonary effort is normal.  Abdominal:     Palpations: Abdomen is soft.  Musculoskeletal:     Cervical back: Neck supple.  Skin:    General: Skin is warm and dry.  Neurological:     Mental Status: She is alert and oriented to person, place, and time.         Assessment & Plan:   Problem List Items Addressed This Visit       Unprioritized   Urge incontinence - Primary    Begin Ditropan, usual side effects reviewed.      Relevant Medications   oxybutynin (DITROPAN XL) 5 MG 24 hr tablet   Stress incontinence    Offered and declined UroGyn referral. Begin Kegel's.      Relevant Medications   oxybutynin (DITROPAN XL) 5 MG 24 hr tablet     Return in about 3 months (around 09/30/2022) for Can go to [facility] office--lives in [place].  Donnamae Jude, MD 06/30/2022 2:04 PM

## 2022-07-04 DIAGNOSIS — I639 Cerebral infarction, unspecified: Secondary | ICD-10-CM | POA: Diagnosis not present

## 2022-07-04 DIAGNOSIS — R002 Palpitations: Secondary | ICD-10-CM | POA: Diagnosis not present

## 2022-07-08 ENCOUNTER — Other Ambulatory Visit: Payer: Self-pay | Admitting: Internal Medicine

## 2022-07-08 DIAGNOSIS — I6381 Other cerebral infarction due to occlusion or stenosis of small artery: Secondary | ICD-10-CM

## 2022-07-09 ENCOUNTER — Other Ambulatory Visit: Payer: Self-pay | Admitting: Internal Medicine

## 2022-07-09 DIAGNOSIS — M79601 Pain in right arm: Secondary | ICD-10-CM | POA: Diagnosis not present

## 2022-07-09 DIAGNOSIS — M546 Pain in thoracic spine: Secondary | ICD-10-CM | POA: Diagnosis not present

## 2022-07-09 DIAGNOSIS — M542 Cervicalgia: Secondary | ICD-10-CM | POA: Diagnosis not present

## 2022-07-16 DIAGNOSIS — M542 Cervicalgia: Secondary | ICD-10-CM | POA: Diagnosis not present

## 2022-07-16 DIAGNOSIS — M546 Pain in thoracic spine: Secondary | ICD-10-CM | POA: Diagnosis not present

## 2022-07-16 DIAGNOSIS — M79601 Pain in right arm: Secondary | ICD-10-CM | POA: Diagnosis not present

## 2022-07-17 ENCOUNTER — Ambulatory Visit: Payer: Medicare Other | Attending: Internal Medicine

## 2022-07-17 DIAGNOSIS — I6381 Other cerebral infarction due to occlusion or stenosis of small artery: Secondary | ICD-10-CM | POA: Diagnosis not present

## 2022-07-17 LAB — ECHOCARDIOGRAM COMPLETE BUBBLE STUDY
AR max vel: 2.6 cm2
AV Area VTI: 2.75 cm2
AV Area mean vel: 2.14 cm2
AV Mean grad: 3 mmHg
AV Peak grad: 7.1 mmHg
Ao pk vel: 1.33 m/s
Area-P 1/2: 2.53 cm2
Calc EF: 57 %
S' Lateral: 3 cm
Single Plane A2C EF: 59.4 %
Single Plane A4C EF: 53.2 %

## 2022-07-21 DIAGNOSIS — M79601 Pain in right arm: Secondary | ICD-10-CM | POA: Diagnosis not present

## 2022-07-21 DIAGNOSIS — M542 Cervicalgia: Secondary | ICD-10-CM | POA: Diagnosis not present

## 2022-07-21 DIAGNOSIS — M546 Pain in thoracic spine: Secondary | ICD-10-CM | POA: Diagnosis not present

## 2022-07-23 ENCOUNTER — Ambulatory Visit: Payer: Medicare Other | Admitting: Physician Assistant

## 2022-07-24 DIAGNOSIS — M542 Cervicalgia: Secondary | ICD-10-CM | POA: Diagnosis not present

## 2022-07-24 DIAGNOSIS — M79601 Pain in right arm: Secondary | ICD-10-CM | POA: Diagnosis not present

## 2022-07-24 DIAGNOSIS — M546 Pain in thoracic spine: Secondary | ICD-10-CM | POA: Diagnosis not present

## 2022-07-28 DIAGNOSIS — M79601 Pain in right arm: Secondary | ICD-10-CM | POA: Diagnosis not present

## 2022-07-28 DIAGNOSIS — M546 Pain in thoracic spine: Secondary | ICD-10-CM | POA: Diagnosis not present

## 2022-07-28 DIAGNOSIS — M542 Cervicalgia: Secondary | ICD-10-CM | POA: Diagnosis not present

## 2022-07-30 DIAGNOSIS — Z1231 Encounter for screening mammogram for malignant neoplasm of breast: Secondary | ICD-10-CM

## 2022-08-01 DIAGNOSIS — M546 Pain in thoracic spine: Secondary | ICD-10-CM | POA: Diagnosis not present

## 2022-08-01 DIAGNOSIS — M79601 Pain in right arm: Secondary | ICD-10-CM | POA: Diagnosis not present

## 2022-08-01 DIAGNOSIS — M542 Cervicalgia: Secondary | ICD-10-CM | POA: Diagnosis not present

## 2022-08-04 ENCOUNTER — Other Ambulatory Visit: Payer: Self-pay

## 2022-08-04 DIAGNOSIS — M542 Cervicalgia: Secondary | ICD-10-CM | POA: Diagnosis not present

## 2022-08-04 DIAGNOSIS — M79601 Pain in right arm: Secondary | ICD-10-CM | POA: Diagnosis not present

## 2022-08-04 DIAGNOSIS — M546 Pain in thoracic spine: Secondary | ICD-10-CM | POA: Diagnosis not present

## 2022-08-04 DIAGNOSIS — Z981 Arthrodesis status: Secondary | ICD-10-CM

## 2022-08-05 ENCOUNTER — Ambulatory Visit
Admission: RE | Admit: 2022-08-05 | Discharge: 2022-08-05 | Disposition: A | Discharge status: Home / Self Care | Payer: Medicare Other | Attending: Neurosurgery | Admitting: Neurosurgery

## 2022-08-05 ENCOUNTER — Ambulatory Visit (INDEPENDENT_AMBULATORY_CARE_PROVIDER_SITE_OTHER): Payer: Medicare Other | Admitting: Neurosurgery

## 2022-08-05 ENCOUNTER — Ambulatory Visit
Admission: RE | Admit: 2022-08-05 | Discharge: 2022-08-05 | Disposition: A | Discharge status: Home / Self Care | Payer: Medicare Other | Source: Ambulatory Visit | Attending: Neurosurgery | Admitting: Neurosurgery

## 2022-08-05 VITALS — BP 128/78 | Ht 65.0 in | Wt 164.0 lb

## 2022-08-05 DIAGNOSIS — Z09 Encounter for follow-up examination after completed treatment for conditions other than malignant neoplasm: Secondary | ICD-10-CM

## 2022-08-05 DIAGNOSIS — Z981 Arthrodesis status: Secondary | ICD-10-CM

## 2022-08-05 DIAGNOSIS — R29898 Other symptoms and signs involving the musculoskeletal system: Secondary | ICD-10-CM

## 2022-08-05 DIAGNOSIS — J387 Other diseases of larynx: Secondary | ICD-10-CM | POA: Diagnosis not present

## 2022-08-05 DIAGNOSIS — M4322 Fusion of spine, cervical region: Secondary | ICD-10-CM | POA: Diagnosis not present

## 2022-08-05 NOTE — Progress Notes (Signed)
REFERRING PHYSICIAN:  Mclean-scocuzza, Pasty Spillers, Md [address] [place],  [place] [address]  DOS: 05/12/22 ACDF C5-C7  HISTORY OF PRESENT ILLNESS: [name] is status post ACDF C5-C7.   Has been working with physical therapy.  She has been working very hard with therapy to improve her strength.   PHYSICAL EXAMINATION:  NEUROLOGICAL:  General: In no acute distress.   Awake, alert, oriented to person, place, and time.  Pupils equal round and reactive to light.  Facial tone is symmetric.  Tongue protrusion is midline.   Strength: Side Biceps Triceps Deltoid Interossei Grip Wrist Ext. Wrist Flex.  R 5 5 4+ 4+ 4+ 5 5  L 5 5 5 5 5 5 5    Incision c/d/I  Imaging:  CTA angio of head/neck 05/27/22:  FINDINGS: CT HEAD FINDINGS   Brain: Cerebral volume within normal limits. Small remote lacunar infarct at the posterior limb of the right internal capsule. No acute intracranial hemorrhage. No acute large vessel territory infarct. No mass lesion or midline shift. No hydrocephalus or extra-axial fluid collection.   Vascular: No abnormal hyperdense vessel.   Skull: Scalp soft tissues and calvarium within normal limits.   Sinuses/Orbits: Globes orbital soft tissues demonstrate no acute finding. Minimal mucosal thickening noted within the left sphenoid sinus. Paranasal sinuses mastoid air cells are otherwise clear.   Other: None.   Review of the MIP images confirms the above findings   CTA NECK FINDINGS   Aortic arch: Visualized aortic arch normal caliber with normal branch pattern. No stenosis about the origin the great vessels.   Right carotid system: Right common and internal carotid arteries patent without stenosis or dissection.   Left carotid system: Evaluation of the proximal left CCA somewhat limited by dense artifact from adjacent venous contamination. Visualized portions of the left common and internal carotid arteries patent without stenosis or dissection.    Vertebral arteries: Both vertebral arteries arise from the subclavian arteries. No visible proximal subclavian artery stenosis. Left vertebral artery dominant. Evaluation of the proximal left vertebral artery limited by adjacent venous contamination. Visualized portions of the vertebral arteries patent without stenosis or dissection.   Skeleton: No discrete or worrisome osseous lesions. Prior ACDF at C4 through C7.   Other neck: No other acute soft tissue abnormality within the neck.   Upper chest: Visualized upper chest demonstrates no acute finding.   Review of the MIP images confirms the above findings   CTA HEAD FINDINGS   Anterior circulation: Both internal carotid arteries widely patent to the termini without stenosis. A1 segments widely patent. Normal anterior communicating artery complex. Both anterior cerebral arteries widely patent to their distal aspects without stenosis. No M1 stenosis or occlusion. Normal MCA bifurcations. Distal MCA branches well perfused and symmetric.   Posterior circulation: Both V4 segments are somewhat tortuous but widely patent to the vertebrobasilar junction. Both PICA patent. Basilar patent to its distal aspect without stenosis. Superior cerebral arteries patent bilaterally. Both PCA supplied via the basilar as well as prominent bilateral posterior communicating arteries. Both PCAs are well perfused and patent distal aspects.   Venous sinuses: Patent allowing for timing of the contrast bolus.   Anatomic variants: None significant.  No aneurysm.   Review of the MIP images confirms the above findings   IMPRESSION: 1. Negative CTA of the head and neck. No large vessel occlusion, hemodynamically significant stenosis, or other acute vascular abnormality. 2. No other acute intracranial abnormality. 3. Small remote lacunar infarct at the posterior  limb of the right internal capsule.     Electronically Signed   By: Rise Mu  M.D.   On: 05/27/2022 03:02   MRI brain 05/27/22:  FINDINGS: Brain: Cerebral volume within normal limits. Few scattered foci of T2/FLAIR hyperintensity noted involving the supratentorial cerebral white matter, nonspecific, but most likely related to chronic microvascular ischemic disease, minimal in nature. Small remote lacunar infarct noted at the posterior limb of the right internal capsule.   No evidence for acute or subacute ischemia. Gray-white matter distinction maintained. No areas of chronic cortical infarction. No acute or chronic intracranial blood products.   No mass lesion, midline shift or mass effect. No hydrocephalus or extra-axial fluid collection. Pituitary gland suprasellar region normal.   Vascular: Major intracranial vascular flow voids are maintained.   Skull and upper cervical spine: Craniocervical junction normal. Bone marrow signal intensity within normal limits. No scalp soft tissue abnormality.   Sinuses/Orbits: Globes orbital soft tissues within normal limits. Paranasal sinuses and mastoid air cells are clear.   Other: None.   IMPRESSION: 1. No acute intracranial abnormality. 2. Small remote lacunar infarct at the posterior limb of the right internal capsule. 3. Underlying minimal chronic microvascular ischemic disease for age.     Electronically Signed   By: Rise Mu M.D.   On: 05/27/2022 02:44  Above imaging reviewed and I agree with findings.    Assessment / Plan: [name] continues to improve after the above surgery.   Has made significant progress.  She will continue physical therapy for now.  I will see her back in 3 months with x-rays.    Venetia Night MD  Dept of Neurosurgery

## 2022-08-06 ENCOUNTER — Encounter: Payer: Medicare Other | Admitting: Family Medicine

## 2022-09-01 ENCOUNTER — Encounter: Payer: Self-pay | Admitting: Physician Assistant

## 2022-09-01 ENCOUNTER — Ambulatory Visit: Payer: Medicare Other | Admitting: Physician Assistant

## 2022-09-01 VITALS — BP 110/80 | HR 75 | Ht 65.0 in | Wt 168.0 lb

## 2022-09-01 DIAGNOSIS — K5900 Constipation, unspecified: Secondary | ICD-10-CM | POA: Diagnosis not present

## 2022-09-01 DIAGNOSIS — R1314 Dysphagia, pharyngoesophageal phase: Secondary | ICD-10-CM | POA: Diagnosis not present

## 2022-09-01 MED ORDER — OMEPRAZOLE 20 MG PO CPDR: 20.0000 mg | DELAYED_RELEASE_CAPSULE | Freq: Every morning | ORAL | 5 refills | Status: DC

## 2022-09-01 NOTE — Patient Instructions (Signed)
We have sent the following medications to your pharmacy for you to pick up at your convenience:  Omeprazole   Continue Famotidine.   Continue Miralax.   Start Fiber Supplement -A high fiber diet with plenty of fluids (up to 8 glasses of water daily) is suggested to relieve these symptoms.  Metamucil, 1 tablespoon once or twice daily can be used to keep bowels regular if needed.   You have been scheduled for an endoscopy. Please follow written instructions given to you at your visit today. If you use inhalers (even only as needed), please bring them with you on the day of your procedure.  Due to recent changes in healthcare laws, you may see the results of your imaging and laboratory studies on MyChart before your provider has had a chance to review them.  We understand that in some cases there may be results that are confusing or concerning to you. Not all laboratory results come back in the same time frame and the provider may be waiting for multiple results in order to interpret others.  Please give [place] 48 hours in order for your provider to thoroughly review all the results before contacting the office for clarification of your results.   Thank you for choosing me and [facility].  Hyacinth Meeker PA -C

## 2022-09-01 NOTE — Progress Notes (Signed)
Agree with assessment and plan as outlined.  
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
## 2022-09-02 DIAGNOSIS — J101 Influenza due to other identified influenza virus with other respiratory manifestations: Secondary | ICD-10-CM | POA: Diagnosis not present

## 2022-09-02 DIAGNOSIS — R059 Cough, unspecified: Secondary | ICD-10-CM | POA: Diagnosis not present

## 2022-09-02 DIAGNOSIS — Z20822 Contact with and (suspected) exposure to covid-19: Secondary | ICD-10-CM | POA: Diagnosis not present
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
## 2022-09-10 ENCOUNTER — Encounter: Payer: Self-pay | Admitting: Cardiology

## 2022-09-10 ENCOUNTER — Ambulatory Visit: Payer: Medicare Other | Attending: Cardiology | Admitting: Cardiology

## 2022-09-10 VITALS — BP 120/80 | HR 70 | Wt 169.4 lb

## 2022-09-10 DIAGNOSIS — I6381 Other cerebral infarction due to occlusion or stenosis of small artery: Secondary | ICD-10-CM | POA: Diagnosis not present

## 2022-09-10 DIAGNOSIS — E039 Hypothyroidism, unspecified: Secondary | ICD-10-CM | POA: Diagnosis not present

## 2022-09-10 DIAGNOSIS — I10 Essential (primary) hypertension: Secondary | ICD-10-CM

## 2022-09-10 NOTE — Patient Instructions (Signed)
Medication Instructions:   Please continue to take current medications.  *If you need a refill on your cardiac medications before your next appointment, please call your pharmacy*   Lab Work:  NONE  If you have labs (blood work) drawn today and your tests are completely normal, you will receive your results only by: MyChart Message (if you have MyChart) OR A paper copy in the mail If you have any lab test that is abnormal or we need to change your treatment, we will call you to review the results.   Testing/Procedures:  NONE   Follow-Up: At [facility], you and your health needs are our priority.  As part of our continuing mission to provide you with exceptional heart care, we have created designated Provider Care Teams.  These Care Teams include your primary Cardiologist (physician) and Advanced Practice Providers (APPs -  Physician Assistants and Nurse Practitioners) who all work together to provide you with the care you need, when you need it.  We recommend signing up for the patient portal called "MyChart".  Sign up information is provided on this After Visit Summary.  MyChart is used to connect with patients for Virtual Visits (Telemedicine).  Patients are able to view lab/test results, encounter notes, upcoming appointments, etc.  Non-urgent messages can be sent to your provider as well.   To learn more about what you can do with MyChart, go to [email or web address].    Your next appointment:   6 month(s)  The format for your next appointment:   In Person  Provider:   You may see or one of the following Advanced Practice Providers on your designated Care Team:   Nicolasa Ducking, NP Eula Listen, PA-C Cadence Fransico Michael, PA-C Charlsie Quest, NP   Important Information About Sugar

## 2022-09-25 ENCOUNTER — Encounter: Payer: Medicare Other | Admitting: Family Medicine

## 2022-09-25 DIAGNOSIS — E039 Hypothyroidism, unspecified: Secondary | ICD-10-CM

## 2022-09-25 DIAGNOSIS — F339 Major depressive disorder, recurrent, unspecified: Secondary | ICD-10-CM

## 2022-09-25 DIAGNOSIS — J4530 Mild persistent asthma, uncomplicated: Secondary | ICD-10-CM

## 2022-09-25 DIAGNOSIS — M810 Age-related osteoporosis without current pathological fracture: Secondary | ICD-10-CM

## 2022-09-25 DIAGNOSIS — K219 Gastro-esophageal reflux disease without esophagitis: Secondary | ICD-10-CM

## 2022-09-25 DIAGNOSIS — F419 Anxiety disorder, unspecified: Secondary | ICD-10-CM

## 2022-09-25 DIAGNOSIS — F4312 Post-traumatic stress disorder, chronic: Secondary | ICD-10-CM

## 2022-09-25 DIAGNOSIS — I10 Essential (primary) hypertension: Secondary | ICD-10-CM

## 2022-09-30 ENCOUNTER — Ambulatory Visit: Payer: Medicare Other | Admitting: Nurse Practitioner

## 2022-10-07 ENCOUNTER — Ambulatory Visit: Payer: Medicare Other | Admitting: Nurse Practitioner

## 2022-10-08 ENCOUNTER — Ambulatory Visit: Payer: Medicare Other | Admitting: Nurse Practitioner

## 2022-10-16 DIAGNOSIS — S92413A Displaced fracture of proximal phalanx of unspecified great toe, initial encounter for closed fracture: Secondary | ICD-10-CM | POA: Insufficient documentation

## 2022-10-16 DIAGNOSIS — S92511A Displaced fracture of proximal phalanx of right lesser toe(s), initial encounter for closed fracture: Secondary | ICD-10-CM | POA: Diagnosis not present

## 2022-10-22 ENCOUNTER — Encounter: Payer: Self-pay | Admitting: Gastroenterology

## 2022-10-22 ENCOUNTER — Telehealth: Payer: Self-pay

## 2022-10-22 NOTE — Telephone Encounter (Signed)
Patient has a appointment schedule with our office on 12/01/2022. She has been seeing [facility] in the past and last appointment was 09/01/2022. Called patient to find out why she is transferring and she states she was supposed to have a procedure with them and she would need a driver. She states she lives alone and she can not find no one to bring her to [place] but has someone that could take her somewhere in [place]. Patient is want to transfer to office closer to house since she lives in [place]. Please advise if the transfer is okay

## 2022-10-23 ENCOUNTER — Encounter: Payer: Medicare Other | Admitting: Gastroenterology

## 2022-10-23 ENCOUNTER — Other Ambulatory Visit: Payer: Self-pay

## 2022-10-23 DIAGNOSIS — R1310 Dysphagia, unspecified: Secondary | ICD-10-CM

## 2022-10-23 NOTE — Telephone Encounter (Signed)
Looks like she needs an EGD for history of dysphagia and dilation.  For this procedure, she would also need a driver even if she lives in [place].  Not sure what her plans are. I do not mind seeing her in the office though.  We can arrange EGD before I see her in the office  RV

## 2022-10-23 NOTE — Telephone Encounter (Signed)
Patient is okay with schedule EGD before her appointment. Scheduled for 11/05/2022 went over instructions, mailed and sent to [facility] account. She will see [place] in the office on 12/01/2022

## 2022-10-24 ENCOUNTER — Telehealth: Payer: Self-pay

## 2022-10-24 NOTE — Telephone Encounter (Signed)
Patient has a new patient appointment with her new pcp and can not reschedule this appointment. She is wondering if she can reschedule her EGD. Moved patient to 11/11/2022 Called endo and talk to Almyra Free and she will get patient reschedule

## 2022-10-27 DIAGNOSIS — M810 Age-related osteoporosis without current pathological fracture: Secondary | ICD-10-CM | POA: Diagnosis not present

## 2022-10-27 DIAGNOSIS — E063 Autoimmune thyroiditis: Secondary | ICD-10-CM | POA: Diagnosis not present

## 2022-10-28 ENCOUNTER — Ambulatory Visit
Admission: RE | Admit: 2022-10-28 | Discharge: 2022-10-28 | Disposition: A | Discharge status: Home / Self Care | Payer: Medicare Other | Source: Ambulatory Visit | Attending: Student in an Organized Health Care Education/Training Program | Admitting: Student in an Organized Health Care Education/Training Program

## 2022-10-28 ENCOUNTER — Encounter: Payer: Self-pay | Admitting: Student in an Organized Health Care Education/Training Program

## 2022-10-28 ENCOUNTER — Ambulatory Visit (HOSPITAL_BASED_OUTPATIENT_CLINIC_OR_DEPARTMENT_OTHER): Payer: Medicare Other | Admitting: Student in an Organized Health Care Education/Training Program

## 2022-10-28 VITALS — BP 125/89 | HR 73 | Temp 98.1°F | Resp 16 | Ht 65.0 in | Wt 168.0 lb

## 2022-10-28 DIAGNOSIS — M25511 Pain in right shoulder: Secondary | ICD-10-CM | POA: Insufficient documentation

## 2022-10-28 DIAGNOSIS — Q761 Klippel-Feil syndrome: Secondary | ICD-10-CM | POA: Insufficient documentation

## 2022-10-28 DIAGNOSIS — G8929 Other chronic pain: Secondary | ICD-10-CM | POA: Insufficient documentation

## 2022-10-28 DIAGNOSIS — M542 Cervicalgia: Secondary | ICD-10-CM | POA: Insufficient documentation

## 2022-10-28 DIAGNOSIS — M533 Sacrococcygeal disorders, not elsewhere classified: Secondary | ICD-10-CM | POA: Insufficient documentation

## 2022-10-28 DIAGNOSIS — M19011 Primary osteoarthritis, right shoulder: Secondary | ICD-10-CM | POA: Diagnosis not present

## 2022-10-28 DIAGNOSIS — G5701 Lesion of sciatic nerve, right lower limb: Secondary | ICD-10-CM

## 2022-10-28 DIAGNOSIS — M47818 Spondylosis without myelopathy or radiculopathy, sacral and sacrococcygeal region: Secondary | ICD-10-CM | POA: Diagnosis not present

## 2022-10-28 DIAGNOSIS — G894 Chronic pain syndrome: Secondary | ICD-10-CM | POA: Insufficient documentation

## 2022-10-28 DIAGNOSIS — S92511A Displaced fracture of proximal phalanx of right lesser toe(s), initial encounter for closed fracture: Secondary | ICD-10-CM | POA: Diagnosis not present

## 2022-10-28 NOTE — Progress Notes (Signed)
PROVIDER NOTE: Information contained herein reflects review and annotations entered in association with encounter. Interpretation of such information and data should be left to medically-trained personnel. Information provided to patient can be located elsewhere in the medical record under "Patient Instructions". Document created using STT-dictation technology, any transcriptional errors that may result from process are unintentional.    Patient: [name]  Service Category: E/M  Provider: Gillis Santa, MD  DOB: 1956-08-30  DOS: 10/28/2022  Referring Provider: Orland Mustard *  MRN: [id number]  Specialty: Interventional Pain Management  PCP: McLean-Scocuzza, Nino Glow, MD (Inactive)  Type: Established Patient  Setting: Ambulatory outpatient    Location: Office  Delivery: Face-to-face     HPI  [name], a 67 y.o. year old female, is here today because of her Piriformis syndrome, right [G57.01]. Ms. Kerce primary complain today is Back Pain (Right ), Shoulder Pain (Right ), and Neck Pain (Bilateral ) Last encounter: My last encounter with her was on 10/03/21 Pain Assessment: Severity of Chronic pain is reported as a 8 /10. Location: Back Lower, Right/back pain down the right leg, ? neck pain into right arm. Onset: More than a month ago. Quality: Discomfort, Constant, Numbness, Tingling. Timing: Constant. Modifying factor(s): procedure helped, patches. Vitals:  height is 5\' 5"  (1.651 m) and weight is 168 lb (76.2 kg). Her temporal temperature is 98.1 F (36.7 C). Her blood pressure is 125/89 and her pulse is 73. Her respiration is 16 and oxygen saturation is 100%.  BMI: Estimated body mass index is 27.96 kg/m as calculated from the following:   Height as of this encounter: 5\' 5"  (1.651 m).   Weight as of this encounter: 168 lb (76.2 kg).  Reason for encounter:  Patient's last visit with me was 10/03/2021 which was a postprocedural follow-up after her right SI joint and piriformis  injection performed 09/02/2021 which provided her with 80% pain relief for over 6 months.  She states that the pain is gradually returned in that area and she would like to repeat injections.  Since her last visit with me, she is also had a cervical fusion with Dr. Cari Caraway (August 2023).  She endorses right shoulder pain and radiation of her cervical spine pain into her right arm. Still has issues with right hand grip. She states that her mobility is improving but continues to have pain in her anterior shoulder. She is continuing PT at home.  She has had a TIA since her last visit with me. She was started on ASA 81 mg at that time.  Unfortunately, she has also sustained a closed fracture of her proximal phalanx which she has seen [facility] for.  Her right foot is currently in a boot.   ROS  Constitutional: Denies any fever or chills Gastrointestinal: No reported hemesis, hematochezia, vomiting, or acute GI distress Musculoskeletal:  right SI joint and piriformis pain, left cervical spine pain, right anterior shoulder pain. Neurological: No reported episodes of acute onset apraxia, aphasia, dysarthria, agnosia, amnesia, paralysis, loss of coordination, or loss of consciousness  Medication Review  Ascorbic Acid, Aspirin, Calcium Citrate, Multiple Vitamins-Calcium, Probiotic Product, Turmeric, Vitamin E, amLODipine, atorvastatin, cyclobenzaprine, famotidine, levothyroxine, omeprazole, oxybutynin, polyethylene glycol powder, traZODone, and triamterene-hydrochlorothiazide  History Review  Allergy: Ms. Register has No Known Allergies. Drug: Ms. Romano  reports no history of drug use. Alcohol:  reports current alcohol use of about 1.0 standard drink of alcohol per week. Tobacco:  reports that she has never smoked. She has never used smokeless  tobacco. Social: Ms. Rosa  reports that she has never smoked. She has never used smokeless tobacco. She reports current alcohol use of about 1.0 standard  drink of alcohol per week. She reports that she does not use drugs. Medical:  has a past medical history of Anemia, Anxiety, Arthritis, Bronchitis, COVID-19 (07/08/2019), Depression, GERD (gastroesophageal reflux disease), Hypertension, Hypothyroidism, Mini stroke, Osteoporosis, PTSD (post-traumatic stress disorder), Stroke ([place]), and Wears glasses. Surgical: Ms. Goodson  has a past surgical history that includes Back surgery; Breast surgery; Augmentation mammaplasty (Bilateral, 2006); Esophagogastroduodenoscopy (egd) with propofol (N/A, 10/24/2020); Esophagogastroduodenoscopy (egd) with propofol (N/A, 12/20/2020); Esophageal manometry (N/A, 03/04/2021); 24 hour ph study (N/A, 03/04/2021); PH impedance study (N/A, 03/04/2021); Vein Surgery (1991); Upper gastrointestinal endoscopy; Colonoscopy; and Anterior cervical decomp/discectomy fusion (N/A, 05/12/2022). Family: family history includes Cancer in her father and sister; Colon cancer in her sister; Dementia in her mother; Diabetes in her father; Diabetes Mellitus I in her son; Heart Problems in her father; Heart disease in her father, mother, sister, and sister; Hypertension in her mother; Lung cancer in her maternal uncle; Osteoporosis in her mother; Ovarian cancer in her sister; Sickle cell trait in her daughter; Skin cancer in an other family member; Vitiligo in her mother and son.  Laboratory Chemistry Profile   Renal Lab Results  Component Value Date   BUN 28 (H) 05/26/2022   CREATININE 0.72 05/26/2022   GFR 79.41 11/27/2021   GFRAA >60 10/04/2019   GFRNONAA >60 05/26/2022    Hepatic Lab Results  Component Value Date   AST 30 11/27/2021   ALT 28 11/27/2021   ALBUMIN 4.3 11/27/2021   ALKPHOS 55 11/27/2021    Electrolytes Lab Results  Component Value Date   NA 140 05/26/2022   K 3.9 05/26/2022   CL 109 05/26/2022   CALCIUM 9.5 05/26/2022    Bone Lab Results  Component Value Date   VD25OH 71.21 04/02/2021    Inflammation (CRP:  Acute Phase) (ESR: Chronic Phase) No results found for: "CRP", "ESRSEDRATE", "LATICACIDVEN"       Note: Above Lab results reviewed.  Recent Imaging Review  DG Cervical Spine 2 or 3 views CLINICAL DATA:  Cervical fusion  EXAM: CERVICAL SPINE - 2-3 VIEW  COMPARISON:  06/24/2022  FINDINGS: C4-C7 anterior cervical discectomy and fusion with instrumentation has been performed. Solid incorporation of interbody bone graft at C4-5. Interbody bone cages are noted at C5-6 and C6-7. No acute fracture or listhesis of the cervical spine. Vertebral body height is preserved. Mild intervertebral disc space narrowing is again seen at C7-T1 in keeping with changes of mild degenerative disc disease. Remaining intervertebral disc heights are preserved. Prevertebral soft tissues are thickened anterior to the operative site most in keeping with postoperative edema, similar to prior examination. No subcutaneous gas. There is thickening of the epiglottis which may reflect postoperative edema, but is nonspecific. The hypopharynx is not distended. Spinal canal is widely patent.  IMPRESSION: 1. C4-C7 anterior cervical discectomy and fusion with instrumentation. Solid incorporation of interbody bone graft at C4-5. Stable overall straightening. 2. Thickening of the epiglottis, nonspecific. Close attention on follow-up examinations versus direct visualization may be warranted.  Electronically Signed   By: Fidela Salisbury M.D.   On: 08/06/2022 21:20 Note: Reviewed        Physical Exam  General appearance: Well nourished, well developed, and well hydrated. In no apparent acute distress Mental status: Alert, oriented x 3 (person, place, & time)       Respiratory: No evidence  of acute respiratory distress Eyes: PERLA Vitals: BP 125/89 (BP Location: Right Arm, Patient Position: Sitting, Cuff Size: Normal)   Pulse 73   Temp 98.1 F (36.7 C) (Temporal)   Resp 16   Ht 5\' 5"  (1.651 m)   Wt 168 lb (76.2  kg)   SpO2 100%   BMI 27.96 kg/m  BMI: Estimated body mass index is 27.96 kg/m as calculated from the following:   Height as of this encounter: 5\' 5"  (1.651 m).   Weight as of this encounter: 168 lb (76.2 kg). Ideal: Ideal body weight: 57 kg (125 lb 10.6 oz) Adjusted ideal body weight: 64.7 kg (142 lb 9.6 oz)  Cervical Spine Area Exam  Skin & Axial Inspection: Well healed scar from previous spine surgery detected Alignment: Symmetrical Functional ROM: Pain restricted ROM, to the left Stability: No instability detected Muscle Tone/Strength: Functionally intact. No obvious neuro-muscular anomalies detected. Sensory (Neurological): Neurogenic pain pattern Palpation: No palpable anomalies              Upper Extremity (UE) Exam    Side: Right upper extremity  Side: Left upper extremity  Skin & Extremity Inspection: Skin color, temperature, and hair growth are WNL. No peripheral edema or cyanosis. No masses, redness, swelling, asymmetry, or associated skin lesions. No contractures.  Skin & Extremity Inspection: Skin color, temperature, and hair growth are WNL. No peripheral edema or cyanosis. No masses, redness, swelling, asymmetry, or associated skin lesions. No contractures.  Functional ROM: Pain restricted ROM for shoulder  Functional ROM: Unrestricted ROM          Muscle Tone/Strength: Functionally intact. No obvious neuro-muscular anomalies detected.  Muscle Tone/Strength: Functionally intact. No obvious neuro-muscular anomalies detected.  Sensory (Neurological): Arthropathic arthralgia and possibly neurogenic          Sensory (Neurological): Unimpaired          Palpation: No palpable anomalies              Palpation: No palpable anomalies              Provocative Test(s):  Phalen's test: deferred Tinel's test: deferred Apley's scratch test (touch opposite shoulder):  Action 1 (Across chest): Decreased ROM Action 2 (Overhead): Decreased ROM Action 3 (LB reach): Decreased ROM    Provocative Test(s):  Phalen's test: deferred Tinel's test: deferred Apley's scratch test (touch opposite shoulder):  Action 1 (Across chest): deferred Action 2 (Overhead): deferred Action 3 (LB reach): deferred    Lumbar Spine Area Exam  Skin & Axial Inspection: No masses, redness, or swelling Alignment: Symmetrical Functional ROM: Unrestricted ROM       Stability: No instability detected Muscle Tone/Strength: Functionally intact. No obvious neuro-muscular anomalies detected. Sensory (Neurological): Musculoskeletal pain pattern Palpation: No palpable anomalies       Provocative Tests: Hyperextension/rotation test: deferred today       Lumbar quadrant test (Kemp's test): deferred today       Lateral bending test: deferred today       Patrick's Maneuver: (+) for right-sided S-I arthralgia             FABER* test: (+) for right-sided S-I arthralgia             S-I anterior distraction/compression test: (+)   S-I arthralgia/arthropathy S-I lateral compression test: Positive for   S-I arthralgia/arthropathy S-I Thigh-thrust test:Positive for   S-I arthralgia/arthropathy     S-I Gaenslen's test: Positive for   S-I arthralgia/arthropathy     Gait & Posture  Assessment  Ambulation: Unassisted Gait: Relatively normal for age and body habitus Posture: WNL  Lower Extremity Exam      Side: Right lower extremity   Side: Left lower extremity  Stability: No instability observed           Stability: No instability observed          Skin & Extremity Inspection:right foot in boot 2/2 to toe fracture   Skin & Extremity Inspection: Skin color, temperature, and hair growth are WNL. No peripheral edema or cyanosis. No masses, redness, swelling, asymmetry, or associated skin lesions. No contractures.  Functional ROM: pain restricted ROM of right foot                  Functional ROM: Unrestricted ROM                  Muscle Tone/Strength: Functionally intact. No obvious neuro-muscular anomalies  detected.   Muscle Tone/Strength: Functionally intact. No obvious neuro-muscular anomalies detected.  Sensory (Neurological): MSK        Sensory (Neurological): Unimpaired        DTR: Patellar: deferred today Achilles: deferred today Plantar: deferred today   DTR: Patellar: deferred today Achilles: deferred today Plantar: deferred today  Palpation: No palpable anomalies   Palpation: No palpable anomalies    Assessment   Diagnosis Status  1. Piriformis syndrome, right   2. SI joint arthritis   3. Chronic right SI joint pain   4. Chronic right shoulder pain   5. Cervical fusion syndrome   6. Chronic pain syndrome   7. Cervicalgia    Controlled Controlled Controlled   Updated Problems: Problem  Chronic Right Shoulder Pain  Cervical Fusion Syndrome    Plan of Care  1. Piriformis syndrome, right - TRIGGER POINT INJECTION  2. SI joint arthritis - SACROILIAC JOINT INJECTION; Future  3. Chronic right SI joint pain - SACROILIAC JOINT INJECTION; Future  4. Chronic right shoulder pain - DG Shoulder Right; Future  5. Cervical fusion syndrome - DG Cervical Spine Complete; Future  6. Chronic pain syndrome - SACROILIAC JOINT INJECTION; Future - TRIGGER POINT INJECTION - DG Shoulder Right; Future - DG Cervical Spine Complete; Future  7. Cervicalgia - DG Cervical Spine Complete; Future    Orders:  Orders Placed This Encounter  Procedures   SACROILIAC JOINT INJECTION    Standing Status:   Future    Standing Expiration Date:   01/26/2023    Scheduling Instructions:     Right SI-J    Order Specific Question:   Where will this procedure be performed?    Answer:   [facility]   TRIGGER POINT INJECTION    Area: Buttocks region (gluteal area) Indications: Piriformis muscle pain; Right (G57.01) piriformis-syndrome; piriformis muscle spasms (J50.093). CPT code: 81829    Scheduling Instructions:     Right piriformis TPI    Order Specific Question:   Where will  this procedure be performed?    Answer:   [facility]   DG Shoulder Right    Please make sure that the patient understands that this needs to be done as soon as possible. Never have the patient do the imaging "just before the next appointment". Inform patient that having the imaging done within the [facility] will expedite the availability of the results and will provide imaging availability to the requesting physician. In addition inform the patient that the imaging order has an expiration date and will not be renewed if  not done within the active period.    Standing Status:   Future    Number of Occurrences:   1    Standing Expiration Date:   11/26/2022    Scheduling Instructions:     Imaging must be done as soon as possible. Inform patient that order will expire within 30 days and I will not renew it.    Order Specific Question:   Reason for Exam (SYMPTOM  OR DIAGNOSIS REQUIRED)    Answer:   Right shoulder pain    Order Specific Question:   Preferred imaging location?    Answer:   [facility]    Order Specific Question:   Call Results- Best Contact Number?    Answer:   [phone] [facility])    Order Specific Question:   Release to patient    Answer:   Immediate   DG Cervical Spine Complete    Patient presents with axial pain with possible radicular component. Please assist [place] in identifying specific level(s) and laterality of any additional findings such as: 1. Facet (Zygapophyseal) joint DJD (Hypertrophy, space narrowing, subchondral sclerosis, and/or osteophyte formation) 2. DDD and/or IVDD (Loss of disc height, desiccation, gas patterns, osteophytes, endplate sclerosis, or "Black disc disease") 3. Pars defects 4. Spondylolisthesis, spondylosis, and/or spondyloarthropathies (include Degree/Grade of displacement in mm) (stability) 5. Vertebral body Fractures (acute/chronic) (state percentage of collapse) 6. Demineralization (osteopenia/osteoporotic) 7. Bone  pathology 8. Foraminal narrowing  9. Surgical changes    Standing Status:   Future    Number of Occurrences:   1    Standing Expiration Date:   01/26/2023    Scheduling Instructions:     Please make sure that the patient understands that this needs to be done as soon as possible. Never have the patient do the imaging "just before the next appointment". Inform patient that having the imaging done within the [facility] will expedite the availability of the results and will provide      imaging availability to the requesting physician. In addition inform the patient that the imaging order has an expiration date and will not be renewed if not done within the active period.    Order Specific Question:   Reason for Exam (SYMPTOM  OR DIAGNOSIS REQUIRED)    Answer:   Cervicalgia    Order Specific Question:   Preferred imaging location?    Answer:   [facility]    Order Specific Question:   Call Results- Best Contact Number?    Answer:   [phone]   Follow-up plan:   Return in about 1 week (around 11/04/2022) for Right SI-J & Piriformis , in clinic [facility].     Right SI-J and Right Piriformis 04/17/21, 09/02/21, 80% pain relief for first 5 months months with first injection, 10 months pain relief after second injection    Recent Visits No visits were found meeting these conditions. Showing recent visits within past 90 days and meeting all other requirements Today's Visits Date Type Provider Dept  10/28/22 Office Visit Edward Jolly, MD [facility]  Showing today's visits and meeting all other requirements Future Appointments No visits were found meeting these conditions. Showing future appointments within next 90 days and meeting all other requirements  I discussed the assessment and treatment plan with the patient. The patient was provided an opportunity to ask questions and all were answered. The patient agreed with the plan and demonstrated an understanding of the  instructions.  Patient advised to call back or  seek an in-person evaluation if the symptoms or condition worsens.  Duration of encounter: .  Total time on encounter, as per AMA guidelines included both the face-to-face and non-face-to-face time personally spent by the physician and/or other qualified health care professional(s) on the day of the encounter (includes time in activities that require the physician or other qualified health care professional and does not include time in activities normally performed by clinical staff). Physician's time may include the following activities when performed: Preparing to see the patient (e.g., pre-charting review of records, searching for previously ordered imaging, lab work, and nerve conduction tests) Review of prior analgesic pharmacotherapies. Reviewing PMP Interpreting ordered tests (e.g., lab work, imaging, nerve conduction tests) Performing post-procedure evaluations, including interpretation of diagnostic procedures Obtaining and/or reviewing separately obtained history Performing a medically appropriate examination and/or evaluation Counseling and educating the patient/family/caregiver Ordering medications, tests, or procedures Referring and communicating with other health care professionals (when not separately reported) Documenting clinical information in the electronic or other health record Independently interpreting results (not separately reported) and communicating results to the patient/ family/caregiver Care coordination (not separately reported)  Note by: Edward Jolly, MD Date: 10/28/2022; Time: 2:18 PM

## 2022-10-28 NOTE — Progress Notes (Signed)
Safety precautions to be maintained throughout the outpatient stay will include: orient to surroundings, keep bed in low position, maintain call bell within reach at all times, provide assistance with transfer out of bed and ambulation.  

## 2022-10-28 NOTE — Patient Instructions (Signed)
____________________________________________________________________________________________  General Risks and Possible Complications  Patient Responsibilities: It is important that you read this as it is part of your informed consent. It is our duty to inform you of the risks and possible complications associated with treatments offered to you. It is your responsibility as a patient to read this and to ask questions about anything that is not clear or that you believe was not covered in this document.  Patient's Rights: You have the right to refuse treatment. You also have the right to change your mind, even after initially having agreed to have the treatment done. However, under this last option, if you wait until the last second to change your mind, you may be charged for the materials used up to that point.  Introduction: Medicine is not an Chief Strategy Officer. Everything in Medicine, including the lack of treatment(s), carries the potential for danger, harm, or loss (which is by definition: Risk). In Medicine, a complication is a secondary problem, condition, or disease that can aggravate an already existing one. All treatments carry the risk of possible complications. The fact that a side effects or complications occurs, does not imply that the treatment was conducted incorrectly. It must be clearly understood that these can happen even when everything is done following the highest safety standards.  No treatment: You can choose not to proceed with the proposed treatment alternative. The "PRO(s)" would include: avoiding the risk of complications associated with the therapy. The "CON(s)" would include: not getting any of the treatment benefits. These benefits fall under one of three categories: diagnostic; therapeutic; and/or palliative. Diagnostic benefits include: getting information which can ultimately lead to improvement of the disease or symptom(s). Therapeutic benefits are those associated with  the successful treatment of the disease. Finally, palliative benefits are those related to the decrease of the primary symptoms, without necessarily curing the condition (example: decreasing the pain from a flare-up of a chronic condition, such as incurable terminal cancer).  General Risks and Complications: These are associated to most interventional treatments. They can occur alone, or in combination. They fall under one of the following six (6) categories: no benefit or worsening of symptoms; bleeding; infection; nerve damage; allergic reactions; and/or death. No benefits or worsening of symptoms: In Medicine there are no guarantees, only probabilities. No healthcare provider can ever guarantee that a medical treatment will work, they can only state the probability that it may. Furthermore, there is always the possibility that the condition may worsen, either directly, or indirectly, as a consequence of the treatment. Bleeding: This is more common if the patient is taking a blood thinner, either prescription or over the counter (example: Goody Powders, Fish oil, Aspirin, Garlic, etc.), or if suffering a condition associated with impaired coagulation (example: Hemophilia, cirrhosis of the liver, low platelet counts, etc.). However, even if you do not have one on these, it can still happen. If you have any of these conditions, or take one of these drugs, make sure to notify your treating physician. Infection: This is more common in patients with a compromised immune system, either due to disease (example: diabetes, cancer, human immunodeficiency virus [HIV], etc.), or due to medications or treatments (example: therapies used to treat cancer and rheumatological diseases). However, even if you do not have one on these, it can still happen. If you have any of these conditions, or take one of these drugs, make sure to notify your treating physician. Nerve Damage: This is more common when the treatment is an  invasive one, but it can also happen with the use of medications, such as those used in the treatment of cancer. The damage can occur to small secondary nerves, or to large primary ones, such as those in the spinal cord and brain. This damage may be temporary or permanent and it may lead to impairments that can range from temporary numbness to permanent paralysis and/or brain death. Allergic Reactions: Any time a substance or material comes in contact with our body, there is the possibility of an allergic reaction. These can range from a mild skin rash (contact dermatitis) to a severe systemic reaction (anaphylactic reaction), which can result in death. Death: In general, any medical intervention can result in death, most of the time due to an unforeseen complication. ____________________________________________________________________________________________    ______________________________________________________________________  Preparing for your procedure  During your procedure appointment there will be: No Prescription Refills. No disability issues to discussed. No medication changes or discussions.  Instructions: Food intake: Avoid eating anything solid for at least 8 hours prior to your procedure. Clear liquid intake: You may take clear liquids such as water up to 2 hours prior to your procedure. (No carbonated drinks. No soda.) Transportation: Unless otherwise stated by your physician, bring a driver. Morning Medicines: Except for blood thinners, take all of your other morning medications with a sip of water. Make sure to take your heart and blood pressure medicines. If your blood pressure's lower number is above 100, the case will be rescheduled. Blood thinners: Make sure to stop your blood thinners as instructed.  If you take a blood thinner, but were not instructed to stop it, call our office [phone] and ask to talk to a nurse. Not stopping a blood thinner prior to certain  procedures could lead to serious complications. Diabetics on insulin: Notify the staff so that you can be scheduled 1st case in the morning. If your diabetes requires high dose insulin, take only  of your normal insulin dose the morning of the procedure and notify the staff that you have done so. Preventing infections: Shower with an antibacterial soap the morning of your procedure.  Build-up your immune system: Take 1000 mg of Vitamin C with every meal (3 times a day) the day prior to your procedure. Antibiotics: Inform the nursing staff if you are taking any antibiotics or if you have any conditions that may require antibiotics prior to procedures. (Example: recent joint implants)   Pregnancy: If you are pregnant make sure to notify the nursing staff. Not doing so may result in injury to the fetus, including death.  Sickness: If you have a cold, fever, or any active infections, call and cancel or reschedule your procedure. Receiving steroids while having an infection may result in complications. Arrival: You must be in the facility at least 30 minutes prior to your scheduled procedure. Tardiness: Your scheduled time is also the cutoff time. If you do not arrive at least 15 minutes prior to your procedure, you will be rescheduled.  Children: Do not bring any children with you. Make arrangements to keep them home. Dress appropriately: There is always a possibility that your clothing may get soiled. Avoid long dresses. Valuables: Do not bring any jewelry or valuables.  Reasons to call and reschedule or cancel your procedure: (Following these recommendations will minimize the risk of a serious complication.) Surgeries: Avoid having procedures within 2 weeks of any surgery. (Avoid for 2 weeks before or after any surgery). Flu Shots: Avoid having procedures within 2  weeks of a flu shots or . (Avoid for 2 weeks before or after immunizations). Barium: Avoid having a procedure within 7-10 days after having  had a radiological study involving the use of radiological contrast. (Myelograms, Barium swallow or enema study). Heart attacks: Avoid any elective procedures or surgeries for the initial 6 months after a "Myocardial Infarction" (Heart Attack). Blood thinners: It is imperative that you stop these medications before procedures. Let us know if you if you take any blood thinner.  Infection: Avoid procedures during or within two weeks of an infection (including chest colds or gastrointestinal problems). Symptoms associated with infections include: Localized redness, fever, chills, night sweats or profuse sweating, burning sensation when voiding, cough, congestion, stuffiness, runny nose, sore throat, diarrhea, nausea, vomiting, cold or Flu symptoms, recent or current infections. It is specially important if the infection is over the area that we intend to treat. Heart and lung problems: Symptoms that may suggest an active cardiopulmonary problem include: cough, chest pain, breathing difficulties or shortness of breath, dizziness, ankle swelling, uncontrolled high or unusually low blood pressure, and/or palpitations. If you are experiencing any of these symptoms, cancel your procedure and contact your primary care physician for an evaluation.  Remember:  Regular Business hours are:  Monday to Thursday 8:00 AM to 4:00 PM  Provider's Schedule: Milinda Pointer, MD:  Procedure days: Tuesday and Thursday 7:30 AM to 4:00 PM  Gillis Santa, MD:  Procedure days: Monday and Wednesday 7:30 AM to 4:00 PM  ______________________________________________________________________   Sacroiliac (SI) Joint Injection Patient Information  Description: The sacroiliac joint connects the scrum (very low back and tailbone) to the ilium (a pelvic bone which also forms half of the hip joint).  Normally this joint experiences very little motion.  When this joint becomes inflamed or unstable low back and or hip and pelvis pain  may result.  Injection of this joint with local anesthetics (numbing medicines) and steroids can provide diagnostic information and reduce pain.  This injection is performed with the aid of x-ray guidance into the tailbone area while you are lying on your stomach.   You may experience an electrical sensation down the leg while this is being done.  You may also experience numbness.  We also may ask if we are reproducing your normal pain during the injection.  Conditions which may be treated SI injection:  Low back, buttock, hip or leg pain  Preparation for the Injection:  Do not eat any solid food or dairy products within 8 hours of your appointment.  You may drink clear liquids up to 3 hours before appointment.  Clear liquids include water, black coffee, juice or soda.  No milk or cream please. You may take your regular medications, including pain medications with a sip of water before your appointment.  Diabetics should hold regular insulin (if take separately) and take 1/2 normal NPH dose the morning of the procedure.  Carry some sugar containing items with you to your appointment. A driver must accompany you and be prepared to drive you home after your procedure. Bring all of your current medications with you. An IV may be inserted and sedation may be given at the discretion of the physician. A blood pressure cuff, EKG and other monitors will often be applied during the procedure.  Some patients may need to have extra oxygen administered for a short period.  You will be asked to provide medical information, including your allergies, prior to the procedure.  We must know immediately  if you are taking blood thinners (like Coumadin/Warfarin) or if you are allergic to IV iodine contrast (dye).  We must know if you could possible be pregnant.  Possible side effects:  Bleeding from needle site Infection (rare, may require surgery) Nerve injury (rare) Numbness & tingling (temporary) A brief  convulsion or seizure Light-headedness (temporary) Pain at injection site (several days) Decreased blood pressure (temporary) Weakness in the leg (temporary)   Call if you experience:  New onset weakness or numbness of an extremity below the injection site that last more than 8 hours. Hives or difficulty breathing ( go to the emergency room) Inflammation or drainage at the injection site Any new symptoms which are concerning to you  Please note:  Although the local anesthetic injected can often make your back/ hip/ buttock/ leg feel good for several hours after the injections, the pain will likely return.  It takes 3-7 days for steroids to work in the sacroiliac area.  You may not notice any pain relief for at least that one week.  If effective, we will often do a series of three injections spaced 3-6 weeks apart to maximally decrease your pain.  After the initial series, we generally will wait some months before a repeat injection of the same type.  If you have any questions, please call [phone] [facility]

## 2022-10-29 DIAGNOSIS — H5203 Hypermetropia, bilateral: Secondary | ICD-10-CM | POA: Diagnosis not present

## 2022-10-31 ENCOUNTER — Ambulatory Visit: Payer: Medicare Other | Admitting: Orthopaedic Surgery

## 2022-11-04 DIAGNOSIS — K080 Exfoliation of teeth due to systemic causes: Secondary | ICD-10-CM | POA: Diagnosis not present

## 2022-11-04 NOTE — Progress Notes (Unsigned)
Jasmine Morrow, NP-C Phone: [phone]  SHELLEEN CAHAN is a 67 y.o. female who presents today for transfer of care. She has no complaints or concerns today. She is followed by Endocrinology for hypothyroidism and osteoporosis. She is requesting a referral for a new therapist.   HYPERTENSION Disease Monitoring Home BP Monitoring- 100s/70s Chest pain- No    Dyspnea- No Medications Compliance-  Norvasc 5 mg. Lightheadedness-  No  Edema- No BMET    Component Value Date/Time   NA 140 05/26/2022 2357   K 3.9 05/26/2022 2357   CL 109 05/26/2022 2357   CO2 24 05/26/2022 2357   GLUCOSE 109 (H) 05/26/2022 2357   BUN 28 (H) 05/26/2022 2357   CREATININE 0.72 05/26/2022 2357   CALCIUM 9.5 05/26/2022 2357   GFRNONAA >60 05/26/2022 2357   GFRAA >60 10/04/2019 1524   GERD:   Reflux symptoms: Heartburn, dysphagia, and chest tightness   Abd pain: No   Blood in stool: No  Dysphagia: Yes   EGD: Scheduled for 11/11/2022- seeing GI  Medication: Pepcid and Prilosec  HYPOTHYROIDISM Disease Monitoring Weight changes: No  Skin Changes: No Palpitations: No Heat/Cold intolerance: No Medication Monitoring Compliance:  Levothyroxine   Last TSH:   Lab Results  Component Value Date   TSH 1.69 11/27/2021   Depression- Patient reports Hx of depression and anxiety. She previously saw a therapist and is requesting a referral for a new one. She is not on any medications currently. Denies SI/HI. Reports she does have dark moments at times.    Social History   Tobacco Use  Smoking Status Never  Smokeless Tobacco Never    Current Outpatient Medications on File Prior to Visit  Medication Sig Dispense Refill   amLODipine (NORVASC) 5 MG tablet Take 1 tablet (5 mg total) by mouth daily. D/c 2.5 mg dose. BP goal <130/<80 90 tablet 3   Aspirin 81 MG CAPS Take by mouth daily at 8 pm.     atorvastatin (LIPITOR) 10 MG tablet Take 10 mg by mouth daily.     famotidine (PEPCID) 20 MG tablet Take 20 mg by mouth  at bedtime.     levothyroxine (SYNTHROID) 175 MCG tablet Take 1 tablet (175 mcg total) by mouth daily before breakfast. 30 min before food 90 tablet 3   omeprazole (PRILOSEC) 20 MG capsule Take 1 capsule (20 mg total) by mouth in the morning. 30 capsule 5   polyethylene glycol powder (GLYCOLAX/MIRALAX) 17 GM/SCOOP powder Take 34 g by mouth as needed.     triamterene-hydrochlorothiazide (MAXZIDE-25) 37.5-25 MG tablet Take 0.5 tablets by mouth daily. Cut pill in 1/2 90 tablet 1   CALCIUM CITRATE PO Take 600 mg by mouth daily. (Patient not taking: Reported on 11/05/2022)     [DISCONTINUED] traZODone (DESYREL) 50 MG tablet Take 0.5-1 tablets (25-50 mg total) by mouth at bedtime as needed for sleep. 90 tablet 3   No current facility-administered medications on file prior to visit.    ROS see history of present illness  Objective  Physical Exam Vitals:   11/05/22 1359  BP: 100/70  Pulse: 82  Temp: 98.2 F (36.8 C)  SpO2: 98%    BP Readings from Last 3 Encounters:  11/05/22 100/70  10/28/22 125/89  09/10/22 120/80   Wt Readings from Last 3 Encounters:  11/05/22 170 lb (77.1 kg)  10/28/22 168 lb (76.2 kg)  09/10/22 169 lb 6.4 oz (76.8 kg)    Physical Exam Constitutional:      General: She  is not in acute distress.    Appearance: Normal appearance.  HENT:     Head: Normocephalic.  Cardiovascular:     Rate and Rhythm: Normal rate and regular rhythm.     Heart sounds: Normal heart sounds.  Pulmonary:     Effort: Pulmonary effort is normal.     Breath sounds: Normal breath sounds.  Skin:    General: Skin is warm and dry.  Neurological:     General: No focal deficit present.     Mental Status: She is alert.  Psychiatric:        Mood and Affect: Mood normal.        Behavior: Behavior normal.    Assessment/Plan: Please see individual problem list.  Episode of recurrent major depressive disorder, unspecified depression episode severity ([place]) Assessment & Plan: Patient  requesting referral for new therapist/psychiatrist. She is not currently on any medications. Denies SI/HI. Will refer to Psychiatry.   Orders: -     Ambulatory referral to Psychiatry  Essential hypertension Assessment & Plan: Chronic. Stable on Norvasc 5 mg daily. Continue.    Gastroesophageal reflux disease, unspecified whether esophagitis present Assessment & Plan: Chronic. Currently on Pepcid and Prilosec. Continue. Seeing GI for dysphagia, EGD scheduled for 11/11/2022. Will monitor.    Hypothyroidism, unspecified type Assessment & Plan: Chronic. Stable on Levothyroxine 175 mcg daily. Continue. Last TSH- 1.36 on 10/27/2022. Follow up with Endocrinology- last office visit 10/27/2022, note reviewed.   Osteoporosis, unspecified osteoporosis type, unspecified pathological fracture presence Assessment & Plan: Having infusions with Endocrinology- last office visit on 10/27/2022, note reviewed. Encouraged to continue daily OTC vitamin D and calcium. Advised daily weightbearing exercises. DEXA due this year.    Screening mammogram for breast cancer -     3D Screening Mammogram, Left and Right; Future   Return in about 6 months (around 05/06/2023) for Annual Exam.   Jasmine Morrow, NP-C [place]

## 2022-11-05 ENCOUNTER — Ambulatory Visit (INDEPENDENT_AMBULATORY_CARE_PROVIDER_SITE_OTHER): Payer: Medicare Other | Admitting: Nurse Practitioner

## 2022-11-05 ENCOUNTER — Encounter: Payer: Self-pay | Admitting: Nurse Practitioner

## 2022-11-05 VITALS — BP 100/70 | HR 82 | Temp 98.2°F | Ht 65.0 in | Wt 170.0 lb

## 2022-11-05 DIAGNOSIS — E039 Hypothyroidism, unspecified: Secondary | ICD-10-CM | POA: Diagnosis not present

## 2022-11-05 DIAGNOSIS — Z1231 Encounter for screening mammogram for malignant neoplasm of breast: Secondary | ICD-10-CM

## 2022-11-05 DIAGNOSIS — F339 Major depressive disorder, recurrent, unspecified: Secondary | ICD-10-CM

## 2022-11-05 DIAGNOSIS — K219 Gastro-esophageal reflux disease without esophagitis: Secondary | ICD-10-CM

## 2022-11-05 DIAGNOSIS — I10 Essential (primary) hypertension: Secondary | ICD-10-CM | POA: Diagnosis not present

## 2022-11-05 DIAGNOSIS — M810 Age-related osteoporosis without current pathological fracture: Secondary | ICD-10-CM

## 2022-11-05 NOTE — Assessment & Plan Note (Signed)
Chronic. Stable on Norvasc 5 mg daily. Continue.

## 2022-11-05 NOTE — Assessment & Plan Note (Addendum)
Having infusions with Endocrinology- last office visit on 10/27/2022, note reviewed. Encouraged to continue daily OTC vitamin D and calcium. Advised daily weightbearing exercises. DEXA due this year.

## 2022-11-05 NOTE — Assessment & Plan Note (Signed)
Patient requesting referral for new therapist/psychiatrist. She is not currently on any medications. Denies SI/HI. Will refer to Psychiatry.

## 2022-11-05 NOTE — Assessment & Plan Note (Addendum)
Chronic. Currently on Pepcid and Prilosec. Continue. Seeing GI for dysphagia, EGD scheduled for 11/11/2022. Will monitor.

## 2022-11-05 NOTE — Patient Instructions (Addendum)
YOUR MAMMOGRAM IS DUE, PLEASE CALL AND GET THIS SCHEDULED! [place] - call [phone]   It was nice to meet you!  Please schedule your mammogram.   I sent a referral to Psychiatry for you, they will contact you to schedule.

## 2022-11-05 NOTE — Assessment & Plan Note (Addendum)
Chronic. Stable on Levothyroxine 175 mcg daily. Continue. Last TSH- 1.36 on 10/27/2022. Follow up with Endocrinology- last office visit 10/27/2022, note reviewed.

## 2022-11-10 ENCOUNTER — Telehealth: Payer: Self-pay | Admitting: Internal Medicine

## 2022-11-10 NOTE — Telephone Encounter (Signed)
Pt c/o of Chest Pain: STAT if CP now or developed within 24 hours  1. Are you having CP right now? No. Patient had chest pain 02/13 & 11/07/00/17.  2. Are you experiencing any other symptoms (ex. SOB, nausea, vomiting, sweating)?  She states that besides the constant pressure on left side there was some difficulty swallowing.   3. How long have you been experiencing CP? Just these days listed.   4. Is your CP continuous or coming and going? Coming and going.   5. Have you taken Nitroglycerin? No.  ?

## 2022-11-10 NOTE — Telephone Encounter (Signed)
Pt called to report she experienced two episodes of chest pain on Tuesday and Saturday that she described as pressure/tightness. She report on Tuesday she took tums and symptoms resolved. On Saturday she reported symptoms started at 9 am and didn't resolve until 5 am the next morning.  Pt stated she took tums and ASA with no relief.  She reported EMS was contacted and told everything was fine, so pt stated she opted to not go to ER.  Pt denies symptoms currently Appointment scheduled for 2/23 for further evaluations Pt made aware of ED precaution should any new symptoms develop or worsen.

## 2022-11-11 ENCOUNTER — Ambulatory Visit: Payer: Medicare Other | Admitting: Anesthesiology

## 2022-11-11 ENCOUNTER — Encounter: Payer: Self-pay | Admitting: Gastroenterology

## 2022-11-11 ENCOUNTER — Ambulatory Visit
Admission: RE | Admit: 2022-11-11 | Discharge: 2022-11-11 | Disposition: A | Discharge status: Home / Self Care | Payer: Medicare Other | Attending: Gastroenterology | Admitting: Gastroenterology

## 2022-11-11 ENCOUNTER — Other Ambulatory Visit: Payer: Self-pay

## 2022-11-11 ENCOUNTER — Encounter
Admission: RE | Disposition: A | Discharge status: Home / Self Care | Payer: Self-pay | Source: Home / Self Care | Attending: Gastroenterology

## 2022-11-11 DIAGNOSIS — R1310 Dysphagia, unspecified: Secondary | ICD-10-CM | POA: Diagnosis not present

## 2022-11-11 DIAGNOSIS — K219 Gastro-esophageal reflux disease without esophagitis: Secondary | ICD-10-CM | POA: Diagnosis not present

## 2022-11-11 DIAGNOSIS — E039 Hypothyroidism, unspecified: Secondary | ICD-10-CM | POA: Diagnosis not present

## 2022-11-11 DIAGNOSIS — Z8673 Personal history of transient ischemic attack (TIA), and cerebral infarction without residual deficits: Secondary | ICD-10-CM | POA: Diagnosis not present

## 2022-11-11 DIAGNOSIS — I10 Essential (primary) hypertension: Secondary | ICD-10-CM | POA: Diagnosis not present

## 2022-11-11 DIAGNOSIS — J45909 Unspecified asthma, uncomplicated: Secondary | ICD-10-CM | POA: Diagnosis not present

## 2022-11-11 HISTORY — PX: ESOPHAGOGASTRODUODENOSCOPY (EGD) WITH PROPOFOL: SHX5813

## 2022-11-11 SURGERY — ESOPHAGOGASTRODUODENOSCOPY (EGD) WITH PROPOFOL
Anesthesia: General

## 2022-11-11 MED ORDER — SODIUM CHLORIDE 0.9 % IV SOLN: INTRAVENOUS | Status: DC

## 2022-11-11 MED ORDER — LIDOCAINE HCL (CARDIAC) PF 100 MG/5ML IV SOSY
PREFILLED_SYRINGE | INTRAVENOUS | Status: DC | PRN
  Administered 2022-11-11: 50 mg via INTRAVENOUS

## 2022-11-11 MED ORDER — PROPOFOL 1000 MG/100ML IV EMUL
INTRAVENOUS | Status: AC
  Filled 2022-11-11: qty 100

## 2022-11-11 MED ORDER — DEXMEDETOMIDINE HCL IN NACL 80 MCG/20ML IV SOLN
INTRAVENOUS | Status: DC | PRN
  Administered 2022-11-11: 4 ug via BUCCAL
  Administered 2022-11-11: 8 ug via BUCCAL

## 2022-11-11 MED ORDER — OMEPRAZOLE 40 MG PO CPDR: 40.0000 mg | DELAYED_RELEASE_CAPSULE | Freq: Every day | ORAL | 0 refills | Status: DC

## 2022-11-11 MED ORDER — PROPOFOL 10 MG/ML IV BOLUS
INTRAVENOUS | Status: DC | PRN
  Administered 2022-11-11: 50 mg via INTRAVENOUS
  Administered 2022-11-11: 30 mg via INTRAVENOUS

## 2022-11-11 MED ORDER — LIDOCAINE HCL (PF) 2 % IJ SOLN
INTRAMUSCULAR | Status: AC
  Filled 2022-11-11: qty 5

## 2022-11-11 NOTE — Anesthesia Postprocedure Evaluation (Signed)
Anesthesia Post Note  Patient: [name]  Procedure(s) Performed: ESOPHAGOGASTRODUODENOSCOPY (EGD) WITH PROPOFOL  Patient location during evaluation: Endoscopy Anesthesia Type: General Level of consciousness: awake and alert Pain management: pain level controlled Vital Signs Assessment: post-procedure vital signs reviewed and stable Respiratory status: spontaneous breathing, nonlabored ventilation, respiratory function stable and patient connected to nasal cannula oxygen Cardiovascular status: blood pressure returned to baseline and stable Postop Assessment: no apparent nausea or vomiting Anesthetic complications: no   No notable events documented.   Last Vitals:  Vitals:   11/11/22 0747 11/11/22 0823  BP: 115/86 107/69  Pulse: 71 68  Resp: 18 14  Temp: (!) 35.9 C (!) 35.9 C  SpO2: 100% 100%    Last Pain:  Vitals:   11/11/22 0823  TempSrc: Temporal  PainSc: Asleep                 Arita Miss
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
## 2022-11-11 NOTE — Op Note (Signed)
[facility] Gastroenterology Patient Name: [name] Procedure Date: 11/11/2022 7:30 AM MRN: [id number] Account #: [id number] Date of Birth: July 03, 1956 Admit Type: Outpatient Age: 67 Room: [facility] ENDO ROOM 3 Gender: Female Note Status: Finalized Instrument Name: Upper Endoscope [id number] Procedure:             Upper GI endoscopy Indications:           Dysphagia Providers:             Lin Landsman MD, MD Medicines:             General Anesthesia Complications:         No immediate complications. Estimated blood loss: None. Procedure:             Pre-Anesthesia Assessment:                        - Prior to the procedure, a History and Physical was                         performed, and patient medications and allergies were                         reviewed. The patient is competent. The risks and                         benefits of the procedure and the sedation options and                         risks were discussed with the patient. All questions                         were answered and informed consent was obtained.                         Patient identification and proposed procedure were                         verified by the physician, the nurse, the                         anesthesiologist, the anesthetist and the technician                         in the pre-procedure area in the procedure room in the                         endoscopy suite. Mental Status Examination: alert and                         oriented. Airway Examination: normal oropharyngeal                         airway and neck mobility. Respiratory Examination:                         clear to auscultation. CV Examination: normal.                         Prophylactic Antibiotics: The patient  does not require                         prophylactic antibiotics. Prior Anticoagulants: The                         patient has taken no anticoagulant or antiplatelet                         agents.  ASA Grade Assessment: III - A patient with                         severe systemic disease. After reviewing the risks and                         benefits, the patient was deemed in satisfactory                         condition to undergo the procedure. The anesthesia                         plan was to use general anesthesia. Immediately prior                         to administration of medications, the patient was                         re-assessed for adequacy to receive sedatives. The                         heart rate, respiratory rate, oxygen saturations,                         blood pressure, adequacy of pulmonary ventilation, and                         response to care were monitored throughout the                         procedure. The physical status of the patient was                         re-assessed after the procedure.                        After obtaining informed consent, the endoscope was                         passed under direct vision. Throughout the procedure,                         the patient's blood pressure, pulse, and oxygen                         saturations were monitored continuously. The Endoscope                         was introduced through the mouth, and advanced to the  second part of duodenum. The upper GI endoscopy was                         accomplished without difficulty. The patient tolerated                         the procedure well. Findings:      The duodenal bulb and second portion of the duodenum were normal.      The entire examined stomach was normal.      The cardia and gastric fundus were normal on retroflexion.      Esophagogastric landmarks were identified: the gastroesophageal junction       was found at 40 cm from the incisors.      The gastroesophageal junction and examined esophagus were normal. Impression:            - Normal duodenal bulb and second portion of the                          duodenum.                        - Normal stomach.                        - Esophagogastric landmarks identified.                        - Normal gastroesophageal junction and esophagus.                        - No specimens collected. Recommendation:        - Discharge patient to home (with escort).                        - Resume previous diet today.                        - Continue present medications.                        - Chew bread and meats thoroughly before you swallow Procedure Code(s):     --- Professional ---                        [phone], Esophagogastroduodenoscopy, flexible,                         transoral; diagnostic, including collection of                         specimen(s) by brushing or washing, when performed                         (separate procedure) Diagnosis Code(s):     --- Professional ---                        R13.10, Dysphagia, unspecified CPT copyright 2022 American Medical Association. All rights reserved. The codes documented in this report are preliminary and upon coder review may  be revised to meet current compliance requirements. Dr. Ulyess Mort Lin Landsman MD, MD 11/11/2022  8:23:31 AM This report has been signed electronically. Number of Addenda: 0 Note Initiated On: 11/11/2022 7:30 AM Estimated Blood Loss:  Estimated blood loss: none.      [facility]

## 2022-11-11 NOTE — Transfer of Care (Signed)
Immediate Anesthesia Transfer of Care Note  Patient: [name]  Procedure(s) Performed: ESOPHAGOGASTRODUODENOSCOPY (EGD) WITH PROPOFOL  Patient Location: PACU and Endoscopy Unit  Anesthesia Type:General  Level of Consciousness: drowsy and patient cooperative  Airway & Oxygen Therapy: Patient Spontanous Breathing  Post-op Assessment: Report given to RN and Post -op Vital signs reviewed and stable  Post vital signs: Reviewed and stable  Last Vitals:  Vitals Value Taken Time  BP 107/69 11/11/22 0823  Temp    Pulse 67 11/11/22 0823  Resp 18 11/11/22 0823  SpO2 100 % 11/11/22 0823  Vitals shown include unvalidated device data.  Last Pain:  Vitals:   11/11/22 0747  TempSrc: Temporal  PainSc: 0-No pain         Complications: No notable events documented.

## 2022-11-11 NOTE — Anesthesia Procedure Notes (Signed)
Procedure Name: MAC Date/Time: 11/11/2022 8:12 AM  Performed by: Jerrye Noble, CRNAPre-anesthesia Checklist: Patient identified, Emergency Drugs available, Suction available and Patient being monitored Patient Re-evaluated:Patient Re-evaluated prior to induction Oxygen Delivery Method: Nasal cannula

## 2022-11-12 ENCOUNTER — Encounter: Payer: Self-pay | Admitting: Gastroenterology

## 2022-11-12 ENCOUNTER — Ambulatory Visit
Admission: RE | Admit: 2022-11-12 | Discharge: 2022-11-12 | Disposition: A | Discharge status: Home / Self Care | Payer: Medicare Other | Source: Ambulatory Visit | Attending: Student in an Organized Health Care Education/Training Program | Admitting: Student in an Organized Health Care Education/Training Program

## 2022-11-12 ENCOUNTER — Ambulatory Visit
Payer: Medicare Other | Attending: Student in an Organized Health Care Education/Training Program | Admitting: Student in an Organized Health Care Education/Training Program

## 2022-11-12 VITALS — BP 111/84 | HR 73 | Temp 97.3°F | Resp 18 | Ht 65.0 in | Wt 167.0 lb

## 2022-11-12 DIAGNOSIS — G5701 Lesion of sciatic nerve, right lower limb: Secondary | ICD-10-CM

## 2022-11-12 DIAGNOSIS — M47818 Spondylosis without myelopathy or radiculopathy, sacral and sacrococcygeal region: Secondary | ICD-10-CM

## 2022-11-12 DIAGNOSIS — M533 Sacrococcygeal disorders, not elsewhere classified: Secondary | ICD-10-CM

## 2022-11-12 DIAGNOSIS — M4722 Other spondylosis with radiculopathy, cervical region: Secondary | ICD-10-CM | POA: Diagnosis not present

## 2022-11-12 DIAGNOSIS — G8929 Other chronic pain: Secondary | ICD-10-CM

## 2022-11-12 DIAGNOSIS — G894 Chronic pain syndrome: Secondary | ICD-10-CM | POA: Diagnosis not present

## 2022-11-12 DIAGNOSIS — Q761 Klippel-Feil syndrome: Secondary | ICD-10-CM | POA: Insufficient documentation

## 2022-11-12 DIAGNOSIS — M2550 Pain in unspecified joint: Secondary | ICD-10-CM | POA: Insufficient documentation

## 2022-11-12 DIAGNOSIS — M5412 Radiculopathy, cervical region: Secondary | ICD-10-CM

## 2022-11-12 MED ORDER — LIDOCAINE HCL 2 % IJ SOLN
20.0000 mL | Freq: Once | INTRAMUSCULAR | Status: AC
  Administered 2022-11-12: 400 mg

## 2022-11-12 MED ORDER — DEXAMETHASONE SODIUM PHOSPHATE 10 MG/ML IJ SOLN
INTRAMUSCULAR | Status: AC
  Filled 2022-11-12: qty 1

## 2022-11-12 MED ORDER — IOHEXOL 180 MG/ML  SOLN
10.0000 mL | Freq: Once | INTRAMUSCULAR | Status: AC
  Administered 2022-11-12: 10 mL via INTRA_ARTICULAR

## 2022-11-12 MED ORDER — DEXAMETHASONE SODIUM PHOSPHATE 10 MG/ML IJ SOLN
10.0000 mg | Freq: Once | INTRAMUSCULAR | Status: AC
  Administered 2022-11-12: 10 mg

## 2022-11-12 MED ORDER — METHYLPREDNISOLONE ACETATE 40 MG/ML IJ SUSP
INTRAMUSCULAR | Status: AC
  Filled 2022-11-12: qty 1

## 2022-11-12 MED ORDER — LIDOCAINE HCL 2 % IJ SOLN
INTRAMUSCULAR | Status: AC
  Filled 2022-11-12: qty 20

## 2022-11-12 MED ORDER — IOHEXOL 180 MG/ML  SOLN
INTRAMUSCULAR | Status: AC
  Filled 2022-11-12: qty 20

## 2022-11-12 MED ORDER — ROPIVACAINE HCL 2 MG/ML IJ SOLN
9.0000 mL | Freq: Once | INTRAMUSCULAR | Status: AC
  Administered 2022-11-12: 9 mL via PERINEURAL

## 2022-11-12 MED ORDER — ROPIVACAINE HCL 2 MG/ML IJ SOLN
INTRAMUSCULAR | Status: AC
  Filled 2022-11-12: qty 20

## 2022-11-12 MED ORDER — METHYLPREDNISOLONE ACETATE 40 MG/ML IJ SUSP
40.0000 mg | Freq: Once | INTRAMUSCULAR | Status: AC
  Administered 2022-11-12: 40 mg via INTRA_ARTICULAR

## 2022-11-12 NOTE — Patient Instructions (Signed)

## 2022-11-12 NOTE — Progress Notes (Signed)
Safety precautions to be maintained throughout the outpatient stay will include: orient to surroundings, keep bed in low position, maintain call bell within reach at all times, provide assistance with transfer out of bed and ambulation.  

## 2022-11-12 NOTE — Progress Notes (Signed)
PROVIDER NOTE: Information contained herein reflects review and annotations entered in association with encounter. Interpretation of such information and data should be left to medically-trained personnel. Information provided to patient can be located elsewhere in the medical record under "Patient Instructions". Document created using STT-dictation technology, any transcriptional errors that may result from process are unintentional.    Patient: [name]  Service Category: Procedure  Provider: Gillis Santa, MD  DOB: [birth year]  DOS: 11/12/2022  Location: [facility]  MRN: [id number]  Setting: Ambulatory - outpatient  Referring Provider: Gillis Santa, MD  Type: Established Patient  Specialty: Interventional Pain Management  PCP: Tomasita Morrow, NP   Primary Reason for Visit: Interventional Pain Management Treatment. CC: Back Pain   Procedure:          Anesthesia, Analgesia, Anxiolysis:  Type: Therapeutic Sacroiliac Joint Steroid Injection #3 (#3 09/02/21)  & Right Piriformis Trigger point injection  under fluoroscopy #2  Region: Inferior Lumbosacral Region Level: PIIS (Posterior Inferior Iliac Spine) Laterality: Right-Side  Type: Local Anesthesia  Local Anesthetic: Lidocaine 1-2%  Position: Prone           Indications: 1. SI joint arthritis   2. Chronic right SI joint pain   3. Chronic pain syndrome   4. Cervical fusion syndrome   5. Cervical radicular pain    Pain Score: Pre-procedure: 8 /10 Post-procedure: 0-No pain/10   Pre-op H&P Assessment:  [name] is a 66 y.o. (year old), female patient, seen today for interventional treatment. She  has a past surgical history that includes Back surgery; Breast surgery; Augmentation mammaplasty (Bilateral, 2006); Esophagogastroduodenoscopy (egd) with propofol (N/A, 10/24/2020); Esophagogastroduodenoscopy (egd) with propofol (N/A, 12/20/2020); Esophageal manometry (N/A, 03/04/2021); 24 hour ph study (N/A, 03/04/2021); PH  impedance study (N/A, 03/04/2021); Vein Surgery (1991); Upper gastrointestinal endoscopy; Colonoscopy; Anterior cervical decomp/discectomy fusion (N/A, 05/12/2022); and Esophagogastroduodenoscopy (egd) with propofol (N/A, 11/11/2022). [name] has a current medication list which includes the following prescription(s): amlodipine, aspirin, atorvastatin, famotidine, levothyroxine, omeprazole, polyethylene glycol powder, triamterene-hydrochlorothiazide, calcium citrate, and [DISCONTINUED] trazodone. Her primarily concern today is the Back Pain   Initial Vital Signs:  Pulse/HCG Rate: 73ECG Heart Rate: 73 Temp: (!) 97.3 F (36.3 C) Resp: 16 BP: 119/74 SpO2: 100 %  BMI: Estimated body mass index is 27.79 kg/m as calculated from the following:   Height as of this encounter: 5' 5"$  (1.651 m).   Weight as of this encounter: 167 lb (75.8 kg).  Risk Assessment: Allergies: Reviewed. She has No Known Allergies.  Allergy Precautions: None required Coagulopathies: Reviewed. None identified.  Blood-thinner therapy: None at this time Active Infection(s): Reviewed. None identified. [name] is afebrile  Site Confirmation: [name] was asked to confirm the procedure and laterality before marking the site Procedure checklist: Completed Consent: Before the procedure and under the influence of no sedative(s), amnesic(s), or anxiolytics, the patient was informed of the treatment options, risks and possible complications. To fulfill our ethical and legal obligations, as recommended by the American Medical Association's Code of Ethics, I have informed the patient of my clinical impression; the nature and purpose of the treatment or procedure; the risks, benefits, and possible complications of the intervention; the alternatives, including doing nothing; the risk(s) and benefit(s) of the alternative treatment(s) or procedure(s); and the risk(s) and benefit(s) of doing nothing. The patient was provided information  about the general risks and possible complications associated with the procedure. These may include, but are not limited to: failure to achieve desired goals, infection, bleeding,  organ or nerve damage, allergic reactions, paralysis, and death. In addition, the patient was informed of those risks and complications associated to the procedure, such as failure to decrease pain; infection; bleeding; organ or nerve damage with subsequent damage to sensory, motor, and/or autonomic systems, resulting in permanent pain, numbness, and/or weakness of one or several areas of the body; allergic reactions; (i.e.: anaphylactic reaction); and/or death. Furthermore, the patient was informed of those risks and complications associated with the medications. These include, but are not limited to: allergic reactions (i.e.: anaphylactic or anaphylactoid reaction(s)); adrenal axis suppression; blood sugar elevation that in diabetics may result in ketoacidosis or comma; water retention that in patients with history of congestive heart failure may result in shortness of breath, pulmonary edema, and decompensation with resultant heart failure; weight gain; swelling or edema; medication-induced neural toxicity; particulate matter embolism and blood vessel occlusion with resultant organ, and/or nervous system infarction; and/or aseptic necrosis of one or more joints. Finally, the patient was informed that Medicine is not an exact science; therefore, there is also the possibility of unforeseen or unpredictable risks and/or possible complications that may result in a catastrophic outcome. The patient indicated having understood very clearly. We have given the patient no guarantees and we have made no promises. Enough time was given to the patient to ask questions, all of which were answered to the patient's satisfaction. [name] has indicated that she wanted to continue with the procedure. Attestation: I, the ordering provider, attest  that I have discussed with the patient the benefits, risks, side-effects, alternatives, likelihood of achieving goals, and potential problems during recovery for the procedure that I have provided informed consent. Date  Time: 11/12/2022 12:47 PM  Pre-Procedure Preparation:  Monitoring: As per clinic protocol. Respiration, ETCO2, SpO2, BP, heart rate and rhythm monitor placed and checked for adequate function Safety Precautions: Patient was assessed for positional comfort and pressure points before starting the procedure. Time-out: I initiated and conducted the "Time-out" before starting the procedure, as per protocol. The patient was asked to participate by confirming the accuracy of the "Time Out" information. Verification of the correct person, site, and procedure were performed and confirmed by me, the nursing staff, and the patient. "Time-out" conducted as per Joint Commission's Universal Protocol (UP.01.01.01). Time: 1327  Description of Procedure:          Target Area: Superior, posterior, aspect of the sacroiliac fissure Approach: Posterior, paraspinal, ipsilateral approach. Area Prepped: Entire Lower Lumbosacral Region DuraPrep (Iodine Povacrylex [0.7% available iodine] and Isopropyl Alcohol, 74% w/w) Safety Precautions: Aspiration looking for blood return was conducted prior to all injections. At no point did we inject any substances, as a needle was being advanced. No attempts were made at seeking any paresthesias. Safe injection practices and needle disposal techniques used. Medications properly checked for expiration dates. SDV (single dose vial) medications used. Description of the Procedure: Protocol guidelines were followed. The patient was placed in position over the procedure table. The target area was identified and the area prepped in the usual manner. Skin & deeper tissues infiltrated with local anesthetic. Appropriate amount of time allowed to pass for local anesthetics to take  effect. The procedure needle was advanced under fluoroscopic guidance into the sacroiliac joint until a firm endpoint was obtained. Proper needle placement secured. Negative aspiration confirmed. Solution injected in intermittent fashion, asking for systemic symptoms every 0.5cc of injectate. The needles were then removed and the area cleansed, making sure to leave some of the prepping solution back  to take advantage of its long term bactericidal properties. Vitals:   11/12/22 1323 11/12/22 1328 11/12/22 1333 11/12/22 1337  BP: 127/79 (!) 125/90 94/76 111/84  Pulse:      Resp: (!) 21 19 18   $ Temp:      SpO2: 97% 99% 99%   Weight:      Height:        Start Time: 1327 hrs. End Time: 1332 hrs. Materials:  Needle(s) Type: Spinal Needle Gauge: 25G Length: 3.5-in Medication(s): Please see orders for medications and dosing details. 5 cc solution made of 4 cc of 0.2% ropivacaine, 1 cc of methylprednisolone, 40 mg/cc.  Injected into the right SI joint after contrast confirmation.  A right piriformis trigger point injection was also performed under fluoroscopy.  1 cm deep, 1 cm inferior and 1 cm lateral to the inferior SI joint fissure, the piriformis muscle was injected with contrast, muscle striations confirmed, no pain or paresthesias down right lower extremity.  5 cc solution made of 4 cc of 0.2% ropivacaine, 1 cc of Decadron:,10 mg/cc was injected into the right piriformis muscle.  Imaging Guidance (Non-Spinal):          Type of Imaging Technique: Fluoroscopy Guidance (Non-Spinal) Indication(s): Assistance in needle guidance and placement for procedures requiring needle placement in or near specific anatomical locations not easily accessible without such assistance. Exposure Time: Please see nurses notes. Contrast: Before injecting any contrast, we confirmed that the patient did not have an allergy to iodine, shellfish, or radiological contrast. Once satisfactory needle placement was completed  at the desired level, radiological contrast was injected. Contrast injected under live fluoroscopy. No contrast complications. See chart for type and volume of contrast used. Fluoroscopic Guidance: I was personally present during the use of fluoroscopy. "Tunnel Vision Technique" used to obtain the best possible view of the target area. Parallax error corrected before commencing the procedure. "Direction-depth-direction" technique used to introduce the needle under continuous pulsed fluoroscopy. Once target was reached, antero-posterior, oblique, and lateral fluoroscopic projection used confirm needle placement in all planes. Images permanently stored in EMR. Interpretation: I personally interpreted the imaging intraoperatively. Adequate needle placement confirmed in multiple planes. Appropriate spread of contrast into desired area was observed. No evidence of afferent or efferent intravascular uptake. Permanent images saved into the patient's record.   Post-operative Assessment:  Post-procedure Vital Signs:  Pulse/HCG Rate: 7367 Temp: (!) 97.3 F (36.3 C) Resp: 18 BP: 111/84 SpO2: 99 %  EBL: None  Complications: No immediate post-treatment complications observed by team, or reported by patient.  Note: The patient tolerated the entire procedure well. A repeat set of vitals were taken after the procedure and the patient was kept under observation following institutional policy, for this type of procedure. Post-procedural neurological assessment was performed, showing return to baseline, prior to discharge. The patient was provided with post-procedure discharge instructions, including a section on how to identify potential problems. Should any problems arise concerning this procedure, the patient was given instructions to immediately contact us, at any time, without hesitation. In any case, we plan to contact the patient by telephone for a follow-up status report regarding this interventional  procedure.  Comments:  No additional relevant information.  Plan of Care   Narrative & Impression  CLINICAL DATA:  Cervical pain.   EXAM: CERVICAL SPINE - COMPLETE 4+ VIEW   COMPARISON:  None Available.   FINDINGS: The patient is status post fusion from C4 through C7. Anterior plates, screws, and disc spacer devices are  in stable position. There is straightening of normal lordosis. No fractures. Osteophytes project into the right C6-7 neural foramen on oblique imaging. Osteophytes project into the left C6-7 neural foramina as well on oblique imaging. Uncovertebral degenerative changes are noted. No other significant abnormalities.   IMPRESSION: 1. The patient is status post fusion from C4 through C7. Hardware is in good position. 2. Osteophytes project into the right C6-7 and left C6-7 neural foramina on oblique imaging.  Patient endorses cervical spine pain with radiation into her right shoulder and down her right forearm and fingers.  She states that she is having difficulty opening bottles and tops.  She has a history of C4-C7 fusion.  Shoulder x-ray showed mild acromioclavicular degenerative disease.  Given that her pain seems more radicular in nature, we discussed a right cervical epidural steroid injection.  She states that she does exercises for her right neck and right arm but that the pain is not getting better.  Plan for right cervical epidural steroid injection.   Orders:  Orders Placed This Encounter  Procedures   Cervical Epidural Injection    Sedation: Patient's choice. Purpose: Diagnostic/Therapeutic Indication(s): Radiculitis and cervicalgia associater with cervical degenerative disc disease.    Standing Status:   Future    Standing Expiration Date:   02/10/2023    Scheduling Instructions:     Procedure: Cervical Epidural Steroid Injection/Block     Level(s): T1-T2     Laterality: RIGHT     Timeframe: As soon as schedule allows    Order Specific Question:    Where will this procedure be performed?    Answer:   [facility]    Comments:   [place] [facility] 1-60 MIN NO REPORT    Intraoperative interpretation by procedural physician at [place].    Standing Status:   Standing    Number of Occurrences:   1    Order Specific Question:   Reason for exam:    Answer:   Assistance in needle guidance and placement for procedures requiring needle placement in or near specific anatomical locations not easily accessible without such assistance.      Medications ordered for procedure: Meds ordered this encounter  Medications   iohexol (OMNIPAQUE) 180 MG/ML injection 10 mL    Must be Myelogram-compatible. If not available, you may substitute with a water-soluble, non-ionic, hypoallergenic, myelogram-compatible radiological contrast medium.   lidocaine (XYLOCAINE) 2 % (with pres) injection 400 mg   dexamethasone (DECADRON) injection 10 mg   ropivacaine (PF) 2 mg/mL (0.2%) (NAROPIN) injection 9 mL   methylPREDNISolone acetate (DEPO-MEDROL) injection 40 mg    Medications administered: We administered iohexol, lidocaine, dexamethasone, ropivacaine (PF) 2 mg/mL (0.2%), and methylPREDNISolone acetate.  See the medical record for exact dosing, route, and time of administration.  Follow-up plan:   Return in about 3 weeks (around 12/03/2022) for Right C-ESI, in clinic NS.     Right SI-J and Right Piriformis 04/17/21, 09/02/21. 11/12/22    Recent Visits Date Type Provider Dept  10/28/22 Office Visit Gillis Santa, MD [facility]  Showing recent visits within past 90 days and meeting all other requirements Today's Visits Date Type Provider Dept  11/12/22 Procedure visit Gillis Santa, MD [facility]  Showing today's visits and meeting all other requirements Future Appointments No visits were found meeting these conditions. Showing future appointments within next 90 days and meeting all other  requirements  Disposition: Discharge home  Discharge (Date  Time):  11/12/2022; 1341 hrs.   Primary Care Physician: Tomasita Morrow, NP Location: [facility] Note by: Gillis Santa, MD Date: 11/12/2022; Time: 2:36 PM  Disclaimer:  Medicine is not an exact science. The only guarantee in medicine is that nothing is guaranteed. It is important to note that the decision to proceed with this intervention was based on the information collected from the patient. The Data and conclusions were drawn from the patient's questionnaire, the interview, and the physical examination. Because the information was provided in large part by the patient, it cannot be guaranteed that it has not been purposely or unconsciously manipulated. Every effort has been made to obtain as much relevant data as possible for this evaluation. It is important to note that the conclusions that lead to this procedure are derived in large part from the available data. Always take into account that the treatment will also be dependent on availability of resources and existing treatment guidelines, considered by other Pain Management Practitioners as being common knowledge and practice, at the time of the intervention. For Medico-Legal purposes, it is also important to point out that variation in procedural techniques and pharmacological choices are the acceptable norm. The indications, contraindications, technique, and results of the above procedure should only be interpreted and judged by a Board-Certified Interventional Pain Specialist with extensive familiarity and expertise in the same exact procedure and technique.

## 2022-11-13 ENCOUNTER — Telehealth: Payer: Self-pay | Admitting: *Deleted

## 2022-11-13 NOTE — Telephone Encounter (Signed)
No problems post procedure. 

## 2022-11-14 ENCOUNTER — Emergency Department: Payer: Medicare Other

## 2022-11-14 ENCOUNTER — Emergency Department
Admission: EM | Admit: 2022-11-14 | Discharge: 2022-11-14 | Disposition: A | Discharge status: Home / Self Care | Payer: Medicare Other | Attending: Emergency Medicine | Admitting: Emergency Medicine

## 2022-11-14 ENCOUNTER — Ambulatory Visit: Payer: Medicare Other | Attending: Internal Medicine | Admitting: Internal Medicine

## 2022-11-14 ENCOUNTER — Other Ambulatory Visit: Payer: Self-pay

## 2022-11-14 ENCOUNTER — Encounter: Payer: Self-pay | Admitting: Internal Medicine

## 2022-11-14 VITALS — BP 110/70 | HR 65 | Ht 65.0 in | Wt 168.0 lb

## 2022-11-14 DIAGNOSIS — R079 Chest pain, unspecified: Secondary | ICD-10-CM

## 2022-11-14 DIAGNOSIS — J9811 Atelectasis: Secondary | ICD-10-CM | POA: Diagnosis not present

## 2022-11-14 DIAGNOSIS — I10 Essential (primary) hypertension: Secondary | ICD-10-CM | POA: Insufficient documentation

## 2022-11-14 DIAGNOSIS — R0789 Other chest pain: Secondary | ICD-10-CM | POA: Diagnosis not present

## 2022-11-14 DIAGNOSIS — Z8673 Personal history of transient ischemic attack (TIA), and cerebral infarction without residual deficits: Secondary | ICD-10-CM

## 2022-11-14 LAB — TROPONIN I (HIGH SENSITIVITY)
Troponin I (High Sensitivity): <2 ng/L (ref <18)
Troponin I (High Sensitivity): 2 ng/L (ref <18)

## 2022-11-14 LAB — HEPATIC FUNCTION PANEL
ALT: 25 U/L (ref 0–44)
AST: 26 U/L (ref 15–41)
Albumin: 4.2 g/dL (ref 3.5–5.0)
Alkaline Phosphatase: 61 U/L (ref 38–126)
Bilirubin, Direct: <0.1 mg/dL (ref 0.0–0.2)
Total Bilirubin: 0.7 mg/dL (ref 0.3–1.2)
Total Protein: 7.4 g/dL (ref 6.5–8.1)

## 2022-11-14 LAB — CBC
HCT: 38.8 % (ref 36.0–46.0)
Hemoglobin: 12.7 g/dL (ref 12.0–15.0)
MCH: 28.4 pg (ref 26.0–34.0)
MCHC: 32.7 g/dL (ref 30.0–36.0)
MCV: 86.8 fL (ref 80.0–100.0)
Platelets: 238 10*3/uL (ref 150–400)
RBC: 4.47 MIL/uL (ref 3.87–5.11)
RDW: 13.1 % (ref 11.5–15.5)
WBC: 6.4 10*3/uL (ref 4.0–10.5)
nRBC: 0 % (ref 0.0–0.2)

## 2022-11-14 LAB — BASIC METABOLIC PANEL
Anion gap: 10 (ref 5–15)
BUN: 23 mg/dL (ref 8–23)
CO2: 25 mmol/L (ref 22–32)
Calcium: 9.3 mg/dL (ref 8.9–10.3)
Chloride: 102 mmol/L (ref 98–111)
Creatinine, Ser: 0.67 mg/dL (ref 0.44–1.00)
GFR, Estimated: >60 mL/min (ref >60)
Glucose, Bld: 92 mg/dL (ref 70–99)
Potassium: 3.8 mmol/L (ref 3.5–5.1)
Sodium: 137 mmol/L (ref 135–145)

## 2022-11-14 LAB — LIPASE, BLOOD: Lipase: 56 U/L — ABNORMAL HIGH (ref 11–51)

## 2022-11-14 MED ORDER — MORPHINE SULFATE (PF) 4 MG/ML IV SOLN
4.0000 mg | Freq: Once | INTRAVENOUS | Status: AC
  Administered 2022-11-14: 4 mg via INTRAVENOUS
  Filled 2022-11-14: qty 1

## 2022-11-14 MED ORDER — IOHEXOL 350 MG/ML SOLN
75.0000 mL | Freq: Once | INTRAVENOUS | Status: AC | PRN
  Administered 2022-11-14: 75 mL via INTRAVENOUS

## 2022-11-14 MED ORDER — PANTOPRAZOLE SODIUM 40 MG IV SOLR
40.0000 mg | Freq: Once | INTRAVENOUS | Status: AC
  Administered 2022-11-14: 40 mg via INTRAVENOUS
  Filled 2022-11-14: qty 10

## 2022-11-14 MED ORDER — ASPIRIN 325 MG PO TABS
325.0000 mg | ORAL_TABLET | Freq: Every day | ORAL | Status: DC
  Administered 2022-11-14: 325 mg via ORAL

## 2022-11-14 MED ORDER — ONDANSETRON HCL 4 MG/2ML IJ SOLN
4.0000 mg | Freq: Once | INTRAMUSCULAR | Status: AC
  Administered 2022-11-14: 4 mg via INTRAVENOUS
  Filled 2022-11-14: qty 2

## 2022-11-14 NOTE — Patient Instructions (Signed)
Patient sent to the Emergency Room

## 2022-11-14 NOTE — Progress Notes (Signed)
Follow-up Outpatient Visit Date: 11/14/2022  Primary Care Provider: Tomasita Morrow, NP [address] [facility] [address] [place] [address]  Chief Complaint: Chest pain  HPI:  [name] is a 67 y.o. female with history of incidentally noted lacunar stroke, hypertension, hypothyroidism, arthritis, and depression/anxiety, who presents for follow-up of lacunar stroke.  I met her in September after brain MRI showed a remote lacunar infarct and minimal chronic microvascular changes.  She was asymptomatic at the time of her visit other than can some continued weakness and decreased range of motion following ACDF.  We agreed to perform an echocardiogram and 14-day event monitor scratch that.  Event monitor showed rare PACs and PVCs as well as a few brief episodes of PSVT.  No atrial fibrillation/flutter was identified.  Echocardiogram showed normal LVEF with grade 1 diastolic dysfunction and mild left atrial enlargement.  No significant valvular disease was seen.  Bubble study was negative without evidence of interatrial shunting.  [name] presents today for evaluation of chest pain.  She had an episode of chest pain that began 5 days ago after eating some chicken wings.  The pain persisted for several hours despite taking Tums and aspirin, prompting her to summon EMS.  EMS evaluation was unremarkable; [name] declined transport to the ED.  She underwent upper endoscopy 2 days later, which was unremarkable.  [name] had been feeling well up until today when she had recurrent chest pain.  Previously, it was in the left side of the chest; today it is substernal without radiation.  She describes it as tightness that is more pronounced when she takes a deep breath.  She does not feel short of breath nor does she have any associated symptoms.  She rates her current pain 7/10 in intensity.  The pain over the weekend was even more severe, up to 9/10.  [name] reports being compliant with her medications and  states that her blood pressure has generally been well-controlled at home.  --------------------------------------------------------------------------------------------------  Cardiovascular History & Procedures: Cardiovascular Problems: Lacunar stroke Chest pain   Risk Factors: Stroke and hypertension   Cath/PCI: None   CV Surgery: None   EP Procedures and Devices: Event monitor (06/13/2022): Patient was monitored for 11 days, 23 hours.  Predominantly sinus rhythm with rare PACs and PVCs as well as a few brief episodes of PSVT lasting up to 8 beats.  No evidence of atrial fibrillation/flutter.   Non-Invasive Evaluation(s): TTE with bubble study (07/17/2022): Normal LV size with borderline LVH.  LVEF 60-65% with normal wall motion.  Grade 1 diastolic dysfunction.  Normal RV size and function.  Mild left atrial enlargement.  Mild mitral regurgitation.  Otherwise, no significant valvular abnormality.  CVP 3.  Bubble study negative for interatrial shunt.  Recent CV Pertinent Labs: Lab Results  Component Value Date   CHOL 189 10/30/2020   HDL 63.70 10/30/2020   LDLCALC 90 10/30/2020   TRIG 173.0 (H) 10/30/2020   CHOLHDL 3 10/30/2020   K 3.9 05/26/2022   BUN 28 (H) 05/26/2022   CREATININE 0.72 05/26/2022    Past medical and surgical history were reviewed and updated in EPIC.  Current Meds  Medication Sig   amLODipine (NORVASC) 5 MG tablet Take 1 tablet (5 mg total) by mouth daily. D/c 2.5 mg dose. BP goal <130/<80   Aspirin 81 MG CAPS Take by mouth daily at 8 pm.   atorvastatin (LIPITOR) 10 MG tablet Take 10 mg by mouth daily.   CALCIUM CITRATE  PO Take 600 mg by mouth daily.   famotidine (PEPCID) 20 MG tablet Take 20 mg by mouth at bedtime.   levothyroxine (SYNTHROID) 175 MCG tablet Take 1 tablet (175 mcg total) by mouth daily before breakfast. 30 min before food   omeprazole (PRILOSEC) 40 MG capsule Take 1 capsule (40 mg total) by mouth daily before breakfast.    polyethylene glycol powder (GLYCOLAX/MIRALAX) 17 GM/SCOOP powder Take 34 g by mouth as needed.   triamterene-hydrochlorothiazide (MAXZIDE-25) 37.5-25 MG tablet Take 0.5 tablets by mouth daily. Cut pill in 1/2    Allergies: Patient has no known allergies.  Social History   Tobacco Use   Smoking status: Never   Smokeless tobacco: Never  Vaping Use   Vaping Use: Never used  Substance Use Topics   Alcohol use: Yes    Alcohol/week: 1.0 standard drink of alcohol    Types: 1 Standard drinks or equivalent per week    Comment: socially   Drug use: Never    Family History  Problem Relation Age of Onset   Hypertension Mother    Heart disease Mother    Vitiligo Mother    Dementia Mother        75/76   Osteoporosis Mother    Heart disease Father    Diabetes Father    Cancer Father        prostate    Heart Problems Father    Cancer Sister        ? type   Colon cancer Sister        unsure if colon or rectal cancer   Ovarian cancer Sister        in her 37s   Heart disease Sister    Heart disease Sister        pacemaker    Lung cancer Maternal Uncle    Sickle cell trait Daughter    Diabetes Mellitus I Son        dx'ed age 29    Vitiligo Son    Skin cancer Other    Esophageal cancer Neg Hx    Rectal cancer Neg Hx    Stomach cancer Neg Hx     Review of Systems: A 12-system review of systems was performed and was negative except as noted in the HPI.  --------------------------------------------------------------------------------------------------  Physical Exam: BP 110/70 (BP Location: Left Arm, Patient Position: Sitting, Cuff Size: Normal)   Pulse 65   Ht '5\' 5"'$  (1.651 m)   Wt 168 lb (76.2 kg)   SpO2 96%   BMI 27.96 kg/m   General:  NAD. Neck: No JVD or HJR. Lungs: Clear to auscultation bilaterally without wheezes or crackles. Heart: Regular rate and rhythm with 2/6 systolic murmur.  No rubs or gallops. Abdomen: Soft, nontender, nondistended. Extremities: No  lower extremity edema.  EKG: Normal sinus rhythm without abnormalities.  Lab Results  Component Value Date   WBC 5.8 05/26/2022   HGB 11.7 (L) 05/26/2022   HCT 35.8 (L) 05/26/2022   MCV 85.4 05/26/2022   PLT 279 05/26/2022    Lab Results  Component Value Date   NA 140 05/26/2022   K 3.9 05/26/2022   CL 109 05/26/2022   CO2 24 05/26/2022   BUN 28 (H) 05/26/2022   CREATININE 0.72 05/26/2022   GLUCOSE 109 (H) 05/26/2022   ALT 28 11/27/2021    Lab Results  Component Value Date   CHOL 189 10/30/2020   HDL 63.70 10/30/2020   LDLCALC 90 10/30/2020   TRIG  173.0 (H) 10/30/2020   CHOLHDL 3 10/30/2020    --------------------------------------------------------------------------------------------------  ASSESSMENT AND PLAN: Chest pain: [name] presents today with ongoing chest pain after having had an episode of severe chest pain 5 nights ago prompting EMS evaluation.  Though she appears well and has a normal EKG, I am concerned about unstable angina in the setting of multiple cardiac risk factors including prior stroke, hypertension, and family history.  We have administered aspirin 325 mg in the office and will transport [name] to the ED for further evaluation.  If her ED evaluation is unrevealing and she is discharged home, we will arrange for coronary CTA for further evaluation.  If she is admitted, inpatient ischemia evaluation will need to be considered.  History of stroke: No new focal neurologic deficits reported.  Suspect lacunar infarcts are driven by hypertension and not cardioembolic disease.  Continue aspirin and statin therapy for target LDL less than 70; recommend escalation of atorvastatin to at least 40 mg daily at the discretion of [name]'s PCP.  Hypertension: Blood pressure well-controlled today.  Continue current doses of triamterene-HCTZ and amlodipine.  Follow-up: To be determined based on ED evaluation.  Nelva Bush, MD 11/14/2022 1:27 PM

## 2022-11-14 NOTE — ED Provider Notes (Addendum)
[facility] Provider Note    Event Date/Time   First MD Initiated Contact with Patient 11/14/22 1439     (approximate)   History   Chest Pain   HPI  [name] is a 67 y.o. female who comes in for Dr. Darnelle Bos office for chest pain.  Patient has had a history of lacunar stroke, hypertension, depression, anxiety who comes in with concerns for some chest pain.  Patient denies ever having a heart attack or stents placed before.  She reports 5 days ago she had some chest discomfort that she called EMS out for and they evaluated her and she declined transport.  She then 3 days ago she had an endoscopy which she reports tolerating well.  Then today she developed some chest pain again that is more like a burning sensation.  She denies any pain in her abdomen.  Denies it being severe in nature.  Physical Exam   Triage Vital Signs: ED Triage Vitals  Enc Vitals Group     BP 11/14/22 1349 129/81     Pulse Rate 11/14/22 1349 69     Resp 11/14/22 1349 18     Temp 11/14/22 1349 98.1 F (36.7 C)     Temp Source 11/14/22 1349 Oral     SpO2 11/14/22 1349 100 %     Weight --      Height --      Head Circumference --      Peak Flow --      Pain Score 11/14/22 1347 7     Pain Loc --      Pain Edu? --      Excl. in [place]? --     Most recent vital signs: Vitals:   11/14/22 1349  BP: 129/81  Pulse: 69  Resp: 18  Temp: 98.1 F (36.7 C)  SpO2: 100%     General: Awake, no distress.  CV:  Good peripheral perfusion.  Resp:  Normal effort.  Abd:  No distention.  Other:  No swelling in legs.  No calf tenderness   ED Results / Procedures / Treatments   Labs (all labs ordered are listed, but only abnormal results are displayed) Labs Reviewed  BASIC METABOLIC PANEL  CBC  HEPATIC FUNCTION PANEL  LIPASE, BLOOD  TROPONIN I (HIGH SENSITIVITY)     EKG  My interpretation of EKG:  Normal sinus rate of 74 without any ST elevation or T wave versions, normal  intervals  RADIOLOGY I have reviewed the xray personally and interpreted and no pneumonia  PROCEDURES:  Critical Care performed: No  .1-3 Lead EKG Interpretation  Performed by: Vanessa [place], MD Authorized by: Vanessa , MD     Interpretation: normal     ECG rate:  60   ECG rate assessment: normal     Rhythm: sinus rhythm     Ectopy: none     Conduction: normal      MEDICATIONS ORDERED IN ED: Medications  pantoprazole (PROTONIX) injection 40 mg (40 mg Intravenous Given 11/14/22 1531)  morphine (PF) 4 MG/ML injection 4 mg (4 mg Intravenous Given 11/14/22 1532)  ondansetron (ZOFRAN) injection 4 mg (4 mg Intravenous Given 11/14/22 1532)     IMPRESSION / MDM / ASSESSMENT AND PLAN / ED COURSE  I reviewed the triage vital signs and the nursing notes.   Patient's presentation is most consistent with acute presentation with potential threat to life or bodily function.   Patient comes in  with some chest pain.  Workup was ordered evaluate for ACS.  Denies any shortness of breath to suggest PE.  No significant abdominal pain to suggest cholecystitis.  Does have recent endoscopy and has history of esophageal stretching but patient does have an endoscopy on 11/11/2022.  She had some pain prior to the endoscopy but it was a different sort of pain and so now her pain is more mid central she is got no crepitus on exam but I wonder if this could be an esophageal issue.  We can add on a gastrogram xray Patient was sent in by Dr. Saunders Revel to ensure that there was not any evidence of unstable angina.  Does not sound exertional in nature.  Patient ended up to oncoming team pending troponins repeat evaluation after some pain medication.  Fluoroscopy Suite closed- therefore x-ray was not able to be ordered therefore we will do CT scan just to ensure no evidence of any perforation given not able to get swallow study  The patient is on the cardiac monitor to evaluate for evidence of arrhythmia and/or  significant heart rate changes.      FINAL CLINICAL IMPRESSION(S) / ED DIAGNOSES   Final diagnoses:  Chest pain, unspecified type     Rx / DC Orders   ED Discharge Orders     None        Note:  This document was prepared using Dragon voice recognition software and may include unintentional dictation errors.   Vanessa Strut[place], MD 11/14/22 1527    Vanessa [place], MD 11/14/22 1539    Vanessa [place], MD 11/14/22 [phone]

## 2022-11-14 NOTE — ED Provider Notes (Signed)
Received in signout from Dr. Nickolas Madrid pending follow-up imaging as well as repeat troponin.  Imaging is reassuring.  Repeat troponin is normal.  She denies any pain or discomfort at this time.  We discussed option for admission the hospital for chest pain rule out versus close outpatient follow-up and she would prefer outpatient follow-up but she is asymptomatic at this time and I think that is reasonable given her reassuring workup.  We discussed signs and symptoms for which she should return to the ER.   Jasmine Lot, MD 11/14/22 2034

## 2022-11-14 NOTE — Addendum Note (Signed)
Addended by: Meryl Crutch on: 11/14/2022 02:02 PM   Modules accepted: Orders

## 2022-11-14 NOTE — Discharge Instructions (Signed)
Follow-up in cardiology clinic.  Return for any additional questions or concerns.

## 2022-11-14 NOTE — ED Triage Notes (Signed)
First nurse note: Pt to ED via POV from heart clinic. Pt seen for CP 7/10. Pt given '325mg'$  ASA at clinic.

## 2022-11-14 NOTE — ED Triage Notes (Signed)
Pt presents to the ED via POV due to CP. Pt states her CP has been intermittent over the past couple days. Pt states the pain is worse today. Pt states she took tums at home with no relief. Pt denies NVD. Pt denies SOB. pT a&oX4

## 2022-11-17 ENCOUNTER — Telehealth: Payer: Self-pay | Admitting: *Deleted

## 2022-11-17 NOTE — Progress Notes (Signed)
  Care Coordination  Note  11/17/2022 Name: [name] MRN: [id number] DOB: August 30, 1956  [name] is a 67 y.o. year old primary care patient of Tomasita Morrow, NP. I reached out to Rosalee Kaufman by phone today to assist with scheduling a follow up appointment. Rosalee Kaufman verbally consented to my assistance.       Follow up plan: Hospital Follow Up appointment scheduled with Wynetta Emery, NP) on (11/19/2022) at (820am).  Julian Hy, [place] Direct Dial: [phone]

## 2022-11-17 NOTE — Transitions of Care (Post Inpatient/ED Visit) (Signed)
   11/17/2022  Name: [name] MRN: [id number] DOB: 1955/10/22  Today's TOC FU Call Status: Today's TOC FU Call Status:: Successful TOC FU Call Competed TOC FU Call Complete Date: 11/17/22  Transition Care Management Follow-up Telephone Call Date of Discharge: 11/15/22 Discharge Facility: [facility]) Type of Discharge: Emergency Department Reason for ED Visit: Other: (chest pain) How have you been since you were released from the hospital?: Same Any questions or concerns?: Yes Patient Questions/Concerns:: I am still hurting.  Items Reviewed: Did you receive and understand the discharge instructions provided?: Yes Medications obtained and verified?: Yes (Medications Reviewed) Any new allergies since your discharge?: No Dietary orders reviewed?: No Do you have support at home?: No  Home Care and Equipment/Supplies: [place] Ordered?: No Any new equipment or medical supplies ordered?: No  Functional Questionnaire: Do you need assistance with bathing/showering or dressing?: No Do you need assistance with meal preparation?: No Do you need assistance with eating?: No Do you have difficulty maintaining continence: No Do you need assistance with getting out of bed/getting out of a chair/moving?: No Do you have difficulty managing or taking your medications?: No  Folllow up appointments reviewed: PCP Follow-up appointment confirmed?: Yes (Care guide scheduled) Date of PCP follow-up appointment?: 11/19/22 Follow-up Provider: Tomasita Morrow NP [facility] Follow-up appointment confirmed?: Yes Date of Specialist follow-up appointment?: 12/01/22 Follow-Up Specialty Provider:: Gastro 2:45 Do you need transportation to your follow-up appointment?: No (Patient uses uber) Do you understand care options if your condition(s) worsen?: Yes-patient verbalized understanding  SDOH Interventions Today    Flowsheet Row Most Recent Value  SDOH  Interventions   Food Insecurity Interventions Intervention Not Indicated  Housing Interventions Intervention Not Indicated  Transportation Interventions Intervention Not Indicated      Per patient she had concerns that she feels about the same. The chest pain is better but it comes and goes. She knows if any changes go to the ER. RN was able to get the patient to schedule a f/u with PCP. Patient is aware that ER r/o MI.   RN was discussing patient medication and noted that the Pepcid that the patient over the counter was 10 mg instead of 20 mg. Patient was not aware.    [place] Care Management [phone]

## 2022-11-18 ENCOUNTER — Telehealth: Payer: Self-pay

## 2022-11-18 ENCOUNTER — Ambulatory Visit: Payer: Medicare Other | Admitting: Orthopaedic Surgery

## 2022-11-18 DIAGNOSIS — R072 Precordial pain: Secondary | ICD-10-CM

## 2022-11-18 MED ORDER — METOPROLOL TARTRATE 25 MG PO TABS: 25.0000 mg | ORAL_TABLET | Freq: Once | ORAL | 0 refills | Status: DC

## 2022-11-18 NOTE — Telephone Encounter (Signed)
Dr. Saunders Revel recommended scheduling pt for Cardiac CTA Pt made aware and agreeable to plan. Orders placed and message sent to scheduling

## 2022-11-18 NOTE — Progress Notes (Deleted)
  Jasmine Morrow, NP-C Phone: [phone]  [name] is a 67 y.o. female who presents today for hospital follow up.   ***  Social History   Tobacco Use  Smoking Status Never  Smokeless Tobacco Never    Current Outpatient Medications on File Prior to Visit  Medication Sig Dispense Refill   amLODipine (NORVASC) 5 MG tablet Take 1 tablet (5 mg total) by mouth daily. D/c 2.5 mg dose. BP goal <130/<80 90 tablet 3   Aspirin 81 MG CAPS Take by mouth daily at 8 pm.     atorvastatin (LIPITOR) 10 MG tablet Take 10 mg by mouth daily.     CALCIUM CITRATE PO Take 600 mg by mouth daily.     famotidine (PEPCID) 20 MG tablet Take 20 mg by mouth at bedtime.     levothyroxine (SYNTHROID) 175 MCG tablet Take 1 tablet (175 mcg total) by mouth daily before breakfast. 30 min before food 90 tablet 3   metoprolol tartrate (LOPRESSOR) 25 MG tablet Take 1 tablet (25 mg total) by mouth once for 1 dose. 2 hours prior to Cardiac CTA 1 tablet 0   omeprazole (PRILOSEC) 40 MG capsule Take 1 capsule (40 mg total) by mouth daily before breakfast. 30 capsule 0   polyethylene glycol powder (GLYCOLAX/MIRALAX) 17 GM/SCOOP powder Take 34 g by mouth as needed.     triamterene-hydrochlorothiazide (MAXZIDE-25) 37.5-25 MG tablet Take 0.5 tablets by mouth daily. Cut pill in 1/2 90 tablet 1   [DISCONTINUED] traZODone (DESYREL) 50 MG tablet Take 0.5-1 tablets (25-50 mg total) by mouth at bedtime as needed for sleep. 90 tablet 3   Current Facility-Administered Medications on File Prior to Visit  Medication Dose Route Frequency Provider Last Rate Last Admin   aspirin tablet 325 mg  325 mg Oral Daily End, Christopher, MD   325 mg at 11/14/22 1340     ROS see history of present illness  Objective  Physical Exam There were no vitals filed for this visit.  BP Readings from Last 3 Encounters:  11/14/22 130/82  11/14/22 110/70  11/12/22 111/84   Wt Readings from Last 3 Encounters:  11/14/22 168 lb (76.2 kg)  11/12/22  167 lb (75.8 kg)  11/11/22 170 lb (77.1 kg)    Physical Exam   Assessment/Plan: Please see individual problem list.  There are no diagnoses linked to this encounter.   Health Maintenance: ***  No follow-ups on file.   Jasmine Morrow, NP-C [place]

## 2022-11-19 ENCOUNTER — Telehealth: Payer: Self-pay

## 2022-11-19 ENCOUNTER — Inpatient Hospital Stay: Payer: Medicare Other | Admitting: Nurse Practitioner

## 2022-11-19 DIAGNOSIS — M810 Age-related osteoporosis without current pathological fracture: Secondary | ICD-10-CM | POA: Diagnosis not present

## 2022-11-19 NOTE — Telephone Encounter (Signed)
Pt called to cancel appt with Tomasita Morrow, NP via access nurse

## 2022-11-20 ENCOUNTER — Telehealth: Payer: Self-pay

## 2022-11-20 NOTE — Transitions of Care (Post Inpatient/ED Visit) (Signed)
   11/20/2022  Name: [name] MRN: [id number] DOB: Jul 10, 1956  Today's TOC FU Call Status: Today's TOC FU Call Status:: Successful TOC FU Call Competed TOC FU Call Complete Date: 11/20/22 (Red on EMMI-ED Discharge Alert Date & Reason:  11/19/22-"Lost interest in things they used to enjoy? Yes"  "Sad, hopeless, anxious or empty? Yes")   Patient reports she responded that way as she feels like she is not getting better in some ways in regards to her neuro/ortho issues which is impairing her functional ability. She is going to look into getting a "second opinion." Denies any cardiac issues. Denies needing any further interventions at this time.   Transition Care Management Follow-up Telephone Call Date of Discharge: 11/15/22 Discharge Facility[facility]) Type of Discharge: Emergency Department Reason for ED Visit: Other: (chest pain) How have you been since you were released from the hospital?: Same Any questions or concerns?: Yes Patient Questions/Concerns:: Patient requesting info on hwo to contact medical records dept to obtain paper copy of her medical info Patient Questions/Concerns Addressed: Other: (pt provided with contact info to medical records dept)     Patient reported that she had issues with her transportation to MD follow up appt and had to reschedule appt to 11/26/22 with PCP.   TOC Call completed on 11/17/22.  TOC Interventions Today    Flowsheet Row Most Recent Value  TOC Interventions   TOC Interventions Discussed/Reviewed TOC Interventions Discussed      Interventions Today    Flowsheet Row Most Recent Value  Mental Health Interventions   Mental Health Discussed/Reviewed Coping Strategies, Mental Health Discussed        [facility] Care Management Care Management Community Coordinator Direct Phone: [phone] Toll Free: [phone] Fax: [phone]

## 2022-11-24 DIAGNOSIS — S92511A Displaced fracture of proximal phalanx of right lesser toe(s), initial encounter for closed fracture: Secondary | ICD-10-CM | POA: Diagnosis not present

## 2022-11-26 ENCOUNTER — Encounter: Payer: Self-pay | Admitting: Nurse Practitioner

## 2022-11-26 ENCOUNTER — Ambulatory Visit (INDEPENDENT_AMBULATORY_CARE_PROVIDER_SITE_OTHER): Payer: Medicare Other | Admitting: Nurse Practitioner

## 2022-11-26 VITALS — BP 126/80 | HR 81 | Temp 97.9°F | Ht 65.0 in | Wt 169.6 lb

## 2022-11-26 DIAGNOSIS — Z23 Encounter for immunization: Secondary | ICD-10-CM

## 2022-11-26 DIAGNOSIS — K219 Gastro-esophageal reflux disease without esophagitis: Secondary | ICD-10-CM

## 2022-11-26 DIAGNOSIS — R079 Chest pain, unspecified: Secondary | ICD-10-CM | POA: Diagnosis not present

## 2022-11-26 DIAGNOSIS — S61031A Puncture wound without foreign body of right thumb without damage to nail, initial encounter: Secondary | ICD-10-CM

## 2022-11-26 MED ORDER — FAMOTIDINE 20 MG PO TABS: 20.0000 mg | ORAL_TABLET | Freq: Every day | ORAL | 3 refills | Status: DC

## 2022-11-26 NOTE — Assessment & Plan Note (Signed)
Patient unsure of last tetanus vaccine. Td vaccine given in office today. Low likelihood for infection, do not believe antibiotics are warranted at this time. Counseled on signs of infection such as warmth, erythema, drainage and encouraged patient to return to care or contact if these develop.

## 2022-11-26 NOTE — Assessment & Plan Note (Addendum)
Resolved. Denies chest pain today. ED notes, labs and imaging from 11/14/2022 all reviewed-WNL. Encouraged patient to follow up with Cardiology as scheduled and complete Cardiac CT on 12/04/2022. Strict return precautions given to patient.

## 2022-11-26 NOTE — Progress Notes (Signed)
Tomasita Morrow, NP-C Phone: [phone]  [name] is a 67 y.o. female who presents today for hospital follow up.   Patient was seen in the ED on 11/14/2022 for chest pain. Work up was normal. EKG, Troponins, chest x-ray and CT Angio Chest were all normal. She reports feeling well today. She is followed by Cardiology. She is scheduled to have a Cardiac CTA next week. Denies chest pain. Denies shortness of breath.   She does report a recent injury to her right thumb 2 days ago. She reports poking it with a metal water pick. Reports it is sore and swollen. Denies bleeding, drainage, or redness. She is not up to date on her Tetanus vaccine and is requesting one today.  She also reports worsening problems with acid reflux. She has been taking Pepcid 10 mg over the counter. She is requesting an increased dose be sent to her pharmacy. She had an upper endoscopy on 11/11/2022 that was normal.  GERD:   Reflux symptoms: Dysphagia, chest discomfort, heartburn   Abd pain: No   Blood in stool: No  Dysphagia: Yes   EGD: Yes  Medication: Omeprazole and Pepcid  Social History   Tobacco Use  Smoking Status Never  Smokeless Tobacco Never    Current Outpatient Medications on File Prior to Visit  Medication Sig Dispense Refill   amLODipine (NORVASC) 5 MG tablet Take 1 tablet (5 mg total) by mouth daily. D/c 2.5 mg dose. BP goal <130/<80 90 tablet 3   Aspirin 81 MG CAPS Take by mouth daily at 8 pm.     atorvastatin (LIPITOR) 10 MG tablet Take 10 mg by mouth daily.     CALCIUM CITRATE PO Take 600 mg by mouth daily.     levothyroxine (SYNTHROID) 175 MCG tablet Take 1 tablet (175 mcg total) by mouth daily before breakfast. 30 min before food 90 tablet 3   omeprazole (PRILOSEC) 40 MG capsule Take 1 capsule (40 mg total) by mouth daily before breakfast. 30 capsule 0   metoprolol tartrate (LOPRESSOR) 25 MG tablet Take 1 tablet (25 mg total) by mouth once for 1 dose. 2 hours prior to Cardiac CTA 1 tablet 0    polyethylene glycol powder (GLYCOLAX/MIRALAX) 17 GM/SCOOP powder Take 34 g by mouth as needed. (Patient not taking: Reported on 11/26/2022)     triamterene-hydrochlorothiazide (MAXZIDE-25) 37.5-25 MG tablet Take 0.5 tablets by mouth daily. Cut pill in 1/2 (Patient not taking: Reported on 11/26/2022) 90 tablet 1   [DISCONTINUED] traZODone (DESYREL) 50 MG tablet Take 0.5-1 tablets (25-50 mg total) by mouth at bedtime as needed for sleep. 90 tablet 3   Current Facility-Administered Medications on File Prior to Visit  Medication Dose Route Frequency Provider Last Rate Last Admin   aspirin tablet 325 mg  325 mg Oral Daily End, Christopher, MD   325 mg at 11/14/22 1340    ROS see history of present illness  Objective  Physical Exam Vitals:   11/26/22 1432  BP: 126/80  Pulse: 81  Temp: 97.9 F (36.6 C)  SpO2: 99%    BP Readings from Last 3 Encounters:  11/26/22 126/80  11/14/22 130/82  11/14/22 110/70   Wt Readings from Last 3 Encounters:  11/26/22 169 lb 9.6 oz (76.9 kg)  11/14/22 168 lb (76.2 kg)  11/12/22 167 lb (75.8 kg)    Physical Exam Constitutional:      General: She is not in acute distress.    Appearance: Normal appearance.  HENT:  Head: Normocephalic.  Cardiovascular:     Rate and Rhythm: Normal rate and regular rhythm.     Heart sounds: Normal heart sounds.  Pulmonary:     Effort: Pulmonary effort is normal.     Breath sounds: Normal breath sounds.  Skin:    General: Skin is warm and dry.     Findings: Wound present.          Comments: Small, pinpoint puncture wound noted on right thumb. Minimal swelling noted. No erythema or warmth present.   Neurological:     General: No focal deficit present.     Mental Status: She is alert.  Psychiatric:        Mood and Affect: Mood normal.        Behavior: Behavior normal.      Assessment/Plan: Please see individual problem list.  Chest pain, unspecified type Assessment & Plan: Resolved. Denies chest pain  today. ED notes, labs and imaging from 11/14/2022 all reviewed-WNL. Encouraged patient to follow up with Cardiology as scheduled and complete Cardiac CT on 12/04/2022. Strict return precautions given to patient.    Puncture wound of right thumb w/o foreign body w/o damage to nail, initial encounter Assessment & Plan: Patient unsure of last tetanus vaccine. Td vaccine given in office today. Low likelihood for infection, do not believe antibiotics are warranted at this time. Counseled on signs of infection such as warmth, erythema, drainage and encouraged patient to return to care or contact if these develop.   Orders: -     Td vaccine greater than or equal to 7yo preservative free IM  Gastroesophageal reflux disease without esophagitis Assessment & Plan: Will increase Pepcid to 20 mg daily at bedtime. Continue Omeprazole daily. Follow up with GI.   Orders: -     Famotidine; Take 1 tablet (20 mg total) by mouth at bedtime.  Dispense: 90 tablet; Refill: 3   Return if symptoms worsen or fail to improve.   Tomasita Morrow, NP-C [place]

## 2022-11-26 NOTE — Assessment & Plan Note (Addendum)
Will increase Pepcid to 20 mg daily at bedtime. Continue Omeprazole daily. Follow up with GI.

## 2022-11-27 ENCOUNTER — Ambulatory Visit
Admission: RE | Admit: 2022-11-27 | Discharge: 2022-11-27 | Disposition: A | Discharge status: Home / Self Care | Payer: Medicare Other | Source: Ambulatory Visit | Attending: Nurse Practitioner | Admitting: Nurse Practitioner

## 2022-11-27 DIAGNOSIS — Z1231 Encounter for screening mammogram for malignant neoplasm of breast: Secondary | ICD-10-CM | POA: Diagnosis not present

## 2022-12-01 ENCOUNTER — Encounter: Payer: Self-pay | Admitting: Gastroenterology

## 2022-12-01 ENCOUNTER — Ambulatory Visit: Payer: Self-pay | Admitting: Gastroenterology

## 2022-12-01 VITALS — BP 110/75 | HR 73 | Temp 98.0°F | Ht 65.0 in | Wt 170.0 lb

## 2022-12-01 DIAGNOSIS — R1310 Dysphagia, unspecified: Secondary | ICD-10-CM

## 2022-12-02 ENCOUNTER — Ambulatory Visit: Payer: Medicare Other | Admitting: Orthopaedic Surgery

## 2022-12-02 ENCOUNTER — Encounter: Payer: Self-pay | Admitting: Orthopaedic Surgery

## 2022-12-02 ENCOUNTER — Ambulatory Visit (INDEPENDENT_AMBULATORY_CARE_PROVIDER_SITE_OTHER): Payer: Medicare Other

## 2022-12-02 DIAGNOSIS — G8929 Other chronic pain: Secondary | ICD-10-CM

## 2022-12-02 DIAGNOSIS — M25511 Pain in right shoulder: Secondary | ICD-10-CM

## 2022-12-02 NOTE — Progress Notes (Signed)
Patient left without being seen.

## 2022-12-02 NOTE — Addendum Note (Signed)
Addended by: Lendon Collar on: 12/02/2022 11:40 AM   Modules accepted: Orders
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
## 2022-12-03 ENCOUNTER — Ambulatory Visit
Admission: RE | Admit: 2022-12-03 | Discharge: 2022-12-03 | Disposition: A | Discharge status: Home / Self Care | Payer: Medicare Other | Source: Ambulatory Visit | Attending: Student in an Organized Health Care Education/Training Program | Admitting: Student in an Organized Health Care Education/Training Program

## 2022-12-03 ENCOUNTER — Encounter: Payer: Self-pay | Admitting: Student in an Organized Health Care Education/Training Program

## 2022-12-03 ENCOUNTER — Telehealth (HOSPITAL_COMMUNITY): Payer: Self-pay | Admitting: *Deleted

## 2022-12-03 ENCOUNTER — Ambulatory Visit (HOSPITAL_BASED_OUTPATIENT_CLINIC_OR_DEPARTMENT_OTHER): Payer: Medicare Other | Admitting: Student in an Organized Health Care Education/Training Program

## 2022-12-03 VITALS — BP 114/86 | HR 70 | Temp 98.0°F | Resp 18 | Ht 65.0 in | Wt 168.0 lb

## 2022-12-03 DIAGNOSIS — M25551 Pain in right hip: Secondary | ICD-10-CM | POA: Insufficient documentation

## 2022-12-03 DIAGNOSIS — G894 Chronic pain syndrome: Secondary | ICD-10-CM | POA: Insufficient documentation

## 2022-12-03 DIAGNOSIS — M542 Cervicalgia: Secondary | ICD-10-CM

## 2022-12-03 DIAGNOSIS — G8929 Other chronic pain: Secondary | ICD-10-CM

## 2022-12-03 DIAGNOSIS — M5412 Radiculopathy, cervical region: Secondary | ICD-10-CM | POA: Insufficient documentation

## 2022-12-03 MED ORDER — DEXAMETHASONE SODIUM PHOSPHATE 10 MG/ML IJ SOLN
10.0000 mg | Freq: Once | INTRAMUSCULAR | Status: AC
  Administered 2022-12-03: 10 mg
  Filled 2022-12-03: qty 1

## 2022-12-03 MED ORDER — IOHEXOL 180 MG/ML  SOLN
10.0000 mL | Freq: Once | INTRAMUSCULAR | Status: AC
  Administered 2022-12-03: 10 mL via EPIDURAL
  Filled 2022-12-03: qty 20

## 2022-12-03 MED ORDER — LIDOCAINE HCL 2 % IJ SOLN
20.0000 mL | Freq: Once | INTRAMUSCULAR | Status: AC
  Administered 2022-12-03: 100 mg
  Filled 2022-12-03: qty 40

## 2022-12-03 MED ORDER — ROPIVACAINE HCL 2 MG/ML IJ SOLN
1.0000 mL | Freq: Once | INTRAMUSCULAR | Status: AC
  Administered 2022-12-03: 1 mL via EPIDURAL
  Filled 2022-12-03: qty 20

## 2022-12-03 MED ORDER — SODIUM CHLORIDE 0.9% FLUSH
1.0000 mL | Freq: Once | INTRAVENOUS | Status: AC
  Administered 2022-12-03: 1 mL

## 2022-12-03 NOTE — Patient Instructions (Signed)
Pain Management Discharge Instructions  General Discharge Instructions :  If you need to reach your doctor call: Monday-Friday 8:00 am - 4:00 pm at [phone] or toll free [phone].  After clinic hours [phone] to have operator reach doctor.  Bring all of your medication bottles to all your appointments in the pain clinic.  To cancel or reschedule your appointment with Pain Management please remember to call 24 hours in advance to avoid a fee.  Refer to the educational materials which you have been given on: General Risks, I had my Procedure. Discharge Instructions, Post Sedation.  Post Procedure Instructions:  The drugs you were given will stay in your system until tomorrow, so for the next 24 hours you should not drive, make any legal decisions or drink any alcoholic beverages.  You may eat anything you prefer, but it is better to start with liquids then soups and crackers, and gradually work up to solid foods.  Please notify your doctor immediately if you have any unusual bleeding, trouble breathing or pain that is not related to your normal pain.  Depending on the type of procedure that was done, some parts of your body may feel week and/or numb.  This usually clears up by tonight or the next day.  Walk with the use of an assistive device or accompanied by an adult for the 24 hours.  You may use ice on the affected area for the first 24 hours.  Put ice in a Ziploc bag and cover with a towel and place against area 15 minutes on 15 minutes off.  You may switch to heat after 24 hours.Epidural Steroid Injection Patient Information  Description: The epidural space surrounds the nerves as they exit the spinal cord.  In some patients, the nerves can be compressed and inflamed by a bulging disc or a tight spinal canal (spinal stenosis).  By injecting steroids into the epidural space, we can bring irritated nerves into direct contact with a potentially helpful medication.  These  steroids act directly on the irritated nerves and can reduce swelling and inflammation which often leads to decreased pain.  Epidural steroids may be injected anywhere along the spine and from the neck to the low back depending upon the location of your pain.   After numbing the skin with local anesthetic (like Novocaine), a small needle is passed into the epidural space slowly.  You may experience a sensation of pressure while this is being done.  The entire block usually last less than 10 minutes.  Conditions which may be treated by epidural steroids:  Low back and leg pain Neck and arm pain Spinal stenosis Post-laminectomy syndrome Herpes zoster (shingles) pain Pain from compression fractures  Preparation for the injection:  Do not eat any solid food or dairy products within 8 hours of your appointment.  You may drink clear liquids up to 3 hours before appointment.  Clear liquids include water, black coffee, juice or soda.  No milk or cream please. You may take your regular medication, including pain medications, with a sip of water before your appointment  Diabetics should hold regular insulin (if taken separately) and take 1/2 normal NPH dos the morning of the procedure.  Carry some sugar containing items with you to your appointment. A driver must accompany you and be prepared to drive you home after your procedure.  Bring all your current medications with your. An IV may be inserted and sedation may be given at the discretion of the physician.     A blood pressure cuff, EKG and other monitors will often be applied during the procedure.  Some patients may need to have extra oxygen administered for a short period. You will be asked to provide medical information, including your allergies, prior to the procedure.  We must know immediately if you are taking blood thinners (like Coumadin/Warfarin)  Or if you are allergic to IV iodine contrast (dye). We must know if you could possible be  pregnant.  Possible side-effects: Bleeding from needle site Infection (rare, may require surgery) Nerve injury (rare) Numbness & tingling (temporary) Difficulty urinating (rare, temporary) Spinal headache ( a headache worse with upright posture) Light -headedness (temporary) Pain at injection site (several days) Decreased blood pressure (temporary) Weakness in arm/leg (temporary) Pressure sensation in back/neck (temporary)  Call if you experience: Fever/chills associated with headache or increased back/neck pain. Headache worsened by an upright position. New onset weakness or numbness of an extremity below the injection site Hives or difficulty breathing (go to the emergency room) Inflammation or drainage at the infection site Severe back/neck pain Any new symptoms which are concerning to you  Please note:  Although the local anesthetic injected can often make your back or neck feel good for several hours after the injection, the pain will likely return.  It takes 3-7 days for steroids to work in the epidural space.  You may not notice any pain relief for at least that one week.  If effective, we will often do a series of three injections spaced 3-6 weeks apart to maximally decrease your pain.  After the initial series, we generally will wait several months before considering a repeat injection of the same type.  If you have any questions, please call [phone] [facility] 

## 2022-12-03 NOTE — Telephone Encounter (Signed)
Attempted to call patient regarding upcoming cardiac CT appointment. °Left message on voicemail with name and callback number ° °Dylan Ruotolo RN Navigator Cardiac Imaging °[facility] °[phone] Office °[phone] Cell ° °

## 2022-12-03 NOTE — Progress Notes (Signed)
Safety precautions to be maintained throughout the outpatient stay will include: orient to surroundings, keep bed in low position, maintain call bell within reach at all times, provide assistance with transfer out of bed and ambulation.  

## 2022-12-03 NOTE — Progress Notes (Signed)
PROVIDER NOTE: Interpretation of information contained herein should be left to medically-trained personnel. Specific patient instructions are provided elsewhere under "Patient Instructions" section of medical record. This document was created in part using STT-dictation technology, any transcriptional errors that may result from this process are unintentional.  Patient: [name] Type: Established DOB: 1956-01-22 MRN: [id number] PCP: Tomasita Morrow, NP  Service: Procedure DOS: 12/03/2022 Setting: Ambulatory Location: Ambulatory outpatient facility Delivery: Face-to-face Provider: Gillis Santa, MD Specialty: Interventional Pain Management Specialty designation: 09 Location: Outpatient facility Ref. Prov.: Tomasita Morrow, NP       Interventional Therapy   Procedure: Cervical Epidural Steroid injection (CESI) (Interlaminar) #1  Laterality: Right  Level: C7-T1 Imaging: Fluoroscopy-assisted DOS: 12/03/2022  Performed by: Gillis Santa, MD Anesthesia: Local anesthesia (1-2% Lidocaine)   Purpose: Diagnostic/Therapeutic Indications: Cervicalgia, cervical radicular pain, degenerative disc disease, severe enough to impact quality of life or function. 1. Cervical radicular pain   2. Cervicalgia   3. Chronic right hip pain   4. Chronic pain syndrome    NAS-11 score:   Pre-procedure: 8 /10   Post-procedure: 4 /10      Position  Prep  Materials:  Location setting: Procedure suite Position: Prone, on modified reverse trendelenburg to facilitate breathing, with head in head-cradle. Pillows positioned under chest (below chin-level) with cervical spine flexed. Safety Precautions: Patient was assessed for positional comfort and pressure points before starting the procedure. Prepping solution: DuraPrep (Iodine Povacrylex [0.7% available iodine] and Isopropyl Alcohol, 74% w/w) Prep Area: Entire  cervicothoracic region Approach: percutaneous, paramedial Intended target: Posterior cervical  epidural space Materials Procedure:  Tray: Epidural Needle(s): Epidural (Tuohy) Qty: 1 Length: (59m) 3.5-inch Gauge: 22G   Pre-op H&P Assessment:  [name] a 67y.o. (year old), female patient, seen today for interventional treatment. She  has a past surgical history that includes Back surgery; Breast surgery; Augmentation mammaplasty (Bilateral, 2006); Esophagogastroduodenoscopy (egd) with propofol (N/A, 10/24/2020); Esophagogastroduodenoscopy (egd) with propofol (N/A, 12/20/2020); Esophageal manometry (N/A, 03/04/2021); 24 hour ph study (N/A, 03/04/2021); PH impedance study (N/A, 03/04/2021); Vein Surgery (1991); Upper gastrointestinal endoscopy; Colonoscopy; Anterior cervical decomp/discectomy fusion (N/A, 05/12/2022); and Esophagogastroduodenoscopy (egd) with propofol (N/A, 11/11/2022). [name] a current medication list which includes the following prescription(s): amlodipine, aspirin, atorvastatin, calcium citrate, levothyroxine, omeprazole, polyethylene glycol powder, triamterene-hydrochlorothiazide, and [DISCONTINUED] trazodone, and the following Facility-Administered Medications: aspirin. Her primarily concern today is the Neck Pain (right)  Initial Vital Signs:  Pulse/HCG Rate: 70ECG Heart Rate: 69 Temp: 98 F (36.7 C) Resp: 18 BP: 124/89 SpO2: 100 %  BMI: Estimated body mass index is 27.96 kg/m as calculated from the following:   Height as of this encounter: '5\' 5"'$  (1.651 m).   Weight as of this encounter: 168 lb (76.2 kg).  Risk Assessment: Allergies: Reviewed. She has No Known Allergies.  Allergy Precautions: None required Coagulopathies: Reviewed. None identified.  Blood-thinner therapy: None at this time Active Infection(s): Reviewed. None identified. [name] afebrile  Site Confirmation: [name] asked to confirm the procedure and laterality before marking the site Procedure checklist: Completed Consent: Before the procedure and under the  influence of no sedative(s), amnesic(s), or anxiolytics, the patient was informed of the treatment options, risks and possible complications. To fulfill our ethical and legal obligations, as recommended by the American Medical Association's Code of Ethics, I have informed the patient of my clinical impression; the nature and purpose of the treatment or procedure; the risks, benefits, and possible complications of the intervention; the alternatives, including  doing nothing; the risk(s) and benefit(s) of the alternative treatment(s) or procedure(s); and the risk(s) and benefit(s) of doing nothing. The patient was provided information about the general risks and possible complications associated with the procedure. These may include, but are not limited to: failure to achieve desired goals, infection, bleeding, organ or nerve damage, allergic reactions, paralysis, and death. In addition, the patient was informed of those risks and complications associated to Spine-related procedures, such as failure to decrease pain; infection (i.e.: Meningitis, epidural or intraspinal abscess); bleeding (i.e.: epidural hematoma, subarachnoid hemorrhage, or any other type of intraspinal or peri-dural bleeding); organ or nerve damage (i.e.: Any type of peripheral nerve, nerve root, or spinal cord injury) with subsequent damage to sensory, motor, and/or autonomic systems, resulting in permanent pain, numbness, and/or weakness of one or several areas of the body; allergic reactions; (i.e.: anaphylactic reaction); and/or death. Furthermore, the patient was informed of those risks and complications associated with the medications. These include, but are not limited to: allergic reactions (i.e.: anaphylactic or anaphylactoid reaction(s)); adrenal axis suppression; blood sugar elevation that in diabetics may result in ketoacidosis or comma; water retention that in patients with history of congestive heart failure may result in shortness of  breath, pulmonary edema, and decompensation with resultant heart failure; weight gain; swelling or edema; medication-induced neural toxicity; particulate matter embolism and blood vessel occlusion with resultant organ, and/or nervous system infarction; and/or aseptic necrosis of one or more joints. Finally, the patient was informed that Medicine is not an exact science; therefore, there is also the possibility of unforeseen or unpredictable risks and/or possible complications that may result in a catastrophic outcome. The patient indicated having understood very clearly. We have given the patient no guarantees and we have made no promises. Enough time was given to the patient to ask questions, all of which were answered to the patient's satisfaction. Ms. Drill has indicated that she wanted to continue with the procedure. Attestation: I, the ordering provider, attest that I have discussed with the patient the benefits, risks, side-effects, alternatives, likelihood of achieving goals, and potential problems during recovery for the procedure that I have provided informed consent. Date  Time: 12/03/2022 10:37 AM   Pre-Procedure Preparation:  Monitoring: As per clinic protocol. Respiration, ETCO2, SpO2, BP, heart rate and rhythm monitor placed and checked for adequate function Safety Precautions: Patient was assessed for positional comfort and pressure points before starting the procedure. Time-out: I initiated and conducted the "Time-out" before starting the procedure, as per protocol. The patient was asked to participate by confirming the accuracy of the "Time Out" information. Verification of the correct person, site, and procedure were performed and confirmed by me, the nursing staff, and the patient. "Time-out" conducted as per Joint Commission's Universal Protocol (UP.01.01.01). Time: 1115  Description  Narrative of Procedure:          Rationale (medical necessity): procedure needed and proper for  the diagnosis and/or treatment of the patient's medical symptoms and needs. Start Time: 1115 hrs. Safety Precautions: Aspiration looking for blood return was conducted prior to all injections. At no point did we inject any substances, as a needle was being advanced. No attempts were made at seeking any paresthesias. Safe injection practices and needle disposal techniques used. Medications properly checked for expiration dates. SDV (single dose vial) medications used. Description of procedure: Protocol guidelines were followed. The patient was assisted into a comfortable position. The target area was identified and the area prepped in the usual manner. Skin & deeper  tissues infiltrated with local anesthetic. Appropriate amount of time allowed to pass for local anesthetics to take effect. Using fluoroscopic guidance, the epidural needle was introduced through the skin, ipsilateral to the reported pain, and advanced to the target area. Posterior laminar os was contacted and the needle walked caudad, until the lamina was cleared. The ligamentum flavum was engaged and the epidural space identified using "loss-of-resistance technique" with 2-3 ml of PF-NaCl (0.9% NSS), in a 5cc dedicated LOR syringe. (See "Imaging guidance" below for use of contrast details.) Once proper needle placement was secured, and negative aspiration confirmed, the solution was injected in intermittent fashion, asking for systemic symptoms every 0.5cc. The needles were then removed and the area cleansed, making sure to leave some of the prepping solution back to take advantage of its long term bactericidal properties.  3 cc solution made of 1 cc of preservative-free saline, 1 cc of 0.2% ropivacaine, 1 cc of Decadron 10 mg/cc.   Vitals:   12/03/22 1041 12/03/22 1114 12/03/22 1119  BP: 124/89 116/80 114/86  Pulse: 70    Resp: '18 16 18  '$ Temp: 98 F (36.7 C)    SpO2: 100% 99% 99%  Weight: 168 lb (76.2 kg)    Height: '5\' 5"'$  (1.651 m)        End Time: 1119 hrs.  Imaging Guidance (Spinal):          Type of Imaging Technique: Fluoroscopy Guidance (Spinal) Indication(s): Assistance in needle guidance and placement for procedures requiring needle placement in or near specific anatomical locations not easily accessible without such assistance. Exposure Time: Please see nurses notes. Contrast: Before injecting any contrast, we confirmed that the patient did not have an allergy to iodine, shellfish, or radiological contrast. Once satisfactory needle placement was completed at the desired level, radiological contrast was injected. Contrast injected under live fluoroscopy. No contrast complications. See chart for type and volume of contrast used. Fluoroscopic Guidance: I was personally present during the use of fluoroscopy. "Tunnel Vision Technique" used to obtain the best possible view of the target area. Parallax error corrected before commencing the procedure. "Direction-depth-direction" technique used to introduce the needle under continuous pulsed fluoroscopy. Once target was reached, antero-posterior, oblique, and lateral fluoroscopic projection used confirm needle placement in all planes. Images permanently stored in EMR. Interpretation: I personally interpreted the imaging intraoperatively. Adequate needle placement confirmed in multiple planes. Appropriate spread of contrast into desired area was observed. No evidence of afferent or efferent intravascular uptake. No intrathecal or subarachnoid spread observed. Permanent images saved into the patient's record.  Post-operative Assessment:  Post-procedure Vital Signs:  Pulse/HCG Rate: 7065 Temp: 98 F (36.7 C) Resp: 18 BP: 114/86 SpO2: 99 %  EBL: None  Complications: No immediate post-treatment complications observed by team, or reported by patient.  Note: The patient tolerated the entire procedure well. A repeat set of vitals were taken after the procedure and the patient was  kept under observation following institutional policy, for this type of procedure. Post-procedural neurological assessment was performed, showing return to baseline, prior to discharge. The patient was provided with post-procedure discharge instructions, including a section on how to identify potential problems. Should any problems arise concerning this procedure, the patient was given instructions to immediately contact us, at any time, without hesitation. In any case, we plan to contact the patient by telephone for a follow-up status report regarding this interventional procedure.  Comments:  No additional relevant information.  Plan of Care (POC)  Orders:  Orders Placed This  Encounter  Procedures   DG PAIN CLINIC C-ARM 1-60 MIN NO REPORT    Intraoperative interpretation by procedural physician at [place].    Standing Status:   Standing    Number of Occurrences:   1    Order Specific Question:   Reason for exam:    Answer:   Assistance in needle guidance and placement for procedures requiring needle placement in or near specific anatomical locations not easily accessible without such assistance.   DG HIP UNILAT W OR W/O PELVIS 2-3 VIEWS RIGHT    Please describe any evidence of DJD, such as joint narrowing, asymmetry, cysts, or any anomalies in bone density, production, or erosion.    Standing Status:   Future    Standing Expiration Date:   01/03/2023    Scheduling Instructions:     Please make sure that the patient understands that this needs to be done as soon as possible. Never have the patient do the imaging "just before the next appointment". Inform patient that having the imaging done within the [facility] will expedite the availability of the results and will provide      imaging availability to the requesting physician. In addition inform the patient that the imaging order has an expiration date and will not be renewed if not done within the active period.    Order  Specific Question:   Reason for Exam (SYMPTOM  OR DIAGNOSIS REQUIRED)    Answer:   Right hip pain/arthralgia    Order Specific Question:   Preferred imaging location?    Answer:   [facility]    Order Specific Question:   Call Results- Best Contact Number?    Answer:   [phone]) [phone] ([facility])    Order Specific Question:   Release to patient    Answer:   Immediate   Patient having increased right hip pain, will order right hip x-ray  Medications ordered for procedure: Meds ordered this encounter  Medications   iohexol (OMNIPAQUE) 180 MG/ML injection 10 mL    Must be Myelogram-compatible. If not available, you may substitute with a water-soluble, non-ionic, hypoallergenic, myelogram-compatible radiological contrast medium.   lidocaine (XYLOCAINE) 2 % (with pres) injection 400 mg   sodium chloride flush (NS) 0.9 % injection 1 mL   ropivacaine (PF) 2 mg/mL (0.2%) (NAROPIN) injection 1 mL   dexamethasone (DECADRON) injection 10 mg   Medications administered: We administered iohexol, lidocaine, sodium chloride flush, ropivacaine (PF) 2 mg/mL (0.2%), and dexamethasone.  See the medical record for exact dosing, route, and time of administration.  Follow-up plan:   Return in about 4 weeks (around 12/31/2022) for Post Procedure Evaluation, in person.       Right SI-J and Right Piriformis 04/17/21, 09/02/21. 11/12/22; right cervical ESI 12/03/2022    Recent Visits Date Type Provider Dept  11/12/22 Procedure visit Gillis Santa, MD [facility]  10/28/22 Office Visit Gillis Santa, MD [facility]  Showing recent visits within past 90 days and meeting all other requirements Today's Visits Date Type Provider Dept  12/03/22 Procedure visit Gillis Santa, MD [facility]  Showing today's visits and meeting all other requirements Future Appointments Date Type Provider Dept  01/05/23 Appointment Gillis Santa, MD [facility]  Showing future  appointments within next 90 days and meeting all other requirements  Disposition: Discharge home  Discharge (Date  Time): 12/03/2022; 1130 hrs.   Primary Care Physician: Tomasita Morrow, NP Location: [facility] Note  by: Gillis Santa, MD (TTS technology used. I apologize for any typographical errors that were not detected and corrected.) Date: 12/03/2022; Time: 11:34 AM  Disclaimer:  Medicine is not an Chief Strategy Officer. The only guarantee in medicine is that nothing is guaranteed. It is important to note that the decision to proceed with this intervention was based on the information collected from the patient. The Data and conclusions were drawn from the patient's questionnaire, the interview, and the physical examination. Because the information was provided in large part by the patient, it cannot be guaranteed that it has not been purposely or unconsciously manipulated. Every effort has been made to obtain as much relevant data as possible for this evaluation. It is important to note that the conclusions that lead to this procedure are derived in large part from the available data. Always take into account that the treatment will also be dependent on availability of resources and existing treatment guidelines, considered by other Pain Management Practitioners as being common knowledge and practice, at the time of the intervention. For Medico-Legal purposes, it is also important to point out that variation in procedural techniques and pharmacological choices are the acceptable norm. The indications, contraindications, technique, and results of the above procedure should only be interpreted and judged by a Board-Certified Interventional Pain Specialist with extensive familiarity and expertise in the same exact procedure and technique.

## 2022-12-04 ENCOUNTER — Ambulatory Visit
Admission: RE | Admit: 2022-12-04 | Discharge: 2022-12-04 | Disposition: A | Discharge status: Home / Self Care | Payer: Medicare Other | Source: Ambulatory Visit | Attending: Internal Medicine | Admitting: Internal Medicine

## 2022-12-04 ENCOUNTER — Telehealth: Payer: Self-pay

## 2022-12-04 DIAGNOSIS — R072 Precordial pain: Secondary | ICD-10-CM | POA: Insufficient documentation

## 2022-12-04 MED ORDER — IOHEXOL 350 MG/ML SOLN
75.0000 mL | Freq: Once | INTRAVENOUS | Status: AC | PRN
  Administered 2022-12-04: 75 mL via INTRAVENOUS

## 2022-12-04 MED ORDER — NITROGLYCERIN 0.4 MG SL SUBL
0.8000 mg | SUBLINGUAL_TABLET | Freq: Once | SUBLINGUAL | Status: AC
  Administered 2022-12-04: 0.8 mg via SUBLINGUAL

## 2022-12-04 NOTE — Telephone Encounter (Signed)
Post procedure follow up.  LM 

## 2022-12-04 NOTE — Progress Notes (Signed)
Patient tolerated CT well. Drank water after. Vital signs stable encourage to drink water throughout day.Reasons explained and verbalized understanding. Ambulated steady gait.  

## 2022-12-15 ENCOUNTER — Ambulatory Visit: Admission: RE | Admit: 2022-12-15 | Payer: Medicare Other | Source: Ambulatory Visit

## 2022-12-17 ENCOUNTER — Inpatient Hospital Stay: Admission: RE | Admit: 2022-12-17 | Payer: Medicare Other | Source: Ambulatory Visit

## 2022-12-29 ENCOUNTER — Ambulatory Visit
Admission: RE | Admit: 2022-12-29 | Discharge: 2022-12-29 | Disposition: A | Discharge status: Home / Self Care | Payer: Medicare Other | Source: Ambulatory Visit | Attending: Orthopaedic Surgery | Admitting: Orthopaedic Surgery

## 2022-12-29 DIAGNOSIS — M25511 Pain in right shoulder: Secondary | ICD-10-CM | POA: Diagnosis not present

## 2022-12-29 DIAGNOSIS — G8929 Other chronic pain: Secondary | ICD-10-CM

## 2022-12-31 ENCOUNTER — Telehealth: Payer: Self-pay | Admitting: Student in an Organized Health Care Education/Training Program

## 2022-12-31 NOTE — Progress Notes (Signed)
Tried to call to schedule follow up. No answer. LMOM for her to call us back to schedule follow up.

## 2022-12-31 NOTE — Progress Notes (Signed)
Needs appt.  thanks

## 2022-12-31 NOTE — Telephone Encounter (Signed)
PT called stated that she wanted to hold off on injections at this time, because they aren't woking and to cancel her appt for F2F,PPE.

## 2022-12-31 NOTE — Telephone Encounter (Signed)
Noted  

## 2023-01-05 ENCOUNTER — Ambulatory Visit: Payer: Medicare Other | Admitting: Student in an Organized Health Care Education/Training Program

## 2023-01-28 ENCOUNTER — Telehealth: Payer: Self-pay | Admitting: Nurse Practitioner

## 2023-01-28 NOTE — Telephone Encounter (Signed)
Contacted [name] to schedule their annual wellness visit. Patient declined to schedule AWV at this time.Not pleased with her providers at this time.  [name]; Care Guide Ambulatory Clinical Support [place] [facility] Direct Dial: [phone]

## 2023-02-03 NOTE — Progress Notes (Unsigned)
Bethanie Dicker, NP-C Phone: [phone]  [name] is a 67 y.o. female who presents today for follow up.  Depression- She is requesting a new referral to Psychiatry. She had an appointment with one scheduled for the end of this month, however it was canceled and rescheduled for July. Reports they offered her a virtual appointment but she would like to be seen in person and prior to July. PHQ-16. She denies SI/HI. She reports increased life stressors, she has a lot going on with her children and a lot of feelings that she has not come to terms with making her overwhelmed. She declines medication at this time.   HYPERTENSION Disease Monitoring Home BP Monitoring- Not checking Chest pain- No    Dyspnea- No Medications Compliance-  Norvasc  Lightheadedness-  No  Edema- No BMET    Component Value Date/Time   NA 137 11/14/2022 1357   K 3.8 11/14/2022 1357   CL 102 11/14/2022 1357   CO2 25 11/14/2022 1357   GLUCOSE 92 11/14/2022 1357   BUN 23 11/14/2022 1357   CREATININE 0.67 11/14/2022 1357   CALCIUM 9.3 11/14/2022 1357   GFRNONAA >60 11/14/2022 1357   GFRAA >60 10/04/2019 1524   GERD:  Followed by Laurette Schimke  Reflux symptoms: Heartburn   Abd pain: No   Blood in stool: No  Dysphagia: Yes   EGD: Yes- February 2024  Medication: Prilosec   Social History   Tobacco Use  Smoking Status Never  Smokeless Tobacco Never    Current Outpatient Medications on File Prior to Visit  Medication Sig Dispense Refill   amLODipine (NORVASC) 5 MG tablet Take 1 tablet (5 mg total) by mouth daily. D/c 2.5 mg dose. BP goal <130/<80 90 tablet 3   Aspirin 81 MG CAPS Take by mouth daily at 8 pm.     atorvastatin (LIPITOR) 10 MG tablet Take 10 mg by mouth daily.     levothyroxine (SYNTHROID) 175 MCG tablet Take 1 tablet (175 mcg total) by mouth daily before breakfast. 30 min before food 90 tablet 3   [DISCONTINUED] traZODone (DESYREL) 50 MG tablet Take 0.5-1 tablets (25-50 mg total) by mouth at bedtime  as needed for sleep. 90 tablet 3   Current Facility-Administered Medications on File Prior to Visit  Medication Dose Route Frequency Provider Last Rate Last Admin   aspirin tablet 325 mg  325 mg Oral Daily End, Christopher, MD   325 mg at 11/14/22 1340    ROS see history of present illness  Objective  Physical Exam Vitals:   02/04/23 0819  BP: 100/64  Pulse: 71  Temp: 98.3 F (36.8 C)  SpO2: 98%    BP Readings from Last 3 Encounters:  02/04/23 100/64  12/04/22 120/86  12/03/22 114/86   Wt Readings from Last 3 Encounters:  02/04/23 168 lb 12.8 oz (76.6 kg)  12/03/22 168 lb (76.2 kg)  12/01/22 170 lb (77.1 kg)    Physical Exam Constitutional:      General: She is not in acute distress.    Appearance: Normal appearance.  HENT:     Head: Normocephalic.  Cardiovascular:     Rate and Rhythm: Normal rate and regular rhythm.     Heart sounds: Normal heart sounds.  Pulmonary:     Effort: Pulmonary effort is normal.     Breath sounds: Normal breath sounds.  Skin:    General: Skin is warm and dry.  Neurological:     General: No focal deficit present.  Mental Status: She is alert.  Psychiatric:        Mood and Affect: Mood normal.        Behavior: Behavior normal.    Assessment/Plan: Please see individual problem list.  Episode of recurrent major depressive disorder, unspecified depression episode severity (HCC) Assessment & Plan: Will refer to new Psychiatrist. Patient declines medication at this time. PHQ- 16. Denies SI/HI. Encouraged to contact if worsening symptoms, unusual behavior changes or suicidal thoughts occur. Will continue to monitor.   Orders: -     Ambulatory referral to Psychiatry  Essential hypertension Assessment & Plan: Chronic. Stable on Norvasc daily. Patient has not been taking her Maxzide daily. BP low normal in office. Will discontinue Maxzide. She will continue Norvasc 5 mg daily. Encouraged to start checking her blood pressure at home  and keep a log to bring with her to her next appointment. Will continue to monitor.     Gastroesophageal reflux disease without esophagitis Assessment & Plan: Chronic. Stable on Prilosec 40 mg daily. Continue. Followed by Laurette Schimke for dysphagia. No longer taking Pepcid at bedtime. Counseled on dietary triggers, such as decreasing alcohol and caffeine intake and avoiding spicy and acidic foods. Dietary information provided to patient. Will continue to monitor.   Orders: -     Omeprazole; Take 1 capsule (40 mg total) by mouth daily before breakfast.  Dispense: 90 capsule; Refill: 3    Return in about 4 months (around 06/07/2023) for Annual Exam/Pap.   Bethanie Dicker, NP-C [facility] [employer]

## 2023-02-04 ENCOUNTER — Encounter: Payer: Self-pay | Admitting: Nurse Practitioner

## 2023-02-04 ENCOUNTER — Ambulatory Visit (INDEPENDENT_AMBULATORY_CARE_PROVIDER_SITE_OTHER): Payer: Medicare Other | Admitting: Nurse Practitioner

## 2023-02-04 VITALS — BP 100/64 | HR 71 | Temp 98.3°F | Ht 65.0 in | Wt 168.8 lb

## 2023-02-04 DIAGNOSIS — I10 Essential (primary) hypertension: Secondary | ICD-10-CM | POA: Diagnosis not present

## 2023-02-04 DIAGNOSIS — F339 Major depressive disorder, recurrent, unspecified: Secondary | ICD-10-CM | POA: Diagnosis not present

## 2023-02-04 DIAGNOSIS — K219 Gastro-esophageal reflux disease without esophagitis: Secondary | ICD-10-CM | POA: Diagnosis not present

## 2023-02-04 MED ORDER — OMEPRAZOLE 40 MG PO CPDR
40.0000 mg | DELAYED_RELEASE_CAPSULE | Freq: Every day | ORAL | 3 refills | Status: DC
Start: 2023-02-04 — End: 2024-02-22

## 2023-02-04 NOTE — Assessment & Plan Note (Signed)
Chronic. Stable on Prilosec 40 mg daily. Continue. Followed by Laurette Schimke for dysphagia. No longer taking Pepcid at bedtime. Counseled on dietary triggers, such as decreasing alcohol and caffeine intake and avoiding spicy and acidic foods. Dietary information provided to patient. Will continue to monitor.

## 2023-02-04 NOTE — Assessment & Plan Note (Signed)
Will refer to new Psychiatrist. Patient declines medication at this time. PHQ- 16. Denies SI/HI. Encouraged to contact if worsening symptoms, unusual behavior changes or suicidal thoughts occur. Will continue to monitor.

## 2023-02-04 NOTE — Assessment & Plan Note (Signed)
Chronic. Stable on Norvasc daily. Patient has not been taking her Maxzide daily. BP low normal in office. Will discontinue Maxzide. She will continue Norvasc 5 mg daily. Encouraged to start checking her blood pressure at home and keep a log to bring with her to her next appointment. Will continue to monitor.

## 2023-02-18 ENCOUNTER — Other Ambulatory Visit: Payer: Self-pay

## 2023-02-18 DIAGNOSIS — M5412 Radiculopathy, cervical region: Secondary | ICD-10-CM

## 2023-02-19 ENCOUNTER — Ambulatory Visit: Payer: Medicare Other | Admitting: Neurosurgery

## 2023-02-19 ENCOUNTER — Ambulatory Visit: Payer: Self-pay | Admitting: Psychiatry

## 2023-02-24 DIAGNOSIS — K080 Exfoliation of teeth due to systemic causes: Secondary | ICD-10-CM | POA: Diagnosis not present

## 2023-03-02 ENCOUNTER — Other Ambulatory Visit: Payer: Self-pay

## 2023-03-02 DIAGNOSIS — M5412 Radiculopathy, cervical region: Secondary | ICD-10-CM

## 2023-03-03 ENCOUNTER — Ambulatory Visit: Payer: Medicare Other | Admitting: Neurosurgery

## 2023-03-12 ENCOUNTER — Ambulatory Visit: Payer: Medicare Other | Admitting: Neurosurgery

## 2023-03-13 ENCOUNTER — Ambulatory Visit: Payer: Medicare Other | Attending: Internal Medicine | Admitting: Internal Medicine

## 2023-03-13 NOTE — Progress Notes (Deleted)
Follow-up Outpatient Visit Date: 03/13/2023  Primary Care Provider: Bethanie Dicker, NP [address] Laurell Josephs [address] [place] [address]  Chief Complaint: ***  HPI:  Ms. Myren is a 67 y.o. female with history of incidentally noted lacunar stroke, hypertension, hypothyroidism, arthritis, and depression/anxiety, who presents for follow-up of chest pain.  I last saw her in February, at which time she reported an episode of chest pain that occurred while she was eating chicken wings.  It persisted for several hours and ultimately prompted her to call 911.  However, EMS evaluation was unremarkable and Ms. Haller declined transport to the emergency department.  She complained of persistent chest pain in the office at our last visit, prompting referral to the ED.  Workup there was reassuring with subsequent coronary CTA showing normal coronary arteries.  --------------------------------------------------------------------------------------------------  Cardiovascular History & Procedures: Cardiovascular Problems: Lacunar stroke Chest pain   Risk Factors: Stroke and hypertension   Cath/PCI: None   CV Surgery: None   EP Procedures and Devices: Event monitor (06/13/2022): Patient was monitored for 11 days, 23 hours.  Predominantly sinus rhythm with rare PACs and PVCs as well as a few brief episodes of PSVT lasting up to 8 beats.  No evidence of atrial fibrillation/flutter.   Non-Invasive Evaluation(s): Coronary CTA (12/04/2022): Normal coronary arteries without calcification or stenosis.  No significant extracardiac findings in the visualized chest. TTE with bubble study (07/17/2022): Normal LV size with borderline LVH.  LVEF 60-65% with normal wall motion.  Grade 1 diastolic dysfunction.  Normal RV size and function.  Mild left atrial enlargement.  Mild mitral regurgitation.  Otherwise, no significant valvular abnormality.  CVP 3.  Bubble study negative for interatrial shunt.  Recent CV  Pertinent Labs: Lab Results  Component Value Date   CHOL 189 10/30/2020   HDL 63.70 10/30/2020   LDLCALC 90 10/30/2020   TRIG 173.0 (H) 10/30/2020   CHOLHDL 3 10/30/2020   K 3.8 11/14/2022   BUN 23 11/14/2022   CREATININE 0.67 11/14/2022    Past medical and surgical history were reviewed and updated in EPIC.  No outpatient medications have been marked as taking for the 03/13/23 encounter (Appointment) with Mackinzie Vuncannon, Cristal Deer, MD.   Current Facility-Administered Medications for the 03/13/23 encounter (Appointment) with Katrece Roediger, Cristal Deer, MD  Medication   aspirin tablet 325 mg    Allergies: Patient has no known allergies.  Social History   Tobacco Use   Smoking status: Never   Smokeless tobacco: Never  Vaping Use   Vaping Use: Never used  Substance Use Topics   Alcohol use: Yes    Alcohol/week: 1.0 standard drink of alcohol    Types: 1 Standard drinks or equivalent per week    Comment: socially   Drug use: Never    Family History  Problem Relation Age of Onset   Hypertension Mother    Heart disease Mother    Vitiligo Mother    Dementia Mother        75/76   Osteoporosis Mother    Heart disease Father    Diabetes Father    Cancer Father        prostate    Heart Problems Father    Cancer Sister        ? type   Colon cancer Sister        unsure if colon or rectal cancer   Ovarian cancer Sister        in her 6s   Heart disease Sister  Heart disease Sister        pacemaker    Sickle cell trait Daughter    Lung cancer Maternal Uncle    Diabetes Mellitus I Son        dx'ed age 23    Vitiligo Son    Skin cancer Other    Esophageal cancer Neg Hx    Rectal cancer Neg Hx    Stomach cancer Neg Hx    Breast cancer Neg Hx     Review of Systems: A 12-system review of systems was performed and was negative except as noted in the HPI.  --------------------------------------------------------------------------------------------------  Physical Exam: There were  no vitals taken for this visit.  General:  NAD. Neck: No JVD or HJR. Lungs: Clear to auscultation bilaterally without wheezes or crackles. Heart: Regular rate and rhythm without murmurs, rubs, or gallops. Abdomen: Soft, nontender, nondistended. Extremities: No lower extremity edema.  EKG:  ***  Lab Results  Component Value Date   WBC 6.4 11/14/2022   HGB 12.7 11/14/2022   HCT 38.8 11/14/2022   MCV 86.8 11/14/2022   PLT 238 11/14/2022    Lab Results  Component Value Date   NA 137 11/14/2022   K 3.8 11/14/2022   CL 102 11/14/2022   CO2 25 11/14/2022   BUN 23 11/14/2022   CREATININE 0.67 11/14/2022   GLUCOSE 92 11/14/2022   ALT 25 11/14/2022    Lab Results  Component Value Date   CHOL 189 10/30/2020   HDL 63.70 10/30/2020   LDLCALC 90 10/30/2020   TRIG 173.0 (H) 10/30/2020   CHOLHDL 3 10/30/2020    --------------------------------------------------------------------------------------------------  ASSESSMENT AND PLAN: Cristal Deer Stormi Vandevelde, MD 03/13/2023 8:03 AM

## 2023-03-16 ENCOUNTER — Encounter: Payer: Self-pay | Admitting: Internal Medicine

## 2023-04-01 DIAGNOSIS — K080 Exfoliation of teeth due to systemic causes: Secondary | ICD-10-CM | POA: Diagnosis not present

## 2023-04-09 ENCOUNTER — Encounter: Payer: Self-pay | Admitting: Student in an Organized Health Care Education/Training Program

## 2023-04-09 ENCOUNTER — Ambulatory Visit
Payer: Medicare Other | Attending: Student in an Organized Health Care Education/Training Program | Admitting: Student in an Organized Health Care Education/Training Program

## 2023-04-09 VITALS — BP 177/99 | HR 70 | Temp 98.1°F | Resp 18 | Ht 65.0 in | Wt 165.0 lb

## 2023-04-09 DIAGNOSIS — G5701 Lesion of sciatic nerve, right lower limb: Secondary | ICD-10-CM

## 2023-04-09 DIAGNOSIS — M47818 Spondylosis without myelopathy or radiculopathy, sacral and sacrococcygeal region: Secondary | ICD-10-CM | POA: Diagnosis not present

## 2023-04-09 DIAGNOSIS — M5416 Radiculopathy, lumbar region: Secondary | ICD-10-CM | POA: Insufficient documentation

## 2023-04-09 DIAGNOSIS — G8929 Other chronic pain: Secondary | ICD-10-CM | POA: Diagnosis not present

## 2023-04-09 DIAGNOSIS — M4726 Other spondylosis with radiculopathy, lumbar region: Secondary | ICD-10-CM | POA: Diagnosis not present

## 2023-04-09 DIAGNOSIS — G894 Chronic pain syndrome: Secondary | ICD-10-CM | POA: Diagnosis not present

## 2023-04-09 DIAGNOSIS — M533 Sacrococcygeal disorders, not elsewhere classified: Secondary | ICD-10-CM | POA: Diagnosis not present

## 2023-04-09 NOTE — Patient Instructions (Signed)
____________________________________________________________________________________________  General Risks and Possible Complications  Patient Responsibilities: It is important that you read this as it is part of your informed consent. It is our duty to inform you of the risks and possible complications associated with treatments offered to you. It is your responsibility as a patient to read this and to ask questions about anything that is not clear or that you believe was not covered in this document.  Patient's Rights: You have the right to refuse treatment. You also have the right to change your mind, even after initially having agreed to have the treatment done. However, under this last option, if you wait until the last second to change your mind, you may be charged for the materials used up to that point.  Introduction: Medicine is not an Visual merchandiser. Everything in Medicine, including the lack of treatment(s), carries the potential for danger, harm, or loss (which is by definition: Risk). In Medicine, a complication is a secondary problem, condition, or disease that can aggravate an already existing one. All treatments carry the risk of possible complications. The fact that a side effects or complications occurs, does not imply that the treatment was conducted incorrectly. It must be clearly understood that these can happen even when everything is done following the highest safety standards.  No treatment: You can choose not to proceed with the proposed treatment alternative. The "PRO(s)" would include: avoiding the risk of complications associated with the therapy. The "CON(s)" would include: not getting any of the treatment benefits. These benefits fall under one of three categories: diagnostic; therapeutic; and/or palliative. Diagnostic benefits include: getting information which can ultimately lead to improvement of the disease or symptom(s). Therapeutic benefits are those associated with  the successful treatment of the disease. Finally, palliative benefits are those related to the decrease of the primary symptoms, without necessarily curing the condition (example: decreasing the pain from a flare-up of a chronic condition, such as incurable terminal cancer).  General Risks and Complications: These are associated to most interventional treatments. They can occur alone, or in combination. They fall under one of the following six (6) categories: no benefit or worsening of symptoms; bleeding; infection; nerve damage; allergic reactions; and/or death. No benefits or worsening of symptoms: In Medicine there are no guarantees, only probabilities. No healthcare provider can ever guarantee that a medical treatment will work, they can only state the probability that it may. Furthermore, there is always the possibility that the condition may worsen, either directly, or indirectly, as a consequence of the treatment. Bleeding: This is more common if the patient is taking a blood thinner, either prescription or over the counter (example: Goody Powders, Fish oil, Aspirin, Garlic, etc.), or if suffering a condition associated with impaired coagulation (example: Hemophilia, cirrhosis of the liver, low platelet counts, etc.). However, even if you do not have one on these, it can still happen. If you have any of these conditions, or take one of these drugs, make sure to notify your treating physician. Infection: This is more common in patients with a compromised immune system, either due to disease (example: diabetes, cancer, human immunodeficiency virus [HIV], etc.), or due to medications or treatments (example: therapies used to treat cancer and rheumatological diseases). However, even if you do not have one on these, it can still happen. If you have any of these conditions, or take one of these drugs, make sure to notify your treating physician. Nerve Damage: This is more common when the treatment is an  invasive one, but it can also happen with the use of medications, such as those used in the treatment of cancer. The damage can occur to small secondary nerves, or to large primary ones, such as those in the spinal cord and brain. This damage may be temporary or permanent and it may lead to impairments that can range from temporary numbness to permanent paralysis and/or brain death. Allergic Reactions: Any time a substance or material comes in contact with our body, there is the possibility of an allergic reaction. These can range from a mild skin rash (contact dermatitis) to a severe systemic reaction (anaphylactic reaction), which can result in death. Death: In general, any medical intervention can result in death, most of the time due to an unforeseen complication. ____________________________________________________________________________________________    ______________________________________________________________________  Preparing for your procedure  Appointments: If you think you may not be able to keep your appointment, call 24-48 hours in advance to cancel. We need time to make it available to others.  During your procedure appointment there will be: No Prescription Refills. No disability issues to discussed. No medication changes or discussions.  Instructions: Food intake: Avoid eating anything solid for at least 8 hours prior to your procedure. Clear liquid intake: You may take clear liquids such as water up to 2 hours prior to your procedure. (No carbonated drinks. No soda.) Transportation: Unless otherwise stated by your physician, bring a driver. Morning Medicines: Except for blood thinners, take all of your other morning medications with a sip of water. Make sure to take your heart and blood pressure medicines. If your blood pressure's lower number is above 100, the case will be rescheduled. Blood thinners: Make sure to stop your blood thinners as instructed.  If you take  a blood thinner, but were not instructed to stop it, call our office [phone] and ask to talk to a nurse. Not stopping a blood thinner prior to certain procedures could lead to serious complications. Diabetics on insulin: Notify the staff so that you can be scheduled 1st case in the morning. If your diabetes requires high dose insulin, take only  of your normal insulin dose the morning of the procedure and notify the staff that you have done so. Preventing infections: Shower with an antibacterial soap the morning of your procedure.  Build-up your immune system: Take 1000 mg of Vitamin C with every meal (3 times a day) the day prior to your procedure. Antibiotics: Inform the nursing staff if you are taking any antibiotics or if you have any conditions that may require antibiotics prior to procedures. (Example: recent joint implants)   Pregnancy: If you are pregnant make sure to notify the nursing staff. Not doing so may result in injury to the fetus, including death.  Sickness: If you have a cold, fever, or any active infections, call and cancel or reschedule your procedure. Receiving steroids while having an infection may result in complications. Arrival: You must be in the facility at least 30 minutes prior to your scheduled procedure. Tardiness: Your scheduled time is also the cutoff time. If you do not arrive at least 15 minutes prior to your procedure, you will be rescheduled.  Children: Do not bring any children with you. Make arrangements to keep them home. Dress appropriately: There is always a possibility that your clothing may get soiled. Avoid long dresses. Valuables: Do not bring any jewelry or valuables.  Reasons to call and reschedule or cancel your procedure: (Following these recommendations will minimize the risk  of a serious complication.) Surgeries: Avoid having procedures within 2 weeks of any surgery. (Avoid for 2 weeks before or after any surgery). Flu Shots: Avoid having  procedures within 2 weeks of a flu shots or . (Avoid for 2 weeks before or after immunizations). Barium: Avoid having a procedure within 7-10 days after having had a radiological study involving the use of radiological contrast. (Myelograms, Barium swallow or enema study). Heart attacks: Avoid any elective procedures or surgeries for the initial 6 months after a "Myocardial Infarction" (Heart Attack). Blood thinners: It is imperative that you stop these medications before procedures. Let us know if you if you take any blood thinner.  Infection: Avoid procedures during or within two weeks of an infection (including chest colds or gastrointestinal problems). Symptoms associated with infections include: Localized redness, fever, chills, night sweats or profuse sweating, burning sensation when voiding, cough, congestion, stuffiness, runny nose, sore throat, diarrhea, nausea, vomiting, cold or Flu symptoms, recent or current infections. It is specially important if the infection is over the area that we intend to treat. Heart and lung problems: Symptoms that may suggest an active cardiopulmonary problem include: cough, chest pain, breathing difficulties or shortness of breath, dizziness, ankle swelling, uncontrolled high or unusually low blood pressure, and/or palpitations. If you are experiencing any of these symptoms, cancel your procedure and contact your primary care physician for an evaluation.  Remember:  Regular Business hours are:  Monday to Thursday 8:00 AM to 4:00 PM  Provider's Schedule: Delano Metz, MD:  Procedure days: Tuesday and Thursday 7:30 AM to 4:00 PM  Edward Jolly, MD:  Procedure days: Monday and Wednesday 7:30 AM to 4:00 PM  ______________________________________________________________________   Sacroiliac (SI) Joint Injection Patient Information  Description: The sacroiliac joint connects the scrum (very low back and tailbone) to the ilium (a pelvic bone which also  forms half of the hip joint).  Normally this joint experiences very little motion.  When this joint becomes inflamed or unstable low back and or hip and pelvis pain may result.  Injection of this joint with local anesthetics (numbing medicines) and steroids can provide diagnostic information and reduce pain.  This injection is performed with the aid of x-ray guidance into the tailbone area while you are lying on your stomach.   You may experience an electrical sensation down the leg while this is being done.  You may also experience numbness.  We also may ask if we are reproducing your normal pain during the injection.  Conditions which may be treated SI injection:  Low back, buttock, hip or leg pain  Preparation for the Injection:  Do not eat any solid food or dairy products within 8 hours of your appointment.  You may drink clear liquids up to 3 hours before appointment.  Clear liquids include water, black coffee, juice or soda.  No milk or cream please. You may take your regular medications, including pain medications with a sip of water before your appointment.  Diabetics should hold regular insulin (if take separately) and take 1/2 normal NPH dose the morning of the procedure.  Carry some sugar containing items with you to your appointment. A driver must accompany you and be prepared to drive you home after your procedure. Bring all of your current medications with you. An IV may be inserted and sedation may be given at the discretion of the physician. A blood pressure cuff, EKG and other monitors will often be applied during the procedure.  Some patients may need  to have extra oxygen administered for a short period.  You will be asked to provide medical information, including your allergies, prior to the procedure.  We must know immediately if you are taking blood thinners (like Coumadin/Warfarin) or if you are allergic to IV iodine contrast (dye).  We must know if you could possible be  pregnant.  Possible side effects:  Bleeding from needle site Infection (rare, may require surgery) Nerve injury (rare) Numbness & tingling (temporary) A brief convulsion or seizure Light-headedness (temporary) Pain at injection site (several days) Decreased blood pressure (temporary) Weakness in the leg (temporary)   Call if you experience:  New onset weakness or numbness of an extremity below the injection site that last more than 8 hours. Hives or difficulty breathing ( go to the emergency room) Inflammation or drainage at the injection site Any new symptoms which are concerning to you  Please note:  Although the local anesthetic injected can often make your back/ hip/ buttock/ leg feel good for several hours after the injections, the pain will likely return.  It takes 3-7 days for steroids to work in the sacroiliac area.  You may not notice any pain relief for at least that one week.  If effective, we will often do a series of three injections spaced 3-6 weeks apart to maximally decrease your pain.  After the initial series, we generally will wait some months before a repeat injection of the same type.  If you have any questions, please call [phone] [facility]  Trigger Point Injection Trigger points are areas where you have pain. A trigger point injection is a shot given in the trigger point to help relieve pain for a few days to a few months. Common places for trigger points include the neck, shoulders, upper back, or lower back. A trigger point injection will not cure long-term (chronic) pain permanently. These injections do not always work for every person. For some people, they can help to relieve pain for a few days to a few months. Tell a health care provider about: Any allergies you have. All medicines you are taking, including vitamins, herbs, eye drops, creams, and over-the-counter medicines. Any problems you or family members  have had with anesthetic medicines. Any bleeding problems you have. Any surgeries you have had. Any medical conditions you have. Whether you are pregnant or may be pregnant. What are the risks? Generally, this is a safe procedure. However, problems may occur, including: Infection. Bleeding or bruising. Allergic reaction to the injected medicine. Irritation of the skin around the injection site. What happens before the procedure? Ask your health care provider about: Changing or stopping your regular medicines. This is especially important if you are taking diabetes medicines or blood thinners. Taking medicines such as aspirin and ibuprofen. These medicines can thin your blood. Do not take these medicines unless your health care provider tells you to take them. Taking over-the-counter medicines, vitamins, herbs, and supplements. What happens during the procedure?  Your health care provider will feel for trigger points. A marker may be used to circle the area for the injection. The skin over the trigger point will be washed with a germ-killing soap. You may be given a medicine to help you relax (sedative). A thin needle is used for the injection. You may feel pain or a twitching feeling when the needle enters your skin. A numbing solution may be injected into the trigger point. Sometimes a medicine to keep down inflammation is  also injected. Your health care provider may move the needle around the area where the trigger point is located until the tightness and twitching goes away. After the injection, your health care provider may put gentle pressure over the injection site. The injection site will be covered with a bandage (dressing). The procedure may vary among health care providers and hospitals. What can I expect after treatment? After treatment, you may have soreness and stiffness for 1-2 days. Follow these instructions at home: Injection site care Remove your dressing in a few  hours, or as told by your health care provider. Check your injection site every day for signs of infection. Check for: Redness, swelling, or pain. Fluid or blood. Warmth. Pus or a bad smell. Managing pain, stiffness, and swelling If directed, put ice on the affected area. To do this: Put ice in a plastic bag. Place a towel between your skin and the bag. Leave the ice on for 20 minutes, 2-3 times a day. Remove the ice if your skin turns bright red. This is very important. If you cannot feel pain, heat, or cold, you have a greater risk of damage to the area. Activity If you were given a sedative during the procedure, it can affect you for several hours. Do not drive or operate machinery until your health care provider says that it is safe. Do not take baths, swim, or use a hot tub until your health care provider approves. Return to your normal activities as told by your health care provider. Ask your health care provider what activities are safe for you. General instructions If you were asked to stop your regular medicines, ask your health care provider when you may start taking them again. You may be asked to see an occupational or physical therapist for exercises that reduce muscle strain and stretch the area of the trigger point. Keep all follow-up visits. This is important. Contact a health care provider if: Your pain comes back, and it is worse than before the injection. You may need more injections. You have chills or a fever. The injection site becomes more painful, red, swollen, or warm to the touch. Summary A trigger point injection is a shot given in the trigger point to help relieve pain. Common places for trigger point injections are the neck, shoulders, upper back, and lower back. These injections do not always work for every person, but for some people, the injections can help to relieve pain for a few days to a few months. Contact a health care provider if symptoms come back  or if they are worse than before treatment. Also, get help if the injection site becomes more painful, red, swollen, or warm to the touch. This information is not intended to replace advice given to you by your health care provider. Make sure you discuss any questions you have with your health care provider. Document Revised: 12/18/2020 Document Reviewed: 12/18/2020 [employer] Patient Education  2024 [employer] Epidural Steroid Injection Patient Information  Description: The epidural space surrounds the nerves as they exit the spinal cord.  In some patients, the nerves can be compressed and inflamed by a bulging disc or a tight spinal canal (spinal stenosis).  By injecting steroids into the epidural space, we can bring irritated nerves into direct contact with a potentially helpful medication.  These steroids act directly on the irritated nerves and can reduce swelling and inflammation which often leads to decreased pain.  Epidural steroids may be injected anywhere along the spine and  from the neck to the low back depending upon the location of your pain.   After numbing the skin with local anesthetic (like Novocaine), a small needle is passed into the epidural space slowly.  You may experience a sensation of pressure while this is being done.  The entire block usually last less than 10 minutes.  Conditions which may be treated by epidural steroids:  Low back and leg pain Neck and arm pain Spinal stenosis Post-laminectomy syndrome Herpes zoster (shingles) pain Pain from compression fractures  Preparation for the injection:  Do not eat any solid food or dairy products within 8 hours of your appointment.  You may drink clear liquids up to 3 hours before appointment.  Clear liquids include water, black coffee, juice or soda.  No milk or cream please. You may take your regular medication, including pain medications, with a sip of water before your appointment  Diabetics should hold regular  insulin (if taken separately) and take 1/2 normal NPH dos the morning of the procedure.  Carry some sugar containing items with you to your appointment. A driver must accompany you and be prepared to drive you home after your procedure.  Bring all your current medications with your. An IV may be inserted and sedation may be given at the discretion of the physician.   A blood pressure cuff, EKG and other monitors will often be applied during the procedure.  Some patients may need to have extra oxygen administered for a short period. You will be asked to provide medical information, including your allergies, prior to the procedure.  We must know immediately if you are taking blood thinners (like Coumadin/Warfarin)  Or if you are allergic to IV iodine contrast (dye). We must know if you could possible be pregnant.  Possible side-effects: Bleeding from needle site Infection (rare, may require surgery) Nerve injury (rare) Numbness & tingling (temporary) Difficulty urinating (rare, temporary) Spinal headache ( a headache worse with upright posture) Light -headedness (temporary) Pain at injection site (several days) Decreased blood pressure (temporary) Weakness in arm/leg (temporary) Pressure sensation in back/neck (temporary)  Call if you experience: Fever/chills associated with headache or increased back/neck pain. Headache worsened by an upright position. New onset weakness or numbness of an extremity below the injection site Hives or difficulty breathing (go to the emergency room) Inflammation or drainage at the infection site Severe back/neck pain Any new symptoms which are concerning to you  Please note:  Although the local anesthetic injected can often make your back or neck feel good for several hours after the injection, the pain will likely return.  It takes 3-7 days for steroids to work in the epidural space.  You may not notice any pain relief for at least that one week.  If  effective, we will often do a series of three injections spaced 3-6 weeks apart to maximally decrease your pain.  After the initial series, we generally will wait several months before considering a repeat injection of the same type.  If you have any questions, please call [phone] [facility]

## 2023-04-09 NOTE — Progress Notes (Signed)
PROVIDER NOTE: Information contained herein reflects review and annotations entered in association with encounter. Interpretation of such information and data should be left to medically-trained personnel. Information provided to patient can be located elsewhere in the medical record under "Patient Instructions". Document created using STT-dictation technology, any transcriptional errors that may result from process are unintentional.    Patient: [name]  Service Category: E/M  Provider: Edward Jolly, MD  DOB: 1955/12/08  DOS: 04/09/2023  Referring Provider: Bethanie Dicker, NP  MRN: [id number]  Specialty: Interventional Pain Management  PCP: Bethanie Dicker, NP  Type: Established Patient  Setting: Ambulatory outpatient    Location: Office  Delivery: Face-to-face     HPI  [name], a 67 y.o. 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 [name] primary complain today is Hip Pain (right), Rectal Pain, and Pain (Right buttock)  Pertinent problems: [name] does not have any pertinent problems on file. Pain Assessment: Severity of Chronic pain is reported as a 9 /10. Location: Hip Right/right buttock. Onset: More than a month ago. Quality: Aching, Dull. Timing: Constant. Modifying factor(s): tylenol, lidocaine patches. Vitals:  height is 5\' 5"  (1.651 m) and weight is 165 lb (74.8 kg). Her temporal temperature is 98.1 F (36.7 C). Her blood pressure is 177/99 (abnormal) and her pulse is 70. Her respiration is 18 and oxygen saturation is 100%.  BMI: Estimated body mass index is 27.46 kg/m as calculated from the following:   Height as of this encounter: 5\' 5"  (1.651 m).   Weight as of this encounter: 165 lb (74.8 kg). Last encounter: 10/28/2022. Last procedure: 12/03/2022.  Reason for encounter: evaluation for possible interventional PM therapy/treatment.   Patient states that she is having increased right low back pain that also radiates  into her right buttock, right groin and down her right posterior lateral leg in a dermatomal fashion.  She states that she has a new puppy and has been having to go up and down her apartment stairs much more frequently and feels like she may have aggravated her low back and right SI joint.  Her previous sacroiliac joint injection with me was 11/12/2022 that provided her with 70% pain relief for 3 and half months.      ROS  Constitutional: Denies any fever or chills Gastrointestinal: No reported hemesis, hematochezia, vomiting, or acute GI distress Musculoskeletal:  As above Neurological: No reported episodes of acute onset apraxia, aphasia, dysarthria, agnosia, amnesia, paralysis, loss of coordination, or loss of consciousness  Medication Review  Aspirin, amLODipine, atorvastatin, levothyroxine, omeprazole, and traZODone  History Review  Allergy: [name] has No Known Allergies. Drug: [name]  reports no history of drug use. Alcohol:  reports current alcohol use of about 1.0 standard drink of alcohol per week. Tobacco:  reports that she has never smoked. She has never used smokeless tobacco. Social: [name]  reports that she has never smoked. She has never used smokeless tobacco. She reports current alcohol use of about 1.0 standard drink of alcohol per week. She reports that she does not use drugs. Medical:  has a past medical history of Anemia, Anxiety, Arthritis, Bronchitis, COVID-19 (07/08/2019), Depression, GERD (gastroesophageal reflux disease), Hypertension, Hypothyroidism, Mini stroke, Osteoporosis, PTSD (post-traumatic stress disorder), Stroke (HCC), and Wears glasses. Surgical: [name]  has a past surgical history that includes Back surgery; Breast surgery; Augmentation mammaplasty (Bilateral, 2006); Esophagogastroduodenoscopy (egd) with propofol (N/A, 10/24/2020); Esophagogastroduodenoscopy (egd) with propofol (N/A, 12/20/2020);  Esophageal manometry (N/A, 03/04/2021); 24 hour  ph study (N/A, 03/04/2021); PH impedance study (N/A, 03/04/2021); Vein Surgery (1991); Upper gastrointestinal endoscopy; Colonoscopy; Anterior cervical decomp/discectomy fusion (N/A, 05/12/2022); and Esophagogastroduodenoscopy (egd) with propofol (N/A, 11/11/2022). Family: family history includes Cancer in her father and sister; Colon cancer in her sister; Dementia in her mother; Diabetes in her father; Diabetes Mellitus I in her son; Heart Problems in her father; Heart disease in her father, mother, sister, and sister; Hypertension in her mother; Lung cancer in her maternal uncle; Osteoporosis in her mother; Ovarian cancer in her sister; Sickle cell trait in her daughter; Skin cancer in an other family member; Vitiligo in her mother and son.  Laboratory Chemistry Profile   Renal Lab Results  Component Value Date   BUN 23 11/14/2022   CREATININE 0.67 11/14/2022   GFR 79.41 11/27/2021   GFRAA >60 10/04/2019   GFRNONAA >60 11/14/2022    Hepatic Lab Results  Component Value Date   AST 26 11/14/2022   ALT 25 11/14/2022   ALBUMIN 4.2 11/14/2022   ALKPHOS 61 11/14/2022   LIPASE 56 (H) 11/14/2022    Electrolytes Lab Results  Component Value Date   NA 137 11/14/2022   K 3.8 11/14/2022   CL 102 11/14/2022   CALCIUM 9.3 11/14/2022    Bone Lab Results  Component Value Date   VD25OH 71.21 04/02/2021    Inflammation (CRP: Acute Phase) (ESR: Chronic Phase) No results found for: "CRP", "ESRSEDRATE", "LATICACIDVEN"       Note: Above Lab results reviewed.  Recent Imaging Review  MR SHOULDER RIGHT WO CONTRAST CLINICAL DATA:  Right shoulder pain radiating from the neck down into the shoulders.  EXAM: MRI OF THE RIGHT SHOULDER WITHOUT CONTRAST  TECHNIQUE: Multiplanar, multisequence MR imaging of the shoulder was performed. No intravenous contrast was administered.  COMPARISON:  None Available.  FINDINGS: Rotator cuff: Severe tendinosis of the supraspinatus tendon with a small  insertional interstitial tear and fraying along the anterior bursal surface. Mild tendinosis of the infraspinatus tendon. Teres minor tendon is intact. Subscapularis tendon is intact.  Muscles: No muscle atrophy or edema. No intramuscular fluid collection or hematoma.  Biceps Long Head: Moderate tendinosis of the intra-articular portion of the long head of the biceps tendon.  Acromioclavicular Joint: Moderate arthropathy of the acromioclavicular joint. Trace subacromial/subdeltoid bursal fluid.  Glenohumeral Joint: No joint effusion. No chondral defect.  Labrum: Posterosuperior labral degeneration.  Bones: No fracture or dislocation. No aggressive osseous lesion.  Other: No fluid collection or hematoma.  IMPRESSION: 1. Severe tendinosis of the supraspinatus tendon with a small insertional interstitial tear and fraying along the anterior bursal surface. 2. Mild tendinosis of the infraspinatus tendon. 3. Moderate tendinosis of the intra-articular portion of the long head of the biceps tendon.  Electronically Signed   By: Elige Ko M.D.   On: 12/31/2022 06:39 Note: Reviewed        Physical Exam  General appearance: Well nourished, well developed, and well hydrated. In no apparent acute distress Mental status: Alert, oriented x 3 (person, place, & time)       Respiratory: No evidence of acute respiratory distress Eyes: PERLA Vitals: BP (!) 177/99   Pulse 70   Temp 98.1 F (36.7 C) (Temporal)   Resp 18   Ht 5\' 5"  (1.651 m)   Wt 165 lb (74.8 kg)   SpO2 100%   BMI 27.46 kg/m  BMI: Estimated body mass index is 27.46 kg/m as calculated from the following:  Height as of this encounter: 5\' 5"  (1.651 m).   Weight as of this encounter: 165 lb (74.8 kg). Ideal: Ideal body weight: 57 kg (125 lb 10.6 oz) Adjusted ideal body weight: 64.1 kg (141 lb 6.4 oz)  Lumbar Spine Area Exam  Skin & Axial Inspection: No masses, redness, or swelling Alignment: Symmetrical Functional  ROM: Unrestricted ROM       Stability: No instability detected Muscle Tone/Strength: Functionally intact. No obvious neuro-muscular anomalies detected. Sensory (Neurological): Musculoskeletal pain pattern and dermatomal Palpation: No palpable anomalies       Provocative Tests: Hyperextension/rotation test: Due to pain     Lumbar quadrant test (Kemp's test): Positive for right foraminal stenosis     Lateral bending test: Positive for right foraminal stenosis    Patrick's Maneuver: (+) for right-sided S-I arthralgia             FABER* test: (+) for right-sided S-I arthralgia             S-I anterior distraction/compression test: (+)   S-I arthralgia/arthropathy S-I lateral compression test: Positive for   S-I arthralgia/arthropathy S-I Thigh-thrust test:Positive for   S-I arthralgia/arthropathy     S-I Gaenslen's test: Positive for   S-I arthralgia/arthropathy      Gait & Posture Assessment  Ambulation: Unassisted Gait: Relatively normal for age and body habitus Posture: WNL  Lower Extremity Exam      Side: Right lower extremity   Side: Left lower extremity  Stability: No instability observed           Stability: No instability observed          Skin & Extremity Inspection: Within normal limits   Skin & Extremity Inspection: Skin color, temperature, and hair growth are WNL. No peripheral edema or cyanosis. No masses, redness, swelling, asymmetry, or associated skin lesions. No contractures.  Functional ROM: Unrestricted range of motion           Functional ROM: Unrestricted ROM                  Muscle Tone/Strength: Functionally intact. No obvious neuro-muscular anomalies detected.   Muscle Tone/Strength: Functionally intact. No obvious neuro-muscular anomalies detected.  Sensory (Neurological): neurogenic    Sensory (Neurological): Unimpaired        DTR: Patellar: deferred today Achilles: deferred today Plantar: deferred today   DTR: Patellar: deferred today Achilles: deferred  today Plantar: deferred today  Palpation: No palpable anomalies   Palpation: No palpable anomalies    Assessment   Diagnosis Status  1. Chronic radicular lumbar pain (right L4)   2. SI joint arthritis   3. Chronic right SI joint pain   4. Piriformis syndrome of right side   5. Chronic pain syndrome    Having a Flare-up Having a Flare-up Having a Flare-up   Updated Problems: Problem  Chronic radicular lumbar pain (right L4)  Piriformis Syndrome of Right Side    Plan of Care  1. Chronic radicular lumbar pain (right L4) - Lumbar Epidural Injection; Future  2. SI joint arthritis - SACROILIAC JOINT INJECTION; Future  3. Chronic right SI joint pain - SACROILIAC JOINT INJECTION; Future  4. Piriformis syndrome of right side - TRIGGER POINT INJECTION; Future  5. Chronic pain syndrome - Lumbar Epidural Injection; Future - SACROILIAC JOINT INJECTION; Future - TRIGGER POINT INJECTION; Future    Orders:  Orders Placed This Encounter  Procedures   Lumbar Epidural Injection    Standing Status:   Future  Standing Expiration Date:   07/10/2023    Scheduling Instructions:     Procedure: Interlaminar Lumbar Epidural Steroid injection (LESI)            Laterality: Right L4-5     Sedation: Patient's choice.     Timeframe: ASAA    Order Specific Question:   Where will this procedure be performed?    Answer:   [facility]   SACROILIAC JOINT INJECTION    Standing Status:   Future    Standing Expiration Date:   07/10/2023    Scheduling Instructions:     Side: RIGHT     Sedation: Patient's choice.     Timeframe: ASAP    Order Specific Question:   Where will this procedure be performed?    Answer:   [facility]   TRIGGER POINT INJECTION    Area: Buttocks region (gluteal area) Indications: Piriformis muscle pain; Right (G57.01) piriformis-syndrome; piriformis muscle spasms (Y40.347). CPT code: 42595    Standing Status:   Future    Standing Expiration  Date:   04/08/2024    Scheduling Instructions:     Type: Myoneural block (TPI) of piriformis muscle.     Side:  RIGHT    Order Specific Question:   Where will this procedure be performed?    Answer:   [facility]   Follow-up plan:   Return in about 13 days (around 04/22/2023) for Right L4/5 ESi, RSIJ, Rpiriformis TPI, in clinic NS.      Right SI-J and Right Piriformis 04/17/21, 09/02/21. 11/12/22; right cervical ESI 12/03/2022     Recent Visits No visits were found meeting these conditions. Showing recent visits within past 90 days and meeting all other requirements Today's Visits Date Type Provider Dept  04/09/23 Office Visit Edward Jolly, MD [facility]  Showing today's visits and meeting all other requirements Future Appointments No visits were found meeting these conditions. Showing future appointments within next 90 days and meeting all other requirements  I discussed the assessment and treatment plan with the patient. The patient was provided an opportunity to ask questions and all were answered. The patient agreed with the plan and demonstrated an understanding of the instructions.  Patient advised to call back or seek an in-person evaluation if the symptoms or condition worsens.  Duration of encounter: .  Total time on encounter, as per AMA guidelines included both the face-to-face and non-face-to-face time personally spent by the physician and/or other qualified health care professional(s) on the day of the encounter (includes time in activities that require the physician or other qualified health care professional and does not include time in activities normally performed by clinical staff). Physician's time may include the following activities when performed: Preparing to see the patient (e.g., pre-charting review of records, searching for previously ordered imaging, lab work, and nerve conduction tests) Review of prior analgesic  pharmacotherapies. Reviewing PMP Interpreting ordered tests (e.g., lab work, imaging, nerve conduction tests) Performing post-procedure evaluations, including interpretation of diagnostic procedures Obtaining and/or reviewing separately obtained history Performing a medically appropriate examination and/or evaluation Counseling and educating the patient/family/caregiver Ordering medications, tests, or procedures Referring and communicating with other health care professionals (when not separately reported) Documenting clinical information in the electronic or other health record Independently interpreting results (not separately reported) and communicating results to the patient/ family/caregiver Care coordination (not separately reported)  Note by: Edward Jolly, MD Date: 04/09/2023; Time: 1:49 PM

## 2023-04-14 ENCOUNTER — Ambulatory Visit: Payer: Medicare Other | Admitting: Neurosurgery

## 2023-04-22 ENCOUNTER — Ambulatory Visit
Admission: RE | Admit: 2023-04-22 | Discharge: 2023-04-22 | Disposition: A | Discharge status: Home / Self Care | Payer: Medicare Other | Source: Ambulatory Visit | Attending: Student in an Organized Health Care Education/Training Program | Admitting: Student in an Organized Health Care Education/Training Program

## 2023-04-22 ENCOUNTER — Encounter: Payer: Self-pay | Admitting: Student in an Organized Health Care Education/Training Program

## 2023-04-22 ENCOUNTER — Ambulatory Visit
Payer: Medicare Other | Attending: Student in an Organized Health Care Education/Training Program | Admitting: Student in an Organized Health Care Education/Training Program

## 2023-04-22 VITALS — BP 138/102 | HR 71 | Temp 97.4°F | Resp 17 | Ht 65.0 in | Wt 166.0 lb

## 2023-04-22 DIAGNOSIS — M5416 Radiculopathy, lumbar region: Secondary | ICD-10-CM

## 2023-04-22 DIAGNOSIS — G5701 Lesion of sciatic nerve, right lower limb: Secondary | ICD-10-CM | POA: Diagnosis not present

## 2023-04-22 DIAGNOSIS — G8929 Other chronic pain: Secondary | ICD-10-CM

## 2023-04-22 DIAGNOSIS — M25551 Pain in right hip: Secondary | ICD-10-CM | POA: Diagnosis not present

## 2023-04-22 DIAGNOSIS — M533 Sacrococcygeal disorders, not elsewhere classified: Secondary | ICD-10-CM | POA: Diagnosis not present

## 2023-04-22 DIAGNOSIS — M461 Sacroiliitis, not elsewhere classified: Secondary | ICD-10-CM | POA: Insufficient documentation

## 2023-04-22 DIAGNOSIS — M47818 Spondylosis without myelopathy or radiculopathy, sacral and sacrococcygeal region: Secondary | ICD-10-CM | POA: Diagnosis not present

## 2023-04-22 DIAGNOSIS — G894 Chronic pain syndrome: Secondary | ICD-10-CM

## 2023-04-22 MED ORDER — ROPIVACAINE HCL 2 MG/ML IJ SOLN
INTRAMUSCULAR | Status: AC
  Filled 2023-04-22: qty 20

## 2023-04-22 MED ORDER — DEXAMETHASONE SODIUM PHOSPHATE 10 MG/ML IJ SOLN
10.0000 mg | Freq: Once | INTRAMUSCULAR | Status: AC
  Administered 2023-04-22: 10 mg

## 2023-04-22 MED ORDER — IOHEXOL 180 MG/ML  SOLN
INTRAMUSCULAR | Status: AC
  Filled 2023-04-22: qty 20

## 2023-04-22 MED ORDER — METHYLPREDNISOLONE ACETATE 80 MG/ML IJ SUSP
INTRAMUSCULAR | Status: AC
  Filled 2023-04-22: qty 1

## 2023-04-22 MED ORDER — DEXAMETHASONE SODIUM PHOSPHATE 10 MG/ML IJ SOLN
INTRAMUSCULAR | Status: AC
  Filled 2023-04-22: qty 1

## 2023-04-22 MED ORDER — LIDOCAINE HCL 2 % IJ SOLN
INTRAMUSCULAR | Status: AC
  Filled 2023-04-22: qty 20

## 2023-04-22 MED ORDER — ROPIVACAINE HCL 2 MG/ML IJ SOLN
9.0000 mL | Freq: Once | INTRAMUSCULAR | Status: AC
  Administered 2023-04-22: 9 mL via PERINEURAL

## 2023-04-22 MED ORDER — METHYLPREDNISOLONE ACETATE 80 MG/ML IJ SUSP
80.0000 mg | Freq: Once | INTRAMUSCULAR | Status: AC
  Administered 2023-04-22: 80 mg via INTRA_ARTICULAR

## 2023-04-22 MED ORDER — IOHEXOL 180 MG/ML  SOLN
10.0000 mL | Freq: Once | INTRAMUSCULAR | Status: AC
  Administered 2023-04-22: 10 mL via EPIDURAL

## 2023-04-22 MED ORDER — SODIUM CHLORIDE (PF) 0.9 % IJ SOLN
INTRAMUSCULAR | Status: AC
  Filled 2023-04-22: qty 10

## 2023-04-22 MED ORDER — LIDOCAINE HCL 2 % IJ SOLN
20.0000 mL | Freq: Once | INTRAMUSCULAR | Status: AC
  Administered 2023-04-22: 400 mg

## 2023-04-22 NOTE — Progress Notes (Signed)
PROVIDER NOTE: Information contained herein reflects review and annotations entered in association with encounter. Interpretation of such information and data should be left to medically-trained personnel. Information provided to patient can be located elsewhere in the medical record under "Patient Instructions". Document created using STT-dictation technology, any transcriptional errors that may result from process are unintentional.    Patient: [name]  Service Category: Procedure  Provider: Edward Jolly, MD  DOB: 11-24-1955  DOS: 04/22/2023  Location: [facility]  MRN: [id number]  Setting: Ambulatory - outpatient  Referring Provider: Edward Jolly, MD  Type: Established Patient  Specialty: Interventional Pain Management  PCP: Bethanie Dicker, NP   Primary Reason for Visit: Interventional Pain Management Treatment. CC: Hip Pain (right)   Procedure:          Anesthesia, Analgesia, Anxiolysis:  Type: Therapeutic Sacroiliac Joint Steroid Injection #4  & Right Piriformis Trigger point injection  under fluoroscopy #3  Region: Inferior Lumbosacral Region Level: PIIS (Posterior Inferior Iliac Spine) Laterality: Right-Side  Type: Local Anesthesia  Local Anesthetic: Lidocaine 1-2%  Position: Prone           Indications: SI-J arthritis, piriformis syndrome  Pain Score: Pre-procedure: 9 /10 Post-procedure: 0-No pain/10   Pre-op H&P Assessment:  [name] is a 67 y.o. (year old), female patient, seen today for interventional treatment. She  has a past surgical history that includes Back surgery; Breast surgery; Augmentation mammaplasty (Bilateral, 2006); Esophagogastroduodenoscopy (egd) with propofol (N/A, 10/24/2020); Esophagogastroduodenoscopy (egd) with propofol (N/A, 12/20/2020); Esophageal manometry (N/A, 03/04/2021); 24 hour ph study (N/A, 03/04/2021); PH impedance study (N/A, 03/04/2021); Vein Surgery (1991); Upper gastrointestinal endoscopy; Colonoscopy; Anterior  cervical decomp/discectomy fusion (N/A, 05/12/2022); and Esophagogastroduodenoscopy (egd) with propofol (N/A, 11/11/2022). [name] has a current medication list which includes the following prescription(s): amlodipine, aspirin, atorvastatin, levothyroxine, omeprazole, and [DISCONTINUED] trazodone, and the following Facility-Administered Medications: aspirin. Her primarily concern today is the Hip Pain (right)   Initial Vital Signs:  Pulse/HCG Rate: 71ECG Heart Rate: 68 Temp: (!) 97.4 F (36.3 C) Resp: 20 BP: (!) 115/96 SpO2: 99 %  BMI: Estimated body mass index is 27.62 kg/m as calculated from the following:   Height as of this encounter: 5\' 5"  (1.651 m).   Weight as of this encounter: 166 lb (75.3 kg).  Risk Assessment: Allergies: Reviewed. She has No Known Allergies.  Allergy Precautions: None required Coagulopathies: Reviewed. None identified.  Blood-thinner therapy: None at this time Active Infection(s): Reviewed. None identified. [name] is afebrile  Site Confirmation: [name] was asked to confirm the procedure and laterality before marking the site Procedure checklist: Completed Consent: Before the procedure and under the influence of no sedative(s), amnesic(s), or anxiolytics, the patient was informed of the treatment options, risks and possible complications. To fulfill our ethical and legal obligations, as recommended by the American Medical Association's Code of Ethics, I have informed the patient of my clinical impression; the nature and purpose of the treatment or procedure; the risks, benefits, and possible complications of the intervention; the alternatives, including doing nothing; the risk(s) and benefit(s) of the alternative treatment(s) or procedure(s); and the risk(s) and benefit(s) of doing nothing. The patient was provided information about the general risks and possible complications associated with the procedure. These may include, but are not limited to:  failure to achieve desired goals, infection, bleeding, organ or nerve damage, allergic reactions, paralysis, and death. In addition, the patient was informed of those risks and complications associated to the procedure, such as failure to  decrease pain; infection; bleeding; organ or nerve damage with subsequent damage to sensory, motor, and/or autonomic systems, resulting in permanent pain, numbness, and/or weakness of one or several areas of the body; allergic reactions; (i.e.: anaphylactic reaction); and/or death. Furthermore, the patient was informed of those risks and complications associated with the medications. These include, but are not limited to: allergic reactions (i.e.: anaphylactic or anaphylactoid reaction(s)); adrenal axis suppression; blood sugar elevation that in diabetics may result in ketoacidosis or comma; water retention that in patients with history of congestive heart failure may result in shortness of breath, pulmonary edema, and decompensation with resultant heart failure; weight gain; swelling or edema; medication-induced neural toxicity; particulate matter embolism and blood vessel occlusion with resultant organ, and/or nervous system infarction; and/or aseptic necrosis of one or more joints. Finally, the patient was informed that Medicine is not an exact science; therefore, there is also the possibility of unforeseen or unpredictable risks and/or possible complications that may result in a catastrophic outcome. The patient indicated having understood very clearly. We have given the patient no guarantees and we have made no promises. Enough time was given to the patient to ask questions, all of which were answered to the patient's satisfaction. [name] has indicated that she wanted to continue with the procedure. Attestation: I, the ordering provider, attest that I have discussed with the patient the benefits, risks, side-effects, alternatives, likelihood of achieving goals, and  potential problems during recovery for the procedure that I have provided informed consent. Date  Time: 04/22/2023  1:32 PM  Pre-Procedure Preparation:  Monitoring: As per clinic protocol. Respiration, ETCO2, SpO2, BP, heart rate and rhythm monitor placed and checked for adequate function Safety Precautions: Patient was assessed for positional comfort and pressure points before starting the procedure. Time-out: I initiated and conducted the "Time-out" before starting the procedure, as per protocol. The patient was asked to participate by confirming the accuracy of the "Time Out" information. Verification of the correct person, site, and procedure were performed and confirmed by me, the nursing staff, and the patient. "Time-out" conducted as per Joint Commission's Universal Protocol (UP.01.01.01). Time: 1402  Description of Procedure:          Target Area: Inferior, posterior, aspect of the sacroiliac fissure Approach: Posterior, paraspinal, ipsilateral approach. Area Prepped: Entire Lower Lumbosacral Region DuraPrep (Iodine Povacrylex [0.7% available iodine] and Isopropyl Alcohol, 74% w/w) Safety Precautions: Aspiration looking for blood return was conducted prior to all injections. At no point did we inject any substances, as a needle was being advanced. No attempts were made at seeking any paresthesias. Safe injection practices and needle disposal techniques used. Medications properly checked for expiration dates. SDV (single dose vial) medications used. Description of the Procedure: Protocol guidelines were followed. The patient was placed in position over the procedure table. The target area was identified and the area prepped in the usual manner. Skin & deeper tissues infiltrated with local anesthetic. Appropriate amount of time allowed to pass for local anesthetics to take effect. The procedure needle was advanced under fluoroscopic guidance into the sacroiliac joint until a firm endpoint was  obtained. Proper needle placement secured. Negative aspiration confirmed. Solution injected in intermittent fashion, asking for systemic symptoms every 0.5cc of injectate. The needles were then removed and the area cleansed, making sure to leave some of the prepping solution back to take advantage of its long term bactericidal properties. Vitals:   04/22/23 1335 04/22/23 1359 04/22/23 1404 04/22/23 1409  BP: (!) 115/96 (!) 134/98 Marland Kitchen)  146/135 (!) 138/102  Pulse: 71     Resp:  20 20 17   Temp: (!) 97.4 F (36.3 C)     SpO2: 99% 100% 100% 100%  Weight: 166 lb (75.3 kg)     Height: 5\' 5"  (1.651 m)       Start Time: 1402 hrs. End Time: 1412 hrs. Materials:  Needle(s) Type: Spinal Needle Gauge: 25G Length: 3.5-in Medication(s): Please see orders for medications and dosing details. 5 cc solution made of 4.5 cc of 0.2% ropivacaine, 0.5 cc of methylprednisolone, 80 mg/cc.  Injected into the right SI joint after contrast confirmation.  A right piriformis trigger point injection was also performed under fluoroscopy.  1 cm deep, 1 cm inferior and 1 cm lateral to the inferior SI joint fissure, the piriformis muscle was injected with contrast, muscle striations confirmed, no pain or paresthesias down right lower extremity.  5 cc solution made of 4.5 cc of 0.2% ropivacaine, 0.5 cc of methylprednisolone, 80 mg/cc was injected into the right piriformis muscle.  Imaging Guidance (Non-Spinal):          Type of Imaging Technique: Fluoroscopy Guidance (Non-Spinal) Indication(s): Assistance in needle guidance and placement for procedures requiring needle placement in or near specific anatomical locations not easily accessible without such assistance. Exposure Time: Please see nurses notes. Contrast: Before injecting any contrast, we confirmed that the patient did not have an allergy to iodine, shellfish, or radiological contrast. Once satisfactory needle placement was completed at the desired level,  radiological contrast was injected. Contrast injected under live fluoroscopy. No contrast complications. See chart for type and volume of contrast used. Fluoroscopic Guidance: I was personally present during the use of fluoroscopy. "Tunnel Vision Technique" used to obtain the best possible view of the target area. Parallax error corrected before commencing the procedure. "Direction-depth-direction" technique used to introduce the needle under continuous pulsed fluoroscopy. Once target was reached, antero-posterior, oblique, and lateral fluoroscopic projection used confirm needle placement in all planes. Images permanently stored in EMR. Interpretation: I personally interpreted the imaging intraoperatively. Adequate needle placement confirmed in multiple planes. Appropriate spread of contrast into desired area was observed. No evidence of afferent or efferent intravascular uptake. Permanent images saved into the patient's record.   Post-operative Assessment:  Post-procedure Vital Signs:  Pulse/HCG Rate: 7161 Temp: (!) 97.4 F (36.3 C) Resp: 17 BP: (!) 138/102 SpO2: 100 %  EBL: None  Complications: No immediate post-treatment complications observed by team, or reported by patient.  Note: The patient tolerated the entire procedure well. A repeat set of vitals were taken after the procedure and the patient was kept under observation following institutional policy, for this type of procedure. Post-procedural neurological assessment was performed, showing return to baseline, prior to discharge. The patient was provided with post-procedure discharge instructions, including a section on how to identify potential problems. Should any problems arise concerning this procedure, the patient was given instructions to immediately contact us, at any time, without hesitation. In any case, we plan to contact the patient by telephone for a follow-up status report regarding this interventional procedure.  Comments:   No additional relevant information.  Plan of Care   Orders:  Orders Placed This Encounter  Procedures   DG PAIN CLINIC C-ARM 1-60 MIN NO REPORT    Intraoperative interpretation by procedural physician at [facility].    Standing Status:   Standing    Number of Occurrences:   1    Order Specific Question:   Reason for exam:  Answer:   Assistance in needle guidance and placement for procedures requiring needle placement in or near specific anatomical locations not easily accessible without such assistance.      Medications ordered for procedure: Meds ordered this encounter  Medications   iohexol (OMNIPAQUE) 180 MG/ML injection 10 mL    Must be Myelogram-compatible. If not available, you may substitute with a water-soluble, non-ionic, hypoallergenic, myelogram-compatible radiological contrast medium.   lidocaine (XYLOCAINE) 2 % (with pres) injection 400 mg   dexamethasone (DECADRON) injection 10 mg   ropivacaine (PF) 2 mg/mL (0.2%) (NAROPIN) injection 9 mL   methylPREDNISolone acetate (DEPO-MEDROL) injection 80 mg    Medications administered: We administered iohexol, lidocaine, dexamethasone, ropivacaine (PF) 2 mg/mL (0.2%), and methylPREDNISolone acetate.  See the medical record for exact dosing, route, and time of administration.  Follow-up plan:   Return in about 5 weeks (around 05/27/2023) for Post Procedure Evaluation, in person.     Right SI-J and Right Piriformis 04/17/21, 09/02/21. 11/12/22    Recent Visits Date Type Provider Dept  04/09/23 Office Visit Edward Jolly, MD [facility]  Showing recent visits within past 90 days and meeting all other requirements Today's Visits Date Type Provider Dept  04/22/23 Procedure visit Edward Jolly, MD [facility]  Showing today's visits and meeting all other requirements Future Appointments Date Type Provider Dept  06/01/23 Appointment Edward Jolly, MD [facility]  Showing future  appointments within next 90 days and meeting all other requirements  Disposition: Discharge home  Discharge (Date  Time): 04/22/2023; 1419 hrs.   Primary Care Physician: Bethanie Dicker, NP Location: [facility] Note by: Edward Jolly, MD Date: 04/22/2023; Time: 2:22 PM  Disclaimer:  Medicine is not an exact science. The only guarantee in medicine is that nothing is guaranteed. It is important to note that the decision to proceed with this intervention was based on the information collected from the patient. The Data and conclusions were drawn from the patient's questionnaire, the interview, and the physical examination. Because the information was provided in large part by the patient, it cannot be guaranteed that it has not been purposely or unconsciously manipulated. Every effort has been made to obtain as much relevant data as possible for this evaluation. It is important to note that the conclusions that lead to this procedure are derived in large part from the available data. Always take into account that the treatment will also be dependent on availability of resources and existing treatment guidelines, considered by other Pain Management Practitioners as being common knowledge and practice, at the time of the intervention. For Medico-Legal purposes, it is also important to point out that variation in procedural techniques and pharmacological choices are the acceptable norm. The indications, contraindications, technique, and results of the above procedure should only be interpreted and judged by a Board-Certified Interventional Pain Specialist with extensive familiarity and expertise in the same exact procedure and technique.

## 2023-04-22 NOTE — Progress Notes (Signed)
Safety precautions to be maintained throughout the outpatient stay will include: orient to surroundings, keep bed in low position, maintain call bell within reach at all times, provide assistance with transfer out of bed and ambulation.  

## 2023-04-22 NOTE — Patient Instructions (Signed)

## 2023-04-22 NOTE — Progress Notes (Signed)
PROVIDER NOTE: Interpretation of information contained herein should be left to medically-trained personnel. Specific patient instructions are provided elsewhere under "Patient Instructions" section of medical record. This document was created in part using STT-dictation technology, any transcriptional errors that may result from this process are unintentional.  Patient: [name] Type: Established DOB: [birth year] MRN: [id number] PCP: Bethanie Dicker, NP  Service: Procedure DOS: 04/22/2023 Setting: Ambulatory Location: Ambulatory outpatient facility Delivery: Face-to-face Provider: Edward Jolly, MD Specialty: Interventional Pain Management Specialty designation: 09 Location: Outpatient facility Ref. Prov.: Edward Jolly, MD       Interventional Therapy   Procedure: Lumbar epidural steroid injection (LESI) (interlaminar) #1    Laterality: Right   Level:  L4-5 Level.  Imaging: Fluoroscopic guidance         Anesthesia: Local anesthesia (1-2% Lidocaine) DOS: 04/22/2023  Performed by: Edward Jolly, MD  Purpose: Diagnostic/Therapeutic Indications: Lumbar radicular pain of intraspinal etiology of more than 4 weeks that has failed to respond to conservative therapy and is severe enough to impact quality of life or function.  NAS-11 Pain score:   Pre-procedure: 9 /10   Post-procedure: 0-No pain/10      Position / Prep / Materials:  Position: Prone w/ head of the table raised (slight reverse trendelenburg) to facilitate breathing.  Prep solution: DuraPrep (Iodine Povacrylex [0.7% available iodine] and Isopropyl Alcohol, 74% w/w) Prep Area: Entire Posterior Lumbar Region from lower scapular tip down to mid buttocks area and from flank to flank. Materials:  Tray: Epidural tray Needle(s):  Type: Epidural needle (Tuohy) Gauge (G):  22 Length: Regular (3.5-in) Qty: 1   H&P (Pre-op Assessment):  [name] is a 67 y.o. (year old), female patient, seen today for interventional treatment.  She  has a past surgical history that includes Back surgery; Breast surgery; Augmentation mammaplasty (Bilateral, 2006); Esophagogastroduodenoscopy (egd) with propofol (N/A, 10/24/2020); Esophagogastroduodenoscopy (egd) with propofol (N/A, 12/20/2020); Esophageal manometry (N/A, 03/04/2021); 24 hour ph study (N/A, 03/04/2021); PH impedance study (N/A, 03/04/2021); Vein Surgery (1991); Upper gastrointestinal endoscopy; Colonoscopy; Anterior cervical decomp/discectomy fusion (N/A, 05/12/2022); and Esophagogastroduodenoscopy (egd) with propofol (N/A, 11/11/2022). [name] has a current medication list which includes the following prescription(s): amlodipine, aspirin, atorvastatin, levothyroxine, omeprazole, and [DISCONTINUED] trazodone, and the following Facility-Administered Medications: aspirin. Her primarily concern today is the Hip Pain (right)  Initial Vital Signs:  Pulse/HCG Rate: 71ECG Heart Rate: 68 Temp: (!) 97.4 F (36.3 C) Resp: 20 BP: (!) 115/96 SpO2: 99 %  BMI: Estimated body mass index is 27.62 kg/m as calculated from the following:   Height as of this encounter: 5\' 5"  (1.651 m).   Weight as of this encounter: 166 lb (75.3 kg).  Risk Assessment: Allergies: Reviewed. She has No Known Allergies.  Allergy Precautions: None required Coagulopathies: Reviewed. None identified.  Blood-thinner therapy: None at this time Active Infection(s): Reviewed. None identified. [name] is afebrile  Site Confirmation: [name] was asked to confirm the procedure and laterality before marking the site Procedure checklist: Completed Consent: Before the procedure and under the influence of no sedative(s), amnesic(s), or anxiolytics, the patient was informed of the treatment options, risks and possible complications. To fulfill our ethical and legal obligations, as recommended by the American Medical Association's Code of Ethics, I have informed the patient of my clinical impression; the nature and  purpose of the treatment or procedure; the risks, benefits, and possible complications of the intervention; the alternatives, including doing nothing; the risk(s) and benefit(s) of the alternative treatment(s) or procedure(s); and the risk(s)  and benefit(s) of doing nothing. The patient was provided information about the general risks and possible complications associated with the procedure. These may include, but are not limited to: failure to achieve desired goals, infection, bleeding, organ or nerve damage, allergic reactions, paralysis, and death. In addition, the patient was informed of those risks and complications associated to Spine-related procedures, such as failure to decrease pain; infection (i.e.: Meningitis, epidural or intraspinal abscess); bleeding (i.e.: epidural hematoma, subarachnoid hemorrhage, or any other type of intraspinal or peri-dural bleeding); organ or nerve damage (i.e.: Any type of peripheral nerve, nerve root, or spinal cord injury) with subsequent damage to sensory, motor, and/or autonomic systems, resulting in permanent pain, numbness, and/or weakness of one or several areas of the body; allergic reactions; (i.e.: anaphylactic reaction); and/or death. Furthermore, the patient was informed of those risks and complications associated with the medications. These include, but are not limited to: allergic reactions (i.e.: anaphylactic or anaphylactoid reaction(s)); adrenal axis suppression; blood sugar elevation that in diabetics may result in ketoacidosis or comma; water retention that in patients with history of congestive heart failure may result in shortness of breath, pulmonary edema, and decompensation with resultant heart failure; weight gain; swelling or edema; medication-induced neural toxicity; particulate matter embolism and blood vessel occlusion with resultant organ, and/or nervous system infarction; and/or aseptic necrosis of one or more joints. Finally, the patient was  informed that Medicine is not an exact science; therefore, there is also the possibility of unforeseen or unpredictable risks and/or possible complications that may result in a catastrophic outcome. The patient indicated having understood very clearly. We have given the patient no guarantees and we have made no promises. Enough time was given to the patient to ask questions, all of which were answered to the patient's satisfaction. Ms. Pinos has indicated that she wanted to continue with the procedure. Attestation: I, the ordering provider, attest that I have discussed with the patient the benefits, risks, side-effects, alternatives, likelihood of achieving goals, and potential problems during recovery for the procedure that I have provided informed consent. Date  Time: 04/22/2023  1:32 PM   Pre-Procedure Preparation:  Monitoring: As per clinic protocol. Respiration, ETCO2, SpO2, BP, heart rate and rhythm monitor placed and checked for adequate function Safety Precautions: Patient was assessed for positional comfort and pressure points before starting the procedure. Time-out: I initiated and conducted the "Time-out" before starting the procedure, as per protocol. The patient was asked to participate by confirming the accuracy of the "Time Out" information. Verification of the correct person, site, and procedure were performed and confirmed by me, the nursing staff, and the patient. "Time-out" conducted as per Joint Commission's Universal Protocol (UP.01.01.01). Time: 1402 Start Time: 1402 hrs.  Description/Narrative of Procedure:          Target: Epidural space via interlaminar opening, initially targeting the lower laminar border of the superior vertebral body. Region: Lumbar Approach: Percutaneous paravertebral  Rationale (medical necessity): procedure needed and proper for the diagnosis and/or treatment of the patient's medical symptoms and needs. Procedural Technique Safety Precautions:  Aspiration looking for blood return was conducted prior to all injections. At no point did we inject any substances, as a needle was being advanced. No attempts were made at seeking any paresthesias. Safe injection practices and needle disposal techniques used. Medications properly checked for expiration dates. SDV (single dose vial) medications used. Description of the Procedure: Protocol guidelines were followed. The procedure needle was introduced through the skin, ipsilateral to the reported pain, and  advanced to the target area. Bone was contacted and the needle walked caudad, until the lamina was cleared. The epidural space was identified using "loss-of-resistance technique" with 2-3 ml of PF-NaCl (0.9% NSS), in a 5cc LOR glass syringe.  5 cc solution made of 2 cc of preservative-free saline, 2 cc of 0.2% ropivacaine, 1 cc of Decadron 10 mg/cc.   Vitals:   04/22/23 1335 04/22/23 1359 04/22/23 1404 04/22/23 1409  BP: (!) 115/96 (!) 134/98 (!) 146/135 (!) 138/102  Pulse: 71     Resp:  20 20 17   Temp: (!) 97.4 F (36.3 C)     SpO2: 99% 100% 100% 100%  Weight: 166 lb (75.3 kg)     Height: 5\' 5"  (1.651 m)       Start Time: 1402 hrs. End Time: 1412 hrs.  Imaging Guidance (Spinal):          Type of Imaging Technique: Fluoroscopy Guidance (Spinal) Indication(s): Assistance in needle guidance and placement for procedures requiring needle placement in or near specific anatomical locations not easily accessible without such assistance. Exposure Time: Please see nurses notes. Contrast: Before injecting any contrast, we confirmed that the patient did not have an allergy to iodine, shellfish, or radiological contrast. Once satisfactory needle placement was completed at the desired level, radiological contrast was injected. Contrast injected under live fluoroscopy. No contrast complications. See chart for type and volume of contrast used. Fluoroscopic Guidance: I was personally present during the  use of fluoroscopy. "Tunnel Vision Technique" used to obtain the best possible view of the target area. Parallax error corrected before commencing the procedure. "Direction-depth-direction" technique used to introduce the needle under continuous pulsed fluoroscopy. Once target was reached, antero-posterior, oblique, and lateral fluoroscopic projection used confirm needle placement in all planes. Images permanently stored in EMR. Interpretation: I personally interpreted the imaging intraoperatively. Adequate needle placement confirmed in multiple planes. Appropriate spread of contrast into desired area was observed. No evidence of afferent or efferent intravascular uptake. No intrathecal or subarachnoid spread observed. Permanent images saved into the patient's record.  Antibiotic Prophylaxis:   Anti-infectives (From admission, onward)    None      Indication(s): None identified  Post-operative Assessment:  Post-procedure Vital Signs:  Pulse/HCG Rate: 7161 Temp: (!) 97.4 F (36.3 C) Resp: 17 BP: (!) 138/102 SpO2: 100 %  EBL: None  Complications: No immediate post-treatment complications observed by team, or reported by patient.  Note: The patient tolerated the entire procedure well. A repeat set of vitals were taken after the procedure and the patient was kept under observation following institutional policy, for this type of procedure. Post-procedural neurological assessment was performed, showing return to baseline, prior to discharge. The patient was provided with post-procedure discharge instructions, including a section on how to identify potential problems. Should any problems arise concerning this procedure, the patient was given instructions to immediately contact us, at any time, without hesitation. In any case, we plan to contact the patient by telephone for a follow-up status report regarding this interventional procedure.  Comments:  No additional relevant information.  Plan  of Care (POC)  Orders:  Orders Placed This Encounter  Procedures   DG PAIN CLINIC C-ARM 1-60 MIN NO REPORT    Intraoperative interpretation by procedural physician at [facility].    Standing Status:   Standing    Number of Occurrences:   1    Order Specific Question:   Reason for exam:    Answer:   Assistance in needle  guidance and placement for procedures requiring needle placement in or near specific anatomical locations not easily accessible without such assistance.    Medications ordered for procedure: Meds ordered this encounter  Medications   iohexol (OMNIPAQUE) 180 MG/ML injection 10 mL    Must be Myelogram-compatible. If not available, you may substitute with a water-soluble, non-ionic, hypoallergenic, myelogram-compatible radiological contrast medium.   lidocaine (XYLOCAINE) 2 % (with pres) injection 400 mg   dexamethasone (DECADRON) injection 10 mg   ropivacaine (PF) 2 mg/mL (0.2%) (NAROPIN) injection 9 mL   methylPREDNISolone acetate (DEPO-MEDROL) injection 80 mg   Medications administered: We administered iohexol, lidocaine, dexamethasone, ropivacaine (PF) 2 mg/mL (0.2%), and methylPREDNISolone acetate.  See the medical record for exact dosing, route, and time of administration.  Follow-up plan:   Return in about 5 weeks (around 05/27/2023) for Post Procedure Evaluation, in person.       Right SI-J and Right Piriformis 04/17/21, 09/02/21. 11/12/22; right cervical ESI 12/03/2022      Recent Visits Date Type Provider Dept  04/09/23 Office Visit Edward Jolly, MD [facility]  Showing recent visits within past 90 days and meeting all other requirements Today's Visits Date Type Provider Dept  04/22/23 Procedure visit Edward Jolly, MD [facility]  Showing today's visits and meeting all other requirements Future Appointments Date Type Provider Dept  06/01/23 Appointment Edward Jolly, MD [facility]  Showing future appointments  within next 90 days and meeting all other requirements  Disposition: Discharge home  Discharge (Date  Time): 04/22/2023; 1419 hrs.   Primary Care Physician: Bethanie Dicker, NP Location: [facility] Note by: Edward Jolly, MD (TTS technology used. I apologize for any typographical errors that were not detected and corrected.) Date: 04/22/2023; Time: 2:23 PM  Disclaimer:  Medicine is not an Visual merchandiser. The only guarantee in medicine is that nothing is guaranteed. It is important to note that the decision to proceed with this intervention was based on the information collected from the patient. The Data and conclusions were drawn from the patient's questionnaire, the interview, and the physical examination. Because the information was provided in large part by the patient, it cannot be guaranteed that it has not been purposely or unconsciously manipulated. Every effort has been made to obtain as much relevant data as possible for this evaluation. It is important to note that the conclusions that lead to this procedure are derived in large part from the available data. Always take into account that the treatment will also be dependent on availability of resources and existing treatment guidelines, considered by other Pain Management Practitioners as being common knowledge and practice, at the time of the intervention. For Medico-Legal purposes, it is also important to point out that variation in procedural techniques and pharmacological choices are the acceptable norm. The indications, contraindications, technique, and results of the above procedure should only be interpreted and judged by a Board-Certified Interventional Pain Specialist with extensive familiarity and expertise in the same exact procedure and technique.

## 2023-04-23 ENCOUNTER — Telehealth: Payer: Self-pay | Admitting: *Deleted

## 2023-04-23 NOTE — Telephone Encounter (Signed)
Post procedure call;  voicemail left.   

## 2023-04-24 ENCOUNTER — Telehealth: Payer: Self-pay | Admitting: Gastroenterology

## 2023-04-24 NOTE — Telephone Encounter (Signed)
Yes I am happy to see her back in the office.  Thanks

## 2023-04-24 NOTE — Telephone Encounter (Signed)
Hi Dr. Adela Lank,  We received a MyChart message from this patient requesting to come back to our office.  She had been seen in the past by you, but earlier this year had gone to [facility] and had an EGD done (these records are in [facility]).  She said she "was getting nowhere" there and wanted to come back here.  Please let me know how you would like me to proceed.  Thank you.

## 2023-05-06 ENCOUNTER — Ambulatory Visit: Payer: Medicare Other | Admitting: Nurse Practitioner

## 2023-05-07 ENCOUNTER — Encounter (INDEPENDENT_AMBULATORY_CARE_PROVIDER_SITE_OTHER): Payer: Self-pay

## 2023-06-01 ENCOUNTER — Encounter: Payer: Self-pay | Admitting: Student in an Organized Health Care Education/Training Program

## 2023-06-01 ENCOUNTER — Ambulatory Visit
Payer: Medicare Other | Attending: Student in an Organized Health Care Education/Training Program | Admitting: Student in an Organized Health Care Education/Training Program

## 2023-06-01 VITALS — BP 128/99 | HR 72 | Temp 97.4°F | Resp 16 | Ht 65.0 in | Wt 167.0 lb

## 2023-06-01 DIAGNOSIS — G5711 Meralgia paresthetica, right lower limb: Secondary | ICD-10-CM | POA: Diagnosis not present

## 2023-06-01 DIAGNOSIS — M79651 Pain in right thigh: Secondary | ICD-10-CM | POA: Insufficient documentation

## 2023-06-01 NOTE — Patient Instructions (Signed)

## 2023-06-01 NOTE — Progress Notes (Signed)
Safety precautions to be maintained throughout the outpatient stay will include: orient to surroundings, keep bed in low position, maintain call bell within reach at all times, provide assistance with transfer out of bed and ambulation.  

## 2023-06-01 NOTE — Progress Notes (Signed)
PROVIDER NOTE: Information contained herein reflects review and annotations entered in association with encounter. Interpretation of such information and data should be left to medically-trained personnel. Information provided to patient can be located elsewhere in the medical record under "Patient Instructions". Document created using STT-dictation technology, any transcriptional errors that may result from process are unintentional.    Patient: [name]  Service Category: E/M  Provider: Edward Jolly, MD  DOB: 1956/07/24  DOS: 06/01/2023  Referring Provider: Bethanie Dicker, NP  MRN: [id number]  Specialty: Interventional Pain Management  PCP: Bethanie Dicker, NP  Type: Established Patient  Setting: Ambulatory outpatient    Location: Office  Delivery: Face-to-face     HPI  [name], a 67 y.o. year old female, is here today because of her Right thigh pain [M79.651]. [name] primary complain today is right hip pain and right anterior thigh pain Back Pain (lower)  Pertinent problems: [name] does not have any pertinent problems on file. Pain Assessment: Severity of Chronic pain is reported as a 8 /10. Location: Back (0/100, hip 8/10 right)  /hip pain radiates down side of leg to ankle. Onset: More than a month ago. Quality: Aching, Discomfort, Constant, Jabbing, Stabbing. Timing: Constant. Modifying factor(s): nothing. Vitals:  height is 5\' 5"  (1.651 m) and weight is 167 lb (75.8 kg). Her temperature is 97.4 F (36.3 C) (abnormal). Her blood pressure is 128/99 (abnormal) and her pulse is 72. Her respiration is 16 and oxygen saturation is 100%.  BMI: Estimated body mass index is 27.79 kg/m as calculated from the following:   Height as of this encounter: 5\' 5"  (1.651 m).   Weight as of this encounter: 167 lb (75.8 kg). Last encounter: 04/09/2023. Last procedure: 04/22/2023.  Reason for encounter: post-procedure evaluation and assessment.  Also increased right anterior thigh pain and  intermittent tingling after fall   Post-procedure evaluation   Procedure: Lumbar epidural steroid injection (LESI) (interlaminar) #1    Laterality: Right   Level:  L4-5 Level.  Imaging: Fluoroscopic guidance         Anesthesia: Local anesthesia (1-2% Lidocaine) DOS: 04/22/2023  Performed by: Edward Jolly, MD  Purpose: Diagnostic/Therapeutic Indications: Lumbar radicular pain of intraspinal etiology of more than 4 weeks that has failed to respond to conservative therapy and is severe enough to impact quality of life or function.  NAS-11 Pain score:   Pre-procedure: 9 /10   Post-procedure: 0-No pain/10   Procedure #2 right SI joint and piriformis injection     Effectiveness:  Initial hour after procedure: 100 %  Subsequent 4-6 hours post-procedure: 100 %  Analgesia past initial 6 hours: 80 % (current)  Ongoing improvement:  Analgesic:  80% Function: [name] reports improvement in function ROM: [name] reports improvement in ROM   ROS  Constitutional: Denies any fever or chills Gastrointestinal: No reported hemesis, hematochezia, vomiting, or acute GI distress Musculoskeletal: Denies any acute onset joint swelling, redness, loss of ROM, or weakness Neurological:  Right anterior thigh pain and tingling  Medication Review  Aspirin, amLODipine, atorvastatin, levothyroxine, omeprazole, and traZODone  History Review  Allergy: [name] has No Known Allergies. Drug: [name]  reports no history of drug use. Alcohol:  reports current alcohol use of about 1.0 standard drink of alcohol per week. Tobacco:  reports that she has never smoked. She has never used smokeless tobacco. Social: [name]  reports that she has never smoked. She has never used smokeless tobacco. She reports current alcohol use  of about 1.0 standard drink of alcohol per week. She reports that she does not use drugs. Medical:  has a past medical history of Anemia, Anxiety, Arthritis, Bronchitis,  COVID-19 (07/08/2019), Depression, GERD (gastroesophageal reflux disease), Hypertension, Hypothyroidism, Mini stroke, Osteoporosis, PTSD (post-traumatic stress disorder), Stroke (HCC), and Wears glasses. Surgical: [name]  has a past surgical history that includes Back surgery; Breast surgery; Augmentation mammaplasty (Bilateral, 2006); Esophagogastroduodenoscopy (egd) with propofol (N/A, 10/24/2020); Esophagogastroduodenoscopy (egd) with propofol (N/A, 12/20/2020); Esophageal manometry (N/A, 03/04/2021); 24 hour ph study (N/A, 03/04/2021); PH impedance study (N/A, 03/04/2021); Vein Surgery (1991); Upper gastrointestinal endoscopy; Colonoscopy; Anterior cervical decomp/discectomy fusion (N/A, 05/12/2022); and Esophagogastroduodenoscopy (egd) with propofol (N/A, 11/11/2022). Family: family history includes Cancer in her father and sister; Colon cancer in her sister; Dementia in her mother; Diabetes in her father; Diabetes Mellitus I in her son; Heart Problems in her father; Heart disease in her father, mother, sister, and sister; Hypertension in her mother; Lung cancer in her maternal uncle; Osteoporosis in her mother; Ovarian cancer in her sister; Sickle cell trait in her daughter; Skin cancer in an other family member; Vitiligo in her mother and son.  Laboratory Chemistry Profile   Renal Lab Results  Component Value Date   BUN 23 11/14/2022   CREATININE 0.67 11/14/2022   GFR 79.41 11/27/2021   GFRAA >60 10/04/2019   GFRNONAA >60 11/14/2022    Hepatic Lab Results  Component Value Date   AST 26 11/14/2022   ALT 25 11/14/2022   ALBUMIN 4.2 11/14/2022   ALKPHOS 61 11/14/2022   LIPASE 56 (H) 11/14/2022    Electrolytes Lab Results  Component Value Date   NA 137 11/14/2022   K 3.8 11/14/2022   CL 102 11/14/2022   CALCIUM 9.3 11/14/2022    Bone Lab Results  Component Value Date   VD25OH 71.21 04/02/2021    Inflammation (CRP: Acute Phase) (ESR: Chronic Phase) No results found for: "CRP",  "ESRSEDRATE", "LATICACIDVEN"       Note: Above Lab results reviewed.   Physical Exam  General appearance: Well nourished, well developed, and well hydrated. In no apparent acute distress Mental status: Alert, oriented x 3 (person, place, & time)       Respiratory: No evidence of acute respiratory distress Eyes: PERLA Vitals: BP (!) 128/99   Pulse 72   Temp (!) 97.4 F (36.3 C)   Resp 16   Ht 5\' 5"  (1.651 m)   Wt 167 lb (75.8 kg)   SpO2 100%   BMI 27.79 kg/m  BMI: Estimated body mass index is 27.79 kg/m as calculated from the following:   Height as of this encounter: 5\' 5"  (1.651 m).   Weight as of this encounter: 167 lb (75.8 kg). Ideal: Ideal body weight: 57 kg (125 lb 10.6 oz) Adjusted ideal body weight: 64.5 kg (142 lb 3.2 oz)  Right anterior thigh numbness and pain  Assessment   Diagnosis Status  1. Right thigh pain   2. Meralgia paraesthetica, right   3. Lateral femoral cutaneous entrapment syndrome, right    Having a Flare-up Having a Flare-up Having a Flare-up   Updated Problems: Problem  Lateral Femoral Cutaneous Entrapment Syndrome, Right    Plan of Care   Great response after right L4-L5 ESI and right SI joint and piriformis injection.  Endorses approximately 80 to 85% pain relief.  She states that she was doing very well until she fell.  She is having increased pain overlying her right anterior thigh.  She  states that it is also painful when she is bearing weight or laying on that side.  We discussed a right lateral femoral cutaneous nerve block.   Orders:  Orders Placed This Encounter  Procedures   FEMORAL NERVE BLOCK    Standing Status:   Future    Standing Expiration Date:   08/31/2023    Scheduling Instructions:     Side: RIGHT     Sedation: without     Timeframe: ASAA    Order Specific Question:   Where will this procedure be performed?    Answer:   [facility]   Follow-up plan:   Return in about 16 days (around 06/17/2023) for  Right LFCN [place], in clinic [facility].     Recent Visits Date Type Provider Dept  04/22/23 Procedure visit Edward Jolly, MD [facility]  04/09/23 Office Visit Edward Jolly, MD [facility]  Showing recent visits within past 90 days and meeting all other requirements Today's Visits Date Type Provider Dept  06/01/23 Office Visit Edward Jolly, MD [facility]  Showing today's visits and meeting all other requirements Future Appointments No visits were found meeting these conditions. Showing future appointments within next 90 days and meeting all other requirements  I discussed the assessment and treatment plan with the patient. The patient was provided an opportunity to ask questions and all were answered. The patient agreed with the plan and demonstrated an understanding of the instructions.  Patient advised to call back or seek an in-person evaluation if the symptoms or condition worsens.  Duration of encounter: .  Total time on encounter, as per AMA guidelines included both the face-to-face and non-face-to-face time personally spent by the physician and/or other qualified health care professional(s) on the day of the encounter (includes time in activities that require the physician or other qualified health care professional and does not include time in activities normally performed by clinical staff). Physician's time may include the following activities when performed: Preparing to see the patient (e.g., pre-charting review of records, searching for previously ordered imaging, lab work, and nerve conduction tests) Review of prior analgesic pharmacotherapies. Reviewing PMP Interpreting ordered tests (e.g., lab work, imaging, nerve conduction tests) Performing post-procedure evaluations, including interpretation of diagnostic procedures Obtaining and/or reviewing separately obtained history Performing a medically appropriate examination and/or  evaluation Counseling and educating the patient/family/caregiver Ordering medications, tests, or procedures Referring and communicating with other health care professionals (when not separately reported) Documenting clinical information in the electronic or other health record Independently interpreting results (not separately reported) and communicating results to the patient/ family/caregiver Care coordination (not separately reported)  Note by: Edward Jolly, MD Date: 06/01/2023; Time: 2:34 PM

## 2023-06-02 ENCOUNTER — Telehealth: Payer: Self-pay | Admitting: Gastroenterology

## 2023-06-02 NOTE — Telephone Encounter (Signed)
Okay to direct book for EGD with dilation in the [facility] - first opening. Thanks

## 2023-06-02 NOTE — Telephone Encounter (Signed)
Patient sent MyChart message stating she is having difficulty swallowing again and that it is really hard at night when she is laying down.  Please call patient and advise.  Thank you.

## 2023-06-02 NOTE — Telephone Encounter (Signed)
Called and spoke with patient. Pt has chronic dysphagia but reports that her symptoms have worsened in the last 2-3 months. Last EGD in [place], they did not dilate her esophagus. Pt reports benefiting from esophageal dilation before. Pt is being mindful of diet and chewing breads and meats well. Pt states that her dysphagia is worse when she lies down at night, feels like her saliva is thick and hard to swallow. Pt is still taking Omeprazole 40 mg daily before breakfast and she also takes Famotidine 20 mg at bedtime. Please advise if patient can be scheduled for direct EGD with dilation or if she needs an OV for evaluation. Thanks

## 2023-06-04 ENCOUNTER — Telehealth: Payer: Self-pay

## 2023-06-04 NOTE — Telephone Encounter (Signed)
Called back but unable to reach patient.  Left message to call schedulers and reschedule Pre visit by 5 pm today or EGD would be cancelled.

## 2023-06-04 NOTE — Telephone Encounter (Signed)
Unable to reach patient.  Left message stating I would try back in 5 min

## 2023-06-09 ENCOUNTER — Encounter: Payer: Self-pay | Admitting: Nurse Practitioner

## 2023-06-09 ENCOUNTER — Ambulatory Visit (INDEPENDENT_AMBULATORY_CARE_PROVIDER_SITE_OTHER): Payer: Medicare Other | Admitting: Nurse Practitioner

## 2023-06-09 ENCOUNTER — Other Ambulatory Visit (HOSPITAL_COMMUNITY)
Admission: RE | Admit: 2023-06-09 | Discharge: 2023-06-09 | Disposition: A | Discharge status: Home / Self Care | Payer: Medicare Other | Source: Ambulatory Visit | Attending: Nurse Practitioner | Admitting: Nurse Practitioner

## 2023-06-09 VITALS — BP 121/88 | HR 62 | Temp 98.2°F | Ht 65.0 in | Wt 170.0 lb

## 2023-06-09 DIAGNOSIS — F339 Major depressive disorder, recurrent, unspecified: Secondary | ICD-10-CM | POA: Diagnosis not present

## 2023-06-09 DIAGNOSIS — Z1151 Encounter for screening for human papillomavirus (HPV): Secondary | ICD-10-CM | POA: Diagnosis not present

## 2023-06-09 DIAGNOSIS — Z0000 Encounter for general adult medical examination without abnormal findings: Secondary | ICD-10-CM

## 2023-06-09 DIAGNOSIS — Z01419 Encounter for gynecological examination (general) (routine) without abnormal findings: Secondary | ICD-10-CM | POA: Insufficient documentation

## 2023-06-09 DIAGNOSIS — M810 Age-related osteoporosis without current pathological fracture: Secondary | ICD-10-CM

## 2023-06-09 DIAGNOSIS — I10 Essential (primary) hypertension: Secondary | ICD-10-CM | POA: Diagnosis not present

## 2023-06-09 DIAGNOSIS — E039 Hypothyroidism, unspecified: Secondary | ICD-10-CM

## 2023-06-09 DIAGNOSIS — I6381 Other cerebral infarction due to occlusion or stenosis of small artery: Secondary | ICD-10-CM

## 2023-06-09 DIAGNOSIS — Z131 Encounter for screening for diabetes mellitus: Secondary | ICD-10-CM

## 2023-06-09 DIAGNOSIS — Z124 Encounter for screening for malignant neoplasm of cervix: Secondary | ICD-10-CM

## 2023-06-09 MED ORDER — ATORVASTATIN CALCIUM 20 MG PO TABS
20.0000 mg | ORAL_TABLET | Freq: Every day | ORAL | 3 refills | Status: DC
Start: 2023-06-09 — End: 2024-07-05

## 2023-06-09 MED ORDER — AMLODIPINE BESYLATE 5 MG PO TABS
5.0000 mg | ORAL_TABLET | Freq: Every day | ORAL | 3 refills | Status: DC
Start: 2023-06-09 — End: 2024-06-27

## 2023-06-09 MED ORDER — LEVOTHYROXINE SODIUM 175 MCG PO TABS: 175.0000 ug | ORAL_TABLET | Freq: Every day | ORAL | 3 refills | Status: DC

## 2023-06-09 NOTE — Assessment & Plan Note (Signed)
Physical exam complete. Lab work as outlined. Will contact patient with results. Pap- today! Colonoscopy- UTD. Mammogram- UTD. Flu vaccine not due. Tetanus vaccine- UTD. Shingles series completed. Declined additional COVID vaccines. Due for PCV-20, will plan to give at next appointment, deferred today. HIV and Hep C screenings- negative. Recommended follow ups with Dentist and Ophthalmology for annual exams. Encouraged to continue healthy diet and exercise. Return to care in 6 months, sooner PRN.

## 2023-06-09 NOTE — Assessment & Plan Note (Signed)
Continue daily OTC vitamin D and calcium. Advised daily weightbearing exercises. Follow up with Endo as scheduled.

## 2023-06-09 NOTE — Assessment & Plan Note (Signed)
Chronic. Stable on Levothyroxine 175 mcg daily. Continue. Refills sent. Will check TSH today.

## 2023-06-09 NOTE — Assessment & Plan Note (Signed)
Chronic. Stable on Norvasc 5 mg daily. Continue. Refills sent. Lab work as outlined.

## 2023-06-09 NOTE — Assessment & Plan Note (Signed)
Continue ASA and Lipitor. Refills sent. Will check lipid panel today. No longer followed by Neurology. Follow up with Cardiology as scheduled.

## 2023-06-09 NOTE — Progress Notes (Signed)
Bethanie Dicker, NP-C Phone: [phone]  [name] is a 67 y.o. female who presents today for annual exam. She has no complaints or new concerns today. She is doing well on all of her medications.   Diet: Trying to improve- fluctuates, cooks mostly at home, overeats when stressed Exercise: Gym 4-5 days per week, walking several miles 3 days per week.  Pap smear: 12/29/2019- Due! Colonoscopy: 04/13/2020 - 10 year recall Mammogram: 11/27/2022 Family history-  Colon cancer: Yes, sister  Breast cancer: No  Ovarian cancer: Yes, sister Vaccines-   Flu: Not due  Tetanus: 11/26/2022  Shingles: Completed  COVID19: x 3  Pneumonia: x 1 HIV screening: Negative Hep C Screening: Negative Tobacco use: No Alcohol use: Yes, 2 days per week Illicit Drug use: No Dentist: Yes Ophthalmology: Yes  [id number] Wt Readings from Last 3 Encounters:  06/09/23 170 lb (77.1 kg)  06/01/23 167 lb (75.8 kg)  04/22/23 166 lb (75.3 kg)   Last period:  Postmenopausal Regular periods: No Heavy bleeding: N/A  Sexually active- No Birth control or hormonal therapy: No Hx of STD: Patient desires STD screening Dyspareunia: No Hot flashes: No Vaginal discharge: No Dysuria: No   Breast mass or concerns: No History of abnormal pap: No    Social History   Tobacco Use  Smoking Status Never  Smokeless Tobacco Never    Current Outpatient Medications on File Prior to Visit  Medication Sig Dispense Refill   Aspirin 81 MG CAPS Take by mouth daily at 8 pm.     famotidine (PEPCID) 20 MG tablet Take 20 mg by mouth at bedtime.     omeprazole (PRILOSEC) 40 MG capsule Take 1 capsule (40 mg total) by mouth daily before breakfast. 90 capsule 3   [DISCONTINUED] traZODone (DESYREL) 50 MG tablet Take 0.5-1 tablets (25-50 mg total) by mouth at bedtime as needed for sleep. 90 tablet 3   Current Facility-Administered Medications on File Prior to Visit  Medication Dose Route Frequency Provider Last Rate Last Admin    aspirin tablet 325 mg  325 mg Oral Daily End, Christopher, MD   325 mg at 11/14/22 1340    ROS see history of present illness  Objective  Physical Exam Vitals:   06/09/23 1350  BP: 121/88  Pulse: 62  Temp: 98.2 F (36.8 C)  SpO2: 100%    BP Readings from Last 3 Encounters:  06/09/23 121/88  06/01/23 (!) 128/99  04/22/23 (!) 138/102   Wt Readings from Last 3 Encounters:  06/09/23 170 lb (77.1 kg)  06/01/23 167 lb (75.8 kg)  04/22/23 166 lb (75.3 kg)    Physical Exam Exam conducted with a chaperone present Donavan Foil, CMA).  Constitutional:      General: She is not in acute distress.    Appearance: Normal appearance.  HENT:     Head: Normocephalic.     Right Ear: Tympanic membrane normal.     Left Ear: Tympanic membrane normal.     Nose: Nose normal.     Mouth/Throat:     Mouth: Mucous membranes are moist.     Pharynx: Oropharynx is clear.  Eyes:     Conjunctiva/sclera: Conjunctivae normal.     Pupils: Pupils are equal, round, and reactive to light.  Neck:     Thyroid: No thyromegaly.  Cardiovascular:     Rate and Rhythm: Normal rate and regular rhythm.     Heart sounds: Normal heart sounds.  Pulmonary:     Effort: Pulmonary effort is  normal.     Breath sounds: Normal breath sounds.  Abdominal:     General: Abdomen is flat. Bowel sounds are normal.     Palpations: Abdomen is soft. There is no mass.     Tenderness: There is no abdominal tenderness.  Genitourinary:    General: Normal vulva.     Pubic Area: No rash.      Labia:        Right: No rash or lesion.        Left: No rash or lesion.      Vagina: Normal. No vaginal discharge or bleeding.     Cervix: Normal. No discharge, lesion or cervical bleeding.     Uterus: Normal.   Musculoskeletal:        General: Normal range of motion.  Lymphadenopathy:     Cervical: No cervical adenopathy.  Skin:    General: Skin is warm and dry.     Findings: No rash.  Neurological:     General: No focal  deficit present.     Mental Status: She is alert.  Psychiatric:        Mood and Affect: Mood normal.        Behavior: Behavior normal.    Assessment/Plan: Please see individual problem list.  Well woman exam with routine gynecological exam Assessment & Plan: Physical exam complete. Lab work as outlined. Will contact patient with results. Pap- today! Colonoscopy- UTD. Mammogram- UTD. Flu vaccine not due. Tetanus vaccine- UTD. Shingles series completed. Declined additional COVID vaccines. Due for PCV-20, will plan to give at next appointment, deferred today. HIV and Hep C screenings- negative. Recommended follow ups with Dentist and Ophthalmology for annual exams. Encouraged to continue healthy diet and exercise. Return to care in 6 months, sooner PRN.    Hypothyroidism, unspecified type Assessment & Plan: Chronic. Stable on Levothyroxine 175 mcg daily. Continue. Refills sent. Will check TSH today.   Orders: -     TSH -     Levothyroxine Sodium; Take 1 tablet (175 mcg total) by mouth daily before breakfast.  Dispense: 90 tablet; Refill: 3  [place] stroke [facility]) Assessment & Plan: Continue ASA and Lipitor. Refills sent. Will check lipid panel today. No longer followed by Neurology. Follow up with Cardiology as scheduled.   Orders: -     Lipid panel -     Atorvastatin Calcium; Take 1 tablet (20 mg total) by mouth daily.  Dispense: 90 tablet; Refill: 3  Episode of recurrent major depressive disorder, unspecified depression episode severity (HCC) Assessment & Plan: Chronic. Patient trying to establish with counselor/therapist. She is not interested in medications. Denies SI/HI. Will refer to [facility]. Encouraged to contact if worsening symptoms, unusual behavior changes or suicidal thoughts occur. Will continue to monitor.   Orders: -     Ambulatory referral to Psychology  Osteoporosis, unspecified osteoporosis type, unspecified pathological fracture presence Assessment &  Plan: Continue daily OTC vitamin D and calcium. Advised daily weightbearing exercises. Follow up with Endo as scheduled.   Orders: -     VITAMIN D 25 Hydroxy (Vit-D Deficiency, Fractures)  Essential hypertension Assessment & Plan: Chronic. Stable on Norvasc 5 mg daily. Continue. Refills sent. Lab work as outlined.   Orders: -     CBC with Differential/Platelet -     Comprehensive metabolic panel -     amLODIPine Besylate; Take 1 tablet (5 mg total) by mouth daily.  Dispense: 90 tablet; Refill: 3  Screening for cervical cancer -  Cytology - PAP  Diabetes mellitus screening -     Hemoglobin A1c    Return in about 6 months (around 12/07/2023) for Follow up.   Bethanie Dicker, NP-C [facility] [employer]

## 2023-06-09 NOTE — Assessment & Plan Note (Addendum)
Chronic. Patient trying to establish with counselor/therapist. She is not interested in medications. Denies SI/HI. Will refer to [facility]. Encouraged to contact if worsening symptoms, unusual behavior changes or suicidal thoughts occur. Will continue to monitor.

## 2023-06-11 ENCOUNTER — Other Ambulatory Visit: Payer: Self-pay | Admitting: Nurse Practitioner

## 2023-06-11 DIAGNOSIS — R1310 Dysphagia, unspecified: Secondary | ICD-10-CM

## 2023-06-11 DIAGNOSIS — E039 Hypothyroidism, unspecified: Secondary | ICD-10-CM

## 2023-06-11 MED ORDER — LEVOTHYROXINE SODIUM 200 MCG PO TABS
200.0000 ug | ORAL_TABLET | Freq: Every day | ORAL | 0 refills | Status: DC
Start: 2023-06-11 — End: 2023-09-03

## 2023-06-12 ENCOUNTER — Encounter: Payer: Self-pay | Admitting: Cardiology

## 2023-06-12 ENCOUNTER — Ambulatory Visit: Payer: Medicare Other | Attending: Internal Medicine | Admitting: Cardiology

## 2023-06-12 VITALS — BP 122/84 | HR 66 | Ht 65.0 in | Wt 168.8 lb

## 2023-06-12 DIAGNOSIS — E039 Hypothyroidism, unspecified: Secondary | ICD-10-CM

## 2023-06-12 DIAGNOSIS — I10 Essential (primary) hypertension: Secondary | ICD-10-CM

## 2023-06-12 DIAGNOSIS — Z8673 Personal history of transient ischemic attack (TIA), and cerebral infarction without residual deficits: Secondary | ICD-10-CM | POA: Diagnosis not present

## 2023-06-12 DIAGNOSIS — R0789 Other chest pain: Secondary | ICD-10-CM

## 2023-06-12 LAB — CYTOLOGY - PAP
Chlamydia: NEGATIVE
Comment: NEGATIVE
Comment: NEGATIVE
Comment: NEGATIVE
Comment: NORMAL
Diagnosis: NEGATIVE
High risk HPV: NEGATIVE
Neisseria Gonorrhea: NEGATIVE
Trichomonas: NEGATIVE

## 2023-06-12 MED ORDER — TRIAMTERENE-HCTZ 37.5-25 MG PO TABS: 0.5000 | ORAL_TABLET | Freq: Every day | ORAL | 3 refills | Status: AC

## 2023-06-12 NOTE — Progress Notes (Signed)
Cardiology Office Note:  .   Date:  06/12/2023  ID:  ANGELA JOHANSON, DOB 1955/11/10, MRN [id number] PCP: Bethanie Dicker, NP  [facility] Providers Cardiologist:  None    History of Present Illness: .   SUNI AMEND is a 67 y.o. female with past medical history of hypertension, hypothyroidism, arthritis, depression/anxiety, palpitations, Lacunar stroke, who is here today for follow-up.  September 2023 after MRI of the brain showed remote clinic or infarct with minimal chronic microvascular changes.  She was asymptomatic at that time but continued to have weakness and decreased range of motion following an ACDF.  Prior echocardiogram completed 07/17/2022 revealed an EF of 60 to 65%, no RWMA, G1 DD, left atrial size with mildly dilated, and mild mitral valve regurgitation with negative bubble study.  Event monitor revealed rare PACs and PVCs and few episodes of brief PSVT.  There was no atrial fibrillation/flutter was observed.  She was continued on her current medication regimen.  She was last seen in clinic 11/14/2022 by Dr. Okey Dupre.  She had been experiencing chest discomfort that began 5 days prior after eating chicken wings.  The pain persisted for several hours despite Tums and aspirin prompting her to summon EMS.  EMS evaluation was unremarkable and she declined transport to the emergency department.  She underwent upper endoscopy 2 days later which was unremarkable.  She rates her current pain 7 out of 10 in intensity the pain over the weekend was even worse if again and severe up to 9/10.  She was administered aspirin 325 mg in the office and transported to the emergency department for further evaluation.  If her ED evaluation was unrevealing then she can be discharged home she was going to be arranged for coronary CTA.  She presented to [facility] emergency department from the office with stable vital signs.  Imaging was found to be reassuring.  Troponins were normal.  She denied any pain or  discomfort.  She was able to be discharged home and was advised to return to the ER for recurrent symptoms.  She presents to clinic today stating from the cardiac perspective she has been doing fairly well.  She denies any recurrent chest pain or shortness of breath.  She continues to have dysphagia and has had esophageal dilatation scheduled in the upcoming weeks.  She is also noted some numbness and tingling in her right arm that she quantifies it to being as she sleeps.  She states that her thyroid medication dose was also increased due to an elevated TSH and she has a thyroid ultrasound that is upcoming.  She denies any recent hospitalizations or visits to the emergency department stating that she has been compliant with her current medication regimen.  ROS: 10 point review of systems has been reviewed and considered negative with exception of what is been listed in the HPI  Studies Reviewed: Marland Kitchen   EKG Interpretation Date/Time:  Friday June 12 2023 10:09:29 EDT Ventricular Rate:  66 PR Interval:  162 QRS Duration:  80 QT Interval:  394 QTC Calculation: 413 R Axis:   17  Text Interpretation: Normal sinus rhythm Normal ECG When compared with ECG of 14-Nov-2022 13:46, No significant change was found Confirmed by Charlsie Quest ([id number]) on 06/12/2023 10:13:32 AM   Coronary CTA 12/04/22 IMPRESSION: 1. Normal coronary calcium score of 0. Patient is low risk.   2. Normal coronary origin with right dominance.   3. No evidence of CAD.   4. CAD-RADS 0. Consider  non-atherosclerotic causes of chest pain.  EP Procedures and Devices: Event monitor (06/13/2022): Patient was monitored for 11 days, 23 hours.  Predominantly sinus rhythm with rare PACs and PVCs as well as a few brief episodes of PSVT lasting up to 8 beats.  No evidence of atrial fibrillation/flutter.   Non-Invasive Evaluation(s): TTE with bubble study (07/17/2022): Normal LV size with borderline LVH.  LVEF 60-65% with normal wall  motion.  Grade 1 diastolic dysfunction.  Normal RV size and function.  Mild left atrial enlargement.  Mild mitral regurgitation.  Otherwise, no significant valvular abnormality.  CVP 3.  Bubble study negative for interatrial shunt. Risk Assessment/Calculations:             Physical Exam:   VS:  BP 122/84 (BP Location: Left Arm, Patient Position: Sitting, Cuff Size: Normal)   Pulse 66   Ht 5\' 5"  (1.651 m)   Wt 168 lb 12.8 oz (76.6 kg)   SpO2 98%   BMI 28.09 kg/m    Wt Readings from Last 3 Encounters:  06/12/23 168 lb 12.8 oz (76.6 kg)  06/09/23 170 lb (77.1 kg)  06/01/23 167 lb (75.8 kg)    GEN: Well nourished, well developed in no acute distress NECK: No JVD; No carotid bruits CARDIAC: RRR, II/VI systolic murmur without rubs or gallops RESPIRATORY:  Clear to auscultation without rales, wheezing or rhonchi  ABDOMEN: Soft, non-tender, non-distended EXTREMITIES:  No edema; No deformity   ASSESSMENT AND PLAN: .   Primary hypertension with blood pressure today 122/84.  Blood pressures remain stable.  Unfortunately upon further discussion with patient's medication she has been taking her triamterene HCTZ 37.5/25 mg half a pill daily on a as needed basis.  She states that she has had several visits where blood pressures noted to be elevated.  We discussed that she needs to take her medication as prescribed to continue with her Norvasc 5 mg.  She will restart her medication and continue to monitor blood pressures 1 to 2 hours postmedication administration.  She states he has upcoming procedures and will have blood work scheduled.  Atypical chest pain that has resolved.  She recently evaluated in the emergency department in February with no revealing workup.  Coronary CTA was completed 12/04/2022 which revealed a calcium score of 0.  EKG today shows sinus rhythm with a rate of 66 with no acute change or concern for ischemic changes. She has continued to work on primary prevention.  Lacunar stroke  was an incidental finding on brain MRI in the setting of dizziness.  CT of the head and neck did not show significant cervical intracranial stenosis.  Study was negative as well.  She is continued on aspirin 81 mg daily continue atorvastatin 20 mg daily.  Discussed increasing atorvastatin to 40 mg and but she is apprehensive after extensive conversation with friends and family of side effects of statin medications.  Hypothyroidism with a TSH of 22.  She is continued on levothyroxine 200 mcg daily.  She states that she has an upcoming thyroid ultrasound.  This continues to be followed by her PCP.       Dispo: Patient to return to clinic to see MD/APP in 3-4 months or sooner if needed  Signed, Cherina Dhillon, NP

## 2023-06-12 NOTE — Patient Instructions (Signed)
Medication Instructions:  Your physician has recommended you make the following change in your medication:   START - triamterene-hydrochlorothiazide (MAXZIDE-25) 37.5-25 MG tablet - Take 0.5 tablets by mouth daily  *If you need a refill on your cardiac medications before your next appointment, please call your pharmacy*  Lab Work: -None ordered  Testing/Procedures: -None ordered  Follow-Up: At [facility], you and your health needs are our priority.  As part of our continuing mission to provide you with exceptional heart care, we have created designated Provider Care Teams.  These Care Teams include your primary Cardiologist (physician) and Advanced Practice Providers (APPs -  Physician Assistants and Nurse Practitioners) who all work together to provide you with the care you need, when you need it.  Your next appointment:   4 month(s)  Provider:   Yvonne Kendall, MD or Charlsie Quest, NP    Other Instructions -None

## 2023-06-16 ENCOUNTER — Encounter: Payer: Medicare Other | Admitting: *Deleted

## 2023-06-16 NOTE — Progress Notes (Signed)
This encounter was created in error - please disregard. Patient needs to reschedule because she was away from home and busy at this time.Patient declined to reschedule at this time.

## 2023-06-17 ENCOUNTER — Ambulatory Visit
Payer: Medicare Other | Attending: Student in an Organized Health Care Education/Training Program | Admitting: Student in an Organized Health Care Education/Training Program

## 2023-06-17 ENCOUNTER — Encounter: Payer: Self-pay | Admitting: Student in an Organized Health Care Education/Training Program

## 2023-06-17 VITALS — BP 95/67 | HR 66 | Temp 97.2°F | Resp 16 | Ht 64.0 in | Wt 166.0 lb

## 2023-06-17 DIAGNOSIS — M79651 Pain in right thigh: Secondary | ICD-10-CM

## 2023-06-17 DIAGNOSIS — G5711 Meralgia paresthetica, right lower limb: Secondary | ICD-10-CM | POA: Insufficient documentation

## 2023-06-17 DIAGNOSIS — M25551 Pain in right hip: Secondary | ICD-10-CM | POA: Diagnosis not present

## 2023-06-17 MED ORDER — DEXAMETHASONE SODIUM PHOSPHATE 10 MG/ML IJ SOLN
10.0000 mg | Freq: Once | INTRAMUSCULAR | Status: AC
  Administered 2023-06-17: 10 mg

## 2023-06-17 MED ORDER — LIDOCAINE HCL 2 % IJ SOLN
20.0000 mL | Freq: Once | INTRAMUSCULAR | Status: AC
  Administered 2023-06-17: 200 mg

## 2023-06-17 MED ORDER — ROPIVACAINE HCL 2 MG/ML IJ SOLN
9.0000 mL | Freq: Once | INTRAMUSCULAR | Status: AC
  Administered 2023-06-17: 9 mL via INTRA_ARTICULAR

## 2023-06-17 MED ORDER — LIDOCAINE HCL (PF) 2 % IJ SOLN
INTRAMUSCULAR | Status: AC
  Filled 2023-06-17: qty 10

## 2023-06-17 MED ORDER — ROPIVACAINE HCL 2 MG/ML IJ SOLN
INTRAMUSCULAR | Status: AC
  Filled 2023-06-17: qty 20

## 2023-06-17 MED ORDER — DEXAMETHASONE SODIUM PHOSPHATE 10 MG/ML IJ SOLN
INTRAMUSCULAR | Status: AC
  Filled 2023-06-17: qty 1

## 2023-06-17 NOTE — Progress Notes (Signed)
Safety precautions to be maintained throughout the outpatient stay will include: orient to surroundings, keep bed in low position, maintain call bell within reach at all times, provide assistance with transfer out of bed and ambulation.  

## 2023-06-17 NOTE — Progress Notes (Signed)
PROVIDER NOTE: Interpretation of information contained herein should be left to medically-trained personnel. Specific patient instructions are provided elsewhere under "Patient Instructions" section of medical record. This document was created in part using STT-dictation technology, any transcriptional errors that may result from this process are unintentional.  Patient: [name] Type: Established DOB: 04/09/1956 MRN: [id number] PCP: Bethanie Dicker, NP  Service: Procedure DOS: 06/17/2023 Setting: Ambulatory Location: Ambulatory outpatient facility Delivery: Face-to-face Provider: Edward Jolly, MD Specialty: Interventional Pain Management Specialty designation: 09 Location: Outpatient facility Ref. Prov.: Bethanie Dicker, NP       Interventional Therapy   Primary Reason for Visit: Interventional Pain Management Treatment. CC: Hip Pain (right)    Procedure:          Anesthesia, Analgesia, Anxiolysis:  Right lateral femoral cutaneous nerve block.  Anesthesia: Local (1-2% Lidocaine)  Anxiolysis: None  Sedation: None  Guidance: Ultrasound           Position: Supine   1. Right thigh pain   2. Meralgia paraesthetica, right   3. Lateral femoral cutaneous entrapment syndrome, right    NAS-11 Pain score:   Pre-procedure: 8 /10   Post-procedure: 0-No pain/10     H&P (Pre-op Assessment):  [name] is a 67 y.o. (year old), female patient, seen today for interventional treatment. She  has a past surgical history that includes Back surgery; Breast surgery; Augmentation mammaplasty (Bilateral, 2006); Esophagogastroduodenoscopy (egd) with propofol (N/A, 10/24/2020); Esophagogastroduodenoscopy (egd) with propofol (N/A, 12/20/2020); Esophageal manometry (N/A, 03/04/2021); 24 hour ph study (N/A, 03/04/2021); PH impedance study (N/A, 03/04/2021); Vein Surgery (1991); Upper gastrointestinal endoscopy; Colonoscopy; Anterior cervical decomp/discectomy fusion (N/A, 05/12/2022); and  Esophagogastroduodenoscopy (egd) with propofol (N/A, 11/11/2022). [name] has a current medication list which includes the following prescription(s): amlodipine, aspirin, atorvastatin, famotidine, levothyroxine, omeprazole, triamterene-hydrochlorothiazide, and [DISCONTINUED] trazodone, and the following Facility-Administered Medications: aspirin. Her primarily concern today is the Hip Pain (right)  Initial Vital Signs:  Pulse/HCG Rate: 66  Temp: (!) 97.2 F (36.2 C) Resp: 16 BP: 114/82 SpO2: 99 %  BMI: Estimated body mass index is 28.49 kg/m as calculated from the following:   Height as of this encounter: 5\' 4"  (1.626 m).   Weight as of this encounter: 166 lb (75.3 kg).  Risk Assessment: Allergies: Reviewed. She has No Known Allergies.  Allergy Precautions: None required Coagulopathies: Reviewed. None identified.  Blood-thinner therapy: None at this time Active Infection(s): Reviewed. None identified. [name] is afebrile  Site Confirmation: [name] was asked to confirm the procedure and laterality before marking the site Procedure checklist: Completed Consent: Before the procedure and under the influence of no sedative(s), amnesic(s), or anxiolytics, the patient was informed of the treatment options, risks and possible complications. To fulfill our ethical and legal obligations, as recommended by the American Medical Association's Code of Ethics, I have informed the patient of my clinical impression; the nature and purpose of the treatment or procedure; the risks, benefits, and possible complications of the intervention; the alternatives, including doing nothing; the risk(s) and benefit(s) of the alternative treatment(s) or procedure(s); and the risk(s) and benefit(s) of doing nothing. The patient was provided information about the general risks and possible complications associated with the procedure. These may include, but are not limited to: failure to achieve desired goals,  infection, bleeding, organ or nerve damage, allergic reactions, paralysis, and death. In addition, the patient was informed of those risks and complications associated to the procedure, such as failure to decrease pain; infection; bleeding; organ or nerve damage  with subsequent damage to sensory, motor, and/or autonomic systems, resulting in permanent pain, numbness, and/or weakness of one or several areas of the body; allergic reactions; (i.e.: anaphylactic reaction); and/or death. Furthermore, the patient was informed of those risks and complications associated with the medications. These include, but are not limited to: allergic reactions (i.e.: anaphylactic or anaphylactoid reaction(s)); adrenal axis suppression; blood sugar elevation that in diabetics may result in ketoacidosis or comma; water retention that in patients with history of congestive heart failure may result in shortness of breath, pulmonary edema, and decompensation with resultant heart failure; weight gain; swelling or edema; medication-induced neural toxicity; particulate matter embolism and blood vessel occlusion with resultant organ, and/or nervous system infarction; and/or aseptic necrosis of one or more joints. Finally, the patient was informed that Medicine is not an exact science; therefore, there is also the possibility of unforeseen or unpredictable risks and/or possible complications that may result in a catastrophic outcome. The patient indicated having understood very clearly. We have given the patient no guarantees and we have made no promises. Enough time was given to the patient to ask questions, all of which were answered to the patient's satisfaction. [name] has indicated that she wanted to continue with the procedure. Attestation: I, the ordering provider, attest that I have discussed with the patient the benefits, risks, side-effects, alternatives, likelihood of achieving goals, and potential problems during recovery  for the procedure that I have provided informed consent. Date  Time: 06/17/2023 10:24 AM  Pre-Procedure Preparation:  Monitoring: As per clinic protocol. Respiration, ETCO2, SpO2, BP, heart rate and rhythm monitor placed and checked for adequate function Safety Precautions: Patient was assessed for positional comfort and pressure points before starting the procedure. Time-out: I initiated and conducted the "Time-out" before starting the procedure, as per protocol. The patient was asked to participate by confirming the accuracy of the "Time Out" information. Verification of the correct person, site, and procedure were performed and confirmed by me, the nursing staff, and the patient. "Time-out" conducted as per Joint Commission's Universal Protocol (UP.01.01.01). Time: 1113 Start Time: 1113 hrs.  Description of Procedure:          Area Prepped: Entire Posterolateral hip area. DuraPrep (Iodine Povacrylex [0.7% available iodine] and Isopropyl Alcohol, 74% w/w) Safety Precautions: Aspiration looking for blood return was conducted prior to all injections. At no point did we inject any substances, as a needle was being advanced. No attempts were made at seeking any paresthesias. Safe injection practices and needle disposal techniques used. Medications properly checked for expiration dates. SDV (single dose vial) medications used.  Description of the Procedure (block of the [facility]) : Protocol guidelines were followed. The patient was placed in supine position over the procedure table. The entire groin area and upper thigh was prepped and draped in the usual manner.  The lateral femoral cutaneous nerve was identified and between the tensor fascia lata and the sartorius and was located 1.5 cm medial and inferior to the anterior superior iliac spine parallel to the ileal inguinal ligament.  Under ultrasound guidance using in-plane technique, 5 cc solution made of 4 cc of 0.2% lidocaine, 1 cc of Decadron 10 mg/cc  was injected for the right lateral femoral cutaneous nerve.   Vitals:   06/17/23 1031 06/17/23 1100  BP: 114/82 95/67  Pulse: 66 66  Resp:  16  Temp: (!) 97.2 F (36.2 C)   SpO2: 99% 100%  Weight: 166 lb (75.3 kg)   Height: 5\' 4"  (1.626 m)  Start Time: 1113 hrs. End Time: 1125 hrs.    Materials:  Needle(s) Type: Spinal Needle Gauge: 22G Length: 3.5-in Medication(s): Please see orders for medications and dosing details.  Imaging Guidance (Non-Spinal):          Ultrasound guidance  Antibiotic Prophylaxis:   Anti-infectives (From admission, onward)    None      Indication(s): None identified  Post-operative Assessment:  Post-procedure Vital Signs:  Pulse/HCG Rate: 66  Temp: (!) 97.2 F (36.2 C) Resp: 16 BP: 95/67 SpO2: 100 %  EBL: None  Complications: No immediate post-treatment complications observed by team, or reported by patient.  Note: The patient tolerated the entire procedure well. A repeat set of vitals were taken after the procedure and the patient was kept under observation following institutional policy, for this type of procedure. Post-procedural neurological assessment was performed, showing return to baseline, prior to discharge. The patient was provided with post-procedure discharge instructions, including a section on how to identify potential problems. Should any problems arise concerning this procedure, the patient was given instructions to immediately contact us, at any time, without hesitation. In any case, we plan to contact the patient by telephone for a follow-up status report regarding this interventional procedure.  Comments:  No additional relevant information.  Plan of Care (POC)  Orders:  No orders of the defined types were placed in this encounter.    Medications ordered for procedure: Meds ordered this encounter  Medications   dexamethasone (DECADRON) injection 10 mg   ropivacaine (PF) 2 mg/mL (0.2%) (NAROPIN) injection 9  mL   lidocaine (XYLOCAINE) 2 % (with pres) injection 400 mg   Medications administered: We administered dexamethasone, ropivacaine (PF) 2 mg/mL (0.2%), and lidocaine.  See the medical record for exact dosing, route, and time of administration.  Follow-up plan:   Return in about 4 weeks (around 07/15/2023), or F2F PPE.       Right SI-J and Right Piriformis 04/17/21, 09/02/21. 11/12/22, RIGHT LFCN 06/17/23     Recent Visits Date Type Provider Dept  06/01/23 Office Visit Edward Jolly, MD [facility]  04/22/23 Procedure visit Edward Jolly, MD [facility]  04/09/23 Office Visit Edward Jolly, MD [facility]  Showing recent visits within past 90 days and meeting all other requirements Today's Visits Date Type Provider Dept  06/17/23 Procedure visit Edward Jolly, MD [facility]  Showing today's visits and meeting all other requirements Future Appointments Date Type Provider Dept  07/15/23 Appointment Edward Jolly, MD [facility]  Showing future appointments within next 90 days and meeting all other requirements  Disposition: Discharge home  Discharge (Date  Time): 06/17/2023; 1130 hrs.   Primary Care Physician: Bethanie Dicker, NP Location: [facility] Note by: Edward Jolly, MD (TTS technology used. I apologize for any typographical errors that were not detected and corrected.) Date: 06/17/2023; Time: 1:49 PM  Disclaimer:  Medicine is not an Visual merchandiser. The only guarantee in medicine is that nothing is guaranteed. It is important to note that the decision to proceed with this intervention was based on the information collected from the patient. The Data and conclusions were drawn from the patient's questionnaire, the interview, and the physical examination. Because the information was provided in large part by the patient, it cannot be guaranteed that it has not been purposely or unconsciously manipulated.  Every effort has been made to obtain as much relevant data as possible for this evaluation. It is important to note that  the conclusions that lead to this procedure are derived in large part from the available data. Always take into account that the treatment will also be dependent on availability of resources and existing treatment guidelines, considered by other Pain Management Practitioners as being common knowledge and practice, at the time of the intervention. For Medico-Legal purposes, it is also important to point out that variation in procedural techniques and pharmacological choices are the acceptable norm. The indications, contraindications, technique, and results of the above procedure should only be interpreted and judged by a Board-Certified Interventional Pain Specialist with extensive familiarity and expertise in the same exact procedure and technique.

## 2023-06-17 NOTE — Patient Instructions (Signed)

## 2023-06-18 ENCOUNTER — Telehealth: Payer: Self-pay | Admitting: *Deleted

## 2023-06-18 ENCOUNTER — Encounter: Payer: Medicare Other | Admitting: Gastroenterology

## 2023-06-18 NOTE — Telephone Encounter (Signed)
No problems post procedure. 

## 2023-06-19 ENCOUNTER — Ambulatory Visit (INDEPENDENT_AMBULATORY_CARE_PROVIDER_SITE_OTHER): Payer: Medicare Other | Admitting: Nurse Practitioner

## 2023-06-19 ENCOUNTER — Ambulatory Visit: Payer: Medicare Other | Admitting: Nurse Practitioner

## 2023-06-19 ENCOUNTER — Encounter: Payer: Self-pay | Admitting: Nurse Practitioner

## 2023-06-19 VITALS — BP 126/74 | HR 86 | Temp 97.9°F | Ht 64.0 in | Wt 165.2 lb

## 2023-06-19 DIAGNOSIS — J029 Acute pharyngitis, unspecified: Secondary | ICD-10-CM | POA: Diagnosis not present

## 2023-06-19 LAB — POCT RAPID STREP A (OFFICE): Rapid Strep A Screen: NEGATIVE

## 2023-06-19 LAB — POC COVID19 BINAXNOW: SARS Coronavirus 2 Ag: NEGATIVE
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
## 2023-06-22 ENCOUNTER — Ambulatory Visit
Admission: RE | Admit: 2023-06-22 | Discharge: 2023-06-22 | Disposition: A | Discharge status: Home / Self Care | Payer: Medicare Other | Source: Ambulatory Visit | Attending: Nurse Practitioner | Admitting: Nurse Practitioner

## 2023-06-22 DIAGNOSIS — R1310 Dysphagia, unspecified: Secondary | ICD-10-CM | POA: Diagnosis not present

## 2023-06-22 DIAGNOSIS — E039 Hypothyroidism, unspecified: Secondary | ICD-10-CM | POA: Insufficient documentation

## 2023-06-22 NOTE — Assessment & Plan Note (Addendum)
POCT strep and COVID-negative. Advised patient to perform symptomatic treatment. Advised to use Chloraseptic spray for numbing the throat. Close follow-up. Advised patient to get immediate help if have trouble swallowing and difficulty breathing.

## 2023-06-29 ENCOUNTER — Encounter: Payer: Self-pay | Admitting: Gastroenterology

## 2023-06-29 ENCOUNTER — Ambulatory Visit (AMBULATORY_SURGERY_CENTER): Payer: Medicare Other

## 2023-06-29 VITALS — Ht 65.0 in | Wt 166.0 lb

## 2023-06-29 DIAGNOSIS — R1310 Dysphagia, unspecified: Secondary | ICD-10-CM

## 2023-06-29 DIAGNOSIS — R1314 Dysphagia, pharyngoesophageal phase: Secondary | ICD-10-CM

## 2023-06-29 NOTE — Progress Notes (Signed)
Pre visit completed via phone call; Patient verified name, DOB, and address; No egg or soy allergy known to patient;  No issues known to pt with past sedation with any surgeries or procedures; Patient denies ever being told they had issues or difficulty with intubation;  No FH of Malignant Hyperthermia; Pt is not on diet pills; Pt is not on home 02;  Pt is not on blood thinners;  Pt reports issues with constipation in the past-uses Miralax for relief; having an EGD for this PV appt-  No A fib or A flutter; Have any cardiac testing pending--NO Insurance verified during PV appt--- BCBS Pt can ambulate without assistance;  Pt denies use of chewing tobacco; Discussed diabetic/weight loss medication holds; Discussed NSAID holds; Checked BMI to be less than 50; Pt instructed to use Singlecare.com or GoodRx for a price reduction on prep;  Patient's chart reviewed by Cathlyn Parsons CNRA prior to previsit and patient appropriate for the [facility]; Pre visit completed and red dot placed by patient's name on their procedure day (on provider's schedule);    Instructions sent to MyChart per patient's request;

## 2023-07-11 DIAGNOSIS — N390 Urinary tract infection, site not specified: Secondary | ICD-10-CM | POA: Diagnosis not present

## 2023-07-15 ENCOUNTER — Ambulatory Visit: Payer: Medicare Other | Admitting: Student in an Organized Health Care Education/Training Program

## 2023-07-20 ENCOUNTER — Encounter: Payer: Self-pay | Admitting: Gastroenterology

## 2023-07-20 ENCOUNTER — Ambulatory Visit: Payer: Medicare Other | Admitting: Gastroenterology

## 2023-07-20 VITALS — BP 112/82 | HR 71 | Temp 97.5°F | Resp 12 | Ht 65.0 in | Wt 166.0 lb

## 2023-07-20 DIAGNOSIS — R1310 Dysphagia, unspecified: Secondary | ICD-10-CM

## 2023-07-20 MED ORDER — SODIUM CHLORIDE 0.9 % IV SOLN: 500.0000 mL | INTRAVENOUS | Status: DC

## 2023-07-20 NOTE — Patient Instructions (Signed)
Please read handouts provided. Continue present medications. Advance diet as tolerated. Follow-up as needed for recurrent symptoms.   YOU HAD AN ENDOSCOPIC PROCEDURE TODAY AT THE [facility]:   Refer to the procedure report that was given to you for any specific questions about what was found during the examination.  If the procedure report does not answer your questions, please call your gastroenterologist to clarify.  If you requested that your care partner not be given the details of your procedure findings, then the procedure report has been included in a sealed envelope for you to review at your convenience later.  YOU SHOULD EXPECT: Some feelings of bloating in the abdomen. Passage of more gas than usual.  Walking can help get rid of the air that was put into your GI tract during the procedure and reduce the bloating. If you had a lower endoscopy (such as a colonoscopy or flexible sigmoidoscopy) you may notice spotting of blood in your stool or on the toilet paper. If you underwent a bowel prep for your procedure, you may not have a normal bowel movement for a few days.  Please Note:  You might notice some irritation and congestion in your nose or some drainage.  This is from the oxygen used during your procedure.  There is no need for concern and it should clear up in a day or so.  SYMPTOMS TO REPORT IMMEDIATELY:  Following upper endoscopy (EGD)  Vomiting of blood or coffee ground material  New chest pain or pain under the shoulder blades  Painful or persistently difficult swallowing  New shortness of breath  Fever of 100F or higher  Black, tarry-looking stools  For urgent or emergent issues, a gastroenterologist can be reached at any hour by calling [phone]. Do not use MyChart messaging for urgent concerns.    DIET:  We do recommend a small meal at first, but then you may proceed to your regular diet.  Drink plenty of fluids but you should avoid alcoholic  beverages for 24 hours.  ACTIVITY:  You should plan to take it easy for the rest of today and you should NOT DRIVE or use heavy machinery until tomorrow (because of the sedation medicines used during the test).    FOLLOW UP: Our staff will call the number listed on your records the next business day following your procedure.  We will call around 7:15- 8:00 am to check on you and address any questions or concerns that you may have regarding the information given to you following your procedure. If we do not reach you, we will leave a message.     If any biopsies were taken you will be contacted by phone or by letter within the next 1-3 weeks.  Please call us at [phone] if you have not heard about the biopsies in 3 weeks.    SIGNATURES/CONFIDENTIALITY: You and/or your care partner have signed paperwork which will be entered into your electronic medical record.  These signatures attest to the fact that that the information above on your After Visit Summary has been reviewed and is understood.  Full responsibility of the confidentiality of this discharge information lies with you and/or your care-partner.

## 2023-07-20 NOTE — Progress Notes (Signed)
Sedate, gd SR, tolerated procedure well, VSS, report to RN 
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
## 2023-07-20 NOTE — Progress Notes (Signed)
Called to room to assist during endoscopic procedure.  Patient ID and intended procedure confirmed with present staff. Received instructions for my participation in the procedure from the performing physician.  

## 2023-07-20 NOTE — Op Note (Signed)
[facility] Patient Name: [name] Procedure Date: 07/20/2023 12:49 PM MRN: [id number] Endoscopist: Viviann Spare P. Adela Lank , MD, [id number] Age: 67 Referring MD:  Date of Birth: [birth year] Gender: Female Account #: [id number] Procedure:                Upper GI endoscopy Indications:              Dysphagia - recurrent, she had responded to empiric                            dilation in the past, last dilated 2023 Medicines:                Monitored Anesthesia Care Procedure:                Pre-Anesthesia Assessment:                           - Prior to the procedure, a History and Physical                            was performed, and patient medications and                            allergies were reviewed. The patient's tolerance of                            previous anesthesia was also reviewed. The risks                            and benefits of the procedure and the sedation                            options and risks were discussed with the patient.                            All questions were answered, and informed consent                            was obtained. Prior Anticoagulants: The patient has                            taken no anticoagulant or antiplatelet agents. ASA                            Grade Assessment: II - A patient with mild systemic                            disease. After reviewing the risks and benefits,                            the patient was deemed in satisfactory condition to                            undergo the procedure.  After obtaining informed consent, the endoscope was                            passed under direct vision. Throughout the                            procedure, the patient's blood pressure, pulse, and                            oxygen saturations were monitored continuously. The                            GIF [id number] #[id number] was introduced through the                            mouth,  and advanced to the second part of duodenum.                            The upper GI endoscopy was accomplished without                            difficulty. The patient tolerated the procedure                            well. Scope In: Scope Out: Findings:                 The Z-line was regular.                           A 1 cm hiatal hernia was present.                           The exam of the esophagus was otherwise normal. No                            focal stenosis / stricture appreciated.                           A guidewire was placed and the scope was withdrawn.                            Empiric dilation was performed in the entire                            esophagus with a Savary dilator with mild                            resistance at 17 mm and 18 mm. Relook endoscopy                            showed no mucosal wrent.                           The entire examined stomach was normal.  The examined duodenum was normal. Complications:            No immediate complications. Estimated blood loss:                            None. Estimated Blood Loss:     Estimated blood loss: none. Impression:               - Z-line regular.                           - 1 cm hiatal hernia.                           - Normal esophagus otherwise - empiric dilation                            performed to 18mm.                           - Normal stomach.                           - Normal examined duodenum. Recommendation:           - Patient has a contact number available for                            emergencies. The signs and symptoms of potential                            delayed complications were discussed with the                            patient. Return to normal activities tomorrow.                            Written discharge instructions were provided to the                            patient.                           - Advance diet as tolerated.                            - Continue present medications.                           - Await course post dilation, follow up as needed                            for recurrent symptoms Viviann Spare P. Edwina Grossberg, MD 07/20/2023 1:39:05 PM This report has been signed electronically.

## 2023-07-21 ENCOUNTER — Telehealth: Payer: Self-pay

## 2023-07-21 NOTE — Telephone Encounter (Signed)
  Follow up Call-     07/20/2023   12:58 PM 01/10/2022    1:32 PM  Call back number  Post procedure Call Back phone  # [phone]  Permission to leave phone message Yes Yes     Patient questions:  Do you have a fever, pain , or abdominal swelling? No. Pain Score  0 *  Have you tolerated food without any problems? Yes.    Have you been able to return to your normal activities? Yes.    Do you have any questions about your discharge instructions: Diet   No. Medications  No. Follow up visit  No.  Do you have questions or concerns about your Care? No.  Actions: * If pain score is 4 or above: No action needed, pain <4.

## 2023-07-27 ENCOUNTER — Ambulatory Visit (INDEPENDENT_AMBULATORY_CARE_PROVIDER_SITE_OTHER): Payer: Medicare Other | Admitting: Family Medicine

## 2023-07-27 ENCOUNTER — Encounter: Payer: Self-pay | Admitting: Family Medicine

## 2023-07-27 VITALS — BP 104/70 | HR 71 | Temp 97.9°F | Resp 16 | Ht 65.0 in | Wt 169.4 lb

## 2023-07-27 DIAGNOSIS — Z23 Encounter for immunization: Secondary | ICD-10-CM | POA: Diagnosis not present

## 2023-07-27 DIAGNOSIS — E039 Hypothyroidism, unspecified: Secondary | ICD-10-CM | POA: Diagnosis not present

## 2023-07-27 DIAGNOSIS — Z9882 Breast implant status: Secondary | ICD-10-CM

## 2023-07-27 DIAGNOSIS — N644 Mastodynia: Secondary | ICD-10-CM

## 2023-07-27 NOTE — Progress Notes (Signed)
SUBJECTIVE:   Chief Complaint  Patient presents with   Breast Pain    Left side X 4 days   HPI Presents to clinic for acute visit  Discussed the use of AI scribe software for clinical note transcription with the patient, who gave verbal consent to proceed.  History of Present Illness The patient, with a history of breast implants and recent esophagogastroduodenoscopy (EGD), presents with left breast pain that started on a Thursday, a few days after the EGD procedure. The pain was severe and raised concerns due to the patient's breast implants, which have been in place for approximately 17-18 years. The patient denies any palpable lumps or changes in the breast, but notes that the painful breast feels larger than the other. There is no associated nipple discharge, redness, or swelling.  The patient has been actively working out, focusing on regaining mobility after a spine surgery last year. The patient worked out the day after the pain started and recalls possibly doing different exercises that day, raising the possibility of a musculoskeletal cause for the pain.  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 The patient is also on omeprazole and has been advised to change her diet to manage the hernia symptoms. The patient's thyroid levels were reported to be high despite being on 200 mcg of thyroid medication, and the patient is compliant with the medication regimen.  The breast pain has been improving, and the patient is considering waiting a few more days to see if it resolves completely before pursuing further imaging. The patient denies any recent trauma or injury to the breast. The patient's last mammogram was in March, and no abnormalities were found.    PERTINENT PMH / PSH: As above  OBJECTIVE:  BP 104/70   Pulse 71   Temp 97.9 F (36.6 C)   Resp 16   Ht 5\' 5"  (1.651 m)   Wt 169 lb 6 oz (76.8 kg)    SpO2 99%   BMI 28.19 kg/m    Physical Exam Vitals reviewed. Exam conducted with a chaperone present.  Constitutional:      General: She is not in acute distress.    Appearance: Normal appearance. She is normal weight. She is not ill-appearing, toxic-appearing or diaphoretic.  Eyes:     General:        Right eye: No discharge.        Left eye: No discharge.     Conjunctiva/sclera: Conjunctivae normal.  Cardiovascular:     Rate and Rhythm: Normal rate.  Pulmonary:     Effort: Pulmonary effort is normal.  Chest:     Chest wall: No mass or deformity.  Breasts:    Right: Inverted nipple present. No swelling, nipple discharge, skin change or tenderness.     Left: Inverted nipple and tenderness present. No swelling, mass, nipple discharge or skin change.    Musculoskeletal:        General: Normal range of motion.  Lymphadenopathy:     Upper Body:     Right upper body: No supraclavicular, axillary or pectoral adenopathy.     Left upper body: No supraclavicular, axillary or pectoral adenopathy.  Skin:    General: Skin is warm and dry.  Neurological:     General: No focal deficit present.     Mental Status: She is alert and oriented to person, place, and time. Mental status is at  baseline.  Psychiatric:        Mood and Affect: Mood normal.        Behavior: Behavior normal.        Thought Content: Thought content normal.        Judgment: Judgment normal.        07/27/2023    1:26 PM 06/09/2023    1:52 PM 04/09/2023    1:12 PM 02/04/2023    8:20 AM 12/03/2022   10:45 AM  Depression screen PHQ 2/9  Decreased Interest 1 1 0 2 0  Down, Depressed, Hopeless 1 1 0 2 0  PHQ - 2 Score 2 2 0 4 0  Altered sleeping 3 3  3    Tired, decreased energy 0 1  1   Change in appetite 3 3  3    Feeling bad or failure about yourself  1 3  3    Trouble concentrating 1 3  3    Moving slowly or fidgety/restless 0 0  0   Suicidal thoughts 0 0  0   PHQ-9 Score 10 15  17    Difficult doing  work/chores Not difficult at all Not difficult at all  Not difficult at all       07/27/2023    1:26 PM 06/09/2023    1:52 PM 02/04/2023    8:20 AM 11/05/2022    2:03 PM  GAD 7 : Generalized Anxiety Score  Nervous, Anxious, on Edge 1 1 1 3   Control/stop worrying 0 1 3 3   Worry too much - different things 1 1 1 3   Trouble relaxing 0 1 0 1  Restless 0 1 0 0  Easily annoyed or irritable 0 1 0 0  Afraid - awful might happen 0 1 1 1   Total GAD 7 Score 2 7 6 11   Anxiety Difficulty Not difficult at all Not difficult at all Not difficult at all Not difficult at all    ASSESSMENT/PLAN:  Breast pain, left Assessment & Plan: Onset last Thursday, possibly related to recent EGD procedure or gym activities. No changes in breast size, nipple discharge, redness, or swelling. Pain is improving. Saline implants in place for 17-18 years. Mammogram in March was normal.  Suspect MSK, given pain reproducible and improving. -Consider use of Diclofenac gel for muscle pain related to gym activities. -Observe for a few more days. If pain resolves, no further action. If pain persists or worsens, order a mammogram.   Need for influenza vaccination -     Flu Vaccine Trivalent High Dose (Fluad)  Encounter for immunization -     Pneumococcal conjugate vaccine 20-valent  Hypothyroidism, unspecified type Assessment & Plan: On Levothyroxine daily, increased from 175 mcg. Recent lab results reportedly showed high TSH. -Follow up with PCP for adjustment.     H/O bilateral breast implants   PDMP reviewed  Return if symptoms worsen or fail to improve, for PCP.  Dana Allan, MD

## 2023-07-27 NOTE — Patient Instructions (Addendum)
It was a pleasure meeting you today. Thank you for allowing me to take part in your health care.  Our goals for today as we discussed include:   Tylenol 500 mg every 6 hours as needed Diclofenac gel every 6 hours as needed Heat/Ice as needed Massage therapy Avoid heavy lifting fir next few days   If pain continues to worsen will send for Mammogram.     Flu vaccine today  Pneumonia 20 vaccine today.  Series now completed   This is a list of the screening recommended for you and due dates:  Health Maintenance  Topic Date Due   Medicare Annual Wellness Visit  12/20/2022   Pneumonia Vaccine (2 of 2 - PCV) 02/17/2023   Flu Shot  04/23/2023   COVID-19 Vaccine (4 - 2023-24 season) 05/24/2023   Mammogram  11/26/2024   Pap with HPV screening  06/08/2028   Colon Cancer Screening  04/13/2030   DTaP/Tdap/Td vaccine (2 - Tdap) 11/25/2032   DEXA scan (bone density measurement)  Completed   Hepatitis C Screening  Completed   HIV Screening  Completed   Zoster (Shingles) Vaccine  Completed   HPV Vaccine  Aged Out      If you have any questions or concerns, please do not hesitate to call the office at [phone].  I look forward to our next visit and until then take care and stay safe.  Regards,   Dana Allan, MD   [facility]

## 2023-07-29 ENCOUNTER — Ambulatory Visit: Payer: Medicare Other | Admitting: Student in an Organized Health Care Education/Training Program

## 2023-08-01 ENCOUNTER — Encounter: Payer: Self-pay | Admitting: Family Medicine

## 2023-08-01 DIAGNOSIS — Z23 Encounter for immunization: Secondary | ICD-10-CM | POA: Insufficient documentation

## 2023-08-01 NOTE — Assessment & Plan Note (Signed)
On Levothyroxine daily, increased from 175 mcg. Recent lab results reportedly showed high TSH. -Follow up with PCP for adjustment.

## 2023-08-01 NOTE — Assessment & Plan Note (Addendum)
Onset last Thursday, possibly related to recent EGD procedure or gym activities. No changes in breast size, nipple discharge, redness, or swelling. Pain is improving. Saline implants in place for 17-18 years. Mammogram in March was normal.  Suspect MSK, given pain reproducible and improving. -Consider use of Diclofenac gel for muscle pain related to gym activities. -Observe for a few more days. If pain resolves, no further action. If pain persists or worsens, order a mammogram.

## 2023-08-11 ENCOUNTER — Telehealth: Payer: Self-pay | Admitting: Nurse Practitioner

## 2023-08-11 ENCOUNTER — Other Ambulatory Visit (INDEPENDENT_AMBULATORY_CARE_PROVIDER_SITE_OTHER): Payer: Medicare Other

## 2023-08-11 DIAGNOSIS — E039 Hypothyroidism, unspecified: Secondary | ICD-10-CM

## 2023-08-11 NOTE — Telephone Encounter (Signed)
Patient wanted to let Dr. Clent Ridges know that pain in her left breast is still off and on. She wanted to know if she can get a Mammogram called in. Her number is [phone].

## 2023-08-12 ENCOUNTER — Other Ambulatory Visit: Payer: Self-pay

## 2023-08-12 DIAGNOSIS — N644 Mastodynia: Secondary | ICD-10-CM

## 2023-08-12 DIAGNOSIS — Z9882 Breast implant status: Secondary | ICD-10-CM

## 2023-08-12 LAB — TSH+T4F+T3FREE+THYABS+TPO+VD25
Free T4: 1.75 ng/dL (ref 0.82–1.77)
T3, Free: 2.8 pg/mL (ref 2.0–4.4)
TSH: 4.51 u[IU]/mL — ABNORMAL HIGH (ref 0.450–4.500)
Thyroglobulin Antibody: 6.9 [IU]/mL — ABNORMAL HIGH (ref 0.0–0.9)
Thyroperoxidase Ab SerPl-aCnc: 80 [IU]/mL — ABNORMAL HIGH (ref 0–34)
Vit D, 25-Hydroxy: 73.7 ng/mL (ref 30.0–100.0)

## 2023-08-12 NOTE — Telephone Encounter (Signed)
Referral placed.

## 2023-08-13 ENCOUNTER — Telehealth: Payer: Self-pay | Admitting: Nurse Practitioner

## 2023-08-13 ENCOUNTER — Other Ambulatory Visit: Payer: Self-pay | Admitting: Family Medicine

## 2023-08-13 DIAGNOSIS — N644 Mastodynia: Secondary | ICD-10-CM

## 2023-08-13 DIAGNOSIS — Z9882 Breast implant status: Secondary | ICD-10-CM

## 2023-08-13 NOTE — Telephone Encounter (Signed)
Pt called stating she would like to know her lab results 

## 2023-08-13 NOTE — Telephone Encounter (Signed)
Pt has been called

## 2023-08-17 ENCOUNTER — Telehealth: Payer: Self-pay | Admitting: Nurse Practitioner

## 2023-08-17 NOTE — Telephone Encounter (Signed)
Patient called about her referral for a mammogram. She saw Dr Clent Ridges for a issue with breast. Patient states Dr Clent Ridges put a referral in. Is there any way since Dr Clent Ridges is out that patient's provider Ramond Marrow can do the referral.

## 2023-08-17 NOTE — Telephone Encounter (Signed)
Detailed VM left informing pt that Dr. Clent Ridges referred her to [facility] and provided the phone number [phone] and that she should be able to call and get scheduled as the orders have been placed by [facility].

## 2023-08-18 ENCOUNTER — Other Ambulatory Visit: Payer: Self-pay | Admitting: Nurse Practitioner

## 2023-08-18 DIAGNOSIS — N644 Mastodynia: Secondary | ICD-10-CM

## 2023-08-18 NOTE — Telephone Encounter (Signed)
Provider sent orders.

## 2023-08-18 NOTE — Telephone Encounter (Signed)
Patient called office back. [name] needs a different referral for Left breast mammogram and sonogram. Patient states that [name] did fax over request, but with Dr Clent Ridges being out if has not been signed.

## 2023-08-23 DIAGNOSIS — Z20822 Contact with and (suspected) exposure to covid-19: Secondary | ICD-10-CM | POA: Diagnosis not present

## 2023-08-23 DIAGNOSIS — R059 Cough, unspecified: Secondary | ICD-10-CM | POA: Diagnosis not present

## 2023-08-23 DIAGNOSIS — M545 Low back pain, unspecified: Secondary | ICD-10-CM | POA: Diagnosis not present

## 2023-08-26 ENCOUNTER — Ambulatory Visit
Admission: RE | Admit: 2023-08-26 | Discharge: 2023-08-26 | Disposition: A | Discharge status: Home / Self Care | Payer: Medicare Other | Source: Ambulatory Visit | Attending: Nurse Practitioner | Admitting: Nurse Practitioner

## 2023-08-26 DIAGNOSIS — R92323 Mammographic fibroglandular density, bilateral breasts: Secondary | ICD-10-CM | POA: Diagnosis not present

## 2023-08-26 DIAGNOSIS — N644 Mastodynia: Secondary | ICD-10-CM | POA: Insufficient documentation

## 2023-08-26 DIAGNOSIS — M79622 Pain in left upper arm: Secondary | ICD-10-CM | POA: Diagnosis not present

## 2023-08-27 ENCOUNTER — Ambulatory Visit: Payer: Medicare Other | Admitting: Student in an Organized Health Care Education/Training Program

## 2023-08-27 ENCOUNTER — Ambulatory Visit
Admission: RE | Admit: 2023-08-27 | Discharge: 2023-08-27 | Disposition: A | Discharge status: Home / Self Care | Payer: Medicare Other | Source: Ambulatory Visit | Attending: Student in an Organized Health Care Education/Training Program | Admitting: Student in an Organized Health Care Education/Training Program

## 2023-08-27 ENCOUNTER — Encounter: Payer: Self-pay | Admitting: Student in an Organized Health Care Education/Training Program

## 2023-08-27 VITALS — BP 108/80 | HR 80 | Temp 97.0°F | Resp 16 | Ht 65.0 in | Wt 167.0 lb

## 2023-08-27 DIAGNOSIS — M47817 Spondylosis without myelopathy or radiculopathy, lumbosacral region: Secondary | ICD-10-CM | POA: Diagnosis not present

## 2023-08-27 DIAGNOSIS — M47816 Spondylosis without myelopathy or radiculopathy, lumbar region: Secondary | ICD-10-CM | POA: Diagnosis not present

## 2023-08-27 DIAGNOSIS — G5701 Lesion of sciatic nerve, right lower limb: Secondary | ICD-10-CM

## 2023-08-27 DIAGNOSIS — M5416 Radiculopathy, lumbar region: Secondary | ICD-10-CM | POA: Insufficient documentation

## 2023-08-27 DIAGNOSIS — G5711 Meralgia paresthetica, right lower limb: Secondary | ICD-10-CM | POA: Insufficient documentation

## 2023-08-27 DIAGNOSIS — M545 Low back pain, unspecified: Secondary | ICD-10-CM | POA: Diagnosis not present

## 2023-08-27 DIAGNOSIS — M4316 Spondylolisthesis, lumbar region: Secondary | ICD-10-CM | POA: Diagnosis not present

## 2023-08-27 DIAGNOSIS — G8929 Other chronic pain: Secondary | ICD-10-CM | POA: Insufficient documentation

## 2023-08-27 DIAGNOSIS — G894 Chronic pain syndrome: Secondary | ICD-10-CM | POA: Insufficient documentation

## 2023-08-27 DIAGNOSIS — M5412 Radiculopathy, cervical region: Secondary | ICD-10-CM

## 2023-08-27 DIAGNOSIS — M461 Sacroiliitis, not elsewhere classified: Secondary | ICD-10-CM

## 2023-08-27 DIAGNOSIS — M533 Sacrococcygeal disorders, not elsewhere classified: Secondary | ICD-10-CM | POA: Diagnosis not present

## 2023-08-27 MED ORDER — METHYLPREDNISOLONE 4 MG PO TBPK: ORAL_TABLET | ORAL | 0 refills | Status: AC
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
## 2023-08-27 NOTE — Progress Notes (Signed)
Safety precautions to be maintained throughout the outpatient stay will include: orient to surroundings, keep bed in low position, maintain call bell within reach at all times, provide assistance with transfer out of bed and ambulation.  

## 2023-09-03 ENCOUNTER — Other Ambulatory Visit: Payer: Self-pay | Admitting: Nurse Practitioner

## 2023-09-03 DIAGNOSIS — E039 Hypothyroidism, unspecified: Secondary | ICD-10-CM

## 2023-09-09 ENCOUNTER — Telehealth: Payer: Self-pay | Admitting: Nurse Practitioner

## 2023-09-09 NOTE — Telephone Encounter (Signed)
PATIENT HAS A ONGOING COUGH THAT HAS LASTED SINCE THANKSGIVING SHE STATED THAT THE COUGH IS KEEPING HER UP AT NIGHT AND SHE NEEDS A APPT SOONER THEN WHAT IS AVAILABLE  SHE WOULD LIKE A CALL BACK

## 2023-09-17 IMAGING — MR MR CERVICAL SPINE W/O CM
5 series · 35 of 48 positions shown · non-contrast
Comparison: None.

CLINICAL DATA: Cervicalgia. Additional history provided by scanning
gradually worsening, history of prior cervical fusion in 2285.

EXAM:
MRI CERVICAL SPINE WITHOUT CONTRAST
TECHNIQUE: Multiplanar, multisequence MR imaging of the cervical spine was
performed. No intravenous contrast was administered.

[Series 5: T2 · sagittal · 3.0mm · 0.62mm/px · 6 of 15 slices shown (1 of 2)]
[im 1/15]
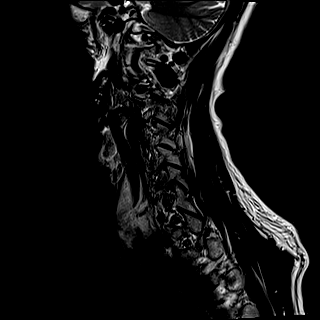
[im 3/15]
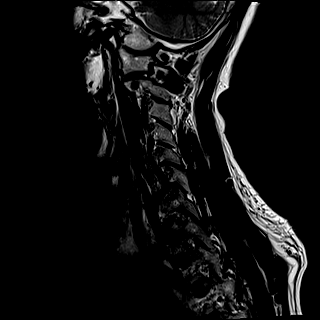
[im 6/15]
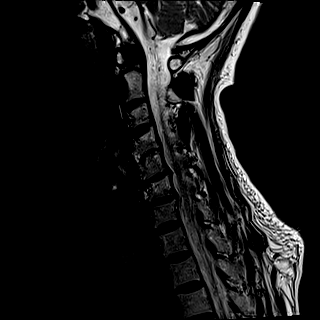
[im 9/15]
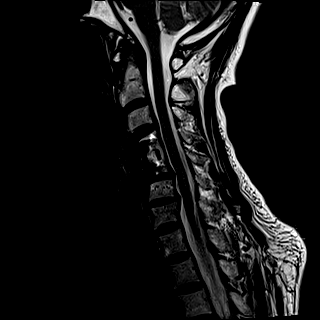
[im 12/15]
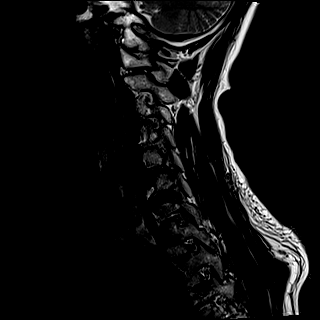
[im 15/15]
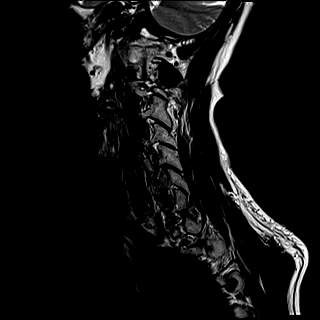

[Series 6: FLAIR · sagittal · 3.0mm · 0.78mm/px · 7 of 15 slices shown]
[im 1/15]
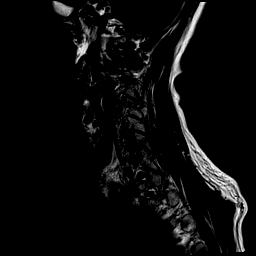
[im 3/15]
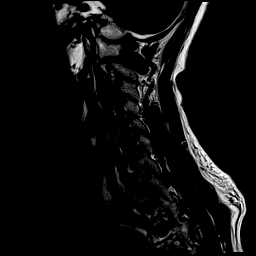
[im 5/15]
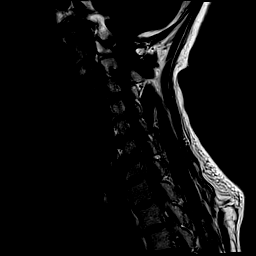
[im 8/15]
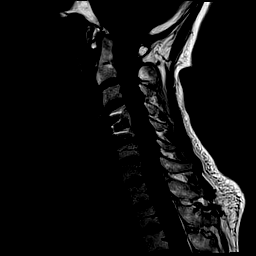
[im 10/15]
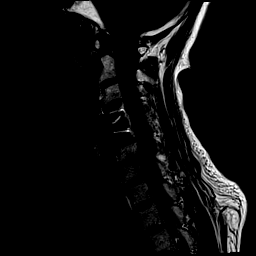
[im 12/15]
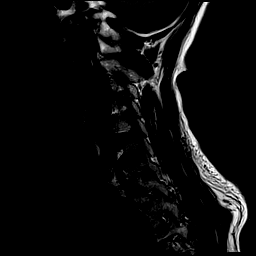
[im 15/15]
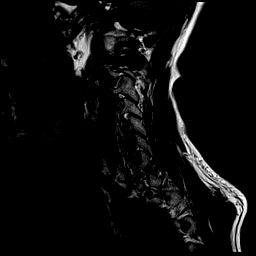

[Series 7: STIR · sagittal · 3.0mm · 0.62mm/px · 7 of 15 slices shown]
[im 1/15]
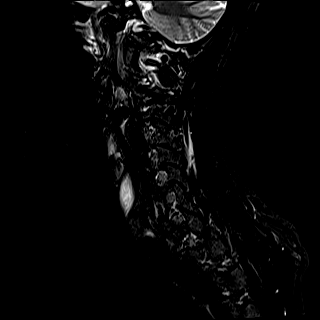
[im 3/15]
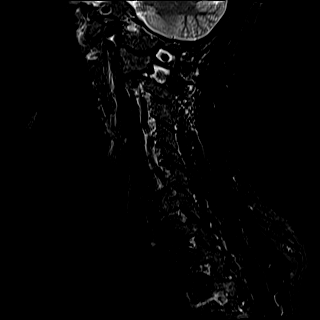
[im 5/15]
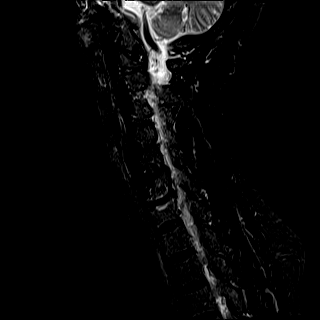
[im 8/15]
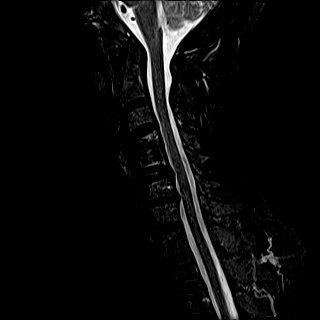
[im 10/15]
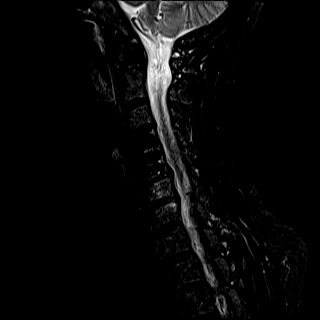
[im 12/15]
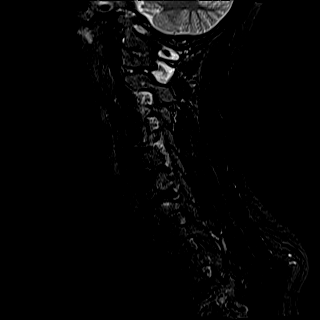
[im 15/15]
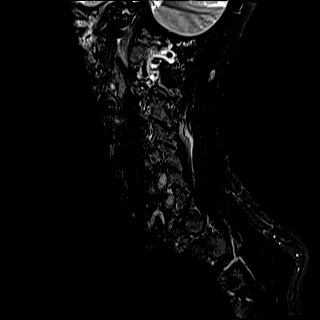

[Series 8: T2 · axial · 3.0mm · 0.70mm/px · z∈[-199,-102]mm · 8 of 29 slices shown (2 of 2)]
[im 1/29]
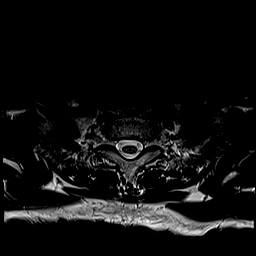
[im 5/29]
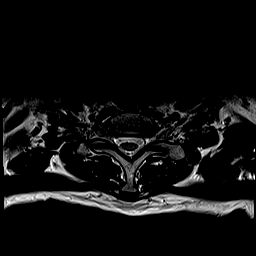
[im 9/29]
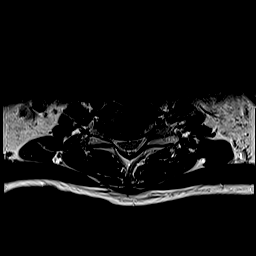
[im 13/29]
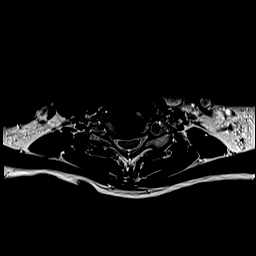
[im 16/29]
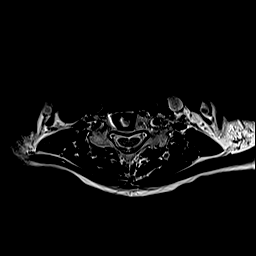
[im 20/29]
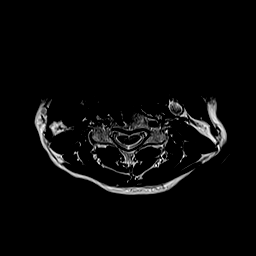
[im 24/29]
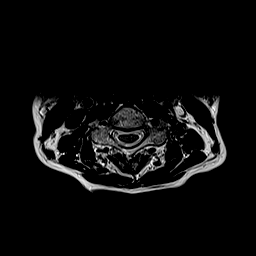
[im 29/29]
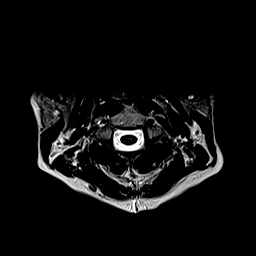

[Series 9: ax mpgr · axial · 3.0mm · 0.35mm/px · z∈[-199,-120]mm · 7 of 29 slices shown]
[im 1/29]
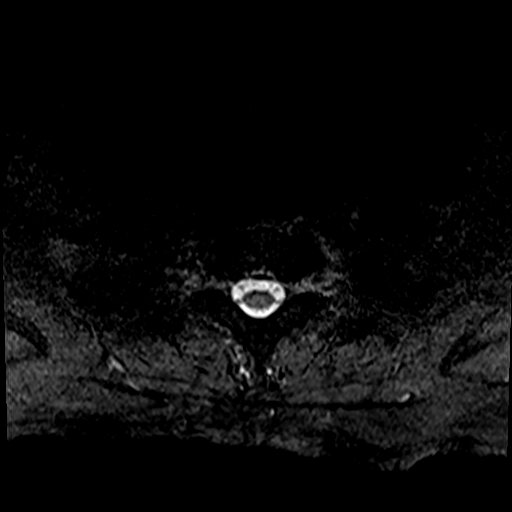
[im 5/29]
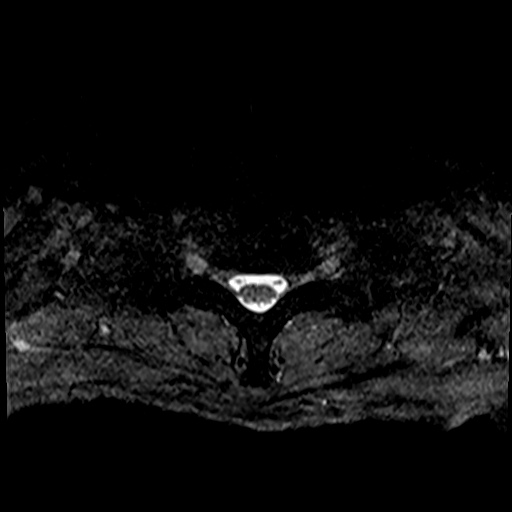
[im 9/29]
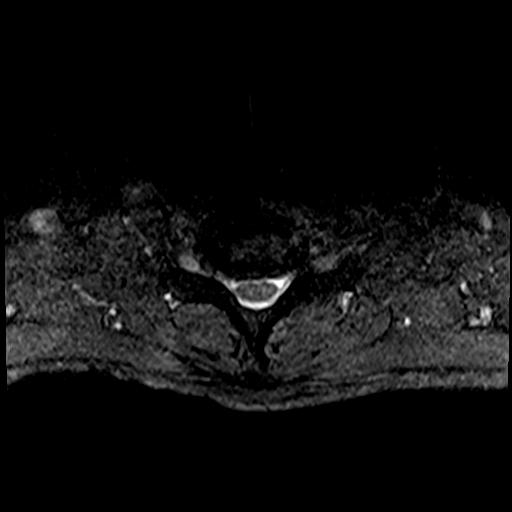
[im 13/29]
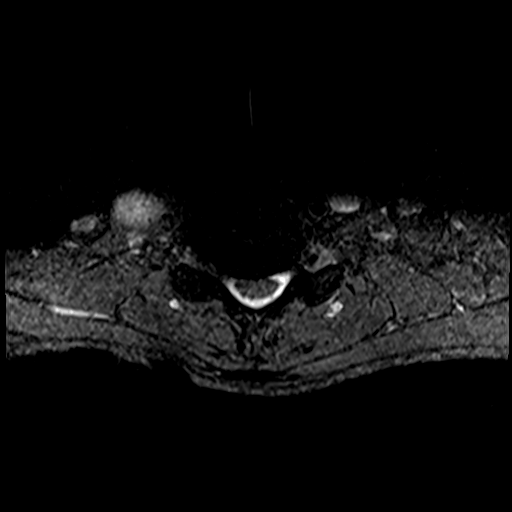
[im 16/29]
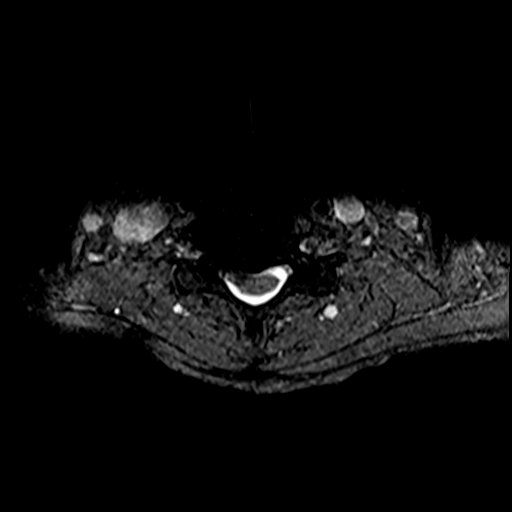
[im 20/29]
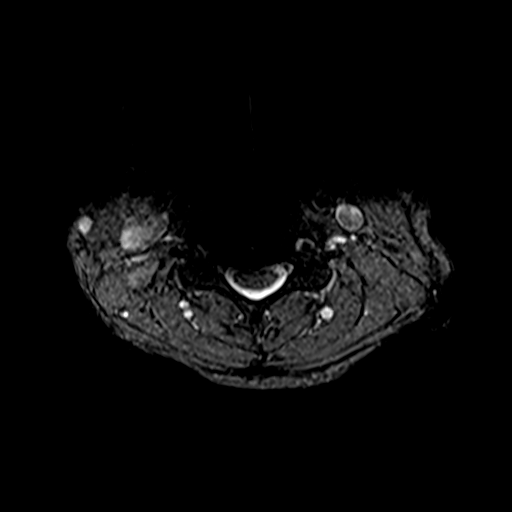
[im 24/29]
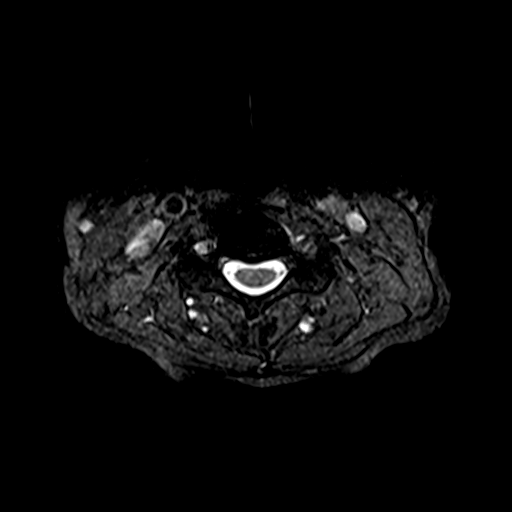

[35 of 48 positions shown; findings below may reference images not displayed]

FINDINGS: Alignment: Straightening of the expected cervical lordosis. Trace
C3-C4 grade 1 anterolisthesis.

Vertebrae: Susceptibility artifact arising from ACDF hardware at the
C4-C5 level. Vertebral body height is maintained. Mild degenerative
endplate edema at C3-C4 and C6-C7. Minimal degenerative endplate
edema at C5-C6.

Cord: No signal abnormality identified within the cervical spinal
cord.

Posterior Fossa, vertebral arteries, paraspinal tissues: No
abnormality identified within included portions of the posterior
fossa. Flow voids preserved within the imaged cervical vertebral
arteries. Paraspinal soft tissues unremarkable.

Disc levels:

Disc degeneration is greatest at C5-C6 (moderate) and C6-C7
(mild-to-moderate).

C2-C3: No significant disc herniation or stenosis.

C3-C4: Trace grade 1 anterolisthesis. Shallow broad-based central
disc protrusion. The disc protrusion results in mild focal
effacement of the ventral thecal sac, and may contact the ventral
spinal cord. No significant foraminal stenosis.

C4-C5: Prior ACDF. No significant spinal canal or foraminal
stenosis.

C5-C6: Disc bulge with endplate spurring and bilateral uncovertebral
hypertrophy. The disc bulge effaces the ventral thecal sac,
contacting and minimally flattening the ventral aspect of the spinal
cord. However, the dorsal CSF space is maintained within the spinal
canal. Bilateral neural foraminal narrowing (severe right, moderate
left).

C6-C7: Disc bulge with bilateral disc osteophyte ridge/uncinate
hypertrophy. The disc bulge effaces the ventral thecal sac and
likely contacts the ventral aspect of the spinal cord. Bilateral
neural foraminal narrowing (severe right, moderate left).

C7-T1: No significant disc herniation or stenosis.
IMPRESSION: Prior C4-C5 ACDF. No significant spinal canal or foraminal stenosis
at this level.

Cervical spondylosis, as outlined and with findings most notably as
follows.

At C5-C6, there is moderate disc degeneration. Disc bulge with
endplate spurring and bilateral uncovertebral hypertrophy. The disc
bulge effaces the ventral thecal sac, contacting and minimally
flattening the ventral aspect of the spinal cord. However, the
dorsal CSF space is maintained within the spinal canal. Bilateral
neural foraminal narrowing (severe right, moderate left).

At C6-C7, there is mild-to-moderate disc degeneration. Disc bulge
with bilateral disc osteophyte ridge/uncinate hypertrophy. The disc
bulge effaces the ventral thecal sac and likely contacts the ventral
aspect of the spinal cord. Bilateral neural foraminal narrowing
(severe right, moderate left).

At C3-C4, a shallow broad-based central disc protrusion results in
mild focal effacement of the ventral thecal sac and may contact the
ventral aspect of the spinal cord.

Mild multilevel degenerative endplate edema, greatest at C6-C7.

Nonspecific straightening of the expected cervical lordosis.

## 2023-09-21 ENCOUNTER — Encounter: Payer: Self-pay | Admitting: Student in an Organized Health Care Education/Training Program

## 2023-09-21 ENCOUNTER — Ambulatory Visit
Payer: Medicare Other | Attending: Student in an Organized Health Care Education/Training Program | Admitting: Student in an Organized Health Care Education/Training Program

## 2023-09-21 VITALS — BP 110/74 | HR 77 | Temp 97.3°F | Resp 16 | Ht 65.0 in | Wt 169.0 lb

## 2023-09-21 DIAGNOSIS — M7918 Myalgia, other site: Secondary | ICD-10-CM | POA: Diagnosis not present

## 2023-09-21 DIAGNOSIS — G894 Chronic pain syndrome: Secondary | ICD-10-CM | POA: Insufficient documentation

## 2023-09-21 MED ORDER — ROPIVACAINE HCL 2 MG/ML IJ SOLN
9.0000 mL | Freq: Once | INTRAMUSCULAR | Status: AC
  Administered 2023-09-21: 9 mL via INTRA_ARTICULAR
  Filled 2023-09-21: qty 20

## 2023-09-21 MED ORDER — METHYLPREDNISOLONE ACETATE 80 MG/ML IJ SUSP
80.0000 mg | Freq: Once | INTRAMUSCULAR | Status: DC
  Filled 2023-09-21: qty 1

## 2023-09-21 MED ORDER — DEXAMETHASONE SODIUM PHOSPHATE 10 MG/ML IJ SOLN: 10.0000 mg | Freq: Once | INTRAMUSCULAR | Status: DC

## 2023-09-21 NOTE — Progress Notes (Signed)
Safety precautions to be maintained throughout the outpatient stay will include: orient to surroundings, keep bed in low position, maintain call bell within reach at all times, provide assistance with transfer out of bed and ambulation.  

## 2023-09-21 NOTE — Patient Instructions (Signed)

## 2023-09-21 NOTE — Progress Notes (Signed)
PROVIDER NOTE: Interpretation of information contained herein should be left to medically-trained personnel. Specific patient instructions are provided elsewhere under "Patient Instructions" section of medical record. This document was created in part using STT-dictation technology, any transcriptional errors that may result from this process are unintentional.  Patient: [name] Type: Established DOB: [birth year] MRN: [id number] PCP: Bethanie Dicker, NP  Service: Procedure DOS: 09/21/2023 Setting: Ambulatory Location: Ambulatory outpatient facility Delivery: Face-to-face Provider: Edward Jolly, MD Specialty: Interventional Pain Management Specialty designation: 09 Location: Outpatient facility Ref. Prov.: Bethanie Dicker, NP       Interventional Therapy   Type:  LumbarTrigger Point Injection (Myoneural Block) (3+ muscle groups)  #1 (w/ steroids)  CPT: 20553 Laterality: Bilateral (-50)   Imaging: N/A. Landmark-guided",           Anesthesia: Local anesthesia (1-2% Lidocaine) DOS: 09/21/2023  Performed by: Edward Jolly, MD  Medical Necessity (reasoning)  Purpose: Diagnostic/Therapeutic Rationale (medical necessity): procedure needed and proper for the diagnosis and/or treatment of Ms. Almanza's medical symptoms and needs. Indications: pain severe enough to impact quality of life and/or function. 1. 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 Patient assisted into a comfortable position. Pressure points checked.  Prep solution: ChloraPrep (2% chlorhexidine gluconate and 70% isopropyl alcohol) The target area was identified and the area prepped in the usual manner.  Prep Area: lumbosacral spine Materials:   Tray: Block Needle(s):  Type: Regular  Gauge (G): 22  Length:  1.5-in  Qty: 1  H&P (Pre-op Assessment):  [name] is a 67 y.o. (year old), female patient, seen today for interventional treatment. She  has a past surgical history that includes Back surgery; Breast surgery; Augmentation mammaplasty (Bilateral, 2006); Esophagogastroduodenoscopy (egd) with propofol (N/A, 10/24/2020); Esophagogastroduodenoscopy (egd) with propofol (N/A, 12/20/2020); Esophageal manometry (N/A, 03/04/2021); 24 hour ph study (N/A, 03/04/2021); PH impedance study (N/A, 03/04/2021); Vein Surgery (1991); Upper gastrointestinal endoscopy; Colonoscopy; Anterior cervical decomp/discectomy fusion (N/A, 05/12/2022); and Esophagogastroduodenoscopy (egd) with propofol (N/A, 11/11/2022). Ms. Tillerson has a current medication list which includes the following prescription(s): amlodipine, aspirin, atorvastatin, calcium, cyanocobalamin, famotidine, levothyroxine, multiple vitamin, omeprazole, probiotic product, triamterene-hydrochlorothiazide, turmeric, vitamin d, cyclobenzaprine, ibuprofen, and [DISCONTINUED] trazodone, and the following Facility-Administered Medications: dexamethasone. Her primarily concern today is the Back Pain (Bilateral lumbar )  Initial Vital Signs:  Pulse/HCG Rate: 77  Temp: (!) 97.3 F (36.3 C) Resp: 16 BP: 110/74 SpO2: 100 %  BMI: Estimated body mass index is 28.12 kg/m as calculated from the following:   Height as of this encounter: 5\' 5"  (1.651 m).   Weight as of this encounter: 169 lb (76.7 kg).  Risk Assessment: Allergies: Reviewed. She has no known allergies.  Allergy Precautions: None required Coagulopathies: Reviewed. None identified.  Blood-thinner therapy: None at this time Active Infection(s): Reviewed. None identified. [name] is afebrile  Site Confirmation: Ms. Bruegger was asked to confirm the procedure and laterality before marking the site Procedure checklist: Completed Consent: Before the procedure and under the influence of no sedative(s),  amnesic(s), or anxiolytics, the patient was informed of the treatment options, risks and possible complications. To fulfill our ethical and legal obligations, as recommended by the American Medical Association's Code of Ethics, I have informed the patient of my clinical impression; the nature and  purpose of the treatment or procedure; the risks, benefits, and possible complications of the intervention; the alternatives, including doing nothing; the risk(s) and benefit(s) of the alternative treatment(s) or procedure(s); and the risk(s) and benefit(s) of doing nothing. The patient was provided information about the general risks and possible complications associated with the procedure. These may include, but are not limited to: failure to achieve desired goals, infection, bleeding, organ or nerve damage, allergic reactions, paralysis, and death. In addition, the patient was informed of those risks and complications associated to the procedure, such as failure to decrease pain; infection; bleeding; organ or nerve damage with subsequent damage to sensory, motor, and/or autonomic systems, resulting in permanent pain, numbness, and/or weakness of one or several areas of the body; allergic reactions; (i.e.: anaphylactic reaction); and/or death. Furthermore, the patient was informed of those risks and complications associated with the medications. These include, but are not limited to: allergic reactions (i.e.: anaphylactic or anaphylactoid reaction(s)); adrenal axis suppression; blood sugar elevation that in diabetics may result in ketoacidosis or comma; water retention that in patients with history of congestive heart failure may result in shortness of breath, pulmonary edema, and decompensation with resultant heart failure; weight gain; swelling or edema; medication-induced neural toxicity; particulate matter embolism and blood vessel occlusion with resultant organ, and/or nervous system infarction; and/or aseptic  necrosis of one or more joints. Finally, the patient was informed that Medicine is not an exact science; therefore, there is also the possibility of unforeseen or unpredictable risks and/or possible complications that may result in a catastrophic outcome. The patient indicated having understood very clearly. We have given the patient no guarantees and we have made no promises. Enough time was given to the patient to ask questions, all of which were answered to the patient's satisfaction. Ms. Smiling has indicated that she wanted to continue with the procedure. Attestation: I, the ordering provider, attest that I have discussed with the patient the benefits, risks, side-effects, alternatives, likelihood of achieving goals, and potential problems during recovery for the procedure that I have provided informed consent. Date  Time: 09/21/2023 11:53 AM   Pre-Procedure Preparation:  Monitoring: As per clinic protocol. Respiration, ETCO2, SpO2, BP, heart rate and rhythm monitor placed and checked for adequate function Safety Precautions: Patient was assessed for positional comfort and pressure points before starting the procedure. Time-out: I initiated and conducted the "Time-out" before starting the procedure, as per protocol. The patient was asked to participate by confirming the accuracy of the "Time Out" information. Verification of the correct person, site, and procedure were performed and confirmed by me, the nursing staff, and the patient. "Time-out" conducted as per Joint Commission's Universal Protocol (UP.01.01.01). Time: 1251 Start Time: 1251 hrs.   Narrative                Start Time: 1251 hrs.  Standard Safety Precautions: Protocol guidelines were followed. Aspiration was conducted prior to injection. At no point did I inject any substances, as a needle was being advanced. No attempts were made at seeking a paresthesia. Safe injection practices and needle disposal techniques used. Medications  properly checked for expiration dates. SDV (single dose vial) medications used.  Local Anesthesia: Skin & deeper tissues infiltrated with local anesthetic. Appropriate amount of time allowed for local anesthetics to take effect.   Technical description:  The target area was identified and the area prepped in the usual manner. The procedure needles were then advanced to the target area. Proper needle placement secured. Negative aspiration  confirmed. Solution injected in intermittent fashion, asking for systemic symptoms every 0.5cc of injectate. The needles were then removed and the area cleansed, making sure to leave some of the prepping solution back to take advantage of its long term bactericidal properties.  Vitals:   09/21/23 1154  BP: 110/74  Pulse: 77  Resp: 16  Temp: (!) 97.3 F (36.3 C)  TempSrc: Temporal  SpO2: 100%  Weight: 169 lb (76.7 kg)  Height: 5\' 5"  (1.651 m)    10 cc solution made of 9 cc of 0.2% ropivacaine, 1 cc of Decadron 10 mg/cc.  Approximately 15 trigger points injected with 0.5 to 1 cc of above-mentioned solution.  Needling performed  End Time:   hrs.    Post-operative Assessment:  Post-procedure Vital Signs:  Pulse/HCG Rate: 77  Temp: (!) 97.3 F (36.3 C) Resp: 16 BP: 110/74 SpO2: 100 %  EBL: None  Complications: No immediate post-treatment complications observed by team, or reported by patient.  Note: The patient tolerated the entire procedure well. A repeat set of vitals were taken after the procedure and the patient was kept under observation following institutional policy, for this type of procedure. Post-procedural neurological assessment was performed, showing return to baseline, prior to discharge. The patient was provided with post-procedure discharge instructions, including a section on how to identify potential problems. Should any problems arise concerning this procedure, the patient was given instructions to immediately contact us, at any  time, without hesitation. In any case, we plan to contact the patient by telephone for a follow-up status report regarding this interventional procedure.  Comments:  No additional relevant information.   Plan of Care (POC)  Orders:  No orders of the defined types were placed in this encounter.   Medications ordered for procedure: Meds ordered this encounter  Medications   ropivacaine (PF) 2 mg/mL (0.2%) (NAROPIN) injection 9 mL   DISCONTD: methylPREDNISolone acetate (DEPO-MEDROL) injection 80 mg   dexamethasone (DECADRON) injection 10 mg   Medications administered: We administered ropivacaine (PF) 2 mg/mL (0.2%).  See the medical record for exact dosing, route, and time of administration.  Follow-up plan:   Return in about 4 weeks (around 10/19/2023) for f36f ppe.       Right SI-J and Right Piriformis 04/17/21, 09/02/21. 11/12/22, RIGHT LFCN 06/17/23       Recent Visits Date Type Provider Dept  08/27/23 Office Visit Edward Jolly, MD [facility]  Showing recent visits within past 90 days and meeting all other requirements Today's Visits Date Type Provider Dept  09/21/23 Procedure visit Edward Jolly, MD [facility]  Showing today's visits and meeting all other requirements Future Appointments Date Type Provider Dept  10/19/23 Appointment Edward Jolly, MD [facility]  Showing future appointments within next 90 days and meeting all other requirements  Disposition: Discharge home  Discharge (Date  Time): 09/21/2023; 1305 hrs.   Primary Care Physician: Bethanie Dicker, NP Location: [facility] Note by: Edward Jolly, MD (TTS technology used. I apologize for any typographical errors that were not detected and corrected.) Date: 09/21/2023; Time: 1:02 PM  Disclaimer:  Medicine is not an Visual merchandiser. The only guarantee in medicine is that nothing is guaranteed. It is important to note that the decision to proceed with  this intervention was based on the information collected from the patient. The Data and conclusions were drawn from the patient's questionnaire, the interview, and the physical examination. Because the information was provided in large part by  the patient, it cannot be guaranteed that it has not been purposely or unconsciously manipulated. Every effort has been made to obtain as much relevant data as possible for this evaluation. It is important to note that the conclusions that lead to this procedure are derived in large part from the available data. Always take into account that the treatment will also be dependent on availability of resources and existing treatment guidelines, considered by other Pain Management Practitioners as being common knowledge and practice, at the time of the intervention. For Medico-Legal purposes, it is also important to point out that variation in procedural techniques and pharmacological choices are the acceptable norm. The indications, contraindications, technique, and results of the above procedure should only be interpreted and judged by a Board-Certified Interventional Pain Specialist with extensive familiarity and expertise in the same exact procedure and technique.

## 2023-09-22 ENCOUNTER — Telehealth: Payer: Self-pay

## 2023-09-22 NOTE — Telephone Encounter (Signed)
Post procedure follow up. Pateint states she is doing good.

## 2023-09-24 ENCOUNTER — Ambulatory Visit: Payer: Medicare Other | Admitting: Nurse Practitioner

## 2023-10-06 ENCOUNTER — Ambulatory Visit: Payer: Medicare Other | Admitting: Nurse Practitioner

## 2023-10-12 ENCOUNTER — Ambulatory Visit: Payer: Medicare Other | Admitting: Cardiology

## 2023-10-16 ENCOUNTER — Telehealth: Payer: Self-pay | Admitting: Student in an Organized Health Care Education/Training Program

## 2023-10-19 ENCOUNTER — Ambulatory Visit: Payer: Medicare Other | Admitting: Student in an Organized Health Care Education/Training Program

## 2023-10-20 ENCOUNTER — Ambulatory Visit: Payer: Medicare Other | Admitting: Nurse Practitioner

## 2023-10-20 VITALS — BP 118/70 | HR 80 | Temp 98.4°F | Ht 65.0 in | Wt 169.4 lb

## 2023-10-20 DIAGNOSIS — E039 Hypothyroidism, unspecified: Secondary | ICD-10-CM

## 2023-10-20 DIAGNOSIS — I10 Essential (primary) hypertension: Secondary | ICD-10-CM | POA: Diagnosis not present

## 2023-10-20 DIAGNOSIS — E663 Overweight: Secondary | ICD-10-CM

## 2023-10-20 NOTE — Progress Notes (Unsigned)
Bethanie Dicker, NP-C Phone: [phone]  [name] is a 68 y.o. female who presents today for weight gain.   Discussed the use of AI scribe software for clinical note transcription with the patient, who gave verbal consent to proceed.  History of Present Illness   The patient presents with concerns about weight gain and thyroid issues.  She is concerned about weight gain and thyroid function. Despite no significant change in weight according to medical records, she feels uncomfortable in her clothes. She is scheduled for thyroid labs with endocrinology on Thursday and a follow-up appointment next week. She consistently takes her thyroid medication at 7 AM daily.  She describes fluctuating dietary habits, particularly snacking at night while watching TV, despite working out daily. She experiences difficulty with her clothes fitting comfortably and attributes this to her eating habits.  She has back issues that occasionally prevent her from walking, for which she receives injections. She also has a history of shoulder problems, which have worsened since surgery, affecting her ability to open things. She has ordered a device to assist with this issue.  She mentions occasional alcohol consumption, particularly margaritas, and tries to limit her intake to two drinks when going out.  She has a history of high blood pressure since COVID and is unsure about the timing of her medication, due to it causing increased urination and interrupting her day, which she refers to as 'the blue pill'. She last took it four days ago and is monitoring her blood pressure at home.  She expresses concern about her family history of cancer and dementia, noting that her sister, aged 62, is starting to show signs of dementia. She is interested in understanding the genetic aspects of dementia and mentions past experiences with cognitive testing.      Social History   Tobacco Use  Smoking Status Never  Smokeless  Tobacco Never    Current Outpatient Medications on File Prior to Visit  Medication Sig Dispense Refill   amLODipine (NORVASC) 5 MG tablet Take 1 tablet (5 mg total) by mouth daily. 90 tablet 3   Aspirin 81 MG CAPS Take by mouth daily at 8 pm.     atorvastatin (LIPITOR) 20 MG tablet Take 1 tablet (20 mg total) by mouth daily. 90 tablet 3   CALCIUM PO Take 1 tablet by mouth daily.     Cyanocobalamin (VITAMIN B 12 PO) Take 1 tablet by mouth daily at 6 (six) AM.     famotidine (PEPCID) 20 MG tablet Take 20 mg by mouth at bedtime.     levothyroxine (SYNTHROID) 200 MCG tablet Take 1 tablet by mouth once daily 90 tablet 0   Multiple Vitamin (MULTIVITAMIN PO) Take 1 tablet by mouth daily at 6 (six) AM.     omeprazole (PRILOSEC) 40 MG capsule Take 1 capsule (40 mg total) by mouth daily before breakfast. 90 capsule 3   Probiotic Product (CULTURELLE PROBIOTICS PO) Take 1 tablet by mouth daily at 6 (six) AM.     TURMERIC PO Take 2 capsules by mouth daily at 6 (six) AM.     VITAMIN D PO Take 1 tablet by mouth daily at 6 (six) AM.     ibuprofen (ADVIL) 800 MG tablet Take 800 mg by mouth every 8 (eight) hours as needed. (Patient not taking: Reported on 10/20/2023)     triamterene-hydrochlorothiazide (MAXZIDE-25) 37.5-25 MG tablet Take 0.5 tablets by mouth daily. (Patient not taking: Reported on 10/20/2023) 45 tablet 3   [DISCONTINUED] traZODone (  DESYREL) 50 MG tablet Take 0.5-1 tablets (25-50 mg total) by mouth at bedtime as needed for sleep. 90 tablet 3   No current facility-administered medications on file prior to visit.    ROS see history of present illness  Objective  Physical Exam Vitals:   10/20/23 1422  BP: 118/70  Pulse: 80  Temp: 98.4 F (36.9 C)  SpO2: 100%    BP Readings from Last 3 Encounters:  10/20/23 118/70  09/21/23 110/74  08/27/23 108/80   Wt Readings from Last 3 Encounters:  10/20/23 169 lb 6.4 oz (76.8 kg)  09/21/23 169 lb (76.7 kg)  08/27/23 167 lb (75.8 kg)     Physical Exam Constitutional:      General: She is not in acute distress.    Appearance: Normal appearance.  HENT:     Head: Normocephalic.  Cardiovascular:     Rate and Rhythm: Normal rate and regular rhythm.     Heart sounds: Normal heart sounds.  Pulmonary:     Effort: Pulmonary effort is normal.     Breath sounds: Normal breath sounds.  Skin:    General: Skin is warm and dry.  Neurological:     General: No focal deficit present.     Mental Status: She is alert.  Psychiatric:        Mood and Affect: Mood normal.        Behavior: Behavior normal.    Assessment/Plan: Please see individual problem list.  Hypothyroidism, unspecified type Assessment & Plan: Reports of weight gain persist despite stable weight in medical records. Potential thyroid issues were discussed. Adherence to thyroid medication is confirmed, with an upcoming endocrinology appointment for further evaluation. Continue current thyroid medication regimen and attend the scheduled endocrinology appointment.    Overweight (BMI 25.0-29.9) Assessment & Plan: Diet is inconsistent with late-night snacking. Regular exercise is maintained, though occasionally interrupted by back pain. Reduce late-night snacking and continue the regular exercise routine. Reduce alcohol intake, particularly sugary drinks, to assist with weight management.   Essential hypertension Assessment & Plan: Inconsistent use of Maxzide due to urinary frequency is reported, though blood pressure is well-controlled. Advise taking diuretic medication earlier in the day to reduce nocturnal urinary frequency. Continue Maxzide 37.5-25mg  half tablet daily and Norvasc 5mg  daily. Monitor blood pressure at home and report any significant changes.    Return in 7 weeks (on 12/08/2023) for Follow up as scheduled.   Bethanie Dicker, NP-C [facility]

## 2023-10-20 NOTE — Progress Notes (Deleted)
Cardiology Office Note:  .   Date:  10/20/2023  ID:  AVRI PAIVA, DOB 08/03/1956, MRN [id number] PCP: Bethanie Dicker, NP  [facility] Providers Cardiologist:  None { Click to update primary MD,subspecialty MD or APP then REFRESH:1}   History of Present Illness: .   MCKENA CHERN is a 68 y.o. female with past medical history of hypertension, hypothyroidism, arthritis, depression/anxiety, palpitations, follicular stroke, who is here today for follow-up.   During hospitalization in 05/2022 MRI of the brain showed remote clinic or infarct with minimal chronic microvascular changes.  She was asymptomatic at that time but continued to have weakness and decreased range of motion following an ACDF.  Prior echocardiogram completed 07/17/2022 revealed an EF of 60 to 65%, no RWMA, G1 DD, left atrial size with mildly dilated, and mild mitral valve regurgitation with negative bubble study.  Event monitor revealed rare PACs and PVCs and few episodes of brief PSVT.  There was no atrial fibrillation/flutter was observed.  She was continued on her current medication regimen.  She was evaluated in clinic 10/2019 for admission been experiencing chest pain that began 5 days prior after eating chicken wings.  The pain persisted for several hours despite Tums and aspirin prompting her to the emergency department via EMS for further evaluation.  She underwent laparoscopy 2 days later which was unremarkable.  Continued regular chest pain 7 out of 10 in intensity and pain of the right with stable up to a 9 out of 10.  She was administered 325 mg of aspirin and transferred to the emergency department for further evaluation.  Evaluation at ED was unrevealing she was discharged home and was scheduled for outpatient coronary CTA.  She presented to the [facility] emergency department with stable vitals, imaging was found to be reassuring, troponins were normal.  She denied any pain or discomfort.  She was able to be discharged home  advised to return to the ER for recurrent symptoms.   She was last seen in clinic 06/12/2023 at that time she was doing well from a cardiac perspective.  She denied any recurrent chest pain or shortness of breath.  She continued to have dysphagia and esophageal dilatations scheduled in the upcoming weeks.  She was encouraged to continue with her current medication regimen with no changes that were made.  Her coronary CTA revealed a coronary calcium score of 0.  EKG was unrevealing.  She returns to clinic today  ROS: 10 point review of system is reviewed and considered negative with exception what is been listed in HPI  Studies Reviewed: Marland Kitchen        Coronary CTA 12/04/22 IMPRESSION: 1. Normal coronary calcium score of 0. Patient is low risk.   2. Normal coronary origin with right dominance.   3. No evidence of CAD.   4. CAD-RADS 0. Consider non-atherosclerotic causes of chest pain.   EP Procedures and Devices: Event monitor (06/13/2022): Patient was monitored for 11 days, 23 hours.  Predominantly sinus rhythm with rare PACs and PVCs as well as a few brief episodes of PSVT lasting up to 8 beats.  No evidence of atrial fibrillation/flutter.   Non-Invasive Evaluation(s): TTE with bubble study (07/17/2022): Normal LV size with borderline LVH.  LVEF 60-65% with normal wall motion.  Grade 1 diastolic dysfunction.  Normal RV size and function.  Mild left atrial enlargement.  Mild mitral regurgitation.  Otherwise, no significant valvular abnormality.  CVP 3.  Bubble study negative for interatrial shunt. Risk Assessment/Calculations:  No BP recorded.  {Refresh Note OR Click here to enter BP  :1}***       Physical Exam:   VS:  There were no vitals taken for this visit.   Wt Readings from Last 3 Encounters:  09/21/23 169 lb (76.7 kg)  08/27/23 167 lb (75.8 kg)  07/27/23 169 lb 6 oz (76.8 kg)    GEN: Well nourished, well developed in no acute distress NECK: No JVD; No carotid bruits CARDIAC:  ***RRR, no murmurs, rubs, gallops RESPIRATORY:  Clear to auscultation without rales, wheezing or rhonchi  ABDOMEN: Soft, non-tender, non-distended EXTREMITIES:  No edema; No deformity   ASSESSMENT AND PLAN: .   ***    {Are you ordering a CV Procedure (e.g. stress test, cath, DCCV, TEE, etc)?   Press F2        :[id number]}  Dispo: ***  Signed, Dimarco Minkin, NP

## 2023-10-21 ENCOUNTER — Ambulatory Visit: Payer: Medicare Other | Admitting: Cardiology

## 2023-10-22 ENCOUNTER — Encounter: Payer: Self-pay | Admitting: Nurse Practitioner

## 2023-10-22 DIAGNOSIS — M810 Age-related osteoporosis without current pathological fracture: Secondary | ICD-10-CM | POA: Diagnosis not present

## 2023-10-22 DIAGNOSIS — E663 Overweight: Secondary | ICD-10-CM | POA: Insufficient documentation

## 2023-10-22 NOTE — Assessment & Plan Note (Signed)
Inconsistent use of Maxzide due to urinary frequency is reported, though blood pressure is well-controlled. Advise taking diuretic medication earlier in the day to reduce nocturnal urinary frequency. Continue Maxzide 37.5-25mg  half tablet daily and Norvasc 5mg  daily. Monitor blood pressure at home and report any significant changes.

## 2023-10-22 NOTE — Assessment & Plan Note (Signed)
Reports of weight gain persist despite stable weight in medical records. Potential thyroid issues were discussed. Adherence to thyroid medication is confirmed, with an upcoming endocrinology appointment for further evaluation. Continue current thyroid medication regimen and attend the scheduled endocrinology appointment.

## 2023-10-22 NOTE — Assessment & Plan Note (Signed)
Diet is inconsistent with late-night snacking. Regular exercise is maintained, though occasionally interrupted by back pain. Reduce late-night snacking and continue the regular exercise routine. Reduce alcohol intake, particularly sugary drinks, to assist with weight management.

## 2023-10-29 DIAGNOSIS — Z833 Family history of diabetes mellitus: Secondary | ICD-10-CM | POA: Diagnosis not present

## 2023-10-29 DIAGNOSIS — M810 Age-related osteoporosis without current pathological fracture: Secondary | ICD-10-CM | POA: Diagnosis not present

## 2023-10-29 DIAGNOSIS — E063 Autoimmune thyroiditis: Secondary | ICD-10-CM | POA: Diagnosis not present

## 2023-11-03 ENCOUNTER — Ambulatory Visit: Payer: Medicare Other | Admitting: Student in an Organized Health Care Education/Training Program

## 2023-11-04 ENCOUNTER — Ambulatory Visit: Payer: Medicare Other | Admitting: Nurse Practitioner

## 2023-11-04 ENCOUNTER — Encounter: Payer: Self-pay | Admitting: Nurse Practitioner

## 2023-11-04 VITALS — BP 108/76 | HR 61 | Temp 98.3°F | Ht 65.0 in | Wt 173.2 lb

## 2023-11-04 DIAGNOSIS — J029 Acute pharyngitis, unspecified: Secondary | ICD-10-CM | POA: Diagnosis not present

## 2023-11-04 LAB — POCT INFLUENZA A/B
Influenza A, POC: NEGATIVE
Influenza B, POC: NEGATIVE

## 2023-11-04 LAB — POCT RAPID STREP A (OFFICE): Rapid Strep A Screen: NEGATIVE

## 2023-11-04 NOTE — Addendum Note (Signed)
Addended by: Donavan Foil on: 11/04/2023 03:31 PM   Modules accepted: Orders

## 2023-11-04 NOTE — Assessment & Plan Note (Addendum)
Experiencing a sore throat, pain with swallowing and a mild cough. There is no fever or exposure to infectious diseases. Tests for flu and strep are negative. The throat is not red, and lymph nodes are not enlarged, suggesting possible viral illness or PND as the cause. Advise symptomatic treatment with warm soothing fluids, chloraseptic spray, and cough drops. Return precautions given to patient, advised to return to care if symptoms are worsening/changing.

## 2023-11-04 NOTE — Progress Notes (Signed)
Bethanie Dicker, NP-C Phone: [phone]  [name] is a 68 y.o. female who presents today for sore throat.   Discussed the use of AI scribe software for clinical note transcription with the patient, who gave verbal consent to proceed.  History of Present Illness   [name] is a 68 year old female who presents with a sore throat and pain with swallowing.  She has been experiencing a sore throat since last night, describing it as very tender and causing difficulty with swallowing. The pain was significant enough to disrupt her sleep, requiring her to sleep in a sitting position. She also feels as though her saliva is building up due to the pain.  She notes tenderness on the right side of her neck and has experienced chills today. Both flu and strep tests are negative. The symptoms started less than 24 hours ago.  She experienced a little coughing yesterday, which was painful due to a burning sensation in her throat. She has been using ginger, gargling with water and Listerine, and feels cold and tired today. No fever, headaches, nausea, vomiting, or known exposure to illnesses like COVID-19 or the flu.  She has a history of thyroid issues and is concerned about whether her current symptoms could be related to her thyroid, although she does not report any recent changes in her thyroid condition.  Social History   Tobacco Use  Smoking Status Never  Smokeless Tobacco Never    Current Outpatient Medications on File Prior to Visit  Medication Sig Dispense Refill   amLODipine (NORVASC) 5 MG tablet Take 1 tablet (5 mg total) by mouth daily. 90 tablet 3   Aspirin 81 MG CAPS Take by mouth daily at 8 pm.     atorvastatin (LIPITOR) 20 MG tablet Take 1 tablet (20 mg total) by mouth daily. 90 tablet 3   CALCIUM PO Take 1 tablet by mouth daily.     Cyanocobalamin (VITAMIN B 12 PO) Take 1 tablet by mouth daily at 6 (six) AM.     famotidine (PEPCID) 20 MG tablet Take 20 mg by mouth at bedtime.      ibuprofen (ADVIL) 800 MG tablet Take 800 mg by mouth every 8 (eight) hours as needed. (Patient not taking: Reported on 10/20/2023)     levothyroxine (SYNTHROID) 200 MCG tablet Take 1 tablet by mouth once daily 90 tablet 0   Multiple Vitamin (MULTIVITAMIN PO) Take 1 tablet by mouth daily at 6 (six) AM.     omeprazole (PRILOSEC) 40 MG capsule Take 1 capsule (40 mg total) by mouth daily before breakfast. 90 capsule 3   Probiotic Product (CULTURELLE PROBIOTICS PO) Take 1 tablet by mouth daily at 6 (six) AM.     triamterene-hydrochlorothiazide (MAXZIDE-25) 37.5-25 MG tablet Take 0.5 tablets by mouth daily. (Patient not taking: Reported on 10/20/2023) 45 tablet 3   TURMERIC PO Take 2 capsules by mouth daily at 6 (six) AM.     VITAMIN D PO Take 1 tablet by mouth daily at 6 (six) AM.     [DISCONTINUED] traZODone (DESYREL) 50 MG tablet Take 0.5-1 tablets (25-50 mg total) by mouth at bedtime as needed for sleep. 90 tablet 3   No current facility-administered medications on file prior to visit.    ROS see history of present illness  Objective  Physical Exam Vitals:   11/04/23 1513  BP: 108/76  Pulse: 61  Temp: 98.3 F (36.8 C)  SpO2: 99%    BP Readings from Last 3  Encounters:  11/04/23 108/76  10/20/23 118/70  09/21/23 110/74   Wt Readings from Last 3 Encounters:  11/04/23 173 lb 3.2 oz (78.6 kg)  10/20/23 169 lb 6.4 oz (76.8 kg)  09/21/23 169 lb (76.7 kg)    Physical Exam Constitutional:      General: She is not in acute distress.    Appearance: Normal appearance.  HENT:     Head: Normocephalic.     Right Ear: Tympanic membrane normal.     Left Ear: Tympanic membrane normal.     Nose: Nose normal.     Mouth/Throat:     Mouth: Mucous membranes are moist.     Pharynx: Oropharynx is clear. No oropharyngeal exudate or posterior oropharyngeal erythema.  Eyes:     Conjunctiva/sclera: Conjunctivae normal.     Pupils: Pupils are equal, round, and reactive to light.   Cardiovascular:     Rate and Rhythm: Normal rate and regular rhythm.     Heart sounds: Normal heart sounds.  Pulmonary:     Effort: Pulmonary effort is normal.     Breath sounds: Normal breath sounds.  Lymphadenopathy:     Cervical: No cervical adenopathy.  Skin:    General: Skin is warm and dry.  Neurological:     General: No focal deficit present.     Mental Status: She is alert.  Psychiatric:        Mood and Affect: Mood normal.        Behavior: Behavior normal.    Assessment/Plan: Please see individual problem list.  Pharyngitis, unspecified etiology Assessment & Plan: Experiencing a sore throat, pain with swallowing and a mild cough. There is no fever or exposure to infectious diseases. Tests for flu and strep are negative. The throat is not red, and lymph nodes are not enlarged, suggesting possible viral illness or PND as the cause. Advise symptomatic treatment with warm soothing fluids, chloraseptic spray, and cough drops. Return precautions given to patient, advised to return to care if symptoms are worsening/changing.     Return if symptoms worsen or fail to improve.   Bethanie Dicker, NP-C [facility]

## 2023-11-10 DIAGNOSIS — M67811 Other specified disorders of synovium, right shoulder: Secondary | ICD-10-CM | POA: Diagnosis not present

## 2023-11-11 NOTE — Progress Notes (Deleted)
 Cardiology Office Note:  .   Date:  11/11/2023  ID:  JENEANE [name], DOB [birth year], MRN [id number] PCP: Jasmine Dicker, NP  [facility] Providers Cardiologist:  None { Click to update primary MD,subspecialty MD or APP then REFRESH:1}   History of Present Illness: .   [name] is a 68 y.o. female with a past medical history of hypertension, hypothyroidism, arthritis, depression/anxiety, palpitations, lacunar stroke, who is here today for follow-up.   September 2023 after MRI of the brain showed remote clinic or infarct with minimal chronic microvascular changes. She was asymptomatic at that time but continued to have weakness and decreased range of motion following an ACDF. Prior echocardiogram completed 07/17/2022 revealed an EF of 60 to 65%, no RWMA, G1 DD, left atrial size with mildly dilated, and mild mitral valve regurgitation with negative bubble study. Event monitor revealed rare PACs and PVCs and few episodes of brief PSVT. There was no atrial fibrillation/flutter was observed. She was continued on her current medication regimen.    She was last seen in clinic 06/12/2023 and was doing well from a cardiac perspective.  She had denied any recurrent chest pain or shortness of breath.  She continued to have dysphagia and had to have several esophageal dilatation procedures scheduled.  She had recently had her thyroid medication increased and was scheduled for thyroid ultrasound.  There were no changes that were made to her medication regimen and no further testing that was ordered.  She returns to clinic today  ROS: 10 point review of systems has been reviewed and considered negative except for what is been listed in the HPI  Studies Reviewed: Marland Kitchen        Coronary CTA 12/04/22 IMPRESSION: 1. Normal coronary calcium score of 0. Patient is low risk.   2. Normal coronary origin with right dominance.   3. No evidence of CAD.   4. CAD-RADS 0. Consider non-atherosclerotic causes of  chest pain.   EP Procedures and Devices: Event monitor (06/13/2022): Patient was monitored for 11 days, 23 hours.  Predominantly sinus rhythm with rare PACs and PVCs as well as a few brief episodes of PSVT lasting up to 8 beats.  No evidence of atrial fibrillation/flutter.   Non-Invasive Evaluation(s): TTE with bubble study (07/17/2022): Normal LV size with borderline LVH.  LVEF 60-65% with normal wall motion.  Grade 1 diastolic dysfunction.  Normal RV size and function.  Mild left atrial enlargement.  Mild mitral regurgitation.  Otherwise, no significant valvular abnormality.  CVP 3.  Bubble study negative for interatrial shunt. Risk Assessment/Calculations:     No BP recorded.  {Refresh Note OR Click here to enter BP  :1}***       Physical Exam:   VS:  There were no vitals taken for this visit.   Wt Readings from Last 3 Encounters:  11/04/23 173 lb 3.2 oz (78.6 kg)  10/20/23 169 lb 6.4 oz (76.8 kg)  09/21/23 169 lb (76.7 kg)    GEN: Well nourished, well developed in no acute distress NECK: No JVD; No carotid bruits CARDIAC: ***RRR, II/VI murmurs, rubs, gallops RESPIRATORY:  Clear to auscultation without rales, wheezing or rhonchi  ABDOMEN: Soft, non-tender, non-distended EXTREMITIES:  No edema; No deformity   ASSESSMENT AND PLAN: .   Primary hypertension Lacunar stroke Palpitations Hypothyroidism    {Are you ordering a CV Procedure (e.g. stress test, cath, DCCV, TEE, etc)?   Press F2        :[id number]}  Dispo: ***  Signed, Evangelynn Lochridge, NP

## 2023-11-12 ENCOUNTER — Ambulatory Visit: Payer: Medicare Other | Admitting: Cardiology

## 2023-11-19 ENCOUNTER — Ambulatory Visit: Payer: Medicare Other | Admitting: Orthopaedic Surgery

## 2023-11-19 DIAGNOSIS — M67813 Other specified disorders of tendon, right shoulder: Secondary | ICD-10-CM | POA: Diagnosis not present

## 2023-11-19 DIAGNOSIS — M25511 Pain in right shoulder: Secondary | ICD-10-CM | POA: Diagnosis not present

## 2023-11-20 ENCOUNTER — Other Ambulatory Visit: Payer: Self-pay | Admitting: Nurse Practitioner

## 2023-11-24 ENCOUNTER — Encounter: Payer: Self-pay | Admitting: Student in an Organized Health Care Education/Training Program

## 2023-11-24 ENCOUNTER — Ambulatory Visit
Payer: Medicare Other | Attending: Student in an Organized Health Care Education/Training Program | Admitting: Student in an Organized Health Care Education/Training Program

## 2023-11-24 VITALS — BP 128/76 | HR 70 | Temp 97.3°F | Ht 64.0 in | Wt 170.0 lb

## 2023-11-24 DIAGNOSIS — G8929 Other chronic pain: Secondary | ICD-10-CM | POA: Diagnosis not present

## 2023-11-24 DIAGNOSIS — G894 Chronic pain syndrome: Secondary | ICD-10-CM | POA: Diagnosis not present

## 2023-11-24 DIAGNOSIS — S46001S Unspecified injury of muscle(s) and tendon(s) of the rotator cuff of right shoulder, sequela: Secondary | ICD-10-CM | POA: Diagnosis not present

## 2023-11-24 DIAGNOSIS — M7581 Other shoulder lesions, right shoulder: Secondary | ICD-10-CM | POA: Diagnosis not present

## 2023-11-24 DIAGNOSIS — M19111 Post-traumatic osteoarthritis, right shoulder: Secondary | ICD-10-CM | POA: Diagnosis not present

## 2023-11-24 DIAGNOSIS — M25511 Pain in right shoulder: Secondary | ICD-10-CM | POA: Insufficient documentation

## 2023-11-24 NOTE — Progress Notes (Signed)
 PROVIDER NOTE: Information contained herein reflects review and annotations entered in association with encounter. Interpretation of such information and data should be left to medically-trained personnel. Information provided to patient can be located elsewhere in the medical record under "Patient Instructions". Document created using STT-dictation technology, any transcriptional errors that may result from process are unintentional.    Patient: [name]  Service Category: E/M  Provider: Edward Jolly, MD  DOB: [birth year]  DOS: 11/24/2023  Referring Provider: Bethanie Dicker, NP  MRN: [id number]  Specialty: Interventional Pain Management  PCP: Bethanie Dicker, NP  Type: Established Patient  Setting: Ambulatory outpatient    Location: Office  Delivery: Face-to-face     HPI  [name], a 68 y.o. 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 Ms. Gilchrest primary complain today is Shoulder Pain (right)  Pertinent problems: Ms. Marchese does not have any pertinent problems on file. Pain Assessment: Severity of Chronic pain is reported as a 8 /10. Location: Shoulder Right/pain radiaties down to her upper arm. Onset: More than a month ago. Quality: Stabbing, Throbbing, Shooting, Burning, Aching, Constant. Timing: Constant. Modifying factor(s): nothing. Vitals:  height is 5\' 4"  (1.626 m) and weight is 170 lb (77.1 kg). Her temperature is 97.3 F (36.3 C) (abnormal). Her blood pressure is 128/76 and her pulse is 70. Her oxygen saturation is 100%.  BMI: Estimated body mass index is 29.18 kg/m as calculated from the following:   Height as of this encounter: 5\' 4"  (1.626 m).   Weight as of this encounter: 170 lb (77.1 kg). Last encounter: 08/27/2023. Last procedure: 09/21/2023.  Reason for encounter: post-procedure evaluation and assessment. Also increased right shoulder pain  PPE after lumbar TPIs resulted in 80% pain  relief, patient states that low back pain is much better. Having increased right shoulder pain   History of Present Illness   [name] is a 68 year old female with RIGHT rotator cuff tendinosis and tears who presents with  RIGHT shoulder pain.  She has been experiencing RIGHT shoulder pain that initially began last year. After consulting an orthopedist and undergoing x-rays, the pain subsided but has recently recurred and persisted. An MRI of the right shoulder confirmed rotator cuff tendinosis and small tears. She has not yet received any injections for this issue but has attended physical therapy which was not helpful.  The shoulder pain is exacerbated by certain movements, such as reaching overhead or behind her back, and activities like washing her hair. She notes difficulty with these actions due to pain. Despite the pain, she maintains a good range of motion except when attempting to reach higher.  She is right-handed and has previously experienced loss of mobility following surgery on the right side. She is mindful of her activities to prevent worsening of the tears.  She continues to work out to maintain mobility, as inactivity leads to stiffness. She is cautious about the weight and range of motion during exercises to avoid aggravating her shoulder condition.         ROS  Constitutional: Denies any fever or chills Gastrointestinal: No reported hemesis, hematochezia, vomiting, or acute GI distress Musculoskeletal:  right shoulder pain Neurological: No reported episodes of acute onset apraxia, aphasia, dysarthria, agnosia, amnesia, paralysis, loss of coordination, or loss of consciousness  Medication Review  Aspirin, Calcium, Cyanocobalamin, Multiple Vitamin, Probiotic Product, Turmeric, Vitamin D, amLODipine, atorvastatin, famotidine, ibuprofen, levothyroxine, omeprazole, traZODone, and triamterene-hydrochlorothiazide  History Review  Allergy: [name] has no known  allergies. Drug: [name]  reports no history of drug use. Alcohol:  reports current alcohol use of about 3.0 standard drinks of alcohol per week. Tobacco:  reports that she has never smoked. She has never used smokeless tobacco. Social: Ms. Rhatigan  reports that she has never smoked. She has never used smokeless tobacco. She reports current alcohol use of about 3.0 standard drinks of alcohol per week. She reports that she does not use drugs. Medical:  has a past medical history of Anemia, Anxiety, Arthritis, Bronchitis, COVID-19 (07/08/2019), Depression, GERD (gastroesophageal reflux disease), Hypertension, Hypothyroidism, Mini stroke, Osteoporosis, PTSD (post-traumatic stress disorder), Stroke (HCC) (2023), and Wears glasses. Surgical: [name]  has a past surgical history that includes Back surgery; Breast surgery; Augmentation mammaplasty (Bilateral, 2006); Esophagogastroduodenoscopy (egd) with propofol (N/A, 10/24/2020); Esophagogastroduodenoscopy (egd) with propofol (N/A, 12/20/2020); Esophageal manometry (N/A, 03/04/2021); 24 hour ph study (N/A, 03/04/2021); PH impedance study (N/A, 03/04/2021); Vein Surgery (1991); Upper gastrointestinal endoscopy; Colonoscopy; Anterior cervical decomp/discectomy fusion (N/A, 05/12/2022); and Esophagogastroduodenoscopy (egd) with propofol (N/A, 11/11/2022). Family: family history includes Cancer in her father and sister; Colon cancer in her sister; Dementia in her mother; Diabetes in her father; Diabetes Mellitus I in her son; Heart Problems in her father; Heart disease in her father, mother, sister, and sister; Hypertension in her mother; Lung cancer in her maternal uncle; Osteoporosis in her mother; Ovarian cancer in her sister; Sickle cell trait in her daughter; Skin cancer in an other family member; Vitiligo in her mother and son.  Laboratory Chemistry Profile   Renal Lab Results  Component Value Date   BUN 18 06/09/2023   CREATININE 0.81 06/09/2023    GFR 76.24 06/09/2023   GFRAA >60 10/04/2019   GFRNONAA >60 11/14/2022    Hepatic Lab Results  Component Value Date   AST 23 06/09/2023   ALT 18 06/09/2023   ALBUMIN 4.4 06/09/2023   ALKPHOS 55 06/09/2023   LIPASE 56 (H) 11/14/2022    Electrolytes Lab Results  Component Value Date   NA 137 06/09/2023   K 4.2 06/09/2023   CL 103 06/09/2023   CALCIUM 9.5 06/09/2023    Bone Lab Results  Component Value Date   VD25OH 73.7 08/11/2023    Inflammation (CRP: Acute Phase) (ESR: Chronic Phase) No results found for: "CRP", "ESRSEDRATE", "LATICACIDVEN"       Note: Above Lab results reviewed.  Recent Imaging Review  DG Lumbar Spine Complete W/Bend CLINICAL DATA:  Chronic low back pain.  No specific injury.  EXAM: LUMBAR SPINE - COMPLETE WITH BENDING VIEWS  COMPARISON:  None Available.  FINDINGS: No fracture or bone lesion.  Minimal anterolisthesis of L3 on L4, L4-L5 and L5 on S1.  Disc spaces are well preserved. Right-sided facet degenerative change at L4-L5 and L5-S1. Left facet degenerative change at L5-S1.  Soft tissues are unremarkable.  IMPRESSION: 1. No fracture or acute finding. 2. Mild degenerative changes as detailed.  Electronically Signed   By: Amie Portland M.D.   On: 09/07/2023 08:38  CLINICAL DATA:  Right shoulder pain radiating from the neck down into the shoulders.   EXAM: MRI OF THE RIGHT SHOULDER WITHOUT CONTRAST   TECHNIQUE: Multiplanar, multisequence MR imaging of the shoulder was performed. No intravenous contrast was administered.   COMPARISON:  None Available.   FINDINGS: Rotator cuff: Severe tendinosis of the supraspinatus tendon with a small insertional interstitial tear and fraying along the anterior bursal surface. Mild tendinosis of  the infraspinatus tendon. Teres minor tendon is intact. Subscapularis tendon is intact.   Muscles: No muscle atrophy or edema. No intramuscular fluid collection or hematoma.   Biceps Long Head:  Moderate tendinosis of the intra-articular portion of the long head of the biceps tendon.   Acromioclavicular Joint: Moderate arthropathy of the acromioclavicular joint. Trace subacromial/subdeltoid bursal fluid.   Glenohumeral Joint: No joint effusion. No chondral defect.   Labrum: Posterosuperior labral degeneration.   Bones: No fracture or dislocation. No aggressive osseous lesion.   Other: No fluid collection or hematoma.   IMPRESSION: 1. Severe tendinosis of the supraspinatus tendon with a small insertional interstitial tear and fraying along the anterior bursal surface. 2. Mild tendinosis of the infraspinatus tendon. 3. Moderate tendinosis of the intra-articular portion of the long head of the biceps tendon.      Note: Reviewed        Physical Exam  General appearance: Well nourished, well developed, and well hydrated. In no apparent acute distress Mental status: Alert, oriented x 3 (person, place, & time)       Respiratory: No evidence of acute respiratory distress Eyes: PERLA Vitals: BP 128/76   Pulse 70   Temp (!) 97.3 F (36.3 C)   Ht 5\' 4"  (1.626 m)   Wt 170 lb (77.1 kg)   SpO2 100%   BMI 29.18 kg/m  BMI: Estimated body mass index is 29.18 kg/m as calculated from the following:   Height as of this encounter: 5\' 4"  (1.626 m).   Weight as of this encounter: 170 lb (77.1 kg). Ideal: Ideal body weight: 54.7 kg (120 lb 9.5 oz) Adjusted ideal body weight: 63.7 kg (140 lb 5.7 oz)  Right shoulder pain, limited ROM specifically behind the back reach and overhead reach  Assessment   Diagnosis Status  1. 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 Chronic right shoulder pain   3. Right rotator cuff tendonitis   4. Chronic pain syndrome    Having a Flare-up Having a Flare-up Controlled   Updated Problems: Problem  Osteoarthritis of Right Shoulder Due to Rotator Cuff Injury  Right Rotator Cuff Tendonitis    Plan of Care  Assessment  and Plan    Rotator Cuff Tendinosis with Small Tears   Chronic right shoulder pain is due to severe tendinosis and small tears in the rotator cuff, worsened by overhead activities. Conservative management with physical therapy has been attempted without injections. She has a good range of motion but experiences significant pain. There is a risk of worsening tears with inappropriate activities, so avoiding exacerbation is crucial. A steroid injection is a low-risk option that may provide relief. If ineffective, a nerve block can be considered before surgical options. Administer a steroid injection to the right shoulder. Consider a nerve block if the steroid injection is ineffective. Advise avoiding activities that exacerbate pain and encourage continuation of physical therapy with mindful weight and range of motion.  Chronic Lower Back Pain   Chronic lower back pain was previously managed effectively with trigger point injections. She is currently asymptomatic but aware of the option for repeat injections if pain recurs. Trigger point injections are low-risk and can be administered more frequently if needed. Offer trigger point injections as needed for recurrent lower back pain.  General Health Maintenance   She is active and engages in regular workouts to maintain mobility and prevent stiffness. It is important to continue physical activity while being mindful of pain and limitations.  Encourage continuation of regular physical activity with attention to pain and limitations.  Follow-up   Schedule a follow-up appointment in two weeks to assess the response to the shoulder injection.         Ms. NYELLI SAMARA has a current medication list which includes the following long-term medication(s): amlodipine, atorvastatin, calcium, famotidine, levothyroxine, omeprazole, triamterene-hydrochlorothiazide, and [DISCONTINUED] trazodone.  Pharmacotherapy (Medications Ordered): No orders of the defined types  were placed in this encounter.  Orders:  Orders Placed This Encounter  Procedures   SHOULDER INJECTION    Standing Status:   Future    Expected Date:   12/21/2023    Expiration Date:   02/24/2024    Scheduling Instructions:     Procedure: Intra-articular shoulder (Glenohumeral) joint injection     Side: RIGHT     Level: Glenohumeral joint               Sedation: Patient's choice.     Timeframe: As permitted by the schedule    Where will this procedure be performed?:   [facility]   Follow-up plan:   Return in about 27 days (around 12/21/2023) for Right shoulder injection, in clinic [facility].      Right SI-J and Right Piriformis 04/17/21, 09/02/21. 11/12/22, RIGHT LFCN 06/17/23        Recent Visits Date Type Provider Dept  09/21/23 Procedure visit Edward Jolly, MD [facility]  08/27/23 Office Visit Edward Jolly, MD [facility]  Showing recent visits within past 90 days and meeting all other requirements Today's Visits Date Type Provider Dept  11/24/23 Office Visit Edward Jolly, MD [facility]  Showing today's visits and meeting all other requirements Future Appointments Date Type Provider Dept  12/21/23 Appointment Edward Jolly, MD [facility]  Showing future appointments within next 90 days and meeting all other requirements  I discussed the assessment and treatment plan with the patient. The patient was provided an opportunity to ask questions and all were answered. The patient agreed with the plan and demonstrated an understanding of the instructions.  Patient advised to call back or seek an in-person evaluation if the symptoms or condition worsens.  Duration of encounter: .  Total time on encounter, as per AMA guidelines included both the face-to-face and non-face-to-face time personally spent by the physician and/or other qualified health care professional(s) on the day of the encounter (includes time in activities  that require the physician or other qualified health care professional and does not include time in activities normally performed by clinical staff). Physician's time may include the following activities when performed: Preparing to see the patient (e.g., pre-charting review of records, searching for previously ordered imaging, lab work, and nerve conduction tests) Review of prior analgesic pharmacotherapies. Reviewing PMP Interpreting ordered tests (e.g., lab work, imaging, nerve conduction tests) Performing post-procedure evaluations, including interpretation of diagnostic procedures Obtaining and/or reviewing separately obtained history Performing a medically appropriate examination and/or evaluation Counseling and educating the patient/family/caregiver Ordering medications, tests, or procedures Referring and communicating with other health care professionals (when not separately reported) Documenting clinical information in the electronic or other health record Independently interpreting results (not separately reported) and communicating results to the patient/ family/caregiver Care coordination (not separately reported)  Note by: Edward Jolly, MD Date: 11/24/2023; Time: 10:04 AM

## 2023-11-24 NOTE — Progress Notes (Signed)
 Safety precautions to be maintained throughout the outpatient stay will include: orient to surroundings, keep bed in low position, maintain call bell within reach at all times, provide assistance with transfer out of bed and ambulation.

## 2023-11-25 DIAGNOSIS — M810 Age-related osteoporosis without current pathological fracture: Secondary | ICD-10-CM | POA: Diagnosis not present

## 2023-11-29 ENCOUNTER — Other Ambulatory Visit: Payer: Self-pay | Admitting: Nurse Practitioner

## 2023-11-29 DIAGNOSIS — E039 Hypothyroidism, unspecified: Secondary | ICD-10-CM

## 2023-11-30 ENCOUNTER — Other Ambulatory Visit: Payer: Self-pay | Admitting: Nurse Practitioner

## 2023-11-30 DIAGNOSIS — E039 Hypothyroidism, unspecified: Secondary | ICD-10-CM

## 2023-11-30 MED ORDER — LEVOTHYROXINE SODIUM 200 MCG PO TABS
200.0000 ug | ORAL_TABLET | Freq: Every day | ORAL | 0 refills | Status: DC
Start: 2023-11-30 — End: 2024-02-24

## 2023-12-08 ENCOUNTER — Encounter: Payer: Self-pay | Admitting: Nurse Practitioner

## 2023-12-08 ENCOUNTER — Ambulatory Visit (INDEPENDENT_AMBULATORY_CARE_PROVIDER_SITE_OTHER): Payer: Medicare Other | Admitting: Nurse Practitioner

## 2023-12-08 VITALS — BP 100/60 | HR 68 | Temp 98.3°F | Ht 64.0 in | Wt 170.6 lb

## 2023-12-08 DIAGNOSIS — K219 Gastro-esophageal reflux disease without esophagitis: Secondary | ICD-10-CM

## 2023-12-08 DIAGNOSIS — E663 Overweight: Secondary | ICD-10-CM | POA: Diagnosis not present

## 2023-12-08 DIAGNOSIS — M810 Age-related osteoporosis without current pathological fracture: Secondary | ICD-10-CM | POA: Diagnosis not present

## 2023-12-08 DIAGNOSIS — I10 Essential (primary) hypertension: Secondary | ICD-10-CM | POA: Diagnosis not present

## 2023-12-08 DIAGNOSIS — M7581 Other shoulder lesions, right shoulder: Secondary | ICD-10-CM

## 2023-12-08 DIAGNOSIS — R7303 Prediabetes: Secondary | ICD-10-CM

## 2023-12-08 DIAGNOSIS — E039 Hypothyroidism, unspecified: Secondary | ICD-10-CM | POA: Diagnosis not present

## 2023-12-08 LAB — COMPREHENSIVE METABOLIC PANEL
ALT: 21 U/L (ref 0–35)
AST: 26 U/L (ref 0–37)
Albumin: 4.6 g/dL (ref 3.5–5.2)
Alkaline Phosphatase: 65 U/L (ref 39–117)
BUN: 18 mg/dL (ref 6–23)
CO2: 27 meq/L (ref 19–32)
Calcium: 10 mg/dL (ref 8.4–10.5)
Chloride: 105 meq/L (ref 96–112)
Creatinine, Ser: 0.72 mg/dL (ref 0.40–1.20)
GFR: 87.5 mL/min (ref >60.00)
Glucose, Bld: 91 mg/dL (ref 70–99)
Potassium: 4.6 meq/L (ref 3.5–5.1)
Sodium: 139 meq/L (ref 135–145)
Total Bilirubin: 0.5 mg/dL (ref 0.2–1.2)
Total Protein: 7.7 g/dL (ref 6.0–8.3)

## 2023-12-08 LAB — T4, FREE: Free T4: 1.2 ng/dL (ref 0.60–1.60)

## 2023-12-08 LAB — TSH: TSH: 0.62 u[IU]/mL (ref 0.35–5.50)

## 2023-12-08 MED ORDER — WEGOVY 0.5 MG/0.5ML ~~LOC~~ SOAJ: 0.5000 mg | SUBCUTANEOUS | 0 refills | Status: DC

## 2023-12-08 MED ORDER — WEGOVY 0.25 MG/0.5ML ~~LOC~~ SOAJ: 0.2500 mg | SUBCUTANEOUS | 0 refills | Status: DC

## 2023-12-08 NOTE — Assessment & Plan Note (Signed)
 Reflux is somewhat controlled with Pepcid and dietary modifications. Omeprazole was discontinued due to side effects. Continue Pepcid daily and Omeprazole as needed. Advised to avoid dietary triggers.

## 2023-12-08 NOTE — Assessment & Plan Note (Signed)
 Her blood pressure is well-controlled with Norvasc and triamterene/hydrochlorothiazide, though medication adherence is inconsistent. She is advised to take medication as prescribed to avoid hypotension. Continue triamterene/hydrochlorothiazide 37.5-25 mg half a tablet daily and Norvasc 5 mg daily.

## 2023-12-08 NOTE — Assessment & Plan Note (Signed)
 She is concerned about weight gain and interested in semaglutide for weight loss, though insurance coverage is uncertain. BMI of 29 today. Will start the patient on Wegovy. Counseled on the risk of pancreatitis and gallbladder disease. Discussed the risk of nausea. Advised to discontinue the [facility] and contact us immediately if they develop abdominal pain. If they develop excessive nausea they will contact us right away. I discussed that medullary thyroid cancer has been seen in rats studies. The patient confirmed no personal or family history of thyroid cancer, parathyroid cancer, or adrenal gland cancer. Discussed that we thus far have not seen medullary thyroid cancer result from use of this type of medication in humans. Advised to monitor the thyroid area and contact us for any lumps, swelling, trouble swallowing, or any other changes in this area.  Discussed goal weight loss of 1 to 2 pounds a week while on this medication. Discussed the importance of healthy diet, exercise and lifestyle modifications even with this medication. She will follow up in 6 weeks to assess medication tolerance and effectiveness.

## 2023-12-08 NOTE — Progress Notes (Signed)
 Jasmine Dicker, NP-C Phone: [phone]  [name] is a 68 y.o. female who presents today for follow up.   Discussed the use of AI scribe software for clinical note transcription with the patient, who gave verbal consent to proceed.  History of Present Illness   [name] is a 68 year old female who presents for follow-up on thyroid and weight management.  She has ongoing issues with her right shoulder, diagnosed with rotator cuff tears and tendonitis. She attended physical therapy once but found it unhelpful and costly. The shoulder pain makes it difficult for her to work out. She is followed by ortho for this and is scheduled for a joint injection at the end of the month.   She is on Levothyroxine 200 mcg daily for thyroid management. Her thyroid levels were checked in November, showing an elevated TSH, but she is unsure if it was rechecked during her February endocrinology follow-up. She occasionally misses doses of her thyroid medication due to running out and not realizing she had more. No heart palpitations or issues with temperature regulation.  She has a history of osteoporosis and recently received an infusion for it. She is under the care of an endocrinologist for this condition.  She is on Norvasc 5 mg for hypertension and occasionally skips her hydrochlorothiazide due to timing issues. She notes variability in the size of her blood pressure pills, which she attributes to different manufacturers. Her blood pressure is generally well-controlled, though she sometimes takes a whole pill instead of a half, which could lower her blood pressure too much. No dizziness or lightheadedness.  She experiences acid reflux and is currently taking Pepcid at bedtime, having stopped omeprazole due to side effects. She has given up alcohol for [place] and has cut back on snacks, but struggles with certain foods like rice, bread, and meat, which do not go down well despite having her esophagus  stretched twice. Occasional constipation and joint pain, particularly when working out.  Her weight has been increasing despite dietary changes and exercise, and she is concerned about her thyroid's role in this. She is interested in weight loss medications but is unsure if she qualifies based on BMI and insurance coverage.      Social History   Tobacco Use  Smoking Status Never  Smokeless Tobacco Never    Current Outpatient Medications on File Prior to Visit  Medication Sig Dispense Refill   amLODipine (NORVASC) 5 MG tablet Take 1 tablet (5 mg total) by mouth daily. 90 tablet 3   Aspirin 81 MG CAPS Take by mouth daily at 8 pm.     atorvastatin (LIPITOR) 20 MG tablet Take 1 tablet (20 mg total) by mouth daily. 90 tablet 3   CALCIUM PO Take 1 tablet by mouth daily.     Cyanocobalamin (VITAMIN B 12 PO) Take 1 tablet by mouth daily at 6 (six) AM.     famotidine (PEPCID) 20 MG tablet TAKE 1 TABLET BY MOUTH AT BEDTIME 90 tablet 3   ibuprofen (ADVIL) 800 MG tablet Take 800 mg by mouth every 8 (eight) hours as needed.     levothyroxine (SYNTHROID) 200 MCG tablet Take 1 tablet (200 mcg total) by mouth daily. 90 tablet 0   Multiple Vitamin (MULTIVITAMIN PO) Take 1 tablet by mouth daily at 6 (six) AM.     omeprazole (PRILOSEC) 40 MG capsule Take 1 capsule (40 mg total) by mouth daily before breakfast. 90 capsule 3   Probiotic Product (CULTURELLE PROBIOTICS  PO) Take 1 tablet by mouth daily at 6 (six) AM.     triamterene-hydrochlorothiazide (MAXZIDE-25) 37.5-25 MG tablet Take 0.5 tablets by mouth daily. 45 tablet 3   TURMERIC PO Take 2 capsules by mouth daily at 6 (six) AM.     VITAMIN D PO Take 1 tablet by mouth daily at 6 (six) AM.     [DISCONTINUED] traZODone (DESYREL) 50 MG tablet Take 0.5-1 tablets (25-50 mg total) by mouth at bedtime as needed for sleep. 90 tablet 3   No current facility-administered medications on file prior to visit.    ROS see history of present  illness  Objective  Physical Exam Vitals:   12/08/23 1304  BP: 100/60  Pulse: 68  Temp: 98.3 F (36.8 C)  SpO2: 100%    BP Readings from Last 3 Encounters:  12/08/23 100/60  11/24/23 128/76  11/04/23 108/76   Wt Readings from Last 3 Encounters:  12/08/23 170 lb 9.6 oz (77.4 kg)  11/24/23 170 lb (77.1 kg)  11/04/23 173 lb 3.2 oz (78.6 kg)    Physical Exam Constitutional:      General: She is not in acute distress.    Appearance: Normal appearance.  HENT:     Head: Normocephalic.  Cardiovascular:     Rate and Rhythm: Normal rate and regular rhythm.     Heart sounds: Normal heart sounds.  Pulmonary:     Effort: Pulmonary effort is normal.     Breath sounds: Normal breath sounds.  Skin:    General: Skin is warm and dry.  Neurological:     General: No focal deficit present.     Mental Status: She is alert.  Psychiatric:        Mood and Affect: Mood normal.        Behavior: Behavior normal.     Assessment/Plan: Please see individual problem list.  Essential hypertension Assessment & Plan: Her blood pressure is well-controlled with Norvasc and triamterene/hydrochlorothiazide, though medication adherence is inconsistent. She is advised to take medication as prescribed to avoid hypotension. Continue triamterene/hydrochlorothiazide 37.5-25 mg half a tablet daily and Norvasc 5 mg daily.   Orders: -     Comprehensive metabolic panel  Overweight (BMI 25.0-29.9) Assessment & Plan: She is concerned about weight gain and interested in semaglutide for weight loss, though insurance coverage is uncertain. BMI of 29 today. Will start the patient on Wegovy. Counseled on the risk of pancreatitis and gallbladder disease. Discussed the risk of nausea. Advised to discontinue the [facility] and contact us immediately if they develop abdominal pain. If they develop excessive nausea they will contact us right away. I discussed that medullary thyroid cancer has been seen in rats studies.  The patient confirmed no personal or family history of thyroid cancer, parathyroid cancer, or adrenal gland cancer. Discussed that we thus far have not seen medullary thyroid cancer result from use of this type of medication in humans. Advised to monitor the thyroid area and contact us for any lumps, swelling, trouble swallowing, or any other changes in this area.  Discussed goal weight loss of 1 to 2 pounds a week while on this medication. Discussed the importance of healthy diet, exercise and lifestyle modifications even with this medication. She will follow up in 6 weeks to assess medication tolerance and effectiveness.   Orders: -     Wegovy; Inject 0.25 mg into the skin once a week.  Dispense: 2 mL; Refill: 0 -     Wegovy; Inject 0.5 mg into  the skin once a week.  Dispense: 2 mL; Refill: 0  Hypothyroidism, unspecified type Assessment & Plan: Her TSH was elevated in November, and she has been inconsistent with levothyroxine adherence, though she reports no thyroid-related symptoms. Thyroid levels will be checked, and follow-up with endocrinology is planned for thyroid management. Encouraged to take medication daily as prescribed.   Orders: -     T4, free -     TSH  Osteoporosis, unspecified osteoporosis type, unspecified pathological fracture presence Assessment & Plan: Her osteoporosis is managed with a recent reclast infusion, with no new symptoms or complications. Follow up with Endocrinology as scheduled.    Prediabetes -     ZOXWRU; Inject 0.25 mg into the skin once a week.  Dispense: 2 mL; Refill: 0 -     Wegovy; Inject 0.5 mg into the skin once a week.  Dispense: 2 mL; Refill: 0  Gastroesophageal reflux disease without esophagitis Assessment & Plan: Reflux is somewhat controlled with Pepcid and dietary modifications. Omeprazole was discontinued due to side effects. Continue Pepcid daily and Omeprazole as needed. Advised to avoid dietary triggers.    Right rotator cuff  tendonitis Assessment & Plan: She experiences persistent right shoulder issues due to rotator cuff tears and tendonitis, with physical therapy proving ineffective. A shoulder injection is planned for March 31st. Follow up with Ortho as scheduled.      Return in about 6 weeks (around 01/19/2024) for Follow up.   Jasmine Dicker, NP-C [facility]

## 2023-12-08 NOTE — Assessment & Plan Note (Signed)
 Her osteoporosis is managed with a recent reclast infusion, with no new symptoms or complications. Follow up with Endocrinology as scheduled.

## 2023-12-08 NOTE — Assessment & Plan Note (Signed)
 She experiences persistent right shoulder issues due to rotator cuff tears and tendonitis, with physical therapy proving ineffective. A shoulder injection is planned for March 31st. Follow up with Ortho as scheduled.

## 2023-12-08 NOTE — Assessment & Plan Note (Addendum)
 Her TSH was elevated in November, and she has been inconsistent with levothyroxine adherence, though she reports no thyroid-related symptoms. Thyroid levels will be checked, and follow-up with endocrinology is planned for thyroid management. Encouraged to take medication daily as prescribed.

## 2023-12-09 ENCOUNTER — Encounter: Payer: Self-pay | Admitting: Nurse Practitioner

## 2023-12-10 ENCOUNTER — Other Ambulatory Visit (HOSPITAL_COMMUNITY): Payer: Self-pay

## 2023-12-10 ENCOUNTER — Telehealth: Payer: Self-pay

## 2023-12-10 NOTE — Telephone Encounter (Signed)
 Pharmacy Patient Advocate Encounter   Received notification from Pt Calls Messages that prior authorization for Wegovy 0.25 is required/requested.   Insurance verification completed.   The patient is insured through [facility] .   Per test claim: PA required; PA started via CoverMyMeds. KEY [id number] . Waiting for clinical questions to populate.

## 2023-12-10 NOTE — Telephone Encounter (Signed)
 Wegovy PA needed

## 2023-12-11 ENCOUNTER — Telehealth: Payer: Self-pay

## 2023-12-11 NOTE — Telephone Encounter (Signed)
 Copied from CRM [id number]. Topic: Clinical - Prescription Issue >> Dec 11, 2023 11:29 AM Elizebeth Brooking wrote: Reason for CRM: [employer] called in stated that prior auth was denied because of the diagnosis and that GOP1 can't not be prescribed for a weight loss drug    [phone] option 5

## 2023-12-15 ENCOUNTER — Other Ambulatory Visit (HOSPITAL_COMMUNITY): Payer: Self-pay

## 2023-12-15 ENCOUNTER — Ambulatory Visit
Admission: RE | Admit: 2023-12-15 | Discharge: 2023-12-15 | Disposition: A | Discharge status: Home / Self Care | Attending: Physician Assistant | Admitting: Physician Assistant

## 2023-12-15 ENCOUNTER — Ambulatory Visit: Admitting: Physician Assistant

## 2023-12-15 ENCOUNTER — Ambulatory Visit: Admitting: Neurosurgery

## 2023-12-15 ENCOUNTER — Ambulatory Visit
Admission: RE | Admit: 2023-12-15 | Discharge: 2023-12-15 | Disposition: A | Discharge status: Home / Self Care | Source: Ambulatory Visit | Attending: Physician Assistant | Admitting: Physician Assistant

## 2023-12-15 ENCOUNTER — Encounter: Payer: Self-pay | Admitting: Physician Assistant

## 2023-12-15 ENCOUNTER — Encounter: Payer: Self-pay | Admitting: Neurology

## 2023-12-15 VITALS — BP 110/70 | Ht 64.0 in | Wt 170.0 lb

## 2023-12-15 DIAGNOSIS — M67813 Other specified disorders of tendon, right shoulder: Secondary | ICD-10-CM

## 2023-12-15 DIAGNOSIS — R200 Anesthesia of skin: Secondary | ICD-10-CM | POA: Insufficient documentation

## 2023-12-15 DIAGNOSIS — Z981 Arthrodesis status: Secondary | ICD-10-CM

## 2023-12-15 DIAGNOSIS — R202 Paresthesia of skin: Secondary | ICD-10-CM

## 2023-12-15 DIAGNOSIS — R29898 Other symptoms and signs involving the musculoskeletal system: Secondary | ICD-10-CM

## 2023-12-15 DIAGNOSIS — M545 Low back pain, unspecified: Secondary | ICD-10-CM

## 2023-12-15 MED ORDER — GABAPENTIN 300 MG PO CAPS: 300.0000 mg | ORAL_CAPSULE | Freq: Three times a day (TID) | ORAL | 2 refills | Status: DC

## 2023-12-15 NOTE — Progress Notes (Signed)
 Progress Note: Referring Physician:  No referring provider defined for this encounter.  Primary Physician:  Jasmine Dicker, NP  Chief Complaint: Neck and back pain  History of Present Illness: [name] is a 68 y.o. female who presents with the chief complaint of neck and back pain.  She underwent C5-7 ACDF in August 2023 with Dr. Marcell [name].  She states she is frustrated because she has had right upper extremity weakness since surgery and feels as though she is not been able to regain good grip strength especially of her right hand despite extensive physical therapy and home exercises.  She feels a "nerve sensation" including tingling that goes from her neck into her right hand.  She is unsure if this incorporates all 5 of her fingers or only some.  She feels as though this weakness is having effect on her activities of daily living and she was expecting it to improve by now with therapy and home exercises.  Of note, she does have known rotator cuff tendinitis in her right shoulder.  In addition she also has low back pain.  She has pain that goes to the outside of her right thigh and right she has been treated with LOC had injections previously.  She does states that these injections are helpful for her.  She also has pain that goes in to her back and her buttock and periodically down her leg although this is not consistent.  She denies any weakness in her legs or saddle anesthesia.   Exam: Today's Vitals   12/15/23 1117  BP: 110/70  Weight: 170 lb (77.1 kg)  Height: 5\' 4"  (1.626 m)  PainSc: 8   PainLoc: Arm   Body mass index is 29.18 kg/m.    NEUROLOGICAL:  General: In no acute distress.   Awake, alert, oriented to person, place, and time.  Pupils equal round and reactive to light.  Facial tone is symmetric.   ROM of spine: No tenderness to palpation of cervical spine.  Strength: Side Biceps Triceps Deltoid Interossei Grip Wrist Ext. Wrist Flex.  R 4 4 4  4- 3 4 4   L 5 5 4   4+ 5 5 5      Imaging: Lumbar spine completed, 08/27/2023: IMPRESSION: 1. No fracture or acute finding. 2. Mild degenerative changes as detailed.    I have personally reviewed the images and agree with the above interpretation.  Assessment and Plan: [name] is a pleasant 68 y.o. female who presents with the chief complaint of neck and back pain.  She underwent C5-7 ACDF in August 2023 with Dr. Marcell [name].  She states she is frustrated because she has had right upper extremity weakness since surgery and feels as though she is not been able to regain good grip strength especially of her right hand despite extensive physical therapy and home exercises.  She feels a "nerve sensation" including tingling that goes from her neck into her right hand.  She is unsure if this incorporates all 5 of her fingers or only some.  She feels as though this weakness is having effect on her activities of daily living and she was expecting it to improve by now with therapy and home exercises.  Of note, she does have known rotator cuff tendinitis in her right shoulder.In addition she also has low back pain.  She has pain that goes to the outside of her right thigh and right she has been treated with LOC had injections previously.  She does states that these  injections are helpful for her.  She also has pain that goes in to her back and her buttock and periodically down her leg although this is not consistent.  She denies any weakness in her legs or saddle anesthesia.  On examination she does have extensive weakness in her right upper extremity worse in her grip and intrinsic function.  Most recent films were of her lumbar spine 3 months ago which showed mild degenerative changes in her lumbar spine.  She does currently go to our pain management group for injections which she feels as though does help her pain.  It was a pleasure to see the patient in clinic today.  Her largest concern is the nerve pain that she is  experiencing, especially in her right upper extremity  -Referral to neurology for EMG of right upper extremity to evaluate nerve conduction function -X-rays today of her cervical spine to ensure no hardware loosening and evaluate for generative changes and listhesis -Trial gabapentin for patient's neuropathic pain.  The risk and benefits of this medication were discussed at great length.  She would like to proceed with a trial of the medication.  Would like her to start off with just taking medication at night and then slowly increase over the coming weeks.  She is advised that if she has any adverse symptoms that she should stop taking the medication. -Will discuss results of imaging and EMG with the patient once they are complete.   I spent a total of 45 minutes in both face-to-face and non-face-to-face activities for this visit on the date of this encounter including review of her surgical history, outside records, x-rays, her physical examination, a personal detailed history, making referral to neurology, ordering x-rays, counseling patient coordinating care.  Jasmine Flores PA-C Neurosurgery

## 2023-12-15 NOTE — Telephone Encounter (Signed)
 Pt was notified on yesterday as we received the denial via fax

## 2023-12-15 NOTE — Telephone Encounter (Signed)
 Pharmacy Patient Advocate Encounter  Received notification from [facility] that Prior Authorization for Wegovy 0.25mg /0.27ml has been DENIED.  Full denial letter will be uploaded to the media tab. See denial reason below.    PA #/Case ID/Reference #: Not given, please see full denial letter attached in pt's media

## 2023-12-16 ENCOUNTER — Other Ambulatory Visit: Payer: Self-pay

## 2023-12-16 DIAGNOSIS — R29898 Other symptoms and signs involving the musculoskeletal system: Secondary | ICD-10-CM

## 2023-12-21 ENCOUNTER — Ambulatory Visit
Attending: Student in an Organized Health Care Education/Training Program | Admitting: Student in an Organized Health Care Education/Training Program

## 2023-12-21 ENCOUNTER — Ambulatory Visit
Admission: RE | Admit: 2023-12-21 | Discharge: 2023-12-21 | Disposition: A | Discharge status: Home / Self Care | Source: Ambulatory Visit | Attending: Student in an Organized Health Care Education/Training Program | Admitting: Student in an Organized Health Care Education/Training Program

## 2023-12-21 VITALS — BP 113/77 | HR 81 | Temp 98.0°F | Resp 18 | Ht 64.0 in | Wt 168.0 lb

## 2023-12-21 DIAGNOSIS — G8929 Other chronic pain: Secondary | ICD-10-CM | POA: Diagnosis not present

## 2023-12-21 DIAGNOSIS — M25511 Pain in right shoulder: Secondary | ICD-10-CM | POA: Insufficient documentation

## 2023-12-21 DIAGNOSIS — G894 Chronic pain syndrome: Secondary | ICD-10-CM | POA: Insufficient documentation

## 2023-12-21 DIAGNOSIS — M19111 Post-traumatic osteoarthritis, right shoulder: Secondary | ICD-10-CM | POA: Insufficient documentation

## 2023-12-21 DIAGNOSIS — S46001S Unspecified injury of muscle(s) and tendon(s) of the rotator cuff of right shoulder, sequela: Secondary | ICD-10-CM | POA: Insufficient documentation

## 2023-12-21 MED ORDER — IOHEXOL 180 MG/ML  SOLN
10.0000 mL | Freq: Once | INTRAMUSCULAR | Status: AC
  Administered 2023-12-21: 10 mL via INTRA_ARTICULAR
  Filled 2023-12-21: qty 20

## 2023-12-21 MED ORDER — LIDOCAINE HCL 2 % IJ SOLN
20.0000 mL | Freq: Once | INTRAMUSCULAR | Status: AC
  Administered 2023-12-21: 200 mg
  Filled 2023-12-21: qty 20

## 2023-12-21 MED ORDER — METHYLPREDNISOLONE ACETATE 40 MG/ML IJ SUSP
40.0000 mg | Freq: Once | INTRAMUSCULAR | Status: AC
  Administered 2023-12-21: 40 mg via INTRA_ARTICULAR
  Filled 2023-12-21: qty 1

## 2023-12-21 MED ORDER — ROPIVACAINE HCL 2 MG/ML IJ SOLN
4.0000 mL | Freq: Once | INTRAMUSCULAR | Status: AC
  Administered 2023-12-21: 4 mL via INTRA_ARTICULAR
  Filled 2023-12-21: qty 20

## 2023-12-21 NOTE — Patient Instructions (Signed)

## 2023-12-21 NOTE — Progress Notes (Signed)
 PROVIDER NOTE: Interpretation of information contained herein should be left to medically-trained personnel. Specific patient instructions are provided elsewhere under "Patient Instructions" section of medical record. This document was created in part using STT-dictation technology, any transcriptional errors that may result from this process are unintentional.  Patient: [name] Type: Established DOB: 1955-12-01 MRN: [id number] PCP: Bethanie Dicker, NP  Service: Procedure DOS: 12/21/2023 Setting: Ambulatory Location: Ambulatory outpatient facility Delivery: Face-to-face Provider: Edward Jolly, MD Specialty: Interventional Pain Management Specialty designation: 09 Location: Outpatient facility Ref. Prov.: Bethanie Dicker, NP       Interventional Therapy   Procedure: Glenohumeral Joint (shoulder) Injection #1  Laterality: Right (-RT)  Level: Shoulder   Imaging: Fluoroscopy-guided         Anesthesia: Local anesthesia (1-2% Lidocaine) DOS: 12/21/2023  Performed by: Edward Jolly, MD  Purpose: Diagnostic/Therapeutic Indications: Shoulder pain severe enough to impact quality of life or function. Rationale (medical necessity): procedure needed and proper for the diagnosis and/or treatment of [name]'s medical symptoms and needs. 1. 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 Chronic right shoulder pain   3. Chronic pain syndrome    NAS-11 Pain score:   Pre-procedure: 8 /10   Post-procedure: 0-No pain/10      Target: Glenohumeral Joint (shoulder) Location: Intra-articular  Region: Entire Shoulder Area Approach: Anterolateral approach. Type of procedure: Percutaneous joint injection   Position  Prep  Materials:  Position: Supine Prep solution: ChloraPrep (2% chlorhexidine gluconate and 70% isopropyl alcohol) Prep Area: Entire shoulder Area Materials:  Tray: Block Needle(s):  Type: Spinal  Gauge (G): 22  Length: 3.5-in  Qty: 1  H&P (Pre-op  Assessment):  [name] is a 68 y.o. (year old), female patient, seen today for interventional treatment. She  has a past surgical history that includes Back surgery; Breast surgery; Augmentation mammaplasty (Bilateral, 2006); Esophagogastroduodenoscopy (egd) with propofol (N/A, 10/24/2020); Esophagogastroduodenoscopy (egd) with propofol (N/A, 12/20/2020); Esophageal manometry (N/A, 03/04/2021); 24 hour ph study (N/A, 03/04/2021); PH impedance study (N/A, 03/04/2021); Vein Surgery (1991); Upper gastrointestinal endoscopy; Colonoscopy; Anterior cervical decomp/discectomy fusion (N/A, 05/12/2022); and Esophagogastroduodenoscopy (egd) with propofol (N/A, 11/11/2022). [name] has a current medication list which includes the following prescription(s): amlodipine, aspirin, atorvastatin, calcium, cyanocobalamin, cyclobenzaprine, famotidine, gabapentin, levothyroxine, meloxicam, multiple vitamin, omeprazole, probiotic product, triamterene-hydrochlorothiazide, turmeric, vitamin d, and [DISCONTINUED] trazodone. Her primarily concern today is the Shoulder Pain (right)  Initial Vital Signs:  Pulse/HCG Rate: 81ECG Heart Rate: 72 Temp: 98 F (36.7 C) Resp: 16 BP: 118/71 SpO2: 100 %  BMI: Estimated body mass index is 28.84 kg/m as calculated from the following:   Height as of this encounter: 5\' 4"  (1.626 m).   Weight as of this encounter: 168 lb (76.2 kg).  Risk Assessment: Allergies: Reviewed. She has no known allergies.  Allergy Precautions: None required Coagulopathies: Reviewed. None identified.  Blood-thinner therapy: None at this time Active Infection(s): Reviewed. None identified. [name] is afebrile  Site Confirmation: [name] was asked to confirm the procedure and laterality before marking the site Procedure checklist: Completed Consent: Before the procedure and under the influence of no sedative(s), amnesic(s), or anxiolytics, the patient was informed of the treatment options, risks and  possible complications. To fulfill our ethical and legal obligations, as recommended by the American Medical Association's Code of Ethics, I have informed the patient of my clinical impression; the nature and purpose of the treatment or procedure; the risks, benefits, and possible complications of the intervention; the alternatives, including doing nothing;  the risk(s) and benefit(s) of the alternative treatment(s) or procedure(s); and the risk(s) and benefit(s) of doing nothing. The patient was provided information about the general risks and possible complications associated with the procedure. These may include, but are not limited to: failure to achieve desired goals, infection, bleeding, organ or nerve damage, allergic reactions, paralysis, and death. In addition, the patient was informed of those risks and complications associated to the procedure, such as failure to decrease pain; infection; bleeding; organ or nerve damage with subsequent damage to sensory, motor, and/or autonomic systems, resulting in permanent pain, numbness, and/or weakness of one or several areas of the body; allergic reactions; (i.e.: anaphylactic reaction); and/or death. Furthermore, the patient was informed of those risks and complications associated with the medications. These include, but are not limited to: allergic reactions (i.e.: anaphylactic or anaphylactoid reaction(s)); adrenal axis suppression; blood sugar elevation that in diabetics may result in ketoacidosis or comma; water retention that in patients with history of congestive heart failure may result in shortness of breath, pulmonary edema, and decompensation with resultant heart failure; weight gain; swelling or edema; medication-induced neural toxicity; particulate matter embolism and blood vessel occlusion with resultant organ, and/or nervous system infarction; and/or aseptic necrosis of one or more joints. Finally, the patient was informed that Medicine is not an  exact science; therefore, there is also the possibility of unforeseen or unpredictable risks and/or possible complications that may result in a catastrophic outcome. The patient indicated having understood very clearly. We have given the patient no guarantees and we have made no promises. Enough time was given to the patient to ask questions, all of which were answered to the patient's satisfaction. Ms. Rolison has indicated that she wanted to continue with the procedure. Attestation: I, the ordering provider, attest that I have discussed with the patient the benefits, risks, side-effects, alternatives, likelihood of achieving goals, and potential problems during recovery for the procedure that I have provided informed consent. Date  Time: 12/21/2023  8:53 AM   Imaging Guidance (Non-Spinal):          Type of Imaging Technique: Fluoroscopy Guidance (Non-Spinal) Indication(s): Fluoroscopy guidance for needle placement to enhance accuracy in procedures requiring precise needle localization for targeted delivery of medication in or near specific anatomical locations not easily accessible without such real-time imaging assistance. Exposure Time: Please see nurses notes. Contrast: Before injecting any contrast, we confirmed that the patient did not have an allergy to iodine, shellfish, or radiological contrast. Once satisfactory needle placement was completed at the desired level, radiological contrast was injected. Contrast injected under live fluoroscopy. No contrast complications. See chart for type and volume of contrast used. Fluoroscopic Guidance: I was personally present during the use of fluoroscopy. "Tunnel Vision Technique" used to obtain the best possible view of the target area. Parallax error corrected before commencing the procedure. "Direction-depth-direction" technique used to introduce the needle under continuous pulsed fluoroscopy. Once target was reached, antero-posterior, oblique, and lateral  fluoroscopic projection used confirm needle placement in all planes. Images permanently stored in EMR. Interpretation: I personally interpreted the imaging intraoperatively. Adequate needle placement confirmed in multiple planes. Appropriate spread of contrast into desired area was observed. No evidence of afferent or efferent intravascular uptake. Permanent images saved into the patient's record.  Pre-Procedure Preparation:  Monitoring: As per clinic protocol. Respiration, ETCO2, SpO2, BP, heart rate and rhythm monitor placed and checked for adequate function Safety Precautions: Patient was assessed for positional comfort and pressure points before starting the procedure. Time-out: I  initiated and conducted the "Time-out" before starting the procedure, as per protocol. The patient was asked to participate by confirming the accuracy of the "Time Out" information. Verification of the correct person, site, and procedure were performed and confirmed by me, the nursing staff, and the patient. "Time-out" conducted as per Joint Commission's Universal Protocol (UP.01.01.01). Time: 0940 Start Time: 0940 hrs.  Description  Narrative of Procedure:          Rationale (medical necessity): procedure needed and proper for the diagnosis and/or treatment of the patient's medical symptoms and needs. Procedural Technique Safety Precautions: Aspiration looking for blood return was conducted prior to all injections. At no point did we inject any substances, as a needle was being advanced. No attempts were made at seeking any paresthesias. Safe injection practices and needle disposal techniques used. Medications properly checked for expiration dates. SDV (single dose vial) medications used. Description of the Procedure: Protocol guidelines were followed. The patient was placed in position over the procedure table. The target area was identified and the area prepped in the usual manner. Skin & deeper tissues infiltrated  with local anesthetic. Appropriate amount of time allowed to pass for local anesthetics to take effect. The procedure needles were then advanced to the target area. Proper needle placement secured. Negative aspiration confirmed. Solution injected in intermittent fashion, asking for systemic symptoms every 0.5cc of injectate. The needles were then removed and the area cleansed, making sure to leave some of the prepping solution back to take advantage of its long term bactericidal properties.             Vitals:   12/21/23 0929 12/21/23 0934 12/21/23 0939 12/21/23 0943  BP: 108/78 113/82 130/75 113/77  Pulse:      Resp: 14 14 20 18   Temp:      SpO2: 100% 100% 96% 100%  Weight:      Height:         Start Time: 0940 hrs. End Time: 0943 hrs.  Antibiotic Prophylaxis:   Anti-infectives (From admission, onward)    None      Indication(s): None identified  Post-operative Assessment:  Post-procedure Vital Signs:  Pulse/HCG Rate: 8171 Temp: 98 F (36.7 C) Resp: 18 BP: 113/77 SpO2: 100 %  EBL: None  Complications: No immediate post-treatment complications observed by team, or reported by patient.  Note: The patient tolerated the entire procedure well. A repeat set of vitals were taken after the procedure and the patient was kept under observation following institutional policy, for this type of procedure. Post-procedural neurological assessment was performed, showing return to baseline, prior to discharge. The patient was provided with post-procedure discharge instructions, including a section on how to identify potential problems. Should any problems arise concerning this procedure, the patient was given instructions to immediately contact us, at any time, without hesitation. In any case, we plan to contact the patient by telephone for a follow-up status report regarding this interventional procedure.  Comments:  No additional relevant information.  Plan of Care (POC)  Orders:   Orders Placed This Encounter  Procedures   DG PAIN CLINIC C-ARM 1-60 MIN NO REPORT    Intraoperative interpretation by procedural physician at [facility].    Standing Status:   Standing    Number of Occurrences:   1    Reason for exam::   Assistance in needle guidance and placement for procedures requiring needle placement in or near specific anatomical locations not easily accessible without such assistance.  Medications ordered for procedure: Meds ordered this encounter  Medications   iohexol (OMNIPAQUE) 180 MG/ML injection 10 mL    Must be Myelogram-compatible. If not available, you may substitute with a water-soluble, non-ionic, hypoallergenic, myelogram-compatible radiological contrast medium.   lidocaine (XYLOCAINE) 2 % (with pres) injection 400 mg   methylPREDNISolone acetate (DEPO-MEDROL) injection 40 mg   ropivacaine (PF) 2 mg/mL (0.2%) (NAROPIN) injection 4 mL   Medications administered: We administered iohexol, lidocaine, methylPREDNISolone acetate, and ropivacaine (PF) 2 mg/mL (0.2%).  See the medical record for exact dosing, route, and time of administration.  Follow-up plan:   Return in about 4 weeks (around 01/18/2024) for 4 - 5 weeks PPE F2F .       Right SI-J and Right Piriformis 04/17/21, 09/02/21. 11/12/22, RIGHT LFCN 06/17/23  Right glenohumeral joint injection 12/21/2023     Recent Visits Date Type Provider Dept  11/24/23 Office Visit Edward Jolly, MD [facility]  Showing recent visits within past 90 days and meeting all other requirements Today's Visits Date Type Provider Dept  12/21/23 Procedure visit Edward Jolly, MD [facility]  Showing today's visits and meeting all other requirements Future Appointments Date Type Provider Dept  01/20/24 Appointment Edward Jolly, MD [facility]  Showing future appointments within next 90 days and meeting all other requirements  Disposition: Discharge home  Discharge  (Date  Time): 12/21/2023; 0950 hrs.   Primary Care Physician: Bethanie Dicker, NP Location: [facility] Note by: Edward Jolly, MD (TTS technology used. I apologize for any typographical errors that were not detected and corrected.) Date: 12/21/2023; Time: 10:16 AM  Disclaimer:  Medicine is not an Visual merchandiser. The only guarantee in medicine is that nothing is guaranteed. It is important to note that the decision to proceed with this intervention was based on the information collected from the patient. The Data and conclusions were drawn from the patient's questionnaire, the interview, and the physical examination. Because the information was provided in large part by the patient, it cannot be guaranteed that it has not been purposely or unconsciously manipulated. Every effort has been made to obtain as much relevant data as possible for this evaluation. It is important to note that the conclusions that lead to this procedure are derived in large part from the available data. Always take into account that the treatment will also be dependent on availability of resources and existing treatment guidelines, considered by other Pain Management Practitioners as being common knowledge and practice, at the time of the intervention. For Medico-Legal purposes, it is also important to point out that variation in procedural techniques and pharmacological choices are the acceptable norm. The indications, contraindications, technique, and results of the above procedure should only be interpreted and judged by a Board-Certified Interventional Pain Specialist with extensive familiarity and expertise in the same exact procedure and technique.

## 2023-12-21 NOTE — Progress Notes (Signed)
 Safety precautions to be maintained throughout the outpatient stay will include: orient to surroundings, keep bed in low position, maintain call bell within reach at all times, provide assistance with transfer out of bed and ambulation.

## 2023-12-22 ENCOUNTER — Telehealth: Payer: Self-pay | Admitting: *Deleted

## 2023-12-22 DIAGNOSIS — K080 Exfoliation of teeth due to systemic causes: Secondary | ICD-10-CM | POA: Diagnosis not present

## 2023-12-22 NOTE — Progress Notes (Unsigned)
 Cardiology Office Note:  .   Date:  12/23/2023  ID:  [name], DOB 03/16/1956, MRN [id number] PCP: Bethanie Dicker, NP  [facility] Providers Cardiologist:  None    History of Present Illness: .   [name] is a 68 y.o. female with a past medical history of hypertension, hypothyroidism, arthritis, depression/anxiety, palpitations, lacunar stroke, who is here today for follow-up.   September 2023 after MRI of the brain showed remote clinic or infarct with minimal chronic microvascular changes. She was asymptomatic at that time but continued to have weakness and decreased range of motion following an ACDF. Prior echocardiogram completed 07/17/2022 revealed an EF of 60 to 65%, no RWMA, G1 DD, left atrial size with mildly dilated, and mild mitral valve regurgitation with negative bubble study. Event monitor revealed rare PACs and PVCs and few episodes of brief PSVT. There was no atrial fibrillation/flutter was observed. She was continued on her current medication regimen.    She was last seen in clinic 06/12/2023 and stated she was doing well from a cardiac perspective.  She denied any recurrent chest pain or shortness of breath.  She continued to have dysphagia and was scheduled for an esophageal dilatation in the upcoming weeks.  There were no changes made to her medications or further testing that was needed at that time.  She returns to clinic today stating that overall she has been doing well.  She states that issues with allergic tinnitus since the increased amount of pop when doing the time here.  She states that she often times has reflux and is suffer from constipation and has upcoming follow-up with her GI provider.  She states that she has been compliant with her current medications.  No fevers.  Denies any hospitalizations or visits to the emergency department.  ROS: 10 point review of systems reviewed and reviewed with exception was placed in the HPI  Studies Reviewed: Marland Kitchen   EKG  Interpretation Date/Time:  Wednesday December 23 2023 10:44:29 EDT Ventricular Rate:  70 PR Interval:  136 QRS Duration:  72 QT Interval:  392 QTC Calculation: 423 R Axis:   18  Text Interpretation: Normal sinus rhythm Normal ECG When compared with ECG of 12-Jun-2023 10:09, No significant change was found Confirmed by Charlsie Quest ([id number]) on 12/23/2023 10:57:22 AM    Coronary CTA 12/04/22 IMPRESSION: 1. Normal coronary calcium score of 0. Patient is low risk.   2. Normal coronary origin with right dominance.   3. No evidence of CAD.   4. CAD-RADS 0. Consider non-atherosclerotic causes of chest pain.   EP Procedures and Devices: Event monitor (06/13/2022): Patient was monitored for 11 days, 23 hours.  Predominantly sinus rhythm with rare PACs and PVCs as well as a few brief episodes of PSVT lasting up to 8 beats.  No evidence of atrial fibrillation/flutter.   Non-Invasive Evaluation(s): TTE with bubble study (07/17/2022): Normal LV size with borderline LVH.  LVEF 60-65% with normal wall motion.  Grade 1 diastolic dysfunction.  Normal RV size and function.  Mild left atrial enlargement.  Mild mitral regurgitation.  Otherwise, no significant valvular abnormality.  CVP 3.  Bubble study negative for interatrial shunt. Risk Assessment/Calculations:             Physical Exam:   VS:  BP 108/70   Pulse 70   Ht 5\' 4"  (1.626 m)   Wt 173 lb 3.2 oz (78.6 kg)   SpO2 98%   BMI 29.73 kg/m  Wt Readings from Last 3 Encounters:  12/23/23 173 lb 3.2 oz (78.6 kg)  12/21/23 168 lb (76.2 kg)  12/15/23 170 lb (77.1 kg)    GEN: Well nourished, well developed in no acute distress NECK: No JVD; No carotid bruits CARDIAC: RRR, II/VI systolic murmur RUSB without rubs or gallops RESPIRATORY:  Clear to auscultation without rales, wheezing or rhonchi  ABDOMEN: Soft, non-tender, non-distended EXTREMITIES:  No edema; No deformity   ASSESSMENT AND PLAN: .   Primary hypertension with blood pressure  108/70.  She is continued on amlodipine 5 mg daily and  triamterene-HCTZ 37.5-25 mg half tablet daily.  Blood pressure has been very well-controlled.  She has been encouraged to continue to take her blood pressure 1 to 2 hours postmedication administration.  Lacunar infarct which was an incidental finding on brain MRI of the setting of dizziness.  CT of the head and neck did not show significant cervical intracranial stenosis.  Study was negative as well.  She is continued on aspirin 81 mg daily and atorvastatin 20 mg daily.  Hypothyroidism she is continued on levothyroxine.  Ongoing management by her PCP.  History of atypical chest pain that she relates to GERD.  Prior coronary CTA was completed 12/04/2022 which revealed a calcium score of 0.  EKG today reveals sinus rhythm with a rate of 66 with no ischemic changes noted.  No further ischemic evaluation is needed at this time.       Dispo: Patient to return to see MD/APP in 6 months or sooner if needed  Signed, Dania Marsan, NP

## 2023-12-22 NOTE — Telephone Encounter (Signed)
 No problems post procedure.

## 2023-12-23 ENCOUNTER — Encounter: Payer: Self-pay | Admitting: Cardiology

## 2023-12-23 ENCOUNTER — Ambulatory Visit: Attending: Cardiology | Admitting: Cardiology

## 2023-12-23 VITALS — BP 108/70 | HR 70 | Ht 64.0 in | Wt 173.2 lb

## 2023-12-23 DIAGNOSIS — Z8673 Personal history of transient ischemic attack (TIA), and cerebral infarction without residual deficits: Secondary | ICD-10-CM

## 2023-12-23 DIAGNOSIS — R0789 Other chest pain: Secondary | ICD-10-CM

## 2023-12-23 DIAGNOSIS — E039 Hypothyroidism, unspecified: Secondary | ICD-10-CM

## 2023-12-23 DIAGNOSIS — I10 Essential (primary) hypertension: Secondary | ICD-10-CM | POA: Diagnosis not present

## 2023-12-23 NOTE — Patient Instructions (Signed)
 Medication Instructions:  Your physician recommends that you continue on your current medications as directed. Please refer to the Current Medication list given to you today.   *If you need a refill on your cardiac medications before your next appointment, please call your pharmacy*  Lab Work: No labs ordered today   Testing/Procedures: No test ordered today   Follow-Up: At [facility], you and your health needs are our priority.  As part of our continuing mission to provide you with exceptional heart care, our providers are all part of one team.  This team includes your primary Cardiologist (physician) and Advanced Practice Providers or APPs (Physician Assistants and Nurse Practitioners) who all work together to provide you with the care you need, when you need it.  Your next appointment:   6 month(s)  Provider:   Charlsie Quest, NP   We recommend signing up for the patient portal called "MyChart".  Sign up information is provided on this After Visit Summary.  MyChart is used to connect with patients for Virtual Visits (Telemedicine).  Patients are able to view lab/test results, encounter notes, upcoming appointments, etc.  Non-urgent messages can be sent to your provider as well.   To learn more about what you can do with MyChart, go to [email or web address].

## 2023-12-24 ENCOUNTER — Ambulatory Visit (INDEPENDENT_AMBULATORY_CARE_PROVIDER_SITE_OTHER): Admitting: Family

## 2023-12-24 ENCOUNTER — Encounter: Payer: Self-pay | Admitting: Family

## 2023-12-24 VITALS — BP 136/70 | HR 76 | Temp 97.1°F | Ht 65.0 in | Wt 171.4 lb

## 2023-12-24 DIAGNOSIS — R051 Acute cough: Secondary | ICD-10-CM | POA: Diagnosis not present

## 2023-12-24 DIAGNOSIS — J40 Bronchitis, not specified as acute or chronic: Secondary | ICD-10-CM | POA: Insufficient documentation

## 2023-12-24 LAB — POCT INFLUENZA A/B
Influenza A, POC: NEGATIVE
Influenza B, POC: NEGATIVE

## 2023-12-24 LAB — POC COVID19 BINAXNOW: SARS Coronavirus 2 Ag: NEGATIVE

## 2023-12-24 MED ORDER — AZITHROMYCIN 250 MG PO TABS
ORAL_TABLET | ORAL | 0 refills | Status: DC
Start: 2023-12-24 — End: 2024-07-28

## 2023-12-24 MED ORDER — GUAIFENESIN-CODEINE 100-10 MG/5ML PO SOLN: 5.0000 mL | Freq: Every evening | ORAL | 0 refills | Status: DC | PRN

## 2023-12-24 NOTE — Progress Notes (Signed)
 Assessment & Plan:  Bronchitis Assessment & Plan: Duration 4 days. No acute respiratory distress.  Afebrile.  Point-of-care flu, COVID are negative today.  Discussed more likely viral etiology.  Patient will start guaifenesin codeine syrup.  If no improvement in cough, advised her to go ahead and start azithromycin which have also sent in.  She will let me know how she is doing.  Orders: -     guaiFENesin-Codeine; Take 5 mLs by mouth at bedtime as needed for cough.  Dispense: 60 mL; Refill: 0 -     Azithromycin; Tale 500 mg PO on day 1, then 250 mg PO q24h x 4 days.  Dispense: 6 tablet; Refill: 0  Acute cough -     POC COVID-19 BinaxNow -     POCT Influenza A/B     Return precautions given.   Risks, benefits, and alternatives of the medications and treatment plan prescribed today were discussed, and patient expressed understanding.   Education regarding symptom management and diagnosis given to patient on AVS either electronically or printed.  No follow-ups on file.  Rennie Plowman, FNP  Subjective:    Patient ID: [name], female    DOB: December 23, 1955, 68 y.o.   MRN: [id number]  CC: [name] is a 68 y.o. female who presents today for an acute visit.    HPI: Complains of cough x 4 days, worsening  Endorses changes to taste, clear nasal congestion, clear eye drainage.   Cough is worse at night.   Denies F, wheezing, SOB, sinus pain, ear pain, sneezing  Tried benadryl , Claritin , robitussin,dayquil, nyquil, without relief.   H/o HTN, GERD      Never smoker No h/o ckd   Allergies: Patient has no known allergies. Current Outpatient Medications on File Prior to Visit  Medication Sig Dispense Refill   amLODipine (NORVASC) 5 MG tablet Take 1 tablet (5 mg total) by mouth daily. 90 tablet 3   Aspirin 81 MG CAPS Take by mouth daily at 8 pm.     atorvastatin (LIPITOR) 20 MG tablet Take 1 tablet (20 mg total) by mouth daily. 90 tablet 3   CALCIUM PO Take 1  tablet by mouth daily.     Cyanocobalamin (VITAMIN B 12 PO) Take 1 tablet by mouth daily at 6 (six) AM.     cyclobenzaprine (FLEXERIL) 5 MG tablet TAKE 1 TABLET (ORAL) 3 TIMES PER DAY FOR 10 DAYS TAKE AS NEEDED     famotidine (PEPCID) 20 MG tablet TAKE 1 TABLET BY MOUTH AT BEDTIME 90 tablet 3   gabapentin (NEURONTIN) 300 MG capsule Take 1 capsule (300 mg total) by mouth 3 (three) times daily. Begin taking only one capsule at night x 1 week. If tolerating well, you may take one in the morning and one at night continue this for one week before adding in a third dose. 90 capsule 2   levothyroxine (SYNTHROID) 200 MCG tablet Take 1 tablet (200 mcg total) by mouth daily. 90 tablet 0   meloxicam (MOBIC) 15 MG tablet Take 15 mg by mouth daily.     Multiple Vitamin (MULTIVITAMIN PO) Take 1 tablet by mouth daily at 6 (six) AM.     omeprazole (PRILOSEC) 40 MG capsule Take 1 capsule (40 mg total) by mouth daily before breakfast. 90 capsule 3   Probiotic Product (CULTURELLE PROBIOTICS PO) Take 1 tablet by mouth daily at 6 (six) AM.     triamterene-hydrochlorothiazide (MAXZIDE-25) 37.5-25 MG tablet Take 0.5 tablets  by mouth daily. 45 tablet 3   TURMERIC PO Take 2 capsules by mouth daily at 6 (six) AM.     VITAMIN D PO Take 1 tablet by mouth daily at 6 (six) AM.     [DISCONTINUED] traZODone (DESYREL) 50 MG tablet Take 0.5-1 tablets (25-50 mg total) by mouth at bedtime as needed for sleep. 90 tablet 3   No current facility-administered medications on file prior to visit.    Review of Systems  Constitutional:  Negative for chills and fever.  HENT:  Positive for congestion. Negative for ear pain, sinus pain and sore throat.   Respiratory:  Positive for cough. Negative for shortness of breath and wheezing.   Cardiovascular:  Negative for chest pain and palpitations.  Gastrointestinal:  Negative for nausea and vomiting.      Objective:    BP 136/70   Pulse 76   Temp (!) 97.1 F (36.2 C) (Oral)   Ht 5\' 5"   (1.651 m)   Wt 171 lb 6.4 oz (77.7 kg)   SpO2 98%   BMI 28.52 kg/m   BP Readings from Last 3 Encounters:  12/24/23 136/70  12/23/23 108/70  12/21/23 113/77   Wt Readings from Last 3 Encounters:  12/24/23 171 lb 6.4 oz (77.7 kg)  12/23/23 173 lb 3.2 oz (78.6 kg)  12/21/23 168 lb (76.2 kg)    Physical Exam Vitals reviewed.  Constitutional:      Appearance: She is well-developed.  HENT:     Head: Normocephalic and atraumatic.     Right Ear: Hearing, tympanic membrane, ear canal and external ear normal. No decreased hearing noted. No drainage, swelling or tenderness. No middle ear effusion. No foreign body. Tympanic membrane is not erythematous or bulging.     Left Ear: Hearing, tympanic membrane, ear canal and external ear normal. No decreased hearing noted. No drainage, swelling or tenderness.  No middle ear effusion. No foreign body. Tympanic membrane is not erythematous or bulging.     Nose: Nose normal. No rhinorrhea.     Right Sinus: No maxillary sinus tenderness or frontal sinus tenderness.     Left Sinus: No maxillary sinus tenderness or frontal sinus tenderness.     Mouth/Throat:     Pharynx: Uvula midline. No oropharyngeal exudate or posterior oropharyngeal erythema.     Tonsils: No tonsillar abscesses.  Eyes:     Conjunctiva/sclera: Conjunctivae normal.  Cardiovascular:     Rate and Rhythm: Regular rhythm.     Pulses: Normal pulses.     Heart sounds: Normal heart sounds.  Pulmonary:     Effort: Pulmonary effort is normal.     Breath sounds: Normal breath sounds. No wheezing, rhonchi or rales.  Lymphadenopathy:     Head:     Right side of head: No submental, submandibular, tonsillar, preauricular, posterior auricular or occipital adenopathy.     Left side of head: No submental, submandibular, tonsillar, preauricular, posterior auricular or occipital adenopathy.     Cervical: No cervical adenopathy.  Skin:    General: Skin is warm and dry.  Neurological:      Mental Status: She is alert.  Psychiatric:        Speech: Speech normal.        Behavior: Behavior normal.        Thought Content: Thought content normal.

## 2023-12-24 NOTE — Assessment & Plan Note (Signed)
 Duration 4 days. No acute respiratory distress.  Afebrile.  Point-of-care flu, COVID are negative today.  Discussed more likely viral etiology.  Patient will start guaifenesin codeine syrup.  If no improvement in cough, advised her to go ahead and start azithromycin which have also sent in.  She will let me know how she is doing.

## 2024-01-11 ENCOUNTER — Ambulatory Visit: Admitting: Neurology

## 2024-01-11 DIAGNOSIS — R29898 Other symptoms and signs involving the musculoskeletal system: Secondary | ICD-10-CM | POA: Diagnosis not present

## 2024-01-11 DIAGNOSIS — M5412 Radiculopathy, cervical region: Secondary | ICD-10-CM

## 2024-01-11 NOTE — Procedures (Signed)
  [facility]  [address], [address]  [place], [place] [address] Tel: [phone] Fax: [phone] Test Date:  01/11/2024  Patient: [name] DOB: 1956/03/29 Physician: Rommie Coats, MD  Sex: Female Height: 5\' 5"  Ref Phys: Ludwig Safer, PA-C  ID#: [id number]   Technician:    History: This is a 68 year old female with right upper limb numbness, tingling, and weakness.  NCV & EMG Findings: Extensive electrodiagnostic evaluation of the right upper limb shows: Right median, ulnar, and radial sensory responses are within normal limits. Right median (APB) and ulnar (ADM) motor responses are within normal limits. 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 Cervical paraspinal muscles were deferred due to history of prior cervical spine surgery.  Impression: This is an abnormal study. 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 No electrodiagnostic evidence of a right median mononeuropathy at or distal to the wrist, consistent with carpal tunnel syndrome. Screening studies for right ulnar or radial mononeuropathies are normal.    ___________________________ Rommie Coats, MD    Nerve Conduction Studies Motor Nerve Results    Latency Amplitude F-Lat Segment Distance CV Comment  Site (ms) Norm (mV) Norm (ms)  (cm) (m/s) Norm   Right Median (APB) Motor  Wrist 3.2  < 4.0 5.2  > 5.0        Elbow 8.0 - 4.5 -  Elbow-Wrist 27 56  > 50   Right Ulnar (ADM) Motor  Wrist 2.1  < 3.1 8.4  > 7.0        Bel elbow 5.2 - 8.2 -  Bel elbow-Wrist 20 65  > 50   Ab elbow 7.0 - 7.0 -  Ab elbow-Bel elbow 10 56 -    Sensory Sites    Neg Peak Lat Amplitude (O-P) Segment Distance Velocity Comment  Site (ms) Norm (V) Norm  (cm) (ms)   Right  Median Sensory  Wrist-Dig II 3.4  < 3.8 20  > 10 Wrist-Dig II 13    Right Radial Sensory  Forearm-Wrist 2.2  < 2.8 41  > 10 Forearm-Wrist 10    Right Ulnar Sensory  Wrist-Dig V 2.6  < 3.2 19  > 5 Wrist-Dig V 11     Electromyography   Side Muscle Ins.Act Fibs Fasc Recrt Amp Dur Poly Activation Comment  Right FDI Nml Nml Nml Nml Nml Nml Nml Nml N/A  Right EIP Nml Nml Nml Nml Nml Nml Nml Nml N/A  Right Pronator teres Nml *1+ Nml *3- *2+ *2+ *1+ Nml N/A  Right Biceps Nml Nml Nml Nml Nml Nml Nml Nml N/A  Right Triceps Nml Nml Nml Nml Nml Nml Nml Nml N/A  Right Deltoid Nml Nml Nml Nml Nml Nml Nml Nml N/A  Right Brachiorad Nml *1+ Nml *2- *1+ *1+ *1+ Nml N/A      Waveforms:  Motor      Sensory

## 2024-01-13 ENCOUNTER — Other Ambulatory Visit: Payer: Self-pay | Admitting: Physician Assistant

## 2024-01-13 DIAGNOSIS — M5412 Radiculopathy, cervical region: Secondary | ICD-10-CM

## 2024-01-13 DIAGNOSIS — Q761 Klippel-Feil syndrome: Secondary | ICD-10-CM

## 2024-01-13 DIAGNOSIS — Z981 Arthrodesis status: Secondary | ICD-10-CM

## 2024-01-13 DIAGNOSIS — M542 Cervicalgia: Secondary | ICD-10-CM

## 2024-01-13 DIAGNOSIS — M4802 Spinal stenosis, cervical region: Secondary | ICD-10-CM

## 2024-01-19 ENCOUNTER — Ambulatory Visit (INDEPENDENT_AMBULATORY_CARE_PROVIDER_SITE_OTHER): Admitting: Nurse Practitioner

## 2024-01-19 ENCOUNTER — Encounter: Payer: Self-pay | Admitting: Nurse Practitioner

## 2024-01-19 VITALS — BP 100/70 | HR 78 | Temp 98.3°F | Ht 65.0 in | Wt 166.8 lb

## 2024-01-19 DIAGNOSIS — E039 Hypothyroidism, unspecified: Secondary | ICD-10-CM | POA: Diagnosis not present

## 2024-01-19 DIAGNOSIS — H9311 Tinnitus, right ear: Secondary | ICD-10-CM | POA: Diagnosis not present

## 2024-01-19 DIAGNOSIS — E663 Overweight: Secondary | ICD-10-CM

## 2024-01-19 DIAGNOSIS — M4802 Spinal stenosis, cervical region: Secondary | ICD-10-CM | POA: Diagnosis not present

## 2024-01-19 DIAGNOSIS — I10 Essential (primary) hypertension: Secondary | ICD-10-CM | POA: Diagnosis not present

## 2024-01-19 LAB — IBC + FERRITIN
Ferritin: 23.5 ng/mL (ref 10.0–291.0)
Iron: 105 ug/dL (ref 42–145)
Saturation Ratios: 24.8 % (ref 20.0–50.0)
TIBC: 422.8 ug/dL (ref 250.0–450.0)
Transferrin: 302 mg/dL (ref 212.0–360.0)

## 2024-01-19 LAB — COMPREHENSIVE METABOLIC PANEL WITH GFR
ALT: 21 U/L (ref 0–35)
AST: 24 U/L (ref 0–37)
Albumin: 4.4 g/dL (ref 3.5–5.2)
Alkaline Phosphatase: 44 U/L (ref 39–117)
BUN: 19 mg/dL (ref 6–23)
CO2: 30 meq/L (ref 19–32)
Calcium: 9.8 mg/dL (ref 8.4–10.5)
Chloride: 105 meq/L (ref 96–112)
Creatinine, Ser: 0.76 mg/dL (ref 0.40–1.20)
GFR: 81.94 mL/min (ref >60.00)
Glucose, Bld: 93 mg/dL (ref 70–99)
Potassium: 4.1 meq/L (ref 3.5–5.1)
Sodium: 139 meq/L (ref 135–145)
Total Bilirubin: 0.5 mg/dL (ref 0.2–1.2)
Total Protein: 7.3 g/dL (ref 6.0–8.3)

## 2024-01-19 LAB — CBC WITH DIFFERENTIAL/PLATELET
Basophils Absolute: 0 10*3/uL (ref 0.0–0.1)
Basophils Relative: 0.4 % (ref 0.0–3.0)
Eosinophils Absolute: 0.2 10*3/uL (ref 0.0–0.7)
Eosinophils Relative: 4.2 % (ref 0.0–5.0)
HCT: 38.6 % (ref 36.0–46.0)
Hemoglobin: 12.9 g/dL (ref 12.0–15.0)
Lymphocytes Relative: 29.2 % (ref 12.0–46.0)
Lymphs Abs: 1.2 10*3/uL (ref 0.7–4.0)
MCHC: 33.3 g/dL (ref 30.0–36.0)
MCV: 87.1 fl (ref 78.0–100.0)
Monocytes Absolute: 0.5 10*3/uL (ref 0.1–1.0)
Monocytes Relative: 10.8 % (ref 3.0–12.0)
Neutro Abs: 2.4 10*3/uL (ref 1.4–7.7)
Neutrophils Relative %: 55.4 % (ref 43.0–77.0)
Platelets: 236 10*3/uL (ref 150.0–400.0)
RBC: 4.44 Mil/uL (ref 3.87–5.11)
RDW: 13.3 % (ref 11.5–15.5)
WBC: 4.3 10*3/uL (ref 4.0–10.5)

## 2024-01-19 LAB — TSH: TSH: 0.68 u[IU]/mL (ref 0.35–5.50)

## 2024-01-19 LAB — VITAMIN B12: Vitamin B-12: 1360 pg/mL — ABNORMAL HIGH (ref 211–911)

## 2024-01-19 NOTE — Assessment & Plan Note (Signed)
 She experiences right arm weakness and difficulty with tasks. MRI and CT scan are scheduled to investigate further. Follow up with Neurosurgery as scheduled.

## 2024-01-19 NOTE — Assessment & Plan Note (Signed)
 Trying to lose weight. She has recently lost 7 pounds with decreased portion sizes and increased physical activity. Continue. Encourage healthy diet and regular exercise.

## 2024-01-19 NOTE — Progress Notes (Signed)
 Jasmine Burkitt, NP-C Phone: [phone]  [name] is a 68 y.o. female who presents today for follow up.   Discussed the use of AI scribe software for clinical note transcription with the patient, who gave verbal consent to proceed.  History of Present Illness   [name] is a 68 year old female who presents with ringing in the right ear and weakness in the right arm.  Ringing in the right ear has been present for the past two days. Initially, the sensation affected both ears but is now primarily in the right ear. She uses Q-tips in her ears and wonders if her earplugs or exposure to loud music could be contributing factors. No infection or visible issues with her ears.  Weakness in the right arm has been progressively noticeable over the past week, particularly when trying to open bottles or perform tasks requiring grip strength. She is scheduled for an MRI and CT scan tomorrow to further evaluate this issue. She has been seeing neurosurgery for this.   In terms of lifestyle, she has been focusing on portion control and has resumed walking, contributing to a weight loss of seven pounds. She continues to drink alcohol in moderation on Saturdays. 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 She takes a B12 supplement every morning along with her thyroid  medication, vitamin D , a probiotic, and her blood pressure medication.      Social History   Tobacco Use  Smoking Status Never  Smokeless Tobacco Never    Current Outpatient Medications on File Prior to Visit  Medication Sig Dispense Refill   amLODipine  (NORVASC ) 5 MG tablet Take 1 tablet (5 mg total) by mouth daily. 90 tablet 3   Aspirin  81 MG CAPS Take by mouth daily at 8 pm.     atorvastatin  (LIPITOR) 20 MG tablet Take 1 tablet (20 mg total) by mouth daily. 90 tablet 3   azithromycin  (ZITHROMAX ) 250 MG tablet Tale 500 mg PO on day 1, then 250 mg PO q24h x 4 days. 6 tablet  0   CALCIUM  PO Take 1 tablet by mouth daily.     Cyanocobalamin  (VITAMIN B 12 PO) Take 1 tablet by mouth daily at 6 (six) AM.     cyclobenzaprine  (FLEXERIL ) 5 MG tablet TAKE 1 TABLET (ORAL) 3 TIMES PER DAY FOR 10 DAYS TAKE AS NEEDED     famotidine  (PEPCID ) 20 MG tablet TAKE 1 TABLET BY MOUTH AT BEDTIME 90 tablet 3   gabapentin  (NEURONTIN ) 300 MG capsule Take 1 capsule (300 mg total) by mouth 3 (three) times daily. Begin taking only one capsule at night x 1 week. If tolerating well, you may take one in the morning and one at night continue this for one week before adding in a third dose. 90 capsule 2   guaiFENesin -codeine  100-10 MG/5ML syrup Take 5 mLs by mouth at bedtime as needed for cough. 60 mL 0   levothyroxine  (SYNTHROID ) 200 MCG tablet Take 1 tablet (200 mcg total) by mouth daily. 90 tablet 0   meloxicam  (MOBIC ) 15 MG tablet Take 15 mg by mouth daily.     Multiple Vitamin (MULTIVITAMIN PO) Take 1 tablet by mouth daily at 6 (six) AM.     omeprazole  (PRILOSEC) 40 MG capsule Take 1 capsule (40 mg total) by mouth daily before breakfast. 90 capsule 3   Probiotic Product (CULTURELLE PROBIOTICS PO) Take 1 tablet by mouth daily at 6 (six)  AM.     triamterene -hydrochlorothiazide  (MAXZIDE-25) 37.5-25 MG tablet Take 0.5 tablets by mouth daily. 45 tablet 3   TURMERIC PO Take 2 capsules by mouth daily at 6 (six) AM.     VITAMIN D  PO Take 1 tablet by mouth daily at 6 (six) AM.     [DISCONTINUED] traZODone  (DESYREL ) 50 MG tablet Take 0.5-1 tablets (25-50 mg total) by mouth at bedtime as needed for sleep. 90 tablet 3   No current facility-administered medications on file prior to visit.     ROS see history of present illness  Objective  Physical Exam Vitals:   01/19/24 1031  BP: 100/70  Pulse: 78  Temp: 98.3 F (36.8 C)  SpO2: 98%    BP Readings from Last 3 Encounters:  01/19/24 100/70  12/24/23 136/70  12/23/23 108/70   Wt Readings from Last 3 Encounters:  01/19/24 166 lb 12.8 oz  (75.7 kg)  12/24/23 171 lb 6.4 oz (77.7 kg)  12/23/23 173 lb 3.2 oz (78.6 kg)    Physical Exam Constitutional:      General: She is not in acute distress.    Appearance: Normal appearance.  HENT:     Head: Normocephalic.     Right Ear: Tympanic membrane normal.     Left Ear: Tympanic membrane normal.  Cardiovascular:     Rate and Rhythm: Normal rate and regular rhythm.     Heart sounds: Normal heart sounds.  Pulmonary:     Effort: Pulmonary effort is normal.     Breath sounds: Normal breath sounds.  Skin:    General: Skin is warm and dry.  Neurological:     General: No focal deficit present.     Mental Status: She is alert.  Psychiatric:        Mood and Affect: Mood normal.        Behavior: Behavior normal.      Assessment/Plan: Please see individual problem list.  Tinnitus of right ear Assessment & Plan: Ringing in the right ear may be due to allergies. B12, thyroid , and medication are potential factors, and earplug use may contribute. Recommend a trial of nasal spray for possible allergy-related congestion and order labs to check thyroid  function and B12 levels.   Orders: -     CBC with Differential/Platelet -     IBC + Ferritin -     Vitamin B12  Spinal stenosis in cervical region Assessment & Plan: She experiences right arm weakness and difficulty with tasks. MRI and CT scan are scheduled to investigate further. Follow up with Neurosurgery as scheduled.    Overweight (BMI 25.0-29.9) Assessment & Plan: Trying to lose weight. She has recently lost 7 pounds with decreased portion sizes and increased physical activity. Continue. Encourage healthy diet and regular exercise.    Hypothyroidism, unspecified type -     TSH  Essential hypertension -     Comprehensive metabolic panel with GFR    Return in about 6 months (around 07/20/2024) for Follow up.   Jasmine Burkitt, NP-C [facility]

## 2024-01-19 NOTE — Assessment & Plan Note (Signed)
 Ringing in the right ear may be due to allergies. B12, thyroid , and medication are potential factors, and earplug use may contribute. Recommend a trial of nasal spray for possible allergy-related congestion and order labs to check thyroid  function and B12 levels.

## 2024-01-20 ENCOUNTER — Ambulatory Visit: Admitting: Student in an Organized Health Care Education/Training Program

## 2024-01-20 ENCOUNTER — Ambulatory Visit
Admission: RE | Admit: 2024-01-20 | Discharge: 2024-01-20 | Disposition: A | Discharge status: Home / Self Care | Source: Ambulatory Visit | Attending: Physician Assistant | Admitting: Physician Assistant

## 2024-01-20 DIAGNOSIS — M4802 Spinal stenosis, cervical region: Secondary | ICD-10-CM

## 2024-01-20 DIAGNOSIS — Z981 Arthrodesis status: Secondary | ICD-10-CM

## 2024-01-20 DIAGNOSIS — M542 Cervicalgia: Secondary | ICD-10-CM | POA: Insufficient documentation

## 2024-01-20 DIAGNOSIS — M47812 Spondylosis without myelopathy or radiculopathy, cervical region: Secondary | ICD-10-CM | POA: Diagnosis not present

## 2024-01-20 DIAGNOSIS — Q761 Klippel-Feil syndrome: Secondary | ICD-10-CM | POA: Diagnosis not present

## 2024-01-20 DIAGNOSIS — Z4789 Encounter for other orthopedic aftercare: Secondary | ICD-10-CM | POA: Diagnosis not present

## 2024-01-20 DIAGNOSIS — M5020 Other cervical disc displacement, unspecified cervical region: Secondary | ICD-10-CM | POA: Diagnosis not present

## 2024-01-20 DIAGNOSIS — M5412 Radiculopathy, cervical region: Secondary | ICD-10-CM | POA: Insufficient documentation

## 2024-01-20 DIAGNOSIS — R29898 Other symptoms and signs involving the musculoskeletal system: Secondary | ICD-10-CM | POA: Diagnosis not present

## 2024-01-21 ENCOUNTER — Ambulatory Visit: Admitting: Student in an Organized Health Care Education/Training Program

## 2024-01-27 DIAGNOSIS — Z8673 Personal history of transient ischemic attack (TIA), and cerebral infarction without residual deficits: Secondary | ICD-10-CM | POA: Diagnosis not present

## 2024-01-27 DIAGNOSIS — M19011 Primary osteoarthritis, right shoulder: Secondary | ICD-10-CM | POA: Diagnosis not present

## 2024-01-27 DIAGNOSIS — G542 Cervical root disorders, not elsewhere classified: Secondary | ICD-10-CM | POA: Diagnosis not present

## 2024-01-27 DIAGNOSIS — R4189 Other symptoms and signs involving cognitive functions and awareness: Secondary | ICD-10-CM | POA: Diagnosis not present

## 2024-02-01 DIAGNOSIS — F320 Major depressive disorder, single episode, mild: Secondary | ICD-10-CM | POA: Diagnosis not present

## 2024-02-03 ENCOUNTER — Other Ambulatory Visit: Payer: Self-pay | Admitting: Nurse Practitioner

## 2024-02-03 DIAGNOSIS — Z1231 Encounter for screening mammogram for malignant neoplasm of breast: Secondary | ICD-10-CM

## 2024-02-09 ENCOUNTER — Encounter: Payer: Self-pay | Admitting: Student in an Organized Health Care Education/Training Program

## 2024-02-09 ENCOUNTER — Ambulatory Visit
Attending: Student in an Organized Health Care Education/Training Program | Admitting: Student in an Organized Health Care Education/Training Program

## 2024-02-09 ENCOUNTER — Telehealth: Payer: Self-pay

## 2024-02-09 VITALS — BP 113/77 | HR 73 | Temp 97.4°F | Resp 16 | Ht 64.0 in | Wt 166.0 lb

## 2024-02-09 DIAGNOSIS — G894 Chronic pain syndrome: Secondary | ICD-10-CM | POA: Diagnosis not present

## 2024-02-09 DIAGNOSIS — M25511 Pain in right shoulder: Secondary | ICD-10-CM | POA: Diagnosis not present

## 2024-02-09 DIAGNOSIS — G5681 Other specified mononeuropathies of right upper limb: Secondary | ICD-10-CM | POA: Insufficient documentation

## 2024-02-09 DIAGNOSIS — G8929 Other chronic pain: Secondary | ICD-10-CM | POA: Diagnosis not present

## 2024-02-09 DIAGNOSIS — M7581 Other shoulder lesions, right shoulder: Secondary | ICD-10-CM | POA: Insufficient documentation

## 2024-02-09 DIAGNOSIS — M19111 Post-traumatic osteoarthritis, right shoulder: Secondary | ICD-10-CM | POA: Diagnosis not present

## 2024-02-09 DIAGNOSIS — S46001S Unspecified injury of muscle(s) and tendon(s) of the rotator cuff of right shoulder, sequela: Secondary | ICD-10-CM | POA: Insufficient documentation

## 2024-02-09 NOTE — Patient Instructions (Signed)

## 2024-02-09 NOTE — Progress Notes (Signed)
 Safety precautions to be maintained throughout the outpatient stay will include: orient to surroundings, keep bed in low position, maintain call bell within reach at all times, provide assistance with transfer out of bed and ambulation.

## 2024-02-09 NOTE — Progress Notes (Signed)
 PROVIDER NOTE: Interpretation of information contained herein should be left to medically-trained personnel. Specific patient instructions are provided elsewhere under "Patient Instructions" section of medical record. This document was created in part using AI and STT-dictation technology, any transcriptional errors that may result from this process are unintentional.  Patient: [name]  Service: E/M   PCP: Bluford Burkitt, NP  DOB: Oct 10, 1955  DOS: 02/09/2024  Provider: Cephus Collin, MD  MRN: [id number]  Delivery: Face-to-face  Specialty: Interventional Pain Management  Type: Established Patient  Setting: Ambulatory outpatient facility  Specialty designation: 09  Referring Prov.: Bluford Burkitt, NP  Location: Outpatient office facility       HPI  [name], a 68 y.o. 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 [name] primary complain today is Shoulder Pain (Right )  Pain Assessment: Severity of Chronic pain is reported as a 8 /10. Location: Shoulder Right/From right shoulder to right elbow and mid forearm. Causing weakness in right hand.. Onset: More than a month ago. Quality: Aching, Constant, Discomfort, Shooting, Sore, Restless. Timing: Constant. Modifying factor(s): Denies. Vitals:  height is 5\' 4"  (1.626 m) and weight is 166 lb (75.3 kg). Her temporal temperature is 97.4 F (36.3 C) (abnormal). Her blood pressure is 113/77 and her pulse is 73. Her respiration is 16 and oxygen saturation is 100%.  BMI: Estimated body mass index is 28.49 kg/m as calculated from the following:   Height as of this encounter: 5\' 4"  (1.626 m).   Weight as of this encounter: 166 lb (75.3 kg). Last encounter: 11/24/2023. Last procedure: 12/21/2023.  Reason for encounter:    Post-procedure evaluation    Procedure: Glenohumeral Joint (shoulder) Injection #1  Laterality: Right (-RT)  Level: Shoulder   Imaging: Fluoroscopy-guided         Anesthesia:  Local anesthesia (1-2% Lidocaine ) DOS: 12/21/2023  Performed by: Cephus Collin, MD  Purpose: Diagnostic/Therapeutic Indications: Shoulder pain severe enough to impact quality of life or function. Rationale (medical necessity): procedure needed and proper for the diagnosis and/or treatment of [name]'s medical symptoms and needs. 1. 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 Chronic right shoulder pain   3. Chronic pain syndrome    NAS-11 Pain score:   Pre-procedure: 8 /10   Post-procedure: 0-No pain/10     Effectiveness:  Initial hour after procedure: 100 %  Subsequent 4-6 hours post-procedure: 100 %  Analgesia past initial 6 hours: 0 % (20% releif for 2 weeks)  Ongoing improvement:  Analgesic:  back to baseline    ROS  Constitutional: Denies any fever or chills Gastrointestinal: No reported hemesis, hematochezia, vomiting, or acute GI distress Musculoskeletal: right shoulder and neck pain Neurological: No reported episodes of acute onset apraxia, aphasia, dysarthria, agnosia, amnesia, paralysis, loss of coordination, or loss of consciousness  Medication Review  Aspirin , Calcium , Cyanocobalamin , Multiple Vitamin, Probiotic Product, Turmeric, Vitamin D , amLODipine , atorvastatin , azithromycin , cyclobenzaprine , famotidine , gabapentin , guaiFENesin -codeine , levothyroxine , meloxicam , omeprazole , traZODone , and triamterene -hydrochlorothiazide   History Review  Allergy: [name] has no known allergies. Drug: [name]  reports no history of drug use. Alcohol:  reports current alcohol use of about 3.0 standard drinks of alcohol per week. Tobacco:  reports that she has never smoked. She has never used smokeless tobacco. Social: [name]  reports that she has never smoked. She has never used smokeless tobacco. She reports current alcohol use of about 3.0 standard drinks of alcohol per week. She reports  that she does not use drugs. Medical:  has a past medical  history of Anemia, Anxiety, Arthritis, Bronchitis, COVID-19 (07/08/2019), Depression, GERD (gastroesophageal reflux disease), Hypertension, Hypothyroidism, Mini stroke, Osteoporosis, PTSD (post-traumatic stress disorder), Stroke (HCC) (2023), and Wears glasses. Surgical: [name]  has a past surgical history that includes Back surgery; Breast surgery; Augmentation mammaplasty (Bilateral, 2006); Esophagogastroduodenoscopy (egd) with propofol  (N/A, 10/24/2020); Esophagogastroduodenoscopy (egd) with propofol  (N/A, 12/20/2020); Esophageal manometry (N/A, 03/04/2021); 24 hour ph study (N/A, 03/04/2021); PH impedance study (N/A, 03/04/2021); Vein Surgery (1991); Upper gastrointestinal endoscopy; Colonoscopy; Anterior cervical decomp/discectomy fusion (N/A, 05/12/2022); and Esophagogastroduodenoscopy (egd) with propofol  (N/A, 11/11/2022). Family: family history includes Cancer in her father and sister; Colon cancer in her sister; Dementia in her mother; Diabetes in her father; Diabetes Mellitus I in her son; Heart Problems in her father; Heart disease in her father, mother, sister, and sister; Hypertension in her mother; Lung cancer in her maternal uncle; Osteoporosis in her mother; Ovarian cancer in her sister; Sickle cell trait in her daughter; Skin cancer in an other family member; Vitiligo in her mother and son.  Laboratory Chemistry Profile   Renal Lab Results  Component Value Date   BUN 19 01/19/2024   CREATININE 0.76 01/19/2024   GFR 81.94 01/19/2024   GFRAA >60 10/04/2019   GFRNONAA >60 11/14/2022    Hepatic Lab Results  Component Value Date   AST 24 01/19/2024   ALT 21 01/19/2024   ALBUMIN 4.4 01/19/2024   ALKPHOS 44 01/19/2024   LIPASE 56 (H) 11/14/2022    Electrolytes Lab Results  Component Value Date   NA 139 01/19/2024   K 4.1 01/19/2024   CL 105 01/19/2024   CALCIUM  9.8 01/19/2024    Bone Lab Results  Component Value Date   VD25OH 73.7 08/11/2023    Inflammation (CRP: Acute  Phase) (ESR: Chronic Phase) No results found for: "CRP", "ESRSEDRATE", "LATICACIDVEN"       Note: Above Lab results reviewed.  Recent Imaging Review  NCV with EMG(electromyography) Ellene Gustin, MD     01/11/2024  1:39 PM  [facility]  [address], [address]  [place], [place] [address] Tel: [phone] Fax: [phone] Test Date:  01/11/2024  Patient: [name] DOB: [birth year] Physician: Rommie Coats, MD  Sex: Female Height: 5\' 5"  Ref Phys: Ludwig Safer, PA-C  ID#: [id number]   Technician:    History: This is a 68 year old female with right upper limb numbness,  tingling, and weakness.  NCV & EMG Findings: Extensive electrodiagnostic evaluation of the right upper limb  shows: Right median, ulnar, and radial sensory responses are within  normal limits. Right median (APB) and ulnar (ADM) motor responses are within  normal limits. Chronic motor axon loss changes with accompanying active  denervation changes are seen in the right pronator teres and  brachioradialis muscles. Cervical paraspinal muscles were  deferred due to history of prior cervical spine surgery.  Impression: This is an abnormal study. The findings are most consistent with  the following: An active/ongoing intraspinal canal lesion (ie; motor  radiculopathy) at the right C6 root or segment, moderate in  degree electrically. No electrodiagnostic evidence of a right median mononeuropathy at  or distal to the wrist, consistent with carpal tunnel syndrome. Screening studies for right ulnar or radial mononeuropathies are  normal.  ___________________________ Rommie Coats, MD  Nerve Conduction Studies Motor Nerve Results    Latency Amplitude F-Lat Segment Distance CV Comment  Site (ms) Norm (mV) Norm (ms)  (  cm) (m/s) Norm   Right Median (APB) Motor  Wrist 3.2  < 4.0 5.2  > 5.0        Elbow 8.0 - 4.5 -  Elbow-Wrist 27 56  > 50   Right Ulnar (ADM) Motor  Wrist 2.1  < 3.1 8.4  > 7.0         Bel elbow 5.2 - 8.2 -  Bel elbow-Wrist 20 65  > 50   Ab elbow 7.0 - 7.0 -  Ab elbow-Bel elbow 10 56 -    Sensory Sites    Neg Peak Lat Amplitude (O-P) Segment Distance Velocity Comment  Site (ms) Norm (V) Norm  (cm) (ms)   Right Median Sensory  Wrist-Dig II 3.4  < 3.8 20  > 10 Wrist-Dig II 13    Right Radial Sensory  Forearm-Wrist 2.2  < 2.8 41  > 10 Forearm-Wrist 10    Right Ulnar Sensory  Wrist-Dig V 2.6  < 3.2 19  > 5 Wrist-Dig V 11     Electromyography   Side Muscle Ins.Act Fibs Fasc Recrt Amp Dur Poly Activation  Comment  Right FDI Nml Nml Nml Nml Nml Nml Nml Nml N/A  Right EIP Nml Nml Nml Nml Nml Nml Nml Nml N/A  Right Pronator teres Nml *1+ Nml *3- *2+ *2+ *1+ Nml N/A  Right Biceps Nml Nml Nml Nml Nml Nml Nml Nml N/A  Right Triceps Nml Nml Nml Nml Nml Nml Nml Nml N/A  Right Deltoid Nml Nml Nml Nml Nml Nml Nml Nml N/A  Right Brachiorad Nml *1+ Nml *2- *1+ *1+ *1+ Nml N/A   Waveforms:  Motor      Sensory       Note: Reviewed         Physical Exam  General appearance: Well nourished, well developed, and well hydrated. In no apparent acute distress Mental status: Alert, oriented x 3 (person, place, & time)       Respiratory: No evidence of acute respiratory distress Eyes: PERLA Vitals: BP 113/77 (Patient Position: Sitting, Cuff Size: Normal)   Pulse 73   Temp (!) 97.4 F (36.3 C) (Temporal)   Resp 16   Ht 5\' 4"  (1.626 m)   Wt 166 lb (75.3 kg)   SpO2 100%   BMI 28.49 kg/m  BMI: Estimated body mass index is 28.49 kg/m as calculated from the following:   Height as of this encounter: 5\' 4"  (1.626 m).   Weight as of this encounter: 166 lb (75.3 kg). Ideal: Ideal body weight: 54.7 kg (120 lb 9.5 oz) Adjusted ideal body weight: 62.9 kg (138 lb 12.1 oz)  Right shoulder and neck pain  Assessment   Diagnosis  1. Suprascapular nerve entrapment, right   2. 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 Chronic right shoulder pain   4.  Right rotator cuff tendonitis   5. Chronic pain syndrome      Updated Problems: No problems updated.  Plan of Care  1. Suprascapular nerve entrapment, right (Primary) - SUPRASCAPULAR NERVE BLOCK; Future  2. 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 Chronic right shoulder pain - SUPRASCAPULAR NERVE BLOCK; Future  4. Right rotator cuff tendonitis - SUPRASCAPULAR NERVE BLOCK; Future  5. Chronic pain syndrome - SUPRASCAPULAR NERVE BLOCK; Future  We are still awaiting her imaging results, she had an MRI of her cervical spine and her CT of her cervical spine done  01/20/2024 however no results have been finalized.  We will contact radiology to have them prioritized this read. Unfortunately her glenohumeral joint steroid injection was not helpful.  We discussed a right suprascapular nerve block to help target her right shoulder and referred neck pain. She had a nerve conduction velocity/EMG study that shows right C6 motor radiculopathy.   Orders:  Orders Placed This Encounter  Procedures   SUPRASCAPULAR NERVE BLOCK    For shoulder pain.    Standing Status:   Future    Expiration Date:   05/11/2024    Scheduling Instructions:     Purpose: RIGHT     Level(s): Suprascapular notch     Sedation: without     Scheduling Timeframe: As permitted by the schedule    Where will this procedure be performed?:   [facility]   Follow-up plan:   Return in about 1 day (around 02/10/2024) for Right SSNB , in clinic [facility].     Right SI-J and Right Piriformis 04/17/21, 09/02/21. 11/12/22, RIGHT LFCN 06/17/23  Right glenohumeral joint injection 12/21/2023     Recent Visits Date Type Provider Dept  12/21/23 Procedure visit Cephus Collin, MD [facility]  11/24/23 Office Visit Cephus Collin, MD [facility]  Showing recent visits within past 90 days and meeting all other requirements Today's Visits Date Type Provider  Dept  02/09/24 Office Visit Cephus Collin, MD [facility]  Showing today's visits and meeting all other requirements Future Appointments No visits were found meeting these conditions. Showing future appointments within next 90 days and meeting all other requirements   I discussed the assessment and treatment plan with the patient. The patient was provided an opportunity to ask questions and all were answered. The patient agreed with the plan and demonstrated an understanding of the instructions.  Patient advised to call back or seek an in-person evaluation if the symptoms or condition worsens.  Duration of encounter: .  Total time on encounter, as per AMA guidelines included both the face-to-face and non-face-to-face time personally spent by the physician and/or other qualified health care professional(s) on the day of the encounter (includes time in activities that require the physician or other qualified health care professional and does not include time in activities normally performed by clinical staff). Physician's time may include the following activities when performed: Preparing to see the patient (e.g., pre-charting review of records, searching for previously ordered imaging, lab work, and nerve conduction tests) Review of prior analgesic pharmacotherapies. Reviewing PMP Interpreting ordered tests (e.g., lab work, imaging, nerve conduction tests) Performing post-procedure evaluations, including interpretation of diagnostic procedures Obtaining and/or reviewing separately obtained history Performing a medically appropriate examination and/or evaluation Counseling and educating the patient/family/caregiver Ordering medications, tests, or procedures Referring and communicating with other health care professionals (when not separately reported) Documenting clinical information in the electronic or other health record Independently interpreting results (not separately  reported) and communicating results to the patient/ family/caregiver Care coordination (not separately reported)  Note by: Cephus Collin, MD (TTS and AI technology used. I apologize for any typographical errors that were not detected and corrected.) Date: 02/09/2024; Time: 11:52 AM

## 2024-02-09 NOTE — Telephone Encounter (Signed)
 Call to radiology reading room (spoke to MJ) to expedite results for MRI and CT completed on 01/20/24 ordered by Ludwig Safer, PA-C as requested by Dr. Rhesa Celeste.

## 2024-02-10 ENCOUNTER — Ambulatory Visit
Admission: RE | Admit: 2024-02-10 | Discharge: 2024-02-10 | Disposition: A | Discharge status: Home / Self Care | Source: Ambulatory Visit | Attending: Student in an Organized Health Care Education/Training Program | Admitting: Student in an Organized Health Care Education/Training Program

## 2024-02-10 ENCOUNTER — Ambulatory Visit (HOSPITAL_BASED_OUTPATIENT_CLINIC_OR_DEPARTMENT_OTHER): Admitting: Student in an Organized Health Care Education/Training Program

## 2024-02-10 ENCOUNTER — Other Ambulatory Visit: Payer: Self-pay | Admitting: Student in an Organized Health Care Education/Training Program

## 2024-02-10 ENCOUNTER — Encounter: Payer: Self-pay | Admitting: Student in an Organized Health Care Education/Training Program

## 2024-02-10 VITALS — BP 124/96 | HR 80 | Temp 97.5°F | Resp 18 | Ht 64.0 in | Wt 166.0 lb

## 2024-02-10 DIAGNOSIS — S46001S Unspecified injury of muscle(s) and tendon(s) of the rotator cuff of right shoulder, sequela: Secondary | ICD-10-CM

## 2024-02-10 DIAGNOSIS — M25511 Pain in right shoulder: Secondary | ICD-10-CM | POA: Insufficient documentation

## 2024-02-10 DIAGNOSIS — G5681 Other specified mononeuropathies of right upper limb: Secondary | ICD-10-CM

## 2024-02-10 DIAGNOSIS — G8929 Other chronic pain: Secondary | ICD-10-CM

## 2024-02-10 DIAGNOSIS — G894 Chronic pain syndrome: Secondary | ICD-10-CM | POA: Insufficient documentation

## 2024-02-10 DIAGNOSIS — M19111 Post-traumatic osteoarthritis, right shoulder: Secondary | ICD-10-CM

## 2024-02-10 MED ORDER — ROPIVACAINE HCL 2 MG/ML IJ SOLN
4.0000 mL | Freq: Once | INTRAMUSCULAR | Status: AC
  Administered 2024-02-10: 4 mL via INTRA_ARTICULAR

## 2024-02-10 MED ORDER — IOHEXOL 180 MG/ML  SOLN
INTRAMUSCULAR | Status: AC
  Filled 2024-02-10: qty 20

## 2024-02-10 MED ORDER — DEXAMETHASONE SODIUM PHOSPHATE 10 MG/ML IJ SOLN
INTRAMUSCULAR | Status: AC
  Filled 2024-02-10: qty 1

## 2024-02-10 MED ORDER — LIDOCAINE HCL (PF) 2 % IJ SOLN
INTRAMUSCULAR | Status: AC
  Filled 2024-02-10: qty 5

## 2024-02-10 MED ORDER — IOHEXOL 180 MG/ML  SOLN
10.0000 mL | Freq: Once | INTRAMUSCULAR | Status: AC
  Administered 2024-02-10: 10 mL via INTRA_ARTICULAR

## 2024-02-10 MED ORDER — DEXAMETHASONE SODIUM PHOSPHATE 10 MG/ML IJ SOLN
10.0000 mg | Freq: Once | INTRAMUSCULAR | Status: AC
  Administered 2024-02-10: 10 mg

## 2024-02-10 MED ORDER — ROPIVACAINE HCL 2 MG/ML IJ SOLN
INTRAMUSCULAR | Status: AC
  Filled 2024-02-10: qty 20

## 2024-02-10 MED ORDER — LIDOCAINE HCL 2 % IJ SOLN
20.0000 mL | Freq: Once | INTRAMUSCULAR | Status: AC
  Administered 2024-02-10: 100 mg

## 2024-02-10 NOTE — Patient Instructions (Signed)

## 2024-02-10 NOTE — Progress Notes (Signed)
 Safety precautions to be maintained throughout the outpatient stay will include: orient to surroundings, keep bed in low position, maintain call bell within reach at all times, provide assistance with transfer out of bed and ambulation.

## 2024-02-10 NOTE — Progress Notes (Signed)
 PROVIDER NOTE: Interpretation of information contained herein should be left to medically-trained personnel. Specific patient instructions are provided elsewhere under "Patient Instructions" section of medical record. This document was created in part using STT-dictation technology, any transcriptional errors that may result from this process are unintentional.  Patient: [name] Type: Established DOB: May 12, 1956 MRN: [id number] PCP: Bluford Burkitt, NP  Service: Procedure DOS: 02/10/2024 Setting: Ambulatory Location: Ambulatory outpatient facility Delivery: Face-to-face Provider: Cephus Collin, MD Specialty: Interventional Pain Management Specialty designation: 09 Location: Outpatient facility Ref. Prov.: Bluford Burkitt, NP       Interventional Therapy   Procedure: Suprascapular nerve block (SSNB) #1  Laterality:  Right  Level: Superior to scapular spine, lateral to supraspinatus fossa (Suprascapular notch).  Imaging: Fluoroscopic guidance         Anesthesia: Local anesthesia (1-2% Lidocaine ) DOS: 02/10/2024  Performed by: Cephus Collin, MD  Purpose: Diagnostic/Therapeutic Indications: Shoulder pain, severe enough to impact quality of life and/or function. 1. Suprascapular nerve entrapment, right   2. 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 Chronic right shoulder pain    NAS-11 score:   Pre-procedure: 8 /10   Post-procedure: 5 /10     Target: Suprascapular nerve Location: midway between the medial border of the scapula and the acromion as it runs through the suprascapular notch. Region: Suprascapular, posterior shoulder  Approach: Percutaneous  Neuroanatomy: The suprascapular nerve is the lateral branch of the superior trunk of the brachial plexus. It receives nerve fibers that originate in the nerve roots C5 and C6 (and sometimes C4). It is a mixed nerve, meaning that it provides both sensory and motor supply for the suprascapular region. Function: The  main function of this nerve is to provide motor innervation for two muscles, the supraspinatus and infraspinatus muscles. They are part of the rotator cuff muscles. In addition, the suprascapular nerve provides a sensory supply to the joints of the scapula (glenohumeral and acromioclavicular joints). Rationale (medical necessity): procedure needed and proper for the diagnosis and/or treatment of the patient's medical symptoms and needs.  Position / Prep / Materials:  Position: Prone Materials:  Tray: Block Needle(s):  Type: Spinal  Gauge (G): 22  Length: 3.5 in.  Qty: 1 Prep solution: ChloraPrep (2% chlorhexidine  gluconate and 70% isopropyl alcohol) Prep Area: Entire posterior shoulder area. From upper spine to shoulder proper (upper arm), and from lateral neck to lower tip of shoulder blade.   H&P (Pre-op Assessment):  [name] is a 68 y.o. (year old), female patient, seen today for interventional treatment. She  has a past surgical history that includes Back surgery; Breast surgery; Augmentation mammaplasty (Bilateral, 2006); Esophagogastroduodenoscopy (egd) with propofol  (N/A, 10/24/2020); Esophagogastroduodenoscopy (egd) with propofol  (N/A, 12/20/2020); Esophageal manometry (N/A, 03/04/2021); 24 hour ph study (N/A, 03/04/2021); PH impedance study (N/A, 03/04/2021); Vein Surgery (1991); Upper gastrointestinal endoscopy; Colonoscopy; Anterior cervical decomp/discectomy fusion (N/A, 05/12/2022); and Esophagogastroduodenoscopy (egd) with propofol  (N/A, 11/11/2022). [name] has a current medication list which includes the following prescription(s): amlodipine , aspirin , atorvastatin , calcium , cyanocobalamin , cyclobenzaprine , famotidine , gabapentin , guaifenesin -codeine , levothyroxine , meloxicam , multiple vitamin, omeprazole , probiotic product, triamterene -hydrochlorothiazide , turmeric, vitamin d , azithromycin , and [DISCONTINUED] trazodone . Her primarily concern today is the Neck Pain  (right)  Initial Vital Signs:  Pulse/HCG Rate: 80ECG Heart Rate: 71 Temp: (!) 97.5 F (36.4 C) Resp: 16 BP: 108/82 SpO2: 100 %  BMI: Estimated body mass index is 28.49 kg/m as calculated from the following:   Height as of this encounter: 5\' 4"  (1.626 m).   Weight  as of this encounter: 166 lb (75.3 kg).  Risk Assessment: Allergies: Reviewed. She has no known allergies.  Allergy Precautions: None required Coagulopathies: Reviewed. None identified.  Blood-thinner therapy: None at this time Active Infection(s): Reviewed. None identified. [name] is afebrile  Site Confirmation: [name] was asked to confirm the procedure and laterality before marking the site Procedure checklist: Completed Consent: Before the procedure and under the influence of no sedative(s), amnesic(s), or anxiolytics, the patient was informed of the treatment options, risks and possible complications. To fulfill our ethical and legal obligations, as recommended by the American Medical Association's Code of Ethics, I have informed the patient of my clinical impression; the nature and purpose of the treatment or procedure; the risks, benefits, and possible complications of the intervention; the alternatives, including doing nothing; the risk(s) and benefit(s) of the alternative treatment(s) or procedure(s); and the risk(s) and benefit(s) of doing nothing. The patient was provided information about the general risks and possible complications associated with the procedure. These may include, but are not limited to: failure to achieve desired goals, infection, bleeding, organ or nerve damage, allergic reactions, paralysis, and death. In addition, the patient was informed of those risks and complications associated to the procedure, such as failure to decrease pain; infection; bleeding; organ or nerve damage with subsequent damage to sensory, motor, and/or autonomic systems, resulting in permanent pain, numbness, and/or  weakness of one or several areas of the body; allergic reactions; (i.e.: anaphylactic reaction); and/or death. Furthermore, the patient was informed of those risks and complications associated with the medications. These include, but are not limited to: allergic reactions (i.e.: anaphylactic or anaphylactoid reaction(s)); adrenal axis suppression; blood sugar elevation that in diabetics may result in ketoacidosis or comma; water retention that in patients with history of congestive heart failure may result in shortness of breath, pulmonary edema, and decompensation with resultant heart failure; weight gain; swelling or edema; medication-induced neural toxicity; particulate matter embolism and blood vessel occlusion with resultant organ, and/or nervous system infarction; and/or aseptic necrosis of one or more joints. Finally, the patient was informed that Medicine is not an exact science; therefore, there is also the possibility of unforeseen or unpredictable risks and/or possible complications that may result in a catastrophic outcome. The patient indicated having understood very clearly. We have given the patient no guarantees and we have made no promises. Enough time was given to the patient to ask questions, all of which were answered to the patient's satisfaction. Ms. Renier has indicated that she wanted to continue with the procedure. Attestation: I, the ordering provider, attest that I have discussed with the patient the benefits, risks, side-effects, alternatives, likelihood of achieving goals, and potential problems during recovery for the procedure that I have provided informed consent. Date  Time: 02/10/2024  1:41 PM  Pre-Procedure Preparation:  Monitoring: As per clinic protocol. Respiration, ETCO2, SpO2, BP, heart rate and rhythm monitor placed and checked for adequate function Safety Precautions: Patient was assessed for positional comfort and pressure points before starting the  procedure. Time-out: I initiated and conducted the "Time-out" before starting the procedure, as per protocol. The patient was asked to participate by confirming the accuracy of the "Time Out" information. Verification of the correct person, site, and procedure were performed and confirmed by me, the nursing staff, and the patient. "Time-out" conducted as per Joint Commission's Universal Protocol (UP.01.01.01). Time: 1418 Start Time: 1418 hrs.  Description of Procedure:          Procedural Technique Safety  Precautions: Aspiration looking for blood return was conducted prior to all injections. At no point did we inject any substances, as a needle was being advanced. No attempts were made at seeking any paresthesias. Safe injection practices and needle disposal techniques used. Medications properly checked for expiration dates. SDV (single dose vial) medications used. Description of the Procedure: Protocol guidelines were followed. The patient was placed in position over the procedure table. The target area was identified and the area prepped in the usual manner. Skin & deeper tissues infiltrated with local anesthetic. Appropriate amount of time allowed to pass for local anesthetics to take effect. The procedure needles were then advanced to the target area. Proper needle placement secured. Negative aspiration confirmed. Solution injected in intermittent fashion, asking for systemic symptoms every 0.5cc of injectate. The needles were then removed and the area cleansed, making sure to leave some of the prepping solution back to take advantage of its long term bactericidal properties.  Vitals:   02/10/24 1347 02/10/24 1412 02/10/24 1420 02/10/24 1422  BP: 108/82 115/85 116/75 (!) 124/96  Pulse: 80     Resp: 16 18 17 18   Temp: (!) 97.5 F (36.4 C)     SpO2: 100% 97% 95% 100%  Weight: 166 lb (75.3 kg)     Height: 5\' 4"  (1.626 m)        Start Time: 1418 hrs. End Time: 1422 hrs.  Imaging Guidance  (Spinal):          Type of Imaging Technique: Fluoroscopy Guidance (Spinal) Indication(s): Fluoroscopy guidance for needle placement to enhance accuracy in procedures requiring precise needle localization for targeted delivery of medication in or near specific anatomical locations not easily accessible without such real-time imaging assistance. Exposure Time: Please see nurses notes. Contrast: Before injecting any contrast, we confirmed that the patient did not have an allergy to iodine, shellfish, or radiological contrast. Once satisfactory needle placement was completed at the desired level, radiological contrast was injected. Contrast injected under live fluoroscopy. No contrast complications. See chart for type and volume of contrast used. Fluoroscopic Guidance: I was personally present during the use of fluoroscopy. "Tunnel Vision Technique" used to obtain the best possible view of the target area. Parallax error corrected before commencing the procedure. "Direction-depth-direction" technique used to introduce the needle under continuous pulsed fluoroscopy. Once target was reached, antero-posterior, oblique, and lateral fluoroscopic projection used confirm needle placement in all planes. Images permanently stored in EMR. Interpretation: I personally interpreted the imaging intraoperatively. Adequate needle placement confirmed in multiple planes. Appropriate spread of contrast into desired area was observed. No evidence of afferent or efferent intravascular uptake. No intrathecal or subarachnoid spread observed. Permanent images saved into the patient's record.  Antibiotic Prophylaxis:   Anti-infectives (From admission, onward)    None      Indication(s): None identified   Post-operative Assessment:  Post-procedure Vital Signs:  Pulse/HCG Rate: 8068 Temp: (!) 97.5 F (36.4 C) Resp: 18 BP: (!) 124/96 SpO2: 100 %  EBL: None  Complications: No immediate post-treatment complications  observed by team, or reported by patient.  Note: The patient tolerated the entire procedure well. A repeat set of vitals were taken after the procedure and the patient was kept under observation following institutional policy, for this type of procedure. Post-procedural neurological assessment was performed, showing return to baseline, prior to discharge. The patient was provided with post-procedure discharge instructions, including a section on how to identify potential problems. Should any problems arise concerning this procedure, the patient was  given instructions to immediately contact us , at any time, without hesitation. In any case, we plan to contact the patient by telephone for a follow-up status report regarding this interventional procedure.  Comments:  No additional relevant information.  Plan of Care (POC)  Orders:  No orders of the defined types were placed in this encounter.   Medications ordered for procedure: Meds ordered this encounter  Medications   iohexol  (OMNIPAQUE ) 180 MG/ML injection 10 mL    Must be Myelogram-compatible. If not available, you may substitute with a water-soluble, non-ionic, hypoallergenic, myelogram-compatible radiological contrast medium.   lidocaine  (XYLOCAINE ) 2 % (with pres) injection 400 mg   ropivacaine  (PF) 2 mg/mL (0.2%) (NAROPIN ) injection 4 mL   dexamethasone  (DECADRON ) injection 10 mg   Medications administered: We administered iohexol , lidocaine , ropivacaine  (PF) 2 mg/mL (0.2%), and dexamethasone .  See the medical record for exact dosing, route, and time of administration.  Follow-up plan:   Return in about 6 weeks (around 03/23/2024) for PPE.       Right SI-J and Right Piriformis 04/17/21, 09/02/21. 11/12/22, RIGHT LFCN 06/17/23  Right glenohumeral joint injection 12/21/2023 Right suprascapular nerve block 02/10/2024     Recent Visits Date Type Provider Dept  02/09/24 Office Visit Cephus Collin, MD [facility]  12/21/23  Procedure visit Cephus Collin, MD [facility]  11/24/23 Office Visit Cephus Collin, MD [facility]  Showing recent visits within past 90 days and meeting all other requirements Today's Visits Date Type Provider Dept  02/10/24 Procedure visit Cephus Collin, MD [facility]  Showing today's visits and meeting all other requirements Future Appointments Date Type Provider Dept  03/23/24 Appointment Cephus Collin, MD [facility]  Showing future appointments within next 90 days and meeting all other requirements  Disposition: Discharge home  Discharge (Date  Time): 02/10/2024; 1432 hrs.   Primary Care Physician: Bluford Burkitt, NP Location: [facility] Note by: Cephus Collin, MD (TTS technology used. I apologize for any typographical errors that were not detected and corrected.) Date: 02/10/2024; Time: 2:56 PM  Disclaimer:  Medicine is not an Visual merchandiser. The only guarantee in medicine is that nothing is guaranteed. It is important to note that the decision to proceed with this intervention was based on the information collected from the patient. The Data and conclusions were drawn from the patient's questionnaire, the interview, and the physical examination. Because the information was provided in large part by the patient, it cannot be guaranteed that it has not been purposely or unconsciously manipulated. Every effort has been made to obtain as much relevant data as possible for this evaluation. It is important to note that the conclusions that lead to this procedure are derived in large part from the available data. Always take into account that the treatment will also be dependent on availability of resources and existing treatment guidelines, considered by other Pain Management Practitioners as being common knowledge and practice, at the time of the intervention. For Medico-Legal purposes, it is also important to point out that  variation in procedural techniques and pharmacological choices are the acceptable norm. The indications, contraindications, technique, and results of the above procedure should only be interpreted and judged by a Board-Certified Interventional Pain Specialist with extensive familiarity and expertise in the same exact procedure and technique.

## 2024-02-11 ENCOUNTER — Telehealth: Payer: Self-pay

## 2024-02-11 NOTE — Telephone Encounter (Signed)
 Post procedure follow up. Patient states she is doing fine.

## 2024-02-22 ENCOUNTER — Other Ambulatory Visit: Payer: Self-pay | Admitting: Nurse Practitioner

## 2024-02-22 DIAGNOSIS — K219 Gastro-esophageal reflux disease without esophagitis: Secondary | ICD-10-CM

## 2024-02-24 ENCOUNTER — Other Ambulatory Visit: Payer: Self-pay | Admitting: Nurse Practitioner

## 2024-02-24 DIAGNOSIS — E039 Hypothyroidism, unspecified: Secondary | ICD-10-CM

## 2024-02-24 NOTE — Telephone Encounter (Unsigned)
 Copied from CRM [id number]. Topic: Clinical - Medication Refill >> Feb 24, 2024  2:09 PM Adrionna Y wrote: Medication:  levothyroxine  (SYNTHROID ) 200 MCG tablet   Has the patient contacted their pharmacy? Yes (Agent: If no, request that the patient contact the pharmacy for the refill. If patient does not wish to contact the pharmacy document the reason why and proceed with request.) (Agent: If yes, when and what did the pharmacy advise?)  This is the patient's preferred pharmacy:  [facility] [address], [place] - [address] [place] [address] Phone: [phone] Fax: [phone]  Is this the correct pharmacy for this prescription? Yes If no, delete pharmacy and type the correct one.   Has the prescription been filled recently? No  Is the patient out of the medication? No, 2 pills left  Has the patient been seen for an appointment in the last year OR does the patient have an upcoming appointment? Yes  Can we respond through MyChart? Yes  Agent: Please be advised that Rx refills may take up to 3 business days. We ask that you follow-up with your pharmacy.

## 2024-02-25 ENCOUNTER — Other Ambulatory Visit: Payer: Self-pay | Admitting: Nurse Practitioner

## 2024-02-25 DIAGNOSIS — E039 Hypothyroidism, unspecified: Secondary | ICD-10-CM

## 2024-02-25 MED ORDER — LEVOTHYROXINE SODIUM 200 MCG PO TABS: 200.0000 ug | ORAL_TABLET | Freq: Every day | ORAL | 0 refills | Status: DC

## 2024-02-29 ENCOUNTER — Telehealth: Payer: Self-pay

## 2024-02-29 ENCOUNTER — Ambulatory Visit (INDEPENDENT_AMBULATORY_CARE_PROVIDER_SITE_OTHER): Admitting: Physician Assistant

## 2024-02-29 DIAGNOSIS — Z981 Arthrodesis status: Secondary | ICD-10-CM

## 2024-02-29 DIAGNOSIS — M542 Cervicalgia: Secondary | ICD-10-CM | POA: Diagnosis not present

## 2024-02-29 DIAGNOSIS — R200 Anesthesia of skin: Secondary | ICD-10-CM

## 2024-02-29 DIAGNOSIS — M5412 Radiculopathy, cervical region: Secondary | ICD-10-CM | POA: Diagnosis not present

## 2024-02-29 NOTE — Telephone Encounter (Signed)
 House call visit summary received and placed in provider to be signed folder

## 2024-02-29 NOTE — Addendum Note (Signed)
 Addended by: Ludwig Safer on: 02/29/2024 10:31 AM   Modules accepted: Orders

## 2024-02-29 NOTE — Progress Notes (Signed)
 Spoke with patient regarding her continued pain and recent results.  She continues to have neck stiffness and pain down her right upper extremity.  She continues to say that her neck and arm pain is all worse compared to prior to her last neck surgery.  We went over her EMG as well as her CT and MRI results in detail.  She also recently underwent a suprascapular nerve block from Dr. Rhesa Celeste but she does feel as though it helped her shoulder pain some.  I discussed with patient follow-up, and she has requested to see Dr. Felipe Horton.  In the meantime I have advised patient that she needs to begin physical therapy again even though she has completed it in the past.  I am happy to send this referral and set up her follow-up.  In the meantime encouraged her to reach out to us  for any changes.     [facility]  [address] [place], [address]  [place], [place] [address] Tel: [phone] Fax: [phone] Test Date:  01/11/2024   Patient: [name] DOB: Oct 03, 1955 Physician: Rommie Coats, MD  Sex: Female Height: 5\' 5"  Ref Phys: Ludwig Safer, PA-C  ID#: [id number]     Technician:      History: This is a 68 year old female with right upper limb numbness, tingling, and weakness.   NCV & EMG Findings: Extensive electrodiagnostic evaluation of the right upper limb shows: Right median, ulnar, and radial sensory responses are within normal limits. Right median (APB) and ulnar (ADM) motor responses are within normal limits. 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 Cervical paraspinal muscles were deferred due to history of prior cervical spine surgery.   Impression: This is an abnormal study. 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 No electrodiagnostic  evidence of a right median mononeuropathy at or distal to the wrist, consistent with carpal tunnel syndrome. Screening studies for right ulnar or radial mononeuropathies are normal.    EXAM: CT CERVICAL SPINE WITHOUT CONTRAST   TECHNIQUE: Multidetector CT imaging of the cervical spine was performed without intravenous contrast. Multiplanar CT image reconstructions were also generated.   RADIATION DOSE REDUCTION: This exam was performed according to the departmental dose-optimization program which includes automated exposure control, adjustment of the mA and/or kV according to patient size and/or use of iterative reconstruction technique.   COMPARISON:  MRI cervical spine 02/09/2024. Plain film cervical spine 12/15/2023.   FINDINGS: Alignment: There is straightening of the normal cervical lordosis.   Skull base and vertebrae: No acute fracture. No primary bone lesion or focal pathologic process. The patient is status post C4-7 ACDF. Hardware is intact and appears appropriately positioned. There is solid osseous fusion across each of the disc interspaces.   Soft tissues and spinal canal: Negative.   Disc levels:  C2-3: Negative.   C3-4: Tiny central protrusion seen on MRI is not visible on this study. No stenosis.   C4-5: Status post ACDF.  No stenosis.   C5-6: Status post ACDF. Mild to moderate foraminal narrowing on the right. The central canal and left foramen are open.   C6-7: Status post ACDF. Mild bilateral foraminal narrowing. Mild endplate spur to the right. The central canal appears open.   C7-T1: Negative.   Upper chest: Clear.   Other: None.   IMPRESSION:  1. Status post C4-7 ACDF. The hardware is intact and appears appropriately positioned. There is solid osseous fusion across each of the disc interspaces. 2. Mild to moderate foraminal narrowing on the right at C5-6 and mild bilateral foraminal narrowing at C6-7.  EXAM: MRI CERVICAL SPINE WITHOUT  CONTRAST   TECHNIQUE: Multiplanar, multisequence MR imaging of the cervical spine was performed. No intravenous contrast was administered.   COMPARISON:  MRI cervical spine 01/06/2022.   FINDINGS: Alignment: Straightening of the normal cervical lordosis. Trace anterolisthesis of C3 on C4.   Vertebrae: Anterior fusion hardware at C4-5 with interval anterior fusion at C5-C7 compared to prior MRI. Susceptibility artifact from hardware slightly limits evaluation. Within these limitations the bone marrow signal intensity is unremarkable. No bone marrow edema or evidence of acute fracture. Vertebral body heights are maintained.   Cord: Normal signal and morphology.   Posterior Fossa, vertebral arteries, paraspinal tissues: The visualized posterior fossa is unremarkable. Bilateral vertebral artery flow voids visualized. The paraspinal soft tissues are unremarkable.   Disc levels:   C2-3: No significant spinal canal stenosis. Bilateral facet arthrosis. Mild foraminal stenosis on the left.   C3-4: Anterolisthesis. Central disc protrusion which indents the ventral thecal sac without contacting the spinal cord. No significant spinal canal stenosis or significant foraminal stenosis.   C4-5: Postsurgical changes at this level. No significant spinal canal stenosis. Bilateral facet arthrosis. Mild foraminal stenosis on the left.   C5-6: Postsurgical changes at this level. Minimal disc bulge. No significant spinal canal stenosis. Bilateral facet arthrosis. Moderate right and mild left foraminal stenosis.   C6-7: Postsurgical changes at this level. Small disc osteophyte complex eccentric to the right which indents the ventral thecal sac with subtle flattening of the ventral cervical cord which is decreased compared to the prior study. Bilateral facet arthrosis. Mild bilateral foraminal stenosis.   C7-T1: No significant spinal canal stenosis. No significant foraminal stenosis.    IMPRESSION: Anterior cervical fusion spanning C4-C7. Interval improvement in patency of the spinal canal at C5-6 and C6-7 compared to prior.   Residual small disc osteophyte complex at C6-7 resulting in subtle flattening of the ventral cervical cord which is decreased compared to the prior study.   Multilevel foraminal stenosis, greatest and moderate on the right at C5-6. Foraminal patency at C5 and C6 appears slightly improved from prior MRI.   This visit was performed via telephone.  Patient location: home Provider location: office  I spent a total of 10 minutes non-face-to-face activities for this visit on the date of this encounter including review of current clinical condition and response to treatment.  The patient is aware of and accepts the limits of this telehealth visit.

## 2024-03-01 NOTE — Telephone Encounter (Signed)
 Form placed in red folder to be scanned into chart

## 2024-03-03 ENCOUNTER — Ambulatory Visit: Payer: Self-pay

## 2024-03-03 NOTE — Telephone Encounter (Signed)
 FYI Only or Action Required?: Action required by provider  Patient was last seen in primary care on 01/19/2024 by Bluford Burkitt, NP. Called Nurse Triage reporting Neck Pain. Symptoms began a week ago. Interventions attempted: Rest, hydration, or home remedies. Symptoms are: unchanged.  Triage Disposition: See PCP When Office is Open (Within 3 Days)  Patient/caregiver understands and will follow disposition?: Yes  No availability, pt. 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 Please advise.        Copied from CRM [id number]. Topic: Clinical - Red Word Triage >> Mar 03, 2024  2:39 PM Chuck Crater wrote: Red Word that prompted transfer to Nurse Triage: Patient has a stiff neck for 2 weeks and pain is starting from bottom neck to top of shoulder. She states that it affecting sleep and is extremely painful. Reason for Disposition  [1] MODERATE neck pain (e.g., interferes with normal activities) AND [2] present > 3 days  Answer Assessment - Initial Assessment Questions 1. ONSET: When did the pain begin?      2 weeks 2. LOCATION: Where does it hurt?      Left side 3. PATTERN Does the pain come and go, or has it been constant since it started?      Comes and goes 4. SEVERITY: How bad is the pain?  (Scale 1-10; or mild, moderate, severe)   - NO PAIN (0): no pain or only slight stiffness    - MILD (1-3): doesn't interfere with normal activities    - MODERATE (4-7): interferes with normal activities or awakens from sleep    - SEVERE (8-10):  excruciating pain, unable to do any normal activities      severe 5. RADIATION: Does the pain go anywhere else, shoot into your arms?     shoulder 6. CORD SYMPTOMS: Any weakness or numbness of the arms or legs?     no 7. CAUSE: What do you think is causing the neck pain?     unsure 8. NECK OVERUSE: Any recent activities that involved turning or twisting the neck?     no 9. OTHER SYMPTOMS: Do you have any other symptoms? (e.g., headache, fever,  chest pain, difficulty breathing, neck swelling)     no 10. PREGNANCY: Is there any chance you are pregnant? When was your last menstrual period?       no  Protocols used: Neck Pain or Stiffness-A-AH

## 2024-03-03 NOTE — Telephone Encounter (Signed)
 VM left asking pt to CB  (E2C2 IT IS OKAY TO RELAY PROVIDERS QUESTION BELOW)

## 2024-03-03 NOTE — Telephone Encounter (Signed)
 Copied from CRM [id number]. Topic: General - Other >> Mar 03, 2024  4:14 PM Adonis Hoot wrote: Reason for CRM: Patient returned call to [facility] [name].Message was relayed to patient from provider.Patient stated that she has never taken flexeril  medication. She would like to know what should she do next?

## 2024-03-09 ENCOUNTER — Ambulatory Visit: Admitting: Internal Medicine

## 2024-03-15 ENCOUNTER — Ambulatory Visit
Attending: Student in an Organized Health Care Education/Training Program | Admitting: Student in an Organized Health Care Education/Training Program

## 2024-03-15 ENCOUNTER — Encounter: Payer: Self-pay | Admitting: Student in an Organized Health Care Education/Training Program

## 2024-03-15 VITALS — BP 142/91 | HR 70 | Temp 97.3°F | Ht 64.0 in | Wt 168.0 lb

## 2024-03-15 DIAGNOSIS — G894 Chronic pain syndrome: Secondary | ICD-10-CM | POA: Insufficient documentation

## 2024-03-15 DIAGNOSIS — M25512 Pain in left shoulder: Secondary | ICD-10-CM | POA: Diagnosis not present

## 2024-03-15 DIAGNOSIS — G8929 Other chronic pain: Secondary | ICD-10-CM | POA: Insufficient documentation

## 2024-03-15 DIAGNOSIS — M75102 Unspecified rotator cuff tear or rupture of left shoulder, not specified as traumatic: Secondary | ICD-10-CM | POA: Insufficient documentation

## 2024-03-15 DIAGNOSIS — M12812 Other specific arthropathies, not elsewhere classified, left shoulder: Secondary | ICD-10-CM | POA: Insufficient documentation

## 2024-03-15 MED ORDER — TIZANIDINE HCL 4 MG PO TABS: 2.0000 mg | ORAL_TABLET | Freq: Two times a day (BID) | ORAL | 0 refills | Status: AC | PRN

## 2024-03-15 NOTE — Progress Notes (Signed)
 PROVIDER NOTE: Interpretation of information contained herein should be left to medically-trained personnel. Specific patient instructions are provided elsewhere under Patient Instructions section of medical record. This document was created in part using AI and STT-dictation technology, any transcriptional errors that may result from this process are unintentional.  Patient: [name]  Service: E/M   PCP: Gretel App, NP  DOB: Dec 12, 1955  DOS: 03/15/2024  Provider: Wallie Sherry, MD  MRN: [id number]  Delivery: Face-to-face  Specialty: Interventional Pain Management  Type: Established Patient  Setting: Ambulatory outpatient facility  Specialty designation: 09  Referring Prov.: Gretel App, NP  Location: Outpatient office facility       History of present illness (HPI) [name], a 68 y.o. year old female, is here today because of her Chronic pain syndrome [G89.4]. [name] primary complain today is Neck Pain (And top of left shoulder)  Pertinent problems: [name] does not have any pertinent problems on file.  Pain Assessment: Severity of Chronic pain is reported as a 9 /10. Location: Neck Left (shoulder)/radiates to top of left shoulder. Onset: More than a month ago. Quality: Spasm, Aching (pulling). Timing: Constant. Modifying factor(s): ice, meds. Vitals:  height is 5' 4 (1.626 m) and weight is 168 lb (76.2 kg). Her temperature is 97.3 F (36.3 C) (abnormal). Her blood pressure is 142/91 (abnormal) and her pulse is 70. Her oxygen saturation is 99%.  BMI: Estimated body mass index is 28.84 kg/m as calculated from the following:   Height as of this encounter: 5' 4 (1.626 m).   Weight as of this encounter: 168 lb (76.2 kg).  Last encounter: 02/09/2024. Last procedure: 02/10/2024.  Reason for encounter:   History of Present Illness   [name] is a 68 year old female with a history of c-spine surgery who presents with persistent right-sided pain and muscle  spasms.  She has been experiencing persistent right-sided pain and muscle spasms for about a month. Initially, she sought care at an urgent care facility where she was diagnosed with a spasm and strain. Despite using ice, painkillers, and muscle relaxers, there has been no relief. An injection for pain in her buttocks provided temporary relief.  She recalls a previous suprascapular nerve block on the right side which provided significant relief, allowing her to lift a five-pound weight without pain. She is concerned about the possibility of another surgery, as she feels her condition worsened after the last one.  Her current medications include baclofen and naproxen , which are ineffective. She has also tried cyclobenzaprine , which caused grogginess the next day. She has not tried tizanidine or Zanaflex.  She mentions a recent trip to [place] , where she experienced discomfort while sleeping on her daughter's pillow and believes she may have pulled a muscle while handling her suitcase. She does not drive and uses Pharmacist, community for transportation.         ROS  Constitutional: Denies any fever or chills Gastrointestinal: No reported hemesis, hematochezia, vomiting, or acute GI distress Musculoskeletal: Left and right cervical spine and shoulder pain Neurological: No reported episodes of acute onset apraxia, aphasia, dysarthria, agnosia, amnesia, paralysis, loss of coordination, or loss of consciousness  Medication Review  Aspirin , Calcium , Cyanocobalamin , Multiple Vitamin, Probiotic Product, Turmeric, Vitamin D , amLODipine , atorvastatin , azithromycin , famotidine , gabapentin , guaiFENesin -codeine , levothyroxine , meloxicam , naproxen , omeprazole , tiZANidine, traZODone , and triamterene -hydrochlorothiazide   History Review  Allergy: [name] has no known allergies. Drug: [name]  reports no history of drug use. Alcohol:  reports current alcohol  use of about 3.0 standard drinks of alcohol per  week. Tobacco:  reports that she has never smoked. She has never used smokeless tobacco. Social: [name]  reports that she has never smoked. She has never used smokeless tobacco. She reports current alcohol use of about 3.0 standard drinks of alcohol per week. She reports that she does not use drugs. Medical:  has a past medical history of Anemia, Anxiety, Arthritis, Bronchitis, COVID-19 (07/08/2019), Depression, GERD (gastroesophageal reflux disease), Hypertension, Hypothyroidism, Mini stroke, Osteoporosis, PTSD (post-traumatic stress disorder), Stroke (HCC) (2023), and Wears glasses. Surgical: [name]  has a past surgical history that includes Back surgery; Breast surgery; Augmentation mammaplasty (Bilateral, 2006); Esophagogastroduodenoscopy (egd) with propofol  (N/A, 10/24/2020); Esophagogastroduodenoscopy (egd) with propofol  (N/A, 12/20/2020); Esophageal manometry (N/A, 03/04/2021); 24 hour ph study (N/A, 03/04/2021); PH impedance study (N/A, 03/04/2021); Vein Surgery (1991); Upper gastrointestinal endoscopy; Colonoscopy; Anterior cervical decomp/discectomy fusion (N/A, 05/12/2022); and Esophagogastroduodenoscopy (egd) with propofol  (N/A, 11/11/2022). Family: family history includes Cancer in her father and sister; Colon cancer in her sister; Dementia in her mother; Diabetes in her father; Diabetes Mellitus I in her son; Heart Problems in her father; Heart disease in her father, mother, sister, and sister; Hypertension in her mother; Lung cancer in her maternal uncle; Osteoporosis in her mother; Ovarian cancer in her sister; Sickle cell trait in her daughter; Skin cancer in an other family member; Vitiligo in her mother and son.  Laboratory Chemistry Profile   Renal Lab Results  Component Value Date   BUN 19 01/19/2024   CREATININE 0.76 01/19/2024   GFR 81.94 01/19/2024   GFRAA >60 10/04/2019   GFRNONAA >60 11/14/2022    Hepatic Lab Results  Component Value Date   AST 24 01/19/2024   ALT  21 01/19/2024   ALBUMIN 4.4 01/19/2024   ALKPHOS 44 01/19/2024   LIPASE 56 (H) 11/14/2022    Electrolytes Lab Results  Component Value Date   NA 139 01/19/2024   K 4.1 01/19/2024   CL 105 01/19/2024   CALCIUM  9.8 01/19/2024    Bone Lab Results  Component Value Date   VD25OH 73.7 08/11/2023    Inflammation (CRP: Acute Phase) (ESR: Chronic Phase) No results found for: CRP, ESRSEDRATE, LATICACIDVEN       Note: Above Lab results reviewed.  Recent Imaging Review  DG PAIN CLINIC C-ARM 1-60 MIN NO REPORT Fluoro was used, but no Radiologist interpretation will be provided.  Please refer to NOTES tab for provider progress note. CT CERVICAL SPINE WO CONTRAST CLINICAL DATA:  Neck pain and right upper extremity weakness. The patient underwent cervical surgery in August, 2023.  EXAM: CT CERVICAL SPINE WITHOUT CONTRAST  TECHNIQUE: Multidetector CT imaging of the cervical spine was performed without intravenous contrast. Multiplanar CT image reconstructions were also generated.  RADIATION DOSE REDUCTION: This exam was performed according to the departmental dose-optimization program which includes automated exposure control, adjustment of the mA and/or kV according to patient size and/or use of iterative reconstruction technique.  COMPARISON:  MRI cervical spine 02/09/2024. Plain film cervical spine 12/15/2023.  FINDINGS: Alignment: There is straightening of the normal cervical lordosis.  Skull base and vertebrae: No acute fracture. No primary bone lesion or focal pathologic process. The patient is status post C4-7 ACDF. Hardware is intact and appears appropriately positioned. There is solid osseous fusion across each of the disc interspaces.  Soft tissues and spinal canal: Negative.  Disc levels:  C2-3: Negative.  C3-4: Tiny central protrusion seen on MRI is not visible on  this study. No stenosis.  C4-5: Status post ACDF.  No stenosis.  C5-6: Status post  ACDF. Mild to moderate foraminal narrowing on the right. The central canal and left foramen are open.  C6-7: Status post ACDF. Mild bilateral foraminal narrowing. Mild endplate spur to the right. The central canal appears open.  C7-T1: Negative.  Upper chest: Clear.  Other: None.  IMPRESSION: 1. Status post C4-7 ACDF. The hardware is intact and appears appropriately positioned. There is solid osseous fusion across each of the disc interspaces. 2. Mild to moderate foraminal narrowing on the right at C5-6 and mild bilateral foraminal narrowing at C6-7.  Electronically Signed   By: Debby Prader M.D.   On: 02/10/2024 09:19 Note: Reviewed        Physical Exam  General appearance: Well nourished, well developed, and well hydrated. In no apparent acute distress Mental status: Alert, oriented x 3 (person, place, & time)       Respiratory: No evidence of acute respiratory distress Eyes: PERLA Vitals: BP (!) 142/91   Pulse 70   Temp (!) 97.3 F (36.3 C)   Ht 5' 4 (1.626 m)   Wt 168 lb (76.2 kg)   SpO2 99%   BMI 28.84 kg/m  BMI: Estimated body mass index is 28.84 kg/m as calculated from the following:   Height as of this encounter: 5' 4 (1.626 m).   Weight as of this encounter: 168 lb (76.2 kg). Ideal: Ideal body weight: 54.7 kg (120 lb 9.5 oz) Adjusted ideal body weight: 63.3 kg (139 lb 8.9 oz)  Left shoulder pain  Assessment   Diagnosis  1. Chronic pain syndrome   2. Chronic left shoulder pain   3. Left rotator cuff tear arthropathy      Updated Problems: No problems updated.  Plan of Care  Problem-specific:  Assessment and Plan    Bilateral shoulder pain positive response to right suprascapular nerve block and is now having left-sided shoulder pain as well that is worse with shoulder abduction.  We discussed a left diagnostic suprascapular nerve block and if that is helpful we can plan for bilateral suprascapular nerve ablations.  Muscle spasm and strain    Muscle spasm and strain are likely due to recent travel and physical activity. Previous treatments with baclofen and cyclobenzaprine  were ineffective and caused grogginess. Alternative muscle relaxant options were discussed. Prescribe tizanidine, instructing her to take half to one tablet at night, with the option for twice daily as needed. Discontinue baclofen and cyclobenzaprine .       [name] has a current medication list which includes the following long-term medication(s): amlodipine , atorvastatin , calcium , famotidine , gabapentin , levothyroxine , omeprazole , triamterene -hydrochlorothiazide , and [DISCONTINUED] trazodone .  Pharmacotherapy (Medications Ordered): Meds ordered this encounter  Medications   tiZANidine (ZANAFLEX) 4 MG tablet    Sig: Take 0.5-1 tablets (2-4 mg total) by mouth every 12 (twelve) hours as needed for muscle spasms.    Dispense:  60 tablet    Refill:  0    Do not place this medication, or any other prescription from our practice, on Automatic Refill. Patient may have prescription filled one day early if pharmacy is closed on scheduled refill date.   Orders:  Orders Placed This Encounter  Procedures   SUPRASCAPULAR NERVE BLOCK    For shoulder pain.    Standing Status:   Future    Expected Date:   03/29/2024    Expiration Date:   03/15/2025    Scheduling Instructions:  Purpose: LEFT     Level(s): Suprascapular notch     Sedation: PO Valium     Scheduling Timeframe: As permitted by the schedule    Where will this procedure be performed?:   [facility]     Right SI-J and Right Piriformis 04/17/21, 09/02/21. 11/12/22, RIGHT LFCN 06/17/23  Right glenohumeral joint injection 12/21/2023 Right suprascapular nerve block 02/10/2024     Return in about 2 weeks (around 03/29/2024) for Left SSN block , in clinic (PO Valium 5mg ).    Recent Visits Date Type Provider Dept  02/10/24 Procedure visit Marcelino Nurse, MD [facility]  02/09/24  Office Visit Marcelino Nurse, MD [facility]  12/21/23 Procedure visit Marcelino Nurse, MD [facility]  Showing recent visits within past 90 days and meeting all other requirements Today's Visits Date Type Provider Dept  03/15/24 Office Visit Marcelino Nurse, MD [facility]  Showing today's visits and meeting all other requirements Future Appointments Date Type Provider Dept  03/23/24 Appointment Marcelino Nurse, MD [facility]  03/30/24 Appointment Marcelino Nurse, MD [facility]  Showing future appointments within next 90 days and meeting all other requirements  I discussed the assessment and treatment plan with the patient. The patient was provided an opportunity to ask questions and all were answered. The patient agreed with the plan and demonstrated an understanding of the instructions.  Patient advised to call back or seek an in-person evaluation if the symptoms or condition worsens.  Duration of encounter: .  Total time on encounter, as per AMA guidelines included both the face-to-face and non-face-to-face time personally spent by the physician and/or other qualified health care professional(s) on the day of the encounter (includes time in activities that require the physician or other qualified health care professional and does not include time in activities normally performed by clinical staff). Physician's time may include the following activities when performed: Preparing to see the patient (e.g., pre-charting review of records, searching for previously ordered imaging, lab work, and nerve conduction tests) Review of prior analgesic pharmacotherapies. Reviewing PMP Interpreting ordered tests (e.g., lab work, imaging, nerve conduction tests) Performing post-procedure evaluations, including interpretation of diagnostic procedures Obtaining and/or reviewing separately obtained history Performing a medically appropriate examination  and/or evaluation Counseling and educating the patient/family/caregiver Ordering medications, tests, or procedures Referring and communicating with other health care professionals (when not separately reported) Documenting clinical information in the electronic or other health record Independently interpreting results (not separately reported) and communicating results to the patient/ family/caregiver Care coordination (not separately reported)  Note by: Nurse Marcelino, MD (TTS and AI technology used. I apologize for any typographical errors that were not detected and corrected.) Date: 03/15/2024; Time: 10:52 AM

## 2024-03-15 NOTE — Progress Notes (Signed)
 Safety precautions to be maintained throughout the outpatient stay will include: orient to surroundings, keep bed in low position, maintain call bell within reach at all times, provide assistance with transfer out of bed and ambulation.

## 2024-03-15 NOTE — Patient Instructions (Addendum)

## 2024-03-23 ENCOUNTER — Telehealth: Admitting: Student in an Organized Health Care Education/Training Program

## 2024-03-30 ENCOUNTER — Ambulatory Visit (HOSPITAL_BASED_OUTPATIENT_CLINIC_OR_DEPARTMENT_OTHER): Admitting: Student in an Organized Health Care Education/Training Program

## 2024-03-30 ENCOUNTER — Ambulatory Visit
Admission: RE | Admit: 2024-03-30 | Discharge: 2024-03-30 | Disposition: A | Discharge status: Home / Self Care | Source: Ambulatory Visit | Attending: Student in an Organized Health Care Education/Training Program | Admitting: Student in an Organized Health Care Education/Training Program

## 2024-03-30 VITALS — BP 115/94 | HR 71 | Temp 97.2°F | Resp 16 | Ht 65.0 in | Wt 166.0 lb

## 2024-03-30 DIAGNOSIS — M12812 Other specific arthropathies, not elsewhere classified, left shoulder: Secondary | ICD-10-CM | POA: Diagnosis not present

## 2024-03-30 DIAGNOSIS — G8929 Other chronic pain: Secondary | ICD-10-CM | POA: Diagnosis not present

## 2024-03-30 DIAGNOSIS — M75102 Unspecified rotator cuff tear or rupture of left shoulder, not specified as traumatic: Secondary | ICD-10-CM | POA: Diagnosis not present

## 2024-03-30 DIAGNOSIS — M25512 Pain in left shoulder: Secondary | ICD-10-CM | POA: Insufficient documentation

## 2024-03-30 MED ORDER — DEXAMETHASONE SODIUM PHOSPHATE 10 MG/ML IJ SOLN
10.0000 mg | Freq: Once | INTRAMUSCULAR | Status: AC
  Administered 2024-03-30: 10 mg
  Filled 2024-03-30: qty 1

## 2024-03-30 MED ORDER — DIAZEPAM 5 MG PO TABS
ORAL_TABLET | ORAL | Status: AC
  Filled 2024-03-30: qty 1

## 2024-03-30 MED ORDER — ROPIVACAINE HCL 2 MG/ML IJ SOLN
9.0000 mL | Freq: Once | INTRAMUSCULAR | Status: AC
  Administered 2024-03-30: 9 mL via PERINEURAL
  Filled 2024-03-30: qty 20

## 2024-03-30 MED ORDER — LIDOCAINE HCL 2 % IJ SOLN
20.0000 mL | Freq: Once | INTRAMUSCULAR | Status: AC
  Administered 2024-03-30: 200 mg
  Filled 2024-03-30: qty 40

## 2024-03-30 MED ORDER — IOHEXOL 180 MG/ML  SOLN
10.0000 mL | Freq: Once | INTRAMUSCULAR | Status: AC
  Administered 2024-03-30: 10 mL via EPIDURAL
  Filled 2024-03-30: qty 20

## 2024-03-30 NOTE — Patient Instructions (Signed)

## 2024-03-30 NOTE — Progress Notes (Signed)
 PROVIDER NOTE: Interpretation of information contained herein should be left to medically-trained personnel. Specific patient instructions are provided elsewhere under Patient Instructions section of medical record. This document was created in part using STT-dictation technology, any transcriptional errors that may result from this process are unintentional.  Patient: [name] Type: Established DOB: 1956-04-18 MRN: [id number] PCP: Gretel App, NP  Service: Procedure DOS: 03/30/2024 Setting: Ambulatory Location: Ambulatory outpatient facility Delivery: Face-to-face Provider: Wallie Sherry, MD Specialty: Interventional Pain Management Specialty designation: 09 Location: Outpatient facility Ref. Prov.: Gretel App, NP       Interventional Therapy   Procedure: Suprascapular nerve block (SSNB) #1  Laterality:  Left  Level: Superior to scapular spine, lateral to supraspinatus fossa (Suprascapular notch).  Imaging: Fluoroscopic guidance         Anesthesia: Local anesthesia (1-2% Lidocaine ) DOS: 03/30/2024  Performed by: Wallie Sherry, MD  Purpose: Diagnostic/Therapeutic Indications: Shoulder pain, severe enough to impact quality of life and/or function. 1. Chronic left shoulder pain   2. Left rotator cuff tear arthropathy    NAS-11 score:   Pre-procedure: 8 /10   Post-procedure: 0-No pain/10     Target: Suprascapular nerve Location: midway between the medial border of the scapula and the acromion as it runs through the suprascapular notch. Region: Suprascapular, posterior shoulder  Approach: Percutaneous  Neuroanatomy: The suprascapular nerve is the lateral branch of the superior trunk of the brachial plexus. It receives nerve fibers that originate in the nerve roots C5 and C6 (and sometimes C4). It is a mixed nerve, meaning that it provides both sensory and motor supply for the suprascapular region. Function: The main function of this nerve is to provide motor innervation for two  muscles, the supraspinatus and infraspinatus muscles. They are part of the rotator cuff muscles. In addition, the suprascapular nerve provides a sensory supply to the joints of the scapula (glenohumeral and acromioclavicular joints). Rationale (medical necessity): procedure needed and proper for the diagnosis and/or treatment of the patient's medical symptoms and needs.  Position / Prep / Materials:  Position: Prone Materials:  Tray: Block Needle(s):  Type: Spinal  Gauge (G): 22  Length: 3.5 in.  Qty: 1 Prep solution: ChloraPrep (2% chlorhexidine  gluconate and 70% isopropyl alcohol) Prep Area: Entire posterior shoulder area. From upper spine to shoulder proper (upper arm), and from lateral neck to lower tip of shoulder blade.   H&P (Pre-op Assessment):  [name] is a 68 y.o. (year old), female patient, seen today for interventional treatment. She  has a past surgical history that includes Back surgery; Breast surgery; Augmentation mammaplasty (Bilateral, 2006); Esophagogastroduodenoscopy (egd) with propofol  (N/A, 10/24/2020); Esophagogastroduodenoscopy (egd) with propofol  (N/A, 12/20/2020); Esophageal manometry (N/A, 03/04/2021); 24 hour ph study (N/A, 03/04/2021); PH impedance study (N/A, 03/04/2021); Vein Surgery (1991); Upper gastrointestinal endoscopy; Colonoscopy; Anterior cervical decomp/discectomy fusion (N/A, 05/12/2022); and Esophagogastroduodenoscopy (egd) with propofol  (N/A, 11/11/2022). [name] has a current medication list which includes the following prescription(s): amlodipine , aspirin , atorvastatin , azithromycin , calcium , cyanocobalamin , famotidine , gabapentin , levothyroxine , meloxicam , multiple vitamin, naproxen , omeprazole , probiotic product, tizanidine , triamterene -hydrochlorothiazide , turmeric, vitamin d , guaifenesin -codeine , and [DISCONTINUED] trazodone , and the following Facility-Administered Medications: dexamethasone , iohexol , lidocaine , and ropivacaine  (pf) 2 mg/ml (0.2%).  Her primarily concern today is the Shoulder Pain (right)  Initial Vital Signs:  Pulse/HCG Rate: 71ECG Heart Rate: 70 Temp: (!) 97.2 F (36.2 C) Resp: 16 BP: 124/86 SpO2: 100 %  BMI: Estimated body mass index is 27.62 kg/m as calculated from the following:   Height as of this encounter: 5' 5 (1.651  m).   Weight as of this encounter: 166 lb (75.3 kg).  Risk Assessment: Allergies: Reviewed. She has no known allergies.  Allergy Precautions: None required Coagulopathies: Reviewed. None identified.  Blood-thinner therapy: None at this time Active Infection(s): Reviewed. None identified. [name] is afebrile  Site Confirmation: [name] was asked to confirm the procedure and laterality before marking the site Procedure checklist: Completed Consent: Before the procedure and under the influence of no sedative(s), amnesic(s), or anxiolytics, the patient was informed of the treatment options, risks and possible complications. To fulfill our ethical and legal obligations, as recommended by the American Medical Association's Code of Ethics, I have informed the patient of my clinical impression; the nature and purpose of the treatment or procedure; the risks, benefits, and possible complications of the intervention; the alternatives, including doing nothing; the risk(s) and benefit(s) of the alternative treatment(s) or procedure(s); and the risk(s) and benefit(s) of doing nothing. The patient was provided information about the general risks and possible complications associated with the procedure. These may include, but are not limited to: failure to achieve desired goals, infection, bleeding, organ or nerve damage, allergic reactions, paralysis, and death. In addition, the patient was informed of those risks and complications associated to the procedure, such as failure to decrease pain; infection; bleeding; organ or nerve damage with subsequent damage to sensory, motor, and/or autonomic systems,  resulting in permanent pain, numbness, and/or weakness of one or several areas of the body; allergic reactions; (i.e.: anaphylactic reaction); and/or death. Furthermore, the patient was informed of those risks and complications associated with the medications. These include, but are not limited to: allergic reactions (i.e.: anaphylactic or anaphylactoid reaction(s)); adrenal axis suppression; blood sugar elevation that in diabetics may result in ketoacidosis or comma; water retention that in patients with history of congestive heart failure may result in shortness of breath, pulmonary edema, and decompensation with resultant heart failure; weight gain; swelling or edema; medication-induced neural toxicity; particulate matter embolism and blood vessel occlusion with resultant organ, and/or nervous system infarction; and/or aseptic necrosis of one or more joints. Finally, the patient was informed that Medicine is not an exact science; therefore, there is also the possibility of unforeseen or unpredictable risks and/or possible complications that may result in a catastrophic outcome. The patient indicated having understood very clearly. We have given the patient no guarantees and we have made no promises. Enough time was given to the patient to ask questions, all of which were answered to the patient's satisfaction. Ms. Mandarino has indicated that she wanted to continue with the procedure. Attestation: I, the ordering provider, attest that I have discussed with the patient the benefits, risks, side-effects, alternatives, likelihood of achieving goals, and potential problems during recovery for the procedure that I have provided informed consent. Date  Time: 03/30/2024 10:42 AM  Pre-Procedure Preparation:  Monitoring: As per clinic protocol. Respiration, ETCO2, SpO2, BP, heart rate and rhythm monitor placed and checked for adequate function Safety Precautions: Patient was assessed for positional comfort and  pressure points before starting the procedure. Time-out: I initiated and conducted the Time-out before starting the procedure, as per protocol. The patient was asked to participate by confirming the accuracy of the Time Out information. Verification of the correct person, site, and procedure were performed and confirmed by me, the nursing staff, and the patient. Time-out conducted as per Joint Commission's Universal Protocol (UP.01.01.01). Time: 1131 Start Time: 1131 hrs.  Description of Procedure:  Procedural Technique Safety Precautions: Aspiration looking for blood return was conducted prior to all injections. At no point did we inject any substances, as a needle was being advanced. No attempts were made at seeking any paresthesias. Safe injection practices and needle disposal techniques used. Medications properly checked for expiration dates. SDV (single dose vial) medications used. Description of the Procedure: Protocol guidelines were followed. The patient was placed in position over the procedure table. The target area was identified and the area prepped in the usual manner. Skin & deeper tissues infiltrated with local anesthetic. Appropriate amount of time allowed to pass for local anesthetics to take effect. The procedure needles were then advanced to the target area. Proper needle placement secured. Negative aspiration confirmed. Solution injected in intermittent fashion, asking for systemic symptoms every 0.5cc of injectate. The needles were then removed and the area cleansed, making sure to leave some of the prepping solution back to take advantage of its long term bactericidal properties.  Vitals:   03/30/24 1048 03/30/24 1136 03/30/24 1140  BP: 124/86 125/86 (!) 115/94  Pulse: 71    Resp: 16 16 16   Temp: (!) 97.2 F (36.2 C)    TempSrc: Temporal    SpO2: 100% 100% 100%  Weight: 166 lb (75.3 kg)    Height: 5' 5 (1.651 m)       Start Time: 1131 hrs. End Time: 1137  hrs.  Imaging Guidance (Spinal):          Type of Imaging Technique: Fluoroscopy Guidance (Spinal) Indication(s): Fluoroscopy guidance for needle placement to enhance accuracy in procedures requiring precise needle localization for targeted delivery of medication in or near specific anatomical locations not easily accessible without such real-time imaging assistance. Exposure Time: Please see nurses notes. Contrast: Before injecting any contrast, we confirmed that the patient did not have an allergy to iodine, shellfish, or radiological contrast. Once satisfactory needle placement was completed at the desired level, radiological contrast was injected. Contrast injected under live fluoroscopy. No contrast complications. See chart for type and volume of contrast used. Fluoroscopic Guidance: I was personally present during the use of fluoroscopy. Tunnel Vision Technique used to obtain the best possible view of the target area. Parallax error corrected before commencing the procedure. Direction-depth-direction technique used to introduce the needle under continuous pulsed fluoroscopy. Once target was reached, antero-posterior, oblique, and lateral fluoroscopic projection used confirm needle placement in all planes. Images permanently stored in EMR. Interpretation: I personally interpreted the imaging intraoperatively. Adequate needle placement confirmed in multiple planes. Appropriate spread of contrast into desired area was observed. No evidence of afferent or efferent intravascular uptake. No intrathecal or subarachnoid spread observed. Permanent images saved into the patient's record.  Antibiotic Prophylaxis:   Anti-infectives (From admission, onward)    None      Indication(s): None identified   Post-operative Assessment:  Post-procedure Vital Signs:  Pulse/HCG Rate: 7175 Temp: (!) 97.2 F (36.2 C) Resp: 16 BP: (!) 115/94 SpO2: 100 %  EBL: None  Complications: No immediate  post-treatment complications observed by team, or reported by patient.  Note: The patient tolerated the entire procedure well. A repeat set of vitals were taken after the procedure and the patient was kept under observation following institutional policy, for this type of procedure. Post-procedural neurological assessment was performed, showing return to baseline, prior to discharge. The patient was provided with post-procedure discharge instructions, including a section on how to identify potential problems. Should any problems arise concerning this procedure, the patient was given  instructions to immediately contact us , at any time, without hesitation. In any case, we plan to contact the patient by telephone for a follow-up status report regarding this interventional procedure.  Comments:  No additional relevant information.  Plan of Care (POC)  Orders:  Orders Placed This Encounter  Procedures   DG PAIN CLIN[facility] C-ARM 1-60 MIN NO REPORT    Intraoperative interpretation by procedural physician at [facility].    Standing Status:   Standing    Number of Occurrences:   1    Reason for exam::   Assistance in needle guidance and placement for procedures requiring needle placement in or near specific anatomical locations not easily accessible without such assistance.    Medications ordered for procedure: Meds ordered this encounter  Medications   iohexol  (OMNIPAQUE ) 180 MG/ML injection 10 mL    Must be Myelogram-compatible. If not available, you may substitute with a water-soluble, non-ionic, hypoallergenic, myelogram-compatible radiological contrast medium.   lidocaine  (XYLOCAINE ) 2 % (with pres) injection 400 mg   ropivacaine  (PF) 2 mg/mL (0.2%) (NAROPIN ) injection 9 mL   dexamethasone  (DECADRON ) injection 10 mg   Medications administered: Shamel S. Genni Buske had no medications administered during this visit.  See the medical record for exact dosing, route, and time of  administration.  Follow-up plan:   Return in about 4 weeks (around 04/27/2024) for PPE VV.       Right SI-J and Right Piriformis 04/17/21, 09/02/21. 11/12/22, RIGHT LFCN 06/17/23  Right glenohumeral joint injection 12/21/2023 Right suprascapular nerve block 02/10/2024 Left SSNB 03/30/24     Recent Visits Date Type Provider Dept  03/15/24 Office Visit Marcelino Nurse, MD [facility]  02/10/24 Procedure visit Marcelino Nurse, MD [facility]  02/09/24 Office Visit Marcelino Nurse, MD [facility]  Showing recent visits within past 90 days and meeting all other requirements Today's Visits Date Type Provider Dept  03/30/24 Procedure visit Marcelino Nurse, MD [facility]  Showing today's visits and meeting all other requirements Future Appointments Date Type Provider Dept  04/27/24 Appointment Marcelino Nurse, MD [facility]  Showing future appointments within next 90 days and meeting all other requirements  Disposition: Discharge home  Discharge (Date  Time): 03/30/2024; 1138 hrs.   Primary Care Physician: Gretel App, NP Location: [facility] Note by: Nurse Marcelino, MD (TTS technology used. I apologize for any typographical errors that were not detected and corrected.) Date: 03/30/2024; Time: 1:29 PM  Disclaimer:  Medicine is not an Visual merchandiser. The only guarantee in medicine is that nothing is guaranteed. It is important to note that the decision to proceed with this intervention was based on the information collected from the patient. The Data and conclusions were drawn from the patient's questionnaire, the interview, and the physical examination. Because the information was provided in large part by the patient, it cannot be guaranteed that it has not been purposely or unconsciously manipulated. Every effort has been made to obtain as much relevant data as possible for this evaluation. It is important to note that the  conclusions that lead to this procedure are derived in large part from the available data. Always take into account that the treatment will also be dependent on availability of resources and existing treatment guidelines, considered by other Pain Management Practitioners as being common knowledge and practice, at the time of the intervention. For Medico-Legal purposes, it is also important to point out that variation in procedural techniques and pharmacological choices  are the acceptable norm. The indications, contraindications, technique, and results of the above procedure should only be interpreted and judged by a Board-Certified Interventional Pain Specialist with extensive familiarity and expertise in the same exact procedure and technique.

## 2024-03-31 ENCOUNTER — Telehealth: Payer: Self-pay | Admitting: *Deleted

## 2024-03-31 NOTE — Telephone Encounter (Signed)
 Called for post procedure check. Denies any problems and states doing well.

## 2024-04-11 ENCOUNTER — Encounter: Payer: Self-pay | Admitting: Nurse Practitioner

## 2024-04-12 ENCOUNTER — Other Ambulatory Visit: Payer: Self-pay | Admitting: Nurse Practitioner

## 2024-04-12 DIAGNOSIS — K5900 Constipation, unspecified: Secondary | ICD-10-CM

## 2024-04-12 MED ORDER — LINACLOTIDE 145 MCG PO CAPS: 145.0000 ug | ORAL_CAPSULE | Freq: Every day | ORAL | 0 refills | Status: DC

## 2024-04-27 ENCOUNTER — Ambulatory Visit
Attending: Student in an Organized Health Care Education/Training Program | Admitting: Student in an Organized Health Care Education/Training Program

## 2024-04-27 DIAGNOSIS — G894 Chronic pain syndrome: Secondary | ICD-10-CM | POA: Diagnosis not present

## 2024-04-27 DIAGNOSIS — M25561 Pain in right knee: Secondary | ICD-10-CM

## 2024-04-27 DIAGNOSIS — M75102 Unspecified rotator cuff tear or rupture of left shoulder, not specified as traumatic: Secondary | ICD-10-CM | POA: Diagnosis not present

## 2024-04-27 DIAGNOSIS — M25512 Pain in left shoulder: Secondary | ICD-10-CM | POA: Diagnosis not present

## 2024-04-27 DIAGNOSIS — G8929 Other chronic pain: Secondary | ICD-10-CM

## 2024-04-27 DIAGNOSIS — M12812 Other specific arthropathies, not elsewhere classified, left shoulder: Secondary | ICD-10-CM

## 2024-04-27 DIAGNOSIS — M25562 Pain in left knee: Secondary | ICD-10-CM

## 2024-04-27 MED ORDER — DICLOFENAC SODIUM 75 MG PO TBEC: 75.0000 mg | DELAYED_RELEASE_TABLET | Freq: Two times a day (BID) | ORAL | 0 refills | Status: AC

## 2024-04-27 NOTE — Progress Notes (Signed)
 Draughon this is Dr. Lazarus PROVIDER NOTE: Interpretation of information contained herein should be left to medically-trained personnel. Specific patient instructions are provided elsewhere under Patient Instructions section of medical record. This document was created in part using AI and STT-dictation technology, any transcriptional errors that may result from this process are unintentional.  Patient: [name]  Service: E/M   PCP: Gretel App, NP  DOB: 21-Nov-1955  DOS: 04/27/2024  Provider: Wallie Sherry, MD  MRN: [id number]  Delivery: Virtual Visit  Specialty: Interventional Pain Management  Type: Established Patient  Setting: Ambulatory outpatient facility  Specialty designation: 09  Referring Prov.: Gretel App, NP  Location: Remote location       Virtual Encounter - Pain Management PROVIDER NOTE: Information contained herein reflects review and annotations entered in association with encounter. Interpretation of such information and data should be left to medically-trained personnel. Information provided to patient can be located elsewhere in the medical record under Patient Instructions. Document created using STT-dictation technology, any transcriptional errors that may result from process are unintentional.    Contact & Pharmacy Preferred: [phone] Home: [phone] (home) Mobile: [phone] (mobile) E-mail: [email or web address]  [facility] - [place], [place] - [address] [place] [address] Phone: [phone] Fax: [phone]   Pre-screening  [name] offered in-person vs virtual encounter. She indicated preferring virtual for this encounter.   Reason COVID-19*  Social distancing based on CDC and AMA recommendations.   I contacted [name] on 04/27/2024 via telephone.      I clearly identified myself as Wallie Sherry, MD. I verified that I was speaking with the correct person using two identifiers (Name: [name], and  date of birth: [birth year]).  Consent I sought verbal advanced consent from [name] for virtual visit interactions. I informed [name] of possible security and privacy concerns, risks, and limitations associated with providing not-in-person medical evaluation and management services. I also informed [name] of the availability of in-person appointments. Finally, I informed her that there would be a charge for the virtual visit and that she could be  personally, fully or partially, financially responsible for it. [name] expressed understanding and agreed to proceed.   Historic Elements   [name] is a 68 y.o. year old, female patient evaluated today after our last contact on 03/30/2024. [name]  has a past medical history of Anemia, Anxiety, Arthritis, Bronchitis, COVID-19 (07/08/2019), Depression, GERD (gastroesophageal reflux disease), Hypertension, Hypothyroidism, Mini stroke, Osteoporosis, PTSD (post-traumatic stress disorder), Stroke (HCC) (2023), and Wears glasses. She also  has a past surgical history that includes Back surgery; Breast surgery; Augmentation mammaplasty (Bilateral, 2006); Esophagogastroduodenoscopy (egd) with propofol  (N/A, 10/24/2020); Esophagogastroduodenoscopy (egd) with propofol  (N/A, 12/20/2020); Esophageal manometry (N/A, 03/04/2021); 24 hour ph study (N/A, 03/04/2021); PH impedance study (N/A, 03/04/2021); Vein Surgery (1991); Upper gastrointestinal endoscopy; Colonoscopy; Anterior cervical decomp/discectomy fusion (N/A, 05/12/2022); and Esophagogastroduodenoscopy (egd) with propofol  (N/A, 11/11/2022). [name] has a current medication list which includes the following prescription(s): amlodipine , aspirin , atorvastatin , azithromycin , calcium , cyanocobalamin , diclofenac , famotidine , gabapentin , guaifenesin -codeine , levothyroxine , linaclotide , meloxicam , multiple vitamin, naproxen , omeprazole , probiotic product, triamterene -hydrochlorothiazide , turmeric,  vitamin d , and [DISCONTINUED] trazodone . She  reports that she has never smoked. She has never used smokeless tobacco. She reports current alcohol use of about 3.0 standard drinks of alcohol per week. She reports that she does not use drugs. [name] has no known allergies.  BMI: Estimated body mass index is 27.62 kg/m as calculated from the following:  Height as of 03/30/24: 5' 5 (1.651 m).   Weight as of 03/30/24: 166 lb (75.3 kg). Last encounter: 03/15/2024. Last procedure: 03/30/2024.  HPI  Today, she is being contacted for a post-procedure assessment.   Post-Procedure Evaluation   Procedure: Suprascapular nerve block (SSNB) #1  Laterality:  Left  Level: Superior to scapular spine, lateral to supraspinatus fossa (Suprascapular notch).  Imaging: Fluoroscopic guidance         Anesthesia: Local anesthesia (1-2% Lidocaine ) DOS: 03/30/2024  Performed by: Wallie Sherry, MD  Purpose: Diagnostic/Therapeutic Indications: Shoulder pain, severe enough to impact quality of life and/or function. 1. Chronic left shoulder pain   2. Left rotator cuff tear arthropathy    NAS-11 score:   Pre-procedure: 8 /10   Post-procedure: 0-No pain/10     Effectiveness:  Initial hour after procedure: 75 %  Subsequent 4-6 hours post-procedure: 75 %  Analgesia past initial 6 hours: 50 % (ongoing)  Ongoing improvement:  Analgesic:  50% Function: [name] reports improvement in function ROM: [name] reports improvement in ROM   Laboratory Chemistry Profile   Renal Lab Results  Component Value Date   BUN 19 01/19/2024   CREATININE 0.76 01/19/2024   GFR 81.94 01/19/2024   GFRAA >60 10/04/2019   GFRNONAA >60 11/14/2022    Hepatic Lab Results  Component Value Date   AST 24 01/19/2024   ALT 21 01/19/2024   ALBUMIN 4.4 01/19/2024   ALKPHOS 44 01/19/2024   LIPASE 56 (H) 11/14/2022    Electrolytes Lab Results  Component Value Date   NA 139 01/19/2024   K 4.1 01/19/2024   CL 105 01/19/2024    CALCIUM  9.8 01/19/2024    Bone Lab Results  Component Value Date   VD25OH 73.7 08/11/2023    Inflammation (CRP: Acute Phase) (ESR: Chronic Phase) No results found for: CRP, ESRSEDRATE, LATICACIDVEN       Note: Above Lab results reviewed.  Assessment  The primary encounter diagnosis was Chronic left shoulder pain. Diagnoses of Left rotator cuff tear arthropathy, Chronic pain syndrome, and Bilateral chronic knee pain were also pertinent to this visit.  Plan of Care  Problem-specific:  No problem-specific Assessment & Plan notes found for this encounter.  [name] has a current medication list which includes the following long-term medication(s): amlodipine , atorvastatin , calcium , famotidine , gabapentin , levothyroxine , linaclotide , omeprazole , triamterene -hydrochlorothiazide , and [DISCONTINUED] trazodone .  Patient is doing well in regards to her left shoulder after her left suprascapular nerve block.  She is now endorsing bilateral knee pain.  She states that she was working out at [employer] and may have rotated quickly and now is having continued pain in her knee.  She is utilizing a knee brace.  She has a history of left knee meniscus surgery which was arthroscopic.  Recommend diclofenac  trial for 15 days as below.  If this does not help we can discuss advanced imaging versus injections.  Patient in agreement with plan.  Pharmacotherapy (Medications Ordered): Meds ordered this encounter  Medications   diclofenac  (VOLTAREN ) 75 MG EC tablet    Sig: Take 1 tablet (75 mg total) by mouth 2 (two) times daily for 15 days.    Dispense:  30 tablet    Refill:  0   Orders:  No orders of the defined types were placed in this encounter.  Follow-up plan:   Return for PRN.      Right SI-J and Right Piriformis 04/17/21, 09/02/21. 11/12/22, RIGHT LFCN 06/17/23  Right glenohumeral joint injection  12/21/2023 Right suprascapular nerve block 02/10/2024 Left SSNB 03/30/24     Recent  Visits Date Type Provider Dept  03/30/24 Procedure visit Marcelino Nurse, MD [facility]  03/15/24 Office Visit Marcelino Nurse, MD [facility]  02/10/24 Procedure visit Marcelino Nurse, MD [facility]  02/09/24 Office Visit Marcelino Nurse, MD [facility]  Showing recent visits within past 90 days and meeting all other requirements Today's Visits Date Type Provider Dept  04/27/24 Office Visit Marcelino Nurse, MD [facility]  Showing today's visits and meeting all other requirements Future Appointments No visits were found meeting these conditions. Showing future appointments within next 90 days and meeting all other requirements  I discussed the assessment and treatment plan with the patient. The patient was provided an opportunity to ask questions and all were answered. The patient agreed with the plan and demonstrated an understanding of the instructions.  Patient advised to call back or seek an in-person evaluation if the symptoms or condition worsens.  Duration of encounter: .  Note by: Nurse Marcelino, MD Date: 04/27/2024; Time: 3:34 PM

## 2024-04-28 ENCOUNTER — Telehealth: Payer: Self-pay | Admitting: Gastroenterology

## 2024-04-28 NOTE — Telephone Encounter (Signed)
 Left message for patient to call back

## 2024-04-28 NOTE — Telephone Encounter (Signed)
 Patient calls requesting an appointment for chronic constipation. She states that she has had constipation for the last 2 years which has progressively gotten worse. She will be out of town from 8/25-8/31/25. She has been scheduled to see [name] on 05/27/24 at 3:30 pm for further evaluation/management.

## 2024-04-28 NOTE — Telephone Encounter (Signed)
 Inbound call to discuss symptoms she's having. She has extreme constipation constipation and would like to discuss options for relief. Please advise.

## 2024-04-28 NOTE — Telephone Encounter (Signed)
 Patient is returning a call back to [place]. Patient is requesting a call back. Please advise.

## 2024-04-29 ENCOUNTER — Ambulatory Visit: Admitting: Orthopaedic Surgery

## 2024-04-29 ENCOUNTER — Other Ambulatory Visit (INDEPENDENT_AMBULATORY_CARE_PROVIDER_SITE_OTHER): Payer: Self-pay

## 2024-04-29 DIAGNOSIS — M25562 Pain in left knee: Secondary | ICD-10-CM | POA: Diagnosis not present

## 2024-04-29 MED ORDER — BUPIVACAINE HCL 0.5 % IJ SOLN
2.0000 mL | INTRAMUSCULAR | Status: AC | PRN
  Administered 2024-04-29: 2 mL via INTRA_ARTICULAR

## 2024-04-29 MED ORDER — METHYLPREDNISOLONE ACETATE 40 MG/ML IJ SUSP
40.0000 mg | INTRAMUSCULAR | Status: AC | PRN
  Administered 2024-04-29: 40 mg via INTRA_ARTICULAR

## 2024-04-29 MED ORDER — LIDOCAINE HCL 1 % IJ SOLN
2.0000 mL | INTRAMUSCULAR | Status: AC | PRN
  Administered 2024-04-29: 2 mL
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
## 2024-05-06 ENCOUNTER — Telehealth: Payer: Self-pay

## 2024-05-06 NOTE — Telephone Encounter (Signed)
 Noted.  Jasmine Mascot, MD

## 2024-05-06 NOTE — Telephone Encounter (Signed)
 Copied from CRM [id number]. Topic: Clinical - Request for Lab/Test Order >> May 06, 2024 11:04 AM Franky GRADE wrote: Reason for CRM: Patient is calling because is experiencing left breast pain and would like an order for a mammography.

## 2024-05-10 ENCOUNTER — Ambulatory Visit

## 2024-05-24 ENCOUNTER — Other Ambulatory Visit: Payer: Self-pay | Admitting: Nurse Practitioner

## 2024-05-24 DIAGNOSIS — E039 Hypothyroidism, unspecified: Secondary | ICD-10-CM

## 2024-05-24 MED ORDER — LEVOTHYROXINE SODIUM 200 MCG PO TABS: 200.0000 ug | ORAL_TABLET | Freq: Every day | ORAL | 0 refills | Status: DC

## 2024-05-24 NOTE — Telephone Encounter (Signed)
 Copied from CRM #[id number]. Topic: Clinical - Medication Refill >> May 24, 2024 10:00 AM Berneda F wrote: Medication:  levothyroxine  (SYNTHROID ) 200 MCG tablet  90-day supply requested.  Has the patient contacted their pharmacy? Yes (Agent: If no, request that the patient contact the pharmacy for the refill. If patient does not wish to contact the pharmacy document the reason why and proceed with request.) (Agent: If yes, when and what did the pharmacy advise?)  This is the patient's preferred pharmacy:  [facility] [address] [place] - [address] [place] [address] Phone: [phone] Fax: [phone]  Is this the correct pharmacy for this prescription? Yes If no, delete pharmacy and type the correct one.   Has the prescription been filled recently? No  Is the patient out of the medication? No, has 3 left  Has the patient been seen for an appointment in the last year OR does the patient have an upcoming appointment? Yes  Can we respond through MyChart? Yes  Agent: Please be advised that Rx refills may take up to 3 business days. We ask that you follow-up with your pharmacy.

## 2024-05-25 ENCOUNTER — Ambulatory Visit (INDEPENDENT_AMBULATORY_CARE_PROVIDER_SITE_OTHER): Admitting: Nurse Practitioner

## 2024-05-25 VITALS — BP 108/76 | HR 77 | Temp 98.0°F | Ht 65.0 in | Wt 162.6 lb

## 2024-05-25 DIAGNOSIS — N644 Mastodynia: Secondary | ICD-10-CM | POA: Diagnosis not present

## 2024-05-25 DIAGNOSIS — K5900 Constipation, unspecified: Secondary | ICD-10-CM

## 2024-05-25 DIAGNOSIS — Z1231 Encounter for screening mammogram for malignant neoplasm of breast: Secondary | ICD-10-CM | POA: Diagnosis not present

## 2024-05-25 MED ORDER — LINACLOTIDE 145 MCG PO CAPS: 145.0000 ug | ORAL_CAPSULE | Freq: Every day | ORAL | Status: DC

## 2024-05-25 NOTE — Progress Notes (Signed)
 Medication Samples have been provided to the patient.  Drug name: Linzess        Strength: 145        Qty: 1  LOT: [id number] Exp.Date: 11/2024  Dosing instructions: 1 tablet daily   The patient has been instructed regarding the correct time, dose, and frequency of taking this medication, including desired effects and most common side effects.   [name] 8:42 AM 05/25/2024

## 2024-05-25 NOTE — Progress Notes (Signed)
 Leron Glance, NP-C Phone: [phone]  [name] is a 68 y.o. female who presents today for breast pain and constipation.   Discussed the use of AI scribe software for clinical note transcription with the patient, who gave verbal consent to proceed.  History of Present Illness   [name] is a 68 year old female who presents with left breast pain and constipation.  [name] experiences intermittent left breast pain, initially attributing it to physical activity. The pain was significant but resolved after about a week. [name] had similar pain in December, leading to an ultrasound and diagnostic mammogram, both of which were normal. [name] is overdue for a screening mammogram since March. [name] denies skin changes, nipple discharge, or lumps in the breast.  [name] describes significant constipation that has worsened over time. [name] uses three to four different laxatives, including Exlax, with irregular bowel movements sometimes occurring the day after use. Previously, [name] had regular bowel movements post-meals but now feels bloated and sometimes goes two days without a bowel movement. Over-the-counter remedies like central magnesia and enemas have limited success. [name] has not picked up her Linzess  prescription due to cost but has tried it before with good results. [name] is scheduled to see a gastroenterologist on Friday. No pain is associated with constipation, but bloating is present when not using the bathroom. [name] reports that [name] does push during bowel movements but tries not to strain excessively.  Her family history includes a sister recently diagnosed with Alzheimer's and a mother with dementia. A sister-in-law has a history of bowel issues and now has a colostomy bag. [name] had a colonoscopy in 2021, which found a few small issues, and was advised to repeat it in ten years.  [name] has reduced walking due to the heat but continues to work out. [name] drinks water regularly and is conscious of her fiber  intake. Miralax  causes loose stools with residue, which [name] dislikes.      Social History   Tobacco Use  Smoking Status Never  Smokeless Tobacco Never    Current Outpatient Medications on File Prior to Visit  Medication Sig Dispense Refill   amLODipine  (NORVASC ) 5 MG tablet Take 1 tablet (5 mg total) by mouth daily. 90 tablet 3   Aspirin  81 MG CAPS Take by mouth daily at 8 pm.     atorvastatin  (LIPITOR) 20 MG tablet Take 1 tablet (20 mg total) by mouth daily. 90 tablet 3   azithromycin  (ZITHROMAX ) 250 MG tablet Tale 500 mg PO on day 1, then 250 mg PO q24h x 4 days. 6 tablet 0   CALCIUM  PO Take 1 tablet by mouth daily.     Cyanocobalamin  (VITAMIN B 12 PO) Take 1 tablet by mouth daily at 6 (six) AM.     famotidine  (PEPCID ) 20 MG tablet TAKE 1 TABLET BY MOUTH AT BEDTIME 90 tablet 3   gabapentin  (NEURONTIN ) 300 MG capsule Take 1 capsule (300 mg total) by mouth 3 (three) times daily. Begin taking only one capsule at night x 1 week. If tolerating well, you may take one in the morning and one at night continue this for one week before adding in a third dose. 90 capsule 2   guaiFENesin -codeine  100-10 MG/5ML syrup Take 5 mLs by mouth at bedtime as needed for cough. 60 mL 0   levothyroxine  (SYNTHROID ) 200 MCG tablet Take 1 tablet (200 mcg total) by mouth daily. 90 tablet 0   meloxicam  (MOBIC ) 15 MG tablet Take 15 mg  by mouth daily.     Multiple Vitamin (MULTIVITAMIN PO) Take 1 tablet by mouth daily at 6 (six) AM.     naproxen  (NAPROSYN ) 500 MG tablet Take 500 mg by mouth 2 (two) times daily with a meal.     omeprazole  (PRILOSEC) 40 MG capsule TAKE 1 CAPSULE BY MOUTH ONCE DAILY BEFORE BREAKFAST 90 capsule 3   Probiotic Product (CULTURELLE PROBIOTICS PO) Take 1 tablet by mouth daily at 6 (six) AM.     triamterene -hydrochlorothiazide  (MAXZIDE-25) 37.5-25 MG tablet Take 0.5 tablets by mouth daily. 45 tablet 3   TURMERIC PO Take 2 capsules by mouth daily at 6 (six) AM.     VITAMIN D  PO Take 1 tablet by  mouth daily at 6 (six) AM.     [DISCONTINUED] traZODone  (DESYREL ) 50 MG tablet Take 0.5-1 tablets (25-50 mg total) by mouth at bedtime as needed for sleep. 90 tablet 3   No current facility-administered medications on file prior to visit.     ROS see history of present illness  Objective  Physical Exam Vitals:   05/25/24 0821  BP: 108/76  Pulse: 77  Temp: 98 F (36.7 C)  SpO2: 97%    BP Readings from Last 3 Encounters:  05/25/24 108/76  03/30/24 (!) 115/94  03/15/24 (!) 142/91   Wt Readings from Last 3 Encounters:  05/25/24 162 lb 9.6 oz (73.8 kg)  03/30/24 166 lb (75.3 kg)  03/15/24 168 lb (76.2 kg)    Physical Exam Constitutional:      General: [name] is not in acute distress.    Appearance: Normal appearance.  HENT:     Head: Normocephalic.  Cardiovascular:     Rate and Rhythm: Normal rate and regular rhythm.     Heart sounds: Normal heart sounds.  Pulmonary:     Effort: Pulmonary effort is normal.     Breath sounds: Normal breath sounds.  Abdominal:     General: Abdomen is flat. Bowel sounds are normal.     Palpations: Abdomen is soft.     Tenderness: There is no abdominal tenderness.  Skin:    General: Skin is warm and dry.  Neurological:     General: No focal deficit present.     Mental Status: [name] is alert.  Psychiatric:        Mood and Affect: Mood normal.        Behavior: Behavior normal.      Assessment/Plan: Please see individual problem list.  Constipation, unspecified constipation type Assessment & Plan: Chronic constipation with significant changes in bowel habits persists despite limited success with OTC laxatives, raising concern for underlying GI issues. Although a colonoscopy in 2021 warranted a 10-year follow-up, current symptoms require further evaluation. Provide a sample of Linzess  for temporary relief until the GI appointment. Encourage increased water and fiber intake. Advise follow-up with gastroenterology on Friday for further  evaluation.  Orders: -     linaCLOtide ; Take 1 capsule (145 mcg total) by mouth daily before breakfast.  Breast pain, left Assessment & Plan: Intermittent left breast pain was previously evaluated with a normal diagnostic mammogram and ultrasound. The pain has resolved, but [name] is overdue for a screening mammogram that was due in March. No current breast symptoms are present, and breast implants are not suspected to be leaking. Order a screening mammogram to ensure breast health maintenance. Provide contact information for scheduling the mammogram.    Screening mammogram for breast cancer -     3D Screening Mammogram w/Implants,  Left and Right; Future     Return for as scheduled.   Leron Glance, NP-C [facility]

## 2024-05-27 ENCOUNTER — Ambulatory Visit: Admitting: Nurse Practitioner

## 2024-06-08 NOTE — Assessment & Plan Note (Signed)
 Intermittent left breast pain was previously evaluated with a normal diagnostic mammogram and ultrasound. The pain has resolved, but she is overdue for a screening mammogram that was due in March. No current breast symptoms are present, and breast implants are not suspected to be leaking. Order a screening mammogram to ensure breast health maintenance. Provide contact information for scheduling the mammogram.

## 2024-06-08 NOTE — Assessment & Plan Note (Signed)
 Chronic constipation with significant changes in bowel habits persists despite limited success with OTC laxatives, raising concern for underlying GI issues. Although a colonoscopy in 2021 warranted a 10-year follow-up, current symptoms require further evaluation. Provide a sample of Linzess  for temporary relief until the GI appointment. Encourage increased water and fiber intake. Advise follow-up with gastroenterology on Friday for further evaluation.

## 2024-06-20 NOTE — Progress Notes (Deleted)
   ANNUAL EXAM Patient name: [name] MRN [id number]  Date of birth: Feb 03, 1956 Chief Complaint:   No chief complaint on file.  History of Present Illness:   [name] is a 68 y.o. [phone] female being seen today for a routine annual exam.   Current concerns: ***   No LMP recorded. Patient is postmenopausal.   Last MXR: 11/2022 - normal. Has next mxr scheduled for 06/24/24  Last Pap/Pap History:  H/O abnormal pap: {yes/yes***/no:23866} 12/2019: Pap normal 05/2023: pap/hpv wnl  Review of Systems:   Pertinent items are noted in HPI Denies any headaches, blurred vision, fatigue, shortness of breath, chest pain, abdominal pain, abnormal vaginal discharge/itching/odor/irritation, problems with periods, bowel movements, urination, or intercourse unless otherwise stated above. *** Pertinent History Reviewed:  Reviewed past medical,surgical, social and family history.  Reviewed problem list, medications and allergies. Physical Assessment:  There were no vitals filed for this visit.There is no height or weight on file to calculate BMI.   Physical Examination:  General appearance - well appearing, and in no distress Mental status - alert, oriented to person, place, and time Psych:  She has a normal mood and affect Skin - warm and dry, normal color, no suspicious lesions noted Chest - effort normal Heart - normal rate  Breasts - breasts appear normal, no suspicious masses, no skin or nipple changes or axillary nodes Abdomen - soft, nontender, nondistended, no masses or organomegaly Pelvic -  {Blank single:19197::Performed and:,declines,not indicated} VULVA: {Blank single:19197::Not examined,normal appearing vulva with no masses, tenderness or lesions,***} VAGINA: {Blank single:19197::Not examined,normal appearing vagina with normal color and discharge, no lesions,***} CERVIX: {Blank single:19197::Not examined,normal appearing cervix without discharge or  lesions, no CMT.,***} UTERUS: {Blank single:19197::Not examined,uterus is felt to be normal size, shape, consistency and nontender,***} ADNEXA: {Blank single:19197::Not examined,No adnexal masses or tenderness noted,***} Extremities:  No swelling or varicosities noted  Chaperone present for exam  No results found for this or any previous visit (from the past 24 hours).  Assessment & Plan:  Diagnoses and all orders for this visit:  Encounter for annual routine gynecological examination  - Cervical cancer screening: Discussed guidelines. Pap with HPV wnl 05/2023.  - Breast Health: Encouraged self breast awareness/SBE. Discussed limits of clinical breast exam for detecting breast cancer. Discussed importance of annual MXR. Scheduled for 06/24/24.  - Climacteric/Sexual health: Reviewed typical and atypical symptoms of menopause/peri-menopause. Discussed PMB and to call if any amount of spotting.  - Colonoscopy: Per PCP. Last colonoscopy 2021.  - F/U 12 months and prn     No orders of the defined types were placed in this encounter.   Meds: No orders of the defined types were placed in this encounter.   Follow-up: No follow-ups on file.  Vina Solian, MD 06/20/2024 3:38 PM

## 2024-06-23 ENCOUNTER — Encounter: Payer: Self-pay | Admitting: Cardiology

## 2024-06-23 ENCOUNTER — Ambulatory Visit: Admitting: Obstetrics and Gynecology

## 2024-06-23 ENCOUNTER — Ambulatory Visit: Attending: Cardiology | Admitting: Cardiology

## 2024-06-23 VITALS — BP 130/78 | HR 73 | Ht 64.0 in | Wt 162.0 lb

## 2024-06-23 DIAGNOSIS — R0789 Other chest pain: Secondary | ICD-10-CM

## 2024-06-23 DIAGNOSIS — Z8673 Personal history of transient ischemic attack (TIA), and cerebral infarction without residual deficits: Secondary | ICD-10-CM | POA: Diagnosis not present

## 2024-06-23 DIAGNOSIS — Z01419 Encounter for gynecological examination (general) (routine) without abnormal findings: Secondary | ICD-10-CM

## 2024-06-23 DIAGNOSIS — K219 Gastro-esophageal reflux disease without esophagitis: Secondary | ICD-10-CM

## 2024-06-23 DIAGNOSIS — E039 Hypothyroidism, unspecified: Secondary | ICD-10-CM | POA: Diagnosis not present

## 2024-06-23 DIAGNOSIS — I10 Essential (primary) hypertension: Secondary | ICD-10-CM | POA: Diagnosis not present

## 2024-06-23 NOTE — Patient Instructions (Signed)
 Medication Instructions:  Your physician recommends that you continue on your current medications as directed. Please refer to the Current Medication list given to you today.  *If you need a refill on your cardiac medications before your next appointment, please call your pharmacy*  Lab Work: No labs ordered today  If you have labs (blood work) drawn today and your tests are completely normal, you will receive your results only by: MyChart Message (if you have MyChart) OR A paper copy in the mail If you have any lab test that is abnormal or we need to change your treatment, we will call you to review the results.  Testing/Procedures: No test ordered today   Follow-Up: At [facility], you and your health needs are our priority.  As part of our continuing mission to provide you with exceptional heart care, our providers are all part of one team.  This team includes your primary Cardiologist (physician) and Advanced Practice Providers or APPs (Physician Assistants and Nurse Practitioners) who all work together to provide you with the care you need, when you need it.  Your next appointment:   6 month(s)  Provider:   You may see  one of the following Advanced Practice Providers on your designated Care Team:   Laneta Pintos, NP Gildardo Labrador, PA-C Varney Gentleman, PA-C Cadence Kingston, PA-C Ronald Cockayne, NP Morey Ar, NP

## 2024-06-23 NOTE — Progress Notes (Signed)
 Cardiology Office Note   Date:  06/23/2024  ID:  Jasmine, [name] 01-24-1956, MRN [id number] PCP: Gretel App, NP  [facility] Providers Cardiologist:  None Cardiology APP:  Gerard Frederick, NP     History of Present Illness [name] is a 68 y.o. female with a past medical history of hypertension, hypothyroidism, arthritis, depression/anxiety, palpitations, Lacunar stroke, was here today for follow-up.   September 2023 after MRI of the brain showed remote clinic or infarct with minimal chronic microvascular changes. She was asymptomatic at that time but continued to have weakness and decreased range of motion following an ACDF. Prior echocardiogram completed 07/17/2022 revealed an EF of 60 to 65%, no RWMA, G1 DD, left atrial size with mildly dilated, and mild mitral valve regurgitation with negative bubble study. Event monitor revealed rare PACs and PVCs and few episodes of brief PSVT. There was no atrial fibrillation/flutter was observed. She was continued on her current medication regimen.   She was seen in clinic in February 2024 by Dr. Mady.  Been experience chest discomfort that began 5 days prior.  The pain persisted for several hours despite Tums and aspirin  prompted her to summon EMS.  EMS evaluation was unremarkable she declined transport to the emergency department.  She underwent upper endoscopy 2 days later which was unremarkable.  She continued to complain of chest pain 710 was administered aspirin  325 mg in the office and was transferred to the emergency department for further evaluation.  She presented to the emergency emergency department from the office with stable vitals.  Imaging was found to be reassuring.  Troponins were normal.  She denied any pain or discomfort.  She was able to be discharged home and was advised to return to the ED for recurrent symptoms.   She was last seen in clinic in April 2025 and overall was doing well.  She had been following up with  her GI provider as she had had a large quantity of reflux symptoms.  She been compliant with her current medication regimen.  No further testing was ordered and no other medication changes were made.  She returns to clinic today stating that overall she has been doing well.  She has a couple episodes of acid reflux.  She states that she has a follow-up with gastro if she is having problems with swallowing again.  She states that she had stopped taking her cholesterol medicine and her triamterene -HCTZ because someone told her that both his medications were from on her kidneys.  She denies any shortness of breath, peripheral edema or palpitations.  Denies any hospitalizations or visits to the emergency department  ROS: 10 point review of systems has been reviewed and considered negative except was been listed in the HPI  Studies Reviewed EKG Interpretation Date/Time:  Thursday June 23 2024 13:31:27 EDT Ventricular Rate:  73 PR Interval:  156 QRS Duration:  72 QT Interval:  414 QTC Calculation: 456 R Axis:   30  Text Interpretation: Normal sinus rhythm Normal ECG When compared with ECG of 23-Dec-2023 10:44, No significant change was found Confirmed by Gerard Frederick ([id number]) on 06/23/2024 1:33:57 PM    Coronary CTA 12/04/22 IMPRESSION: 1. Normal coronary calcium  score of 0. Patient is low risk.   2. Normal coronary origin with right dominance.   3. No evidence of CAD.   4. CAD-RADS 0. Consider non-atherosclerotic causes of chest pain.   EP Procedures and Devices: Event monitor (06/13/2022): Patient was monitored for 11 days,  23 hours.  Predominantly sinus rhythm with rare PACs and PVCs as well as a few brief episodes of PSVT lasting up to 8 beats.  No evidence of atrial fibrillation/flutter.   Non-Invasive Evaluation(s): TTE with bubble study (07/17/2022): Normal LV size with borderline LVH.  LVEF 60-65% with normal wall motion.  Grade 1 diastolic dysfunction.  Normal RV size and  function.  Mild left atrial enlargement.  Mild mitral regurgitation.  Otherwise, no significant valvular abnormality.  CVP 3.  Bubble study negative for interatrial shunt. Risk Assessment/Calculations           Physical Exam VS:  BP 130/78 (BP Location: Left Arm, Patient Position: Sitting, Cuff Size: Normal)   Pulse 73   Ht 5' 4 (1.626 m)   Wt 162 lb (73.5 kg)   SpO2 98%   BMI 27.81 kg/m        Wt Readings from Last 3 Encounters:  06/23/24 162 lb (73.5 kg)  05/25/24 162 lb 9.6 oz (73.8 kg)  03/30/24 166 lb (75.3 kg)    GEN: Well nourished, well developed in no acute distress NECK: No JVD; No carotid bruits CARDIAC: RRR, no murmurs, rubs, gallops RESPIRATORY:  Clear to auscultation without rales, wheezing or rhonchi  ABDOMEN: Soft, non-tender, non-distended EXTREMITIES:  No edema; No deformity   ASSESSMENT AND PLAN Primary hypertension with blood pressure 130/58.  She is continued on amlodipine  5 mg daily and triamterene -HCTZ 37.5 to 25 mg half a tablet twice daily.  She has been encouraged to monitor pressure 1 to 2 hours postmedication administration at home as well.  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  CT of the head and neck did not show cervical intracranial stenosis.  Study was negative.  She is continued on aspirin  81 mg daily and atorvastatin  20 mg daily.  Hypothyroidism where she is continued on levothyroxine  with ongoing management by her PCP.  History of atypical chest pain that she relates to her gastroesophageal reflux.  Prior coronary CTA was completed in March 2024 revealed a calcium  score of 0.  EKG today reveals sinus rhythm with rate of 73 with no significant ischemic changes.  No further ischemic evaluation is needed at this time.  GERD she continues to have complaints of her reflux symptoms.  She has been encouraged to continue with her acid reducing medication and to keep her follow-up with her GI provider.        Dispo: Patient is to return to clinic to see MD/APP in 6 months or sooner if needed for reevaluation.  Signed, Faraaz Wolin, NP

## 2024-06-24 ENCOUNTER — Other Ambulatory Visit: Payer: Self-pay | Admitting: Nurse Practitioner

## 2024-06-24 ENCOUNTER — Ambulatory Visit
Admission: RE | Admit: 2024-06-24 | Discharge: 2024-06-24 | Disposition: A | Discharge status: Home / Self Care | Source: Ambulatory Visit | Attending: Nurse Practitioner | Admitting: Nurse Practitioner

## 2024-06-24 DIAGNOSIS — I10 Essential (primary) hypertension: Secondary | ICD-10-CM

## 2024-06-24 DIAGNOSIS — Z1231 Encounter for screening mammogram for malignant neoplasm of breast: Secondary | ICD-10-CM | POA: Diagnosis not present

## 2024-06-25 ENCOUNTER — Encounter: Payer: Self-pay | Admitting: Nurse Practitioner

## 2024-06-25 DIAGNOSIS — I10 Essential (primary) hypertension: Secondary | ICD-10-CM

## 2024-06-27 MED ORDER — AMLODIPINE BESYLATE 5 MG PO TABS: 5.0000 mg | ORAL_TABLET | Freq: Every day | ORAL | 3 refills | Status: AC

## 2024-06-28 ENCOUNTER — Ambulatory Visit: Payer: Self-pay | Admitting: Nurse Practitioner

## 2024-07-04 ENCOUNTER — Other Ambulatory Visit: Payer: Self-pay | Admitting: Nurse Practitioner

## 2024-07-04 DIAGNOSIS — I6381 Other cerebral infarction due to occlusion or stenosis of small artery: Secondary | ICD-10-CM

## 2024-07-05 ENCOUNTER — Ambulatory Visit: Admitting: Obstetrics and Gynecology

## 2024-07-08 NOTE — Progress Notes (Signed)
 [name]                                          MRN: [id number]   07/08/2024   The [facility] Quality Team Specialist reviewed this patient medical record for the purposes of chart review for care gap closure. The following were reviewed: abstraction for care gap closure-controlling blood pressure.    [facility] Quality Team

## 2024-07-13 DIAGNOSIS — K080 Exfoliation of teeth due to systemic causes: Secondary | ICD-10-CM | POA: Diagnosis not present

## 2024-07-20 ENCOUNTER — Ambulatory Visit: Admitting: Nurse Practitioner

## 2024-07-20 DIAGNOSIS — K080 Exfoliation of teeth due to systemic causes: Secondary | ICD-10-CM | POA: Diagnosis not present

## 2024-07-25 ENCOUNTER — Encounter: Payer: Self-pay | Admitting: Radiology

## 2024-07-28 ENCOUNTER — Ambulatory Visit: Admitting: Nurse Practitioner

## 2024-07-28 ENCOUNTER — Encounter: Payer: Self-pay | Admitting: Student in an Organized Health Care Education/Training Program

## 2024-07-28 ENCOUNTER — Encounter: Payer: Self-pay | Admitting: Nurse Practitioner

## 2024-07-28 ENCOUNTER — Ambulatory Visit
Attending: Student in an Organized Health Care Education/Training Program | Admitting: Student in an Organized Health Care Education/Training Program

## 2024-07-28 VITALS — BP 100/58 | HR 87 | Ht 65.0 in | Wt 162.5 lb

## 2024-07-28 VITALS — BP 98/71 | HR 77 | Temp 97.0°F | Resp 16 | Ht 65.0 in | Wt 160.0 lb

## 2024-07-28 DIAGNOSIS — M533 Sacrococcygeal disorders, not elsewhere classified: Secondary | ICD-10-CM | POA: Insufficient documentation

## 2024-07-28 DIAGNOSIS — G5681 Other specified mononeuropathies of right upper limb: Secondary | ICD-10-CM | POA: Insufficient documentation

## 2024-07-28 DIAGNOSIS — G8929 Other chronic pain: Secondary | ICD-10-CM | POA: Insufficient documentation

## 2024-07-28 DIAGNOSIS — G5701 Lesion of sciatic nerve, right lower limb: Secondary | ICD-10-CM | POA: Diagnosis not present

## 2024-07-28 DIAGNOSIS — M461 Sacroiliitis, not elsewhere classified: Secondary | ICD-10-CM | POA: Insufficient documentation

## 2024-07-28 DIAGNOSIS — M19111 Post-traumatic osteoarthritis, right shoulder: Secondary | ICD-10-CM | POA: Diagnosis not present

## 2024-07-28 DIAGNOSIS — K5900 Constipation, unspecified: Secondary | ICD-10-CM | POA: Diagnosis not present

## 2024-07-28 DIAGNOSIS — S46001S Unspecified injury of muscle(s) and tendon(s) of the rotator cuff of right shoulder, sequela: Secondary | ICD-10-CM | POA: Diagnosis not present

## 2024-07-28 DIAGNOSIS — R1310 Dysphagia, unspecified: Secondary | ICD-10-CM | POA: Diagnosis not present

## 2024-07-28 MED ORDER — LUBIPROSTONE 24 MCG PO CAPS: 24.0000 ug | ORAL_CAPSULE | Freq: Two times a day (BID) | ORAL | 1 refills | Status: DC

## 2024-07-28 NOTE — Progress Notes (Signed)
 PROVIDER NOTE: Interpretation of information contained herein should be left to medically-trained personnel. Specific patient instructions are provided elsewhere under Patient Instructions section of medical record. This document was created in part using AI and STT-dictation technology, any transcriptional errors that may result from this process are unintentional.  Patient: [name]  Service: E/M   PCP: Gretel App, NP  DOB: 1956/06/21  DOS: 07/28/2024  Provider: Wallie Sherry, MD  MRN: [id number]  Delivery: Face-to-face  Specialty: Interventional Pain Management  Type: Established Patient  Setting: Ambulatory outpatient facility  Specialty designation: 09  Referring Prov.: Gretel App, NP  Location: Outpatient office facility       History of present illness (HPI) [name], a 68 y.o. 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 [name] primary complain today is Neck Pain (Right ), Shoulder Pain (Right ), Back Pain (Right ), and Hip Pain (Right )  Pertinent problems: Ms. Somarriba does not have any pertinent problems on file.  Pain Assessment: Severity of Chronic pain is reported as a 8 /10. Location: Neck Posterior, Right/radiates to right shoulder. Onset: More than a month ago. Quality: Constant, Aching, Tender (Pulling and stifness). Timing: Constant. Modifying factor(s): heat and pillows when resting. Vitals:  height is 5' 5 (1.651 m) and weight is 160 lb (72.6 kg). Her temporal temperature is 97 F (36.1 C) (abnormal). Her blood pressure is 98/71 and her pulse is 77. Her respiration is 16 and oxygen saturation is 100%.  BMI: Estimated body mass index is 26.63 kg/m as calculated from the following:   Height as of this encounter: 5' 5 (1.651 m).   Weight as of this encounter: 160 lb (72.6 kg).  Last encounter: 04/27/2024. Last procedure: 03/30/2024.  Reason for encounter: follow-up  evaluation.   Discussed the use of AI scribe software for clinical note transcription with the patient, who gave verbal consent to proceed.  History of Present Illness   [name] is a 68 year old female who presents with chronic pain symptoms.  She experiences persistent pain in her lower back and neck, with the neck pain being particularly severe recently, causing sensitivity to touch. The pain is primarily on the right side, which she describes as her 'bad side'.  She previously had a right suprascapular nerve block, right SI joint injection and right piriformis injection which she found very helpful, providing 75% pain relief for over 3 months.  Given return of pain in these regions we discussed repeating these procedures in the management of suprascapular nerve entrapment resulting in right shoulder pain and right SI joint arthritis and piriformis syndrome       ROS  Constitutional: Denies any fever or chills Gastrointestinal: No reported hemesis, hematochezia, vomiting, or acute GI distress Musculoskeletal: Right shoulder pain, right piriformis pain Neurological: No reported episodes of acute onset apraxia, aphasia, dysarthria, agnosia, amnesia, paralysis, loss of coordination, or loss of consciousness  Medication Review  Aspirin , Cyanocobalamin , Turmeric, Vitamin D , amLODipine , atorvastatin , levothyroxine , and triamterene -hydrochlorothiazide   History Review  Allergy: [name] has no known allergies. Drug: [name]  reports no history of drug use. Alcohol:  reports current alcohol use of about 3.0 standard drinks of alcohol per week. Tobacco:  reports that she has never smoked. She has never used smokeless tobacco. Social: Ms. Thomason  reports that she has never smoked. She has never used smokeless tobacco. She reports current alcohol use of  about 3.0 standard drinks of alcohol per week. She reports that she does not use drugs. Medical:  has a past medical history of  Anemia, Anxiety, Arthritis, Bronchitis, COVID-19 (07/08/2019), Depression, GERD (gastroesophageal reflux disease), Hypertension, Hypothyroidism, Mini stroke, Osteoporosis, PTSD (post-traumatic stress disorder), Stroke (HCC) (2023), and Wears glasses. Surgical: [name]  has a past surgical history that includes Back surgery; Breast surgery; Augmentation mammaplasty (Bilateral, 2006); Esophagogastroduodenoscopy (egd) with propofol  (N/A, 10/24/2020); Esophagogastroduodenoscopy (egd) with propofol  (N/A, 12/20/2020); Esophageal manometry (N/A, 03/04/2021); 24 hour ph study (N/A, 03/04/2021); PH impedance study (N/A, 03/04/2021); Vein Surgery (1991); Upper gastrointestinal endoscopy; Colonoscopy; Anterior cervical decomp/discectomy fusion (N/A, 05/12/2022); and Esophagogastroduodenoscopy (egd) with propofol  (N/A, 11/11/2022). Family: family history includes Cancer in her father and sister; Colon cancer in her sister; Dementia in her mother; Diabetes in her father; Diabetes Mellitus I in her son; Heart Problems in her father; Heart disease in her father, mother, sister, and sister; Hypertension in her mother; Lung cancer in her maternal uncle; Osteoporosis in her mother; Ovarian cancer in her sister; Sickle cell trait in her daughter; Skin cancer in an other family member; Vitiligo in her mother and son.  Laboratory Chemistry Profile   Renal Lab Results  Component Value Date   BUN 19 01/19/2024   CREATININE 0.76 01/19/2024   GFR 81.94 01/19/2024   GFRAA >60 10/04/2019   GFRNONAA >60 11/14/2022    Hepatic Lab Results  Component Value Date   AST 24 01/19/2024   ALT 21 01/19/2024   ALBUMIN 4.4 01/19/2024   ALKPHOS 44 01/19/2024   LIPASE 56 (H) 11/14/2022    Electrolytes Lab Results  Component Value Date   NA 139 01/19/2024   K 4.1 01/19/2024   CL 105 01/19/2024   CALCIUM  9.8 01/19/2024    Bone Lab Results  Component Value Date   VD25OH 73.7 08/11/2023    Inflammation (CRP: Acute Phase)  (ESR: Chronic Phase) No results found for: CRP, ESRSEDRATE, LATICACIDVEN       Note: Above Lab results reviewed.  Recent Imaging Review  MM 3D SCREENING MAMMOGRAM BILATERAL BREAST W/IMPLANT CLINICAL DATA:  Screening.  EXAM: DIGITAL SCREENING BILATERAL MAMMOGRAM WITH IMPLANTS, CAD AND TOMOSYNTHESIS  TECHNIQUE: Bilateral screening digital craniocaudal and mediolateral oblique mammograms were obtained. Bilateral screening digital breast tomosynthesis was performed. The images were evaluated with computer-aided detection. Standard and/or implant displaced views were performed.  COMPARISON:  Previous exam(s).  ACR Breast Density Category b: There are scattered areas of fibroglandular density.  FINDINGS: The patient has prepectoral implants. There are no findings suspicious for malignancy.  IMPRESSION: No mammographic evidence of malignancy. A result letter of this screening mammogram will be mailed directly to the patient.  RECOMMENDATION: Screening mammogram in one year. (Code:[id number]-B-01Y)  BI-RADS CATEGORY  1: Negative.  Electronically Signed   By: Corean Salter M.D.   On: 06/28/2024 15:47 Note: Reviewed        Physical Exam  Vitals: BP 98/71 (Patient Position: Sitting, Cuff Size: Normal)   Pulse 77   Temp (!) 97 F (36.1 C) (Temporal)   Resp 16   Ht 5' 5 (1.651 m)   Wt 160 lb (72.6 kg)   SpO2 100%   BMI 26.63 kg/m  BMI: Estimated body mass index is 26.63 kg/m as calculated from the following:   Height as of this encounter: 5' 5 (1.651 m).   Weight as of this encounter: 160 lb (72.6 kg). Ideal: Ideal body weight: 57 kg (125 lb 10.6 oz) Adjusted ideal body weight: 63.2  kg (139 lb 6.4 oz) General appearance: Well nourished, well developed, and well hydrated. In no apparent acute distress Mental status: Alert, oriented x 3 (person, place, & time)       Respiratory: No evidence of acute respiratory distress Eyes: PERLA  Right shoulder pain, right  shoulder range of motion limited.   Lumbar Spine Area Exam  Skin & Axial Inspection: No masses, redness, or swelling Alignment: Symmetrical Functional ROM: Unrestricted ROM       Stability: No instability detected Muscle Tone/Strength: Functionally intact. No obvious neuro-muscular anomalies detected. Sensory (Neurological): Musculoskeletal pain pattern  Patrick's Maneuver: (+) for right-sided S-I arthralgia             FABER* test: (+) for right-sided S-I arthralgia             S-I anterior distraction/compression test: (+)   S-I arthralgia/arthropathy S-I lateral compression test: Positive for   S-I arthralgia/arthropathy S-I Thigh-thrust test:Positive for   S-I arthralgia/arthropathy     S-I Gaenslen's test: Positive for   S-I arthralgia/arthropathy      Gait & Posture Assessment  Ambulation: Unassisted Gait: Relatively normal for age and body habitus Posture: WNL  Lower Extremity Exam      Side: Right lower extremity   Side: Left lower extremity  Stability: No instability observed           Stability: No instability observed          Skin & Extremity Inspection:right foot in boot 2/2 to toe fracture   Skin & Extremity Inspection: Skin color, temperature, and hair growth are WNL. No peripheral edema or cyanosis. No masses, redness, swelling, asymmetry, or associated skin lesions. No contractures.  Functional ROM: pain restricted ROM of right foot                  Functional ROM: Unrestricted ROM                  Muscle Tone/Strength: Functionally intact. No obvious neuro-muscular anomalies detected.   Muscle Tone/Strength: Functionally intact. No obvious neuro-muscular anomalies detected.  Sensory (Neurological): MSK        Sensory (Neurological): Unimpaired        DTR: Patellar: deferred today Achilles: deferred today Plantar: deferred today   DTR: Patellar: deferred today Achilles: deferred today Plantar: deferred today  Palpation: No palpable anomalies   Palpation: No  palpable anomalies     Assessment   Diagnosis Status  1. 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 Suprascapular nerve entrapment, right   3. SI joint arthritis   4. Chronic right SI joint pain   5. 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 A previous suprascapular nerve block was effective. She reports sensitivity and lack of muscle activation on the right side, likely from nerve damage. Schedule a right suprascapular nerve block with Valium  for pain management on Wednesday afternoon around 2 PM.  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 Previous SI joint injections provided relief. Repeat SI joint injection for pain management.  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 Previous trigger point and joint injections had variable effectiveness. Consider acupuncture as a low-risk adjunctive therapy for pain management.       Ms. KRYSTINA STRIETER has a current medication list which includes the following long-term medication(s): amlodipine , atorvastatin , levothyroxine , and triamterene -hydrochlorothiazide .  Pharmacotherapy (Medications Ordered): No orders of the defined types were placed in this encounter.  Orders:  Orders Placed This Encounter  Procedures   SUPRASCAPULAR NERVE BLOCK    For shoulder pain.    Standing Status:   Future    Expected Date:   08/03/2024    Expiration Date:   07/28/2025    Scheduling Instructions:      Purpose: Right      Level(s): Suprascapular notch     Sedation: PO Valium       Scheduling Timeframe: As permitted by the schedule    Where will this procedure be performed?:   [facility]   SACROILIAC JOINT INJECTION    Physical Examination Findings: Positive Sacral Thrust (Sacral Spring, Downward Pressure): (Y) Positive FABER maneuver (Patrick's): (Y) Positive SI distraction (Gapping): (Y) Positive SI compression (Approximation): (Y) Positive Thigh Thrust:  (Y) Positive Gaenslen's: (Y) Positive Sacral Sulcus Tenderness: (Y)    Standing Status:   Future    Expiration Date:   10/28/2024    Scheduling Instructions:     Procedure: Sacroiliac Joint Injection and Right Piriformis TPI and lumbar TPI     Side  Laterality: RIGHT     Sedation: Po Valium      Timeframe: As soon as schedule allows.    Where will this procedure be performed?:   [facility]     Right SI-J and Right Piriformis 04/17/21, 09/02/21. 11/12/22, RIGHT LFCN 06/17/23  Right glenohumeral joint injection 12/21/2023 Right suprascapular nerve block 02/10/2024 Left SSNB 03/30/24    Return in about 6 days (around 08/03/2024) for Right SSNB + Right SIJ+ Right Piriformis and Lumbar TPI, in clinic (PO Valium  5mg ).    Recent Visits No visits were found meeting these conditions. Showing recent visits within past 90 days and meeting all other requirements Today's Visits Date Type Provider Dept  07/28/24 Office Visit Marcelino Nurse, MD [facility]  Showing today's visits and meeting all other requirements Future Appointments Date Type Provider Dept  08/03/24 Appointment Marcelino Nurse, MD [facility]  Showing future appointments within next 90 days and meeting all other requirements  I discussed the assessment and treatment plan with the patient. The patient was provided an opportunity to ask questions and all were answered. The patient agreed with the plan and demonstrated an understanding of the  instructions.  Patient advised to call back or seek an in-person evaluation if the symptoms or condition worsens.  I personally spent a total of 30 minutes in the care of the patient today including preparing to see the patient, getting/reviewing separately obtained history, performing a medically appropriate exam/evaluation, counseling and educating, placing orders, and documenting clinical information in the EHR.   Note by: Nurse Marcelino, MD (TTS and AI technology used. I apologize for any typographical errors that were not detected and corrected.) Date: 07/28/2024; Time: 11:02 AM

## 2024-07-28 NOTE — Patient Instructions (Signed)

## 2024-07-28 NOTE — Progress Notes (Unsigned)
 07/28/2024 [name] [id number] 11/27/1955   Chief Complaint: Constipation   History of Present Illness: history of CVA, LV EF 60 - 65%, grade 1 diastolic dysfunction, dysphagia, hypothyroidism, constipation and colon polyps. S/P 05/12/2022 C5-7 anterior cervical discectomy and fusion.   She takes Ex-lax daily. Two or three tabs.   Mag 7 take one and 2 Dulcolax  Miralax  once daily, wipes a lot  Now not going daily. Duclax might go next day.   No BM today, took Dulcolax 2 or 3 Pm   Linzess  and could not afford. July.   No bloody  Food goes down rough, chicken, meat,  bread, pizza. Gets up and spits   EGD sx good 6 to 7 months      03/2020 colonoscopy with Dr. Inocente Hausen, prep adequate, 4 mm polyp removed from the rectum and internal hemorrhoids.  Polyp was benign polypoid rectal mucosa.  Possible family history of colon cancer in her sister.  Possible repeat in 5 years.         12/05/2021 office visit with Dr. Leigh to discuss dysphagia, GERD and constipation.  At that time noted she had manometry which showed increased UES pressure and repeat EGD was recommended with empiric dilation.  Her PPI was working to control reflux.  Also discussed severe constipation.  Previously was given Linzess  and could not afford.  Recommended a MiraLAX  bowel purge and then double dose MiraLAX  twice daily and titrate as needed.  Her colonoscopy was up-to-date.    01/10/2022 EGD for dysphagia (prior evaluation with EGD x 2, barium swallow, [place] without clear cause) with normal esophagus but empiric dilation performed and biopsies taken to rule out EOE, normal stomach and normal duodenum.  Biopsies were normal.  EGD 07/20/2023: - Z-line regular. - 1 cm hiatal hernia. - Normal esophagus otherwise - empiric dilation performed to 18mm. - Normal stomach. - Normal examined duodenum.  Sister had colon or rectal cancer. Sister with ovarian cancer.       Latest Ref Rng & Units  01/19/2024   10:48 AM 06/09/2023    2:32 PM 11/14/2022    1:57 PM  CBC  WBC 4.0 - 10.5 K/uL 4.3  5.2  6.4   Hemoglobin 12.0 - 15.0 g/dL 87.0  87.3  87.2   Hematocrit 36.0 - 46.0 % 38.6  38.6  38.8   Platelets 150.0 - 400.0 K/uL 236.0  242.0  238        Latest Ref Rng & Units 01/19/2024   10:48 AM 12/08/2023    1:28 PM 06/09/2023    2:32 PM  CMP  Glucose 70 - 99 mg/dL 93  91  88   BUN 6 - 23 mg/dL 19  18  18    Creatinine 0.40 - 1.20 mg/dL 9.23  9.27  9.18   Sodium 135 - 145 mEq/L 139  139  137   Potassium 3.5 - 5.1 mEq/L 4.1  4.6  4.2   Chloride 96 - 112 mEq/L 105  105  103   CO2 19 - 32 mEq/L 30  27  27    Calcium  8.4 - 10.5 mg/dL 9.8  89.9  9.5   Total Protein 6.0 - 8.3 g/dL 7.3  7.7  7.2   Total Bilirubin 0.2 - 1.2 mg/dL 0.5  0.5  0.5   Alkaline Phos 39 - 117 U/L 44  65  55   AST 0 - 37 U/L 24  26  23    ALT 0 -  35 U/L 21  21  18        Current Medications, Allergies, Past Medical History, Past Surgical History, Family History and Social History were reviewed in [employer] record.   Review of Systems:   Constitutional: Negative for fever, sweats, chills or weight loss.  Respiratory: Negative for shortness of breath.   Cardiovascular: Negative for chest pain, palpitations and leg swelling.  Gastrointestinal: See HPI.  Musculoskeletal: Negative for back pain or muscle aches.  Neurological: Negative for dizziness, headaches or paresthesias.    Physical Exam: There were no vitals taken for this visit. General: in no acute distress. Head: Normocephalic and atraumatic. Eyes: No scleral icterus. Conjunctiva pink . Ears: Normal auditory acuity. Mouth: Dentition intact. No ulcers or lesions.  Lungs: Clear throughout to auscultation. Heart: Regular rate and rhythm, no murmur. Abdomen: Soft, nontender and nondistended. No masses or hepatomegaly. Normal bowel sounds x 4 quadrants.  Rectal: *** Musculoskeletal: Symmetrical with no gross  deformities. Extremities: No edema. Neurological: Alert oriented x 4. No focal deficits.  Psychological: Alert and cooperative. Normal mood and affect  Assessment and Recommendations: ***

## 2024-07-28 NOTE — Patient Instructions (Addendum)
 We have sent the following medications to your pharmacy for you to pick up at your convenience: Amitiza 24mcg, take 1 tablet twice a day.   You have been scheduled for a endoscopy. Please follow written instructions given to you at your visit today.   If you use inhalers (even only as needed), please bring them with you on the day of your procedure.  DO NOT TAKE 7 DAYS PRIOR TO TEST- Trulicity (dulaglutide) Ozempic, Wegovy  (semaglutide ) Mounjaro, Zepbound (tirzepatide) Bydureon Bcise (exanatide extended release)  DO NOT TAKE 1 DAY PRIOR TO YOUR TEST Rybelsus (semaglutide ) Adlyxin (lixisenatide) Victoza (liraglutide) Byetta (exanatide) ___________________________________________________________________________ Thank you for trusting me with your gastrointestinal care!   Elida Shawl, CRNP   _______________________________________________________  If your blood pressure at your visit was 140/90 or greater, please contact your primary care physician to follow up on this.  _______________________________________________________  If you are age 38 or older, your body mass index should be between 23-30. Your Body mass index is 27.04 kg/m. If this is out of the aforementioned range listed, please consider follow up with your Primary Care Provider.  If you are age 60 or younger, your body mass index should be between 19-25. Your Body mass index is 27.04 kg/m. If this is out of the aformentioned range listed, please consider follow up with your Primary Care Provider.   ________________________________________________________  The [facility] providers would like to encourage you to use MYCHART to communicate with providers for non-urgent requests or questions.  Due to long hold times on the telephone, sending your provider a message by [facility] may be a faster and more efficient way to get a response.  Please allow 48 business hours for a response.  Please remember that this is  for non-urgent requests.  _______________________________________________________  [facility] is using a team-based approach to care.  Your team is made up of your doctor and two to three APPS. Our APPS (Nurse Practitioners and Physician Assistants) work with your physician to ensure care continuity for you. They are fully qualified to address your health concerns and develop a treatment plan. They communicate directly with your gastroenterologist to care for you. Seeing the Advanced Practice Practitioners on your physician's team can help you by facilitating care more promptly, often allowing for earlier appointments, access to diagnostic testing, procedures, and other specialty referrals.

## 2024-07-28 NOTE — Progress Notes (Signed)
 Safety precautions to be maintained throughout the outpatient stay will include: orient to surroundings, keep bed in low position, maintain call bell within reach at all times, provide assistance with transfer out of bed and ambulation.

## 2024-07-29 ENCOUNTER — Other Ambulatory Visit (HOSPITAL_COMMUNITY): Payer: Self-pay

## 2024-07-29 NOTE — Telephone Encounter (Signed)
 Contacted pharmacy to double check information. Technician confirmed that insurance is being applied to the medication when processing. A 30 day supply (#60 capsules) is $214.68. It's very possible, because patient has Medicare, that she has not reached the out of pocket before they pay more or there may be a deductible of some kind. I am unable to see this information. Tech did apply a goodrx card to see if it would be a better price and it brought it down to $87.95.

## 2024-07-29 NOTE — Progress Notes (Signed)
 Agree with assessment and plan as outlined.  If she has reliably responded to dilation in the past, then can proceed with EGD and empiric dilation.  In regards to colonoscopy, there is a ? family history of colon cancer - if she cannot clarify, can keep on a 5 year recall and do it next year. Thanks

## 2024-08-03 ENCOUNTER — Ambulatory Visit: Admitting: Student in an Organized Health Care Education/Training Program

## 2024-08-03 ENCOUNTER — Ambulatory Visit
Admission: RE | Admit: 2024-08-03 | Discharge: 2024-08-03 | Disposition: A | Discharge status: Home / Self Care | Source: Ambulatory Visit | Attending: Student in an Organized Health Care Education/Training Program | Admitting: Student in an Organized Health Care Education/Training Program

## 2024-08-03 ENCOUNTER — Encounter: Payer: Self-pay | Admitting: Student in an Organized Health Care Education/Training Program

## 2024-08-03 VITALS — BP 119/74 | HR 72 | Temp 97.3°F | Resp 16 | Ht 65.0 in | Wt 160.0 lb

## 2024-08-03 DIAGNOSIS — M461 Sacroiliitis, not elsewhere classified: Secondary | ICD-10-CM

## 2024-08-03 DIAGNOSIS — M19011 Primary osteoarthritis, right shoulder: Secondary | ICD-10-CM | POA: Diagnosis not present

## 2024-08-03 DIAGNOSIS — M533 Sacrococcygeal disorders, not elsewhere classified: Secondary | ICD-10-CM | POA: Diagnosis not present

## 2024-08-03 DIAGNOSIS — G5701 Lesion of sciatic nerve, right lower limb: Secondary | ICD-10-CM

## 2024-08-03 DIAGNOSIS — M19111 Post-traumatic osteoarthritis, right shoulder: Secondary | ICD-10-CM | POA: Diagnosis not present

## 2024-08-03 DIAGNOSIS — M25511 Pain in right shoulder: Secondary | ICD-10-CM | POA: Diagnosis not present

## 2024-08-03 DIAGNOSIS — Z79899 Other long term (current) drug therapy: Secondary | ICD-10-CM | POA: Insufficient documentation

## 2024-08-03 DIAGNOSIS — M549 Dorsalgia, unspecified: Secondary | ICD-10-CM | POA: Insufficient documentation

## 2024-08-03 DIAGNOSIS — X58XXXA Exposure to other specified factors, initial encounter: Secondary | ICD-10-CM | POA: Diagnosis not present

## 2024-08-03 DIAGNOSIS — S46009A Unspecified injury of muscle(s) and tendon(s) of the rotator cuff of unspecified shoulder, initial encounter: Secondary | ICD-10-CM | POA: Diagnosis not present

## 2024-08-03 DIAGNOSIS — G8929 Other chronic pain: Secondary | ICD-10-CM | POA: Diagnosis not present

## 2024-08-03 DIAGNOSIS — G5681 Other specified mononeuropathies of right upper limb: Secondary | ICD-10-CM

## 2024-08-03 MED ORDER — ROPIVACAINE HCL 2 MG/ML IJ SOLN
9.0000 mL | Freq: Once | INTRAMUSCULAR | Status: AC
  Administered 2024-08-03: 9 mL via INTRA_ARTICULAR
  Filled 2024-08-03: qty 20

## 2024-08-03 MED ORDER — METHYLPREDNISOLONE ACETATE 80 MG/ML IJ SUSP
80.0000 mg | Freq: Once | INTRAMUSCULAR | Status: AC
  Administered 2024-08-03: 80 mg via INTRA_ARTICULAR
  Filled 2024-08-03: qty 1

## 2024-08-03 MED ORDER — DIAZEPAM 5 MG PO TABS
5.0000 mg | ORAL_TABLET | ORAL | Status: AC
  Administered 2024-08-03: 5 mg via ORAL

## 2024-08-03 MED ORDER — DEXAMETHASONE SOD PHOSPHATE PF 10 MG/ML IJ SOLN
10.0000 mg | Freq: Once | INTRAMUSCULAR | Status: AC
  Administered 2024-08-03: 10 mg

## 2024-08-03 MED ORDER — LIDOCAINE HCL 2 % IJ SOLN
20.0000 mL | Freq: Once | INTRAMUSCULAR | Status: AC
  Administered 2024-08-03: 400 mg
  Filled 2024-08-03: qty 20

## 2024-08-03 MED ORDER — DIAZEPAM 5 MG PO TABS
ORAL_TABLET | ORAL | Status: AC
  Filled 2024-08-03: qty 1

## 2024-08-03 MED ORDER — IOHEXOL 180 MG/ML  SOLN
INTRAMUSCULAR | Status: AC
  Filled 2024-08-03: qty 20

## 2024-08-03 NOTE — Patient Instructions (Signed)

## 2024-08-03 NOTE — Progress Notes (Signed)
 PROVIDER NOTE: Interpretation of information contained herein should be left to medically-trained personnel. Specific patient instructions are provided elsewhere under Patient Instructions section of medical record. This document was created in part using STT-dictation technology, any transcriptional errors that may result from this process are unintentional.  Patient: [name] Type: Established DOB: [birth year] MRN: [id number] PCP: Gretel App, NP  Service: Procedure DOS: 08/03/2024 Setting: Ambulatory Location: Ambulatory outpatient facility Delivery: Face-to-face Provider: Wallie Sherry, MD Specialty: Interventional Pain Management Specialty designation: 09 Location: Outpatient facility Ref. Prov.: Gretel App, NP       Interventional Therapy   Procedure: Suprascapular nerve block (SSNB) #2  Laterality:  Right  Level: Superior to scapular spine, lateral to supraspinatus fossa (Suprascapular notch).  Imaging: Fluoroscopic guidance         Anesthesia: Local anesthesia (1-2% Lidocaine ) DOS: 08/03/2024  Performed by: Wallie Sherry, MD  Purpose: Diagnostic/Therapeutic Indications: Shoulder pain, severe enough to impact quality of life and/or function. 1. 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 Suprascapular nerve entrapment, right   3. SI joint arthritis   4. Chronic right SI joint pain   5. Piriformis syndrome of right side    NAS-11 score:   Pre-procedure: 8 /10   Post-procedure: 8 /10     Target: Suprascapular nerve Location: midway between the medial border of the scapula and the acromion as it runs through the suprascapular notch. Region: Suprascapular, posterior shoulder  Approach: Percutaneous  Neuroanatomy: The suprascapular nerve is the lateral branch of the superior trunk of the brachial plexus. It receives nerve fibers that originate in the nerve roots C5 and C6 (and sometimes C4). It is a mixed nerve, meaning that it provides both  sensory and motor supply for the suprascapular region. Function: The main function of this nerve is to provide motor innervation for two muscles, the supraspinatus and infraspinatus muscles. They are part of the rotator cuff muscles. In addition, the suprascapular nerve provides a sensory supply to the joints of the scapula (glenohumeral and acromioclavicular joints). Rationale (medical necessity): procedure needed and proper for the diagnosis and/or treatment of the patient's medical symptoms and needs.  Position / Prep / Materials:  Position: Prone Materials:  Tray: Block Needle(s):  Type: Spinal  Gauge (G): 22  Length: 3.5 in.  Qty: 1 Prep solution: ChloraPrep (2% chlorhexidine  gluconate and 70% isopropyl alcohol) Prep Area: Entire posterior shoulder area. From upper spine to shoulder proper (upper arm), and from lateral neck to lower tip of shoulder blade.   H&P (Pre-op Assessment):  Ms. Morabito is a 68 y.o. (year old), female patient, seen today for interventional treatment. She  has a past surgical history that includes Back surgery; Breast surgery; Augmentation mammaplasty (Bilateral, 2006); Esophagogastroduodenoscopy (egd) with propofol  (N/A, 10/24/2020); Esophagogastroduodenoscopy (egd) with propofol  (N/A, 12/20/2020); Esophageal manometry (N/A, 03/04/2021); 24 hour ph study (N/A, 03/04/2021); PH impedance study (N/A, 03/04/2021); Vein Surgery (1991); Upper gastrointestinal endoscopy; Colonoscopy; Anterior cervical decomp/discectomy fusion (N/A, 05/12/2022); and Esophagogastroduodenoscopy (egd) with propofol  (N/A, 11/11/2022). Ms. Balicki has a current medication list which includes the following prescription(s): amlodipine , aspirin , atorvastatin , cyanocobalamin , levothyroxine , lubiprostone, triamterene -hydrochlorothiazide , turmeric, and vitamin d . Her primarily concern today is the Shoulder Pain and Back Pain  Initial Vital Signs:  Pulse/HCG Rate: 72ECG Heart Rate: 71 Temp: (!) 97.3 F  (36.3 C) Resp: 18 BP: 113/79 SpO2: 100 %  BMI: Estimated body mass index is 26.63 kg/m as calculated from the following:   Height as of this encounter: 5' 5 (1.651  m).   Weight as of this encounter: 160 lb (72.6 kg).  Risk Assessment: Allergies: Reviewed. She has no known allergies.  Allergy Precautions: None required Coagulopathies: Reviewed. None identified.  Blood-thinner therapy: None at this time Active Infection(s): Reviewed. None identified. Ms. Redlich is afebrile  Site Confirmation: Ms. Macfarlane was asked to confirm the procedure and laterality before marking the site Procedure checklist: Completed Consent: Before the procedure and under the influence of no sedative(s), amnesic(s), or anxiolytics, the patient was informed of the treatment options, risks and possible complications. To fulfill our ethical and legal obligations, as recommended by the American Medical Association's Code of Ethics, I have informed the patient of my clinical impression; the nature and purpose of the treatment or procedure; the risks, benefits, and possible complications of the intervention; the alternatives, including doing nothing; the risk(s) and benefit(s) of the alternative treatment(s) or procedure(s); and the risk(s) and benefit(s) of doing nothing. The patient was provided information about the general risks and possible complications associated with the procedure. These may include, but are not limited to: failure to achieve desired goals, infection, bleeding, organ or nerve damage, allergic reactions, paralysis, and death. In addition, the patient was informed of those risks and complications associated to the procedure, such as failure to decrease pain; infection; bleeding; organ or nerve damage with subsequent damage to sensory, motor, and/or autonomic systems, resulting in permanent pain, numbness, and/or weakness of one or several areas of the body; allergic reactions; (i.e.: anaphylactic  reaction); and/or death. Furthermore, the patient was informed of those risks and complications associated with the medications. These include, but are not limited to: allergic reactions (i.e.: anaphylactic or anaphylactoid reaction(s)); adrenal axis suppression; blood sugar elevation that in diabetics may result in ketoacidosis or comma; water retention that in patients with history of congestive heart failure may result in shortness of breath, pulmonary edema, and decompensation with resultant heart failure; weight gain; swelling or edema; medication-induced neural toxicity; particulate matter embolism and blood vessel occlusion with resultant organ, and/or nervous system infarction; and/or aseptic necrosis of one or more joints. Finally, the patient was informed that Medicine is not an exact science; therefore, there is also the possibility of unforeseen or unpredictable risks and/or possible complications that may result in a catastrophic outcome. The patient indicated having understood very clearly. We have given the patient no guarantees and we have made no promises. Enough time was given to the patient to ask questions, all of which were answered to the patient's satisfaction. Ms. Stern has indicated that she wanted to continue with the procedure. Attestation: I, the ordering provider, attest that I have discussed with the patient the benefits, risks, side-effects, alternatives, likelihood of achieving goals, and potential problems during recovery for the procedure that I have provided informed consent. Date  Time: 08/03/2024 12:31 PM  Pre-Procedure Preparation:  Monitoring: As per clinic protocol. Respiration, ETCO2, SpO2, BP, heart rate and rhythm monitor placed and checked for adequate function Safety Precautions: Patient was assessed for positional comfort and pressure points before starting the procedure. Time-out: I initiated and conducted the Time-out before starting the procedure, as per  protocol. The patient was asked to participate by confirming the accuracy of the Time Out information. Verification of the correct person, site, and procedure were performed and confirmed by me, the nursing staff, and the patient. Time-out conducted as per Joint Commission's Universal Protocol (UP.01.01.01). Time: 1335 Start Time: 1335 hrs.  Description of Procedure:  Procedural Technique Safety Precautions: Aspiration looking for blood return was conducted prior to all injections. At no point did we inject any substances, as a needle was being advanced. No attempts were made at seeking any paresthesias. Safe injection practices and needle disposal techniques used. Medications properly checked for expiration dates. SDV (single dose vial) medications used. Description of the Procedure: Protocol guidelines were followed. The patient was placed in position over the procedure table. The target area was identified and the area prepped in the usual manner. Skin & deeper tissues infiltrated with local anesthetic. Appropriate amount of time allowed to pass for local anesthetics to take effect. The procedure needles were then advanced to the target area. Proper needle placement secured. Negative aspiration confirmed. Solution injected in intermittent fashion, asking for systemic symptoms every 0.5cc of injectate. The needles were then removed and the area cleansed, making sure to leave some of the prepping solution back to take advantage of its long term bactericidal properties. 5 cc solution made of 4 cc of 0.2% ropivacaine , 1 cc of Decadron  10 mg/cc.  Injected along the right suprascapular nerve after contrast confirmation   Vitals:   08/03/24 1257 08/03/24 1335 08/03/24 1344  BP: 113/79 114/78 119/74  Pulse: 72    Resp: 18 16 16   Temp: (!) 97.3 F (36.3 C)    TempSrc: Temporal    SpO2: 100% 100% 99%  Weight: 160 lb (72.6 kg)    Height: 5' 5 (1.651 m)       Start Time: 1335 hrs. End  Time: 1340 hrs.  Imaging Guidance (nonSpinal):          Type of Imaging Technique: Fluoroscopy Guidance (nonSpinal) Indication(s): Fluoroscopy guidance for needle placement to enhance accuracy in procedures requiring precise needle localization for targeted delivery of medication in or near specific anatomical locations not easily accessible without such real-time imaging assistance. Exposure Time: Please see nurses notes. Contrast: Before injecting any contrast, we confirmed that the patient did not have an allergy to iodine, shellfish, or radiological contrast. Once satisfactory needle placement was completed at the desired level, radiological contrast was injected. Contrast injected under live fluoroscopy. No contrast complications. See chart for type and volume of contrast used. Fluoroscopic Guidance: I was personally present during the use of fluoroscopy. Tunnel Vision Technique used to obtain the best possible view of the target area. Parallax error corrected before commencing the procedure. Direction-depth-direction technique used to introduce the needle under continuous pulsed fluoroscopy. Once target was reached, antero-posterior, oblique, and lateral fluoroscopic projection used confirm needle placement in all planes. Images permanently stored in EMR. Interpretation: I personally interpreted the imaging intraoperatively. Adequate needle placement confirmed in multiple planes. Appropriate spread of contrast into desired area was observed. No evidence of afferent or efferent intravascular uptake. No intrathecal or subarachnoid spread observed. Permanent images saved into the patient's record.  Antibiotic Prophylaxis:   Anti-infectives (From admission, onward)    None      Indication(s): None identified   Post-operative Assessment:  Post-procedure Vital Signs:  Pulse/HCG Rate: 7272 Temp: (!) 97.3 F (36.3 C) Resp: 16 BP: 119/74 SpO2: 99 %  EBL: None  Complications: No  immediate post-treatment complications observed by team, or reported by patient.  Note: The patient tolerated the entire procedure well. A repeat set of vitals were taken after the procedure and the patient was kept under observation following institutional policy, for this type of procedure. Post-procedural neurological assessment was performed, showing return to baseline, prior to discharge. The patient was  provided with post-procedure discharge instructions, including a section on how to identify potential problems. Should any problems arise concerning this procedure, the patient was given instructions to immediately contact us , at any time, without hesitation. In any case, we plan to contact the patient by telephone for a follow-up status report regarding this interventional procedure.  Comments:  No additional relevant information.  Plan of Care (POC)  Orders:  Orders Placed This Encounter  Procedures   DG PAIN CLINIC C-ARM 1-60 MIN NO REPORT    Intraoperative interpretation by procedural physician at [facility].    Standing Status:   Standing    Number of Occurrences:   1    Reason for exam::   Assistance in needle guidance and placement for procedures requiring needle placement in or near specific anatomical locations not easily accessible without such assistance.    Medications ordered for procedure: Meds ordered this encounter  Medications   lidocaine  (XYLOCAINE ) 2 % (with pres) injection 400 mg   methylPREDNISolone  acetate (DEPO-MEDROL ) injection 80 mg   ropivacaine  (PF) 2 mg/mL (0.2%) (NAROPIN ) injection 9 mL   dexamethasone  (DECADRON ) injection 10 mg   diazepam  (VALIUM ) tablet 5 mg    Make sure Flumazenil is available in the pyxis when using this medication. If oversedation occurs, administer 0.2 mg IV over 15 sec. If after 45 sec no response, administer 0.2 mg again over 1 min; may repeat at 1 min intervals; not to exceed 4 doses (1 mg)   Medications  administered: We administered lidocaine , methylPREDNISolone  acetate, ropivacaine  (PF) 2 mg/mL (0.2%), dexamethasone , and diazepam .  See the medical record for exact dosing, route, and time of administration.  Follow-up plan:   Return in about 3 months (around 11/03/2024) for PPE, F2F.       Right SI-J and Right Piriformis 04/17/21, 09/02/21. 11/12/22,08/03/24 RIGHT LFCN 06/17/23  Right glenohumeral joint injection 12/21/2023 Right suprascapular nerve block 02/10/2024, 08/03/24     Recent Visits Date Type Provider Dept  07/28/24 Office Visit Marcelino Nurse, MD [facility]  Showing recent visits within past 90 days and meeting all other requirements Today's Visits Date Type Provider Dept  08/03/24 Procedure visit Marcelino Nurse, MD [facility]  Showing today's visits and meeting all other requirements Future Appointments No visits were found meeting these conditions. Showing future appointments within next 90 days and meeting all other requirements  Disposition: Discharge home  Discharge (Date  Time): 08/03/2024; 1347 hrs.   Primary Care Physician: Gretel App, NP Location: [facility] Note by: Nurse Marcelino, MD (TTS technology used. I apologize for any typographical errors that were not detected and corrected.) Date: 08/03/2024; Time: 2:27 PM  Disclaimer:  Medicine is not an visual merchandiser. The only guarantee in medicine is that nothing is guaranteed. It is important to note that the decision to proceed with this intervention was based on the information collected from the patient. The Data and conclusions were drawn from the patient's questionnaire, the interview, and the physical examination. Because the information was provided in large part by the patient, it cannot be guaranteed that it has not been purposely or unconsciously manipulated. Every effort has been made to obtain as much relevant data as possible for this evaluation. It is  important to note that the conclusions that lead to this procedure are derived in large part from the available data. Always take into account that the treatment will also be dependent on availability of resources and existing treatment guidelines, considered by  other Pain Management Practitioners as being common knowledge and practice, at the time of the intervention. For Medico-Legal purposes, it is also important to point out that variation in procedural techniques and pharmacological choices are the acceptable norm. The indications, contraindications, technique, and results of the above procedure should only be interpreted and judged by a Board-Certified Interventional Pain Specialist with extensive familiarity and expertise in the same exact procedure and technique.

## 2024-08-03 NOTE — Progress Notes (Signed)
 Safety precautions to be maintained throughout the outpatient stay will include: orient to surroundings, keep bed in low position, maintain call bell within reach at all times, provide assistance with transfer out of bed and ambulation.

## 2024-08-03 NOTE — Progress Notes (Signed)
 PROVIDER NOTE: Information contained herein reflects review and annotations entered in association with encounter. Interpretation of such information and data should be left to medically-trained personnel. Information provided to patient can be located elsewhere in the medical record under Patient Instructions. Document created using STT-dictation technology, any transcriptional errors that may result from process are unintentional.    Patient: [name]  Service Category: Procedure  Provider: Wallie Sherry, MD  DOB: [birth year]  DOS: 08/03/2024  Location: [facility]  MRN: [id number]  Setting: Ambulatory - outpatient  Referring Provider: Gretel App, NP  Type: Established Patient  Specialty: Interventional Pain Management  PCP: Gretel App, NP   Primary Reason for Visit: Interventional Pain Management Treatment. CC: Shoulder Pain and Back Pain   Procedure:          Anesthesia, Analgesia, Anxiolysis:  Type: Therapeutic Sacroiliac Joint Steroid Injection        5  & Right Piriformis Trigger point injection  under fluoroscopy #3  Region: Inferior Lumbosacral Region Level: PIIS (Posterior Inferior Iliac Spine) Laterality: Right-Side  Type: Local Anesthesia Po Valium  5 mg Local Anesthetic: Lidocaine  1-2%  Position: Prone           Indications: SI-J arthritis, piriformis syndrome  Pain Score: Pre-procedure: 8 /10 Post-procedure: 8 /10   Pre-op H&P Assessment:  [name] is a 68 y.o. (year old), female patient, seen today for interventional treatment. She  has a past surgical history that includes Back surgery; Breast surgery; Augmentation mammaplasty (Bilateral, 2006); Esophagogastroduodenoscopy (egd) with propofol  (N/A, 10/24/2020); Esophagogastroduodenoscopy (egd) with propofol  (N/A, 12/20/2020); Esophageal manometry (N/A, 03/04/2021); 24 hour ph study (N/A, 03/04/2021); PH impedance study (N/A, 03/04/2021); Vein Surgery (1991); Upper gastrointestinal endoscopy;  Colonoscopy; Anterior cervical decomp/discectomy fusion (N/A, 05/12/2022); and Esophagogastroduodenoscopy (egd) with propofol  (N/A, 11/11/2022). [name] has a current medication list which includes the following prescription(s): amlodipine , aspirin , atorvastatin , cyanocobalamin , levothyroxine , lubiprostone, triamterene -hydrochlorothiazide , turmeric, and vitamin d . Her primarily concern today is the Shoulder Pain and Back Pain   Initial Vital Signs:  Pulse/HCG Rate: 72ECG Heart Rate: 71 Temp: (!) 97.3 F (36.3 C) Resp: 18 BP: 113/79 SpO2: 100 %  BMI: Estimated body mass index is 26.63 kg/m as calculated from the following:   Height as of this encounter: 5' 5 (1.651 m).   Weight as of this encounter: 160 lb (72.6 kg).  Risk Assessment: Allergies: Reviewed. She has no known allergies.  Allergy Precautions: None required Coagulopathies: Reviewed. None identified.  Blood-thinner therapy: None at this time Active Infection(s): Reviewed. None identified. [name] is afebrile  Site Confirmation: [name] was asked to confirm the procedure and laterality before marking the site Procedure checklist: Completed Consent: Before the procedure and under the influence of no sedative(s), amnesic(s), or anxiolytics, the patient was informed of the treatment options, risks and possible complications. To fulfill our ethical and legal obligations, as recommended by the American Medical Association's Code of Ethics, I have informed the patient of my clinical impression; the nature and purpose of the treatment or procedure; the risks, benefits, and possible complications of the intervention; the alternatives, including doing nothing; the risk(s) and benefit(s) of the alternative treatment(s) or procedure(s); and the risk(s) and benefit(s) of doing nothing. The patient was provided information about the general risks and possible complications associated with the procedure. These may include, but are not  limited to: failure to achieve desired goals, infection, bleeding, organ or nerve damage, allergic reactions, paralysis, and death. In addition, the patient was informed of those risks  and complications associated to the procedure, such as failure to decrease pain; infection; bleeding; organ or nerve damage with subsequent damage to sensory, motor, and/or autonomic systems, resulting in permanent pain, numbness, and/or weakness of one or several areas of the body; allergic reactions; (i.e.: anaphylactic reaction); and/or death. Furthermore, the patient was informed of those risks and complications associated with the medications. These include, but are not limited to: allergic reactions (i.e.: anaphylactic or anaphylactoid reaction(s)); adrenal axis suppression; blood sugar elevation that in diabetics may result in ketoacidosis or comma; water retention that in patients with history of congestive heart failure may result in shortness of breath, pulmonary edema, and decompensation with resultant heart failure; weight gain; swelling or edema; medication-induced neural toxicity; particulate matter embolism and blood vessel occlusion with resultant organ, and/or nervous system infarction; and/or aseptic necrosis of one or more joints. Finally, the patient was informed that Medicine is not an exact science; therefore, there is also the possibility of unforeseen or unpredictable risks and/or possible complications that may result in a catastrophic outcome. The patient indicated having understood very clearly. We have given the patient no guarantees and we have made no promises. Enough time was given to the patient to ask questions, all of which were answered to the patient's satisfaction. [name] has indicated that she wanted to continue with the procedure. Attestation: I, the ordering provider, attest that I have discussed with the patient the benefits, risks, side-effects, alternatives, likelihood of achieving  goals, and potential problems during recovery for the procedure that I have provided informed consent. Date  Time: 08/03/2024 12:31 PM  Pre-Procedure Preparation:  Monitoring: As per clinic protocol. Respiration, ETCO2, SpO2, BP, heart rate and rhythm monitor placed and checked for adequate function Safety Precautions: Patient was assessed for positional comfort and pressure points before starting the procedure. Time-out: I initiated and conducted the Time-out before starting the procedure, as per protocol. The patient was asked to participate by confirming the accuracy of the Time Out information. Verification of the correct person, site, and procedure were performed and confirmed by me, the nursing staff, and the patient. Time-out conducted as per Joint Commission's Universal Protocol (UP.01.01.01). Time: 1335  Description of Procedure:          Target Area: Inferior, posterior, aspect of the sacroiliac fissure Approach: Posterior, paraspinal, ipsilateral approach. Area Prepped: Entire Lower Lumbosacral Region DuraPrep (Iodine Povacrylex [0.7% available iodine] and Isopropyl Alcohol, 74% w/w) Safety Precautions: Aspiration looking for blood return was conducted prior to all injections. At no point did we inject any substances, as a needle was being advanced. No attempts were made at seeking any paresthesias. Safe injection practices and needle disposal techniques used. Medications properly checked for expiration dates. SDV (single dose vial) medications used. Description of the Procedure: Protocol guidelines were followed. The patient was placed in position over the procedure table. The target area was identified and the area prepped in the usual manner. Skin & deeper tissues infiltrated with local anesthetic. Appropriate amount of time allowed to pass for local anesthetics to take effect. The procedure needle was advanced under fluoroscopic guidance into the sacroiliac joint until a firm  endpoint was obtained. Proper needle placement secured. Negative aspiration confirmed. Solution injected in intermittent fashion, asking for systemic symptoms every 0.5cc of injectate. The needles were then removed and the area cleansed, making sure to leave some of the prepping solution back to take advantage of its long term bactericidal properties. Vitals:   08/03/24 1257 08/03/24 1335 08/03/24 1344  BP: 113/79 114/78 119/74  Pulse: 72    Resp: 18 16 16   Temp: (!) 97.3 F (36.3 C)    TempSrc: Temporal    SpO2: 100% 100% 99%  Weight: 160 lb (72.6 kg)    Height: 5' 5 (1.651 m)      Start Time: 1335 hrs. End Time: 1340 hrs. Materials:  Needle(s) Type: Spinal Needle Gauge: 25G Length: 3.5-in Medication(s): Please see orders for medications and dosing details. 5 cc solution made of 4.5 cc of 0.2% ropivacaine , 0.5 cc of methylprednisolone , 80 mg/cc.  Injected into the right SI joint after contrast confirmation.  A right piriformis trigger point injection was also performed under fluoroscopy.  1 cm deep, 1 cm inferior and 1 cm lateral to the inferior SI joint fissure, the piriformis muscle was injected with contrast, muscle striations confirmed, no pain or paresthesias down right lower extremity.  5 cc solution made of 4.5 cc of 0.2% ropivacaine , 0.5 cc of methylprednisolone , 80 mg/cc was injected into the right piriformis muscle.  Imaging Guidance (Non-Spinal):          Type of Imaging Technique: Fluoroscopy Guidance (Non-Spinal) Indication(s): Assistance in needle guidance and placement for procedures requiring needle placement in or near specific anatomical locations not easily accessible without such assistance. Exposure Time: Please see nurses notes. Contrast: Before injecting any contrast, we confirmed that the patient did not have an allergy to iodine, shellfish, or radiological contrast. Once satisfactory needle placement was completed at the desired level, radiological contrast  was injected. Contrast injected under live fluoroscopy. No contrast complications. See chart for type and volume of contrast used. Fluoroscopic Guidance: I was personally present during the use of fluoroscopy. Tunnel Vision Technique used to obtain the best possible view of the target area. Parallax error corrected before commencing the procedure. Direction-depth-direction technique used to introduce the needle under continuous pulsed fluoroscopy. Once target was reached, antero-posterior, oblique, and lateral fluoroscopic projection used confirm needle placement in all planes. Images permanently stored in EMR. Interpretation: I personally interpreted the imaging intraoperatively. Adequate needle placement confirmed in multiple planes. Appropriate spread of contrast into desired area was observed. No evidence of afferent or efferent intravascular uptake. Permanent images saved into the patient's record.   Post-operative Assessment:  Post-procedure Vital Signs:  Pulse/HCG Rate: 7272 Temp: (!) 97.3 F (36.3 C) Resp: 16 BP: 119/74 SpO2: 99 %  EBL: None  Complications: No immediate post-treatment complications observed by team, or reported by patient.  Note: The patient tolerated the entire procedure well. A repeat set of vitals were taken after the procedure and the patient was kept under observation following institutional policy, for this type of procedure. Post-procedural neurological assessment was performed, showing return to baseline, prior to discharge. The patient was provided with post-procedure discharge instructions, including a section on how to identify potential problems. Should any problems arise concerning this procedure, the patient was given instructions to immediately contact us , at any time, without hesitation. In any case, we plan to contact the patient by telephone for a follow-up status report regarding this interventional procedure.  Comments:  No additional relevant  information.  Plan of Care   Orders:  Orders Placed This Encounter  Procedures   DG PAIN CLINIC C-ARM 1-60 MIN NO REPORT    Intraoperative interpretation by procedural physician at [facility].    Standing Status:   Standing    Number of Occurrences:   1    Reason for exam::   Assistance in needle guidance  and placement for procedures requiring needle placement in or near specific anatomical locations not easily accessible without such assistance.      Medications ordered for procedure: Meds ordered this encounter  Medications   lidocaine  (XYLOCAINE ) 2 % (with pres) injection 400 mg   methylPREDNISolone  acetate (DEPO-MEDROL ) injection 80 mg   ropivacaine  (PF) 2 mg/mL (0.2%) (NAROPIN ) injection 9 mL   dexamethasone  (DECADRON ) injection 10 mg   diazepam  (VALIUM ) tablet 5 mg    Make sure Flumazenil is available in the pyxis when using this medication. If oversedation occurs, administer 0.2 mg IV over 15 sec. If after 45 sec no response, administer 0.2 mg again over 1 min; may repeat at 1 min intervals; not to exceed 4 doses (1 mg)    Medications administered: We administered lidocaine , methylPREDNISolone  acetate, ropivacaine  (PF) 2 mg/mL (0.2%), dexamethasone , and diazepam .  See the medical record for exact dosing, route, and time of administration.  Follow-up plan:   Return in about 3 months (around 11/03/2024) for PPE, F2F.     Recent Visits Date Type Provider Dept  07/28/24 Office Visit Marcelino Nurse, MD [facility]  Showing recent visits within past 90 days and meeting all other requirements Today's Visits Date Type Provider Dept  08/03/24 Procedure visit Marcelino Nurse, MD [facility]  Showing today's visits and meeting all other requirements Future Appointments No visits were found meeting these conditions. Showing future appointments within next 90 days and meeting all other requirements  Disposition: Discharge home  Discharge (Date   Time): 08/03/2024; 1347 hrs.   Primary Care Physician: Gretel App, NP Location: [facility] Note by: Nurse Marcelino, MD Date: 08/03/2024; Time: 2:26 PM  Disclaimer:  Medicine is not an exact science. The only guarantee in medicine is that nothing is guaranteed. It is important to note that the decision to proceed with this intervention was based on the information collected from the patient. The Data and conclusions were drawn from the patient's questionnaire, the interview, and the physical examination. Because the information was provided in large part by the patient, it cannot be guaranteed that it has not been purposely or unconsciously manipulated. Every effort has been made to obtain as much relevant data as possible for this evaluation. It is important to note that the conclusions that lead to this procedure are derived in large part from the available data. Always take into account that the treatment will also be dependent on availability of resources and existing treatment guidelines, considered by other Pain Management Practitioners as being common knowledge and practice, at the time of the intervention. For Medico-Legal purposes, it is also important to point out that variation in procedural techniques and pharmacological choices are the acceptable norm. The indications, contraindications, technique, and results of the above procedure should only be interpreted and judged by a Board-Certified Interventional Pain Specialist with extensive familiarity and expertise in the same exact procedure and technique.

## 2024-08-04 ENCOUNTER — Other Ambulatory Visit: Payer: Self-pay | Admitting: Nurse Practitioner

## 2024-08-04 ENCOUNTER — Telehealth: Payer: Self-pay | Admitting: *Deleted

## 2024-08-04 DIAGNOSIS — E039 Hypothyroidism, unspecified: Secondary | ICD-10-CM

## 2024-08-04 NOTE — Telephone Encounter (Signed)
 Dottie, pls contact patient and inquire if goodrx card out of pocket cost of  $87.95 is acceptable or not. THX.

## 2024-08-04 NOTE — Telephone Encounter (Signed)
 Post procedure call; patient was very concerned on yesterday when her mobility was decreased in her arm of the shoulder that we worked on.  Better today and it went just as BL and recovery nurse told her.

## 2024-08-07 ENCOUNTER — Emergency Department

## 2024-08-07 ENCOUNTER — Emergency Department
Admission: EM | Admit: 2024-08-07 | Discharge: 2024-08-07 | Disposition: A | Discharge status: Home / Self Care | Attending: Emergency Medicine | Admitting: Emergency Medicine

## 2024-08-07 ENCOUNTER — Other Ambulatory Visit: Payer: Self-pay

## 2024-08-07 DIAGNOSIS — I10 Essential (primary) hypertension: Secondary | ICD-10-CM | POA: Diagnosis not present

## 2024-08-07 DIAGNOSIS — R079 Chest pain, unspecified: Secondary | ICD-10-CM

## 2024-08-07 DIAGNOSIS — R0789 Other chest pain: Secondary | ICD-10-CM | POA: Insufficient documentation

## 2024-08-07 DIAGNOSIS — E039 Hypothyroidism, unspecified: Secondary | ICD-10-CM | POA: Insufficient documentation

## 2024-08-07 LAB — TROPONIN T, HIGH SENSITIVITY: Troponin T High Sensitivity: <15 ng/L (ref 0–19)

## 2024-08-07 LAB — BASIC METABOLIC PANEL WITH GFR
Anion gap: 13 (ref 5–15)
BUN: 16 mg/dL (ref 8–23)
CO2: 20 mmol/L — ABNORMAL LOW (ref 22–32)
Calcium: 9.8 mg/dL (ref 8.9–10.3)
Chloride: 106 mmol/L (ref 98–111)
Creatinine, Ser: 0.72 mg/dL (ref 0.44–1.00)
GFR, Estimated: >60 mL/min (ref >60)
Glucose, Bld: 84 mg/dL (ref 70–99)
Potassium: 3.8 mmol/L (ref 3.5–5.1)
Sodium: 140 mmol/L (ref 135–145)

## 2024-08-07 LAB — CBC
HCT: 40.2 % (ref 36.0–46.0)
Hemoglobin: 13.1 g/dL (ref 12.0–15.0)
MCH: 29.5 pg (ref 26.0–34.0)
MCHC: 32.6 g/dL (ref 30.0–36.0)
MCV: 90.5 fL (ref 80.0–100.0)
Platelets: 260 K/uL (ref 150–400)
RBC: 4.44 MIL/uL (ref 3.87–5.11)
RDW: 13.1 % (ref 11.5–15.5)
WBC: 5.9 K/uL (ref 4.0–10.5)
nRBC: 0 % (ref 0.0–0.2)

## 2024-08-07 LAB — D-DIMER, QUANTITATIVE: D-Dimer, Quant: 0.35 ug{FEU}/mL (ref 0.00–0.50)

## 2024-08-07 MED ORDER — ALUM & MAG HYDROXIDE-SIMETH 200-200-20 MG/5ML PO SUSP
30.0000 mL | Freq: Once | ORAL | Status: AC
  Administered 2024-08-07: 30 mL via ORAL
  Filled 2024-08-07: qty 30

## 2024-08-07 MED ORDER — LIDOCAINE VISCOUS HCL 2 % MT SOLN
15.0000 mL | Freq: Once | OROMUCOSAL | Status: AC
  Administered 2024-08-07: 15 mL via ORAL
  Filled 2024-08-07: qty 15

## 2024-08-07 MED ORDER — FAMOTIDINE 20 MG PO TABS: 20.0000 mg | ORAL_TABLET | Freq: Two times a day (BID) | ORAL | 0 refills | Status: AC

## 2024-08-07 MED ORDER — PANTOPRAZOLE SODIUM 40 MG PO TBEC
40.0000 mg | DELAYED_RELEASE_TABLET | Freq: Once | ORAL | Status: AC
  Administered 2024-08-07: 40 mg via ORAL
  Filled 2024-08-07: qty 1

## 2024-08-07 NOTE — Discharge Instructions (Addendum)
 Please be sure to call your cardiologist to schedule a follow-up appointment soon.  I have also given you a prescription for Pepcid  that he can take for acid reflux.

## 2024-08-07 NOTE — ED Triage Notes (Signed)
 Pt to ED for mid chest tightness since last night. Non radiating. States felt 'heavy to breathe' last night. States just feels 'off.' Does not take blood thinners.  Respirations unlabored, skin dry.

## 2024-08-07 NOTE — ED Provider Notes (Signed)
 [name] Provider Note    Event Date/Time   First MD Initiated Contact with Patient 08/07/24 1130     (approximate)   History   Chest Pain   HPI  [name] is a 68 y.o. female with history of anxiety, GERD, hypertension, hypothyroidism, chronic pain, presenting with chest pain that started last night.  Is nonradiating, describes it as tightness, no shortness of breath but she felt like her breathing was heavy yesterday.  She denies any infectious symptoms, no leg swelling.  No recent travel or surgeries, no history of malignancies, no history of blood clots or leg swelling.  States that she thought her symptoms were due to GERD and took some Tums yesterday which did not seem to help.  On independent chart review, she had an echo done in 2023 that showed an EF of 60-65%, grade 1 diastolic dysfunction, had a CT coronary in 24 that showed normal coronary calcium  score of 0, no evidence of CAD.      Physical Exam   Triage Vital Signs: ED Triage Vitals  Encounter Vitals Group     BP 08/07/24 1048 137/83     Girls Systolic BP Percentile --      Girls Diastolic BP Percentile --      Boys Systolic BP Percentile --      Boys Diastolic BP Percentile --      Pulse Rate 08/07/24 1048 83     Resp 08/07/24 1048 18     Temp 08/07/24 1048 98.6 F (37 C)     Temp Source 08/07/24 1048 Oral     SpO2 08/07/24 1048 99 %     Weight 08/07/24 1046 159 lb (72.1 kg)     Height 08/07/24 1046 5' 5 (1.651 m)     Head Circumference --      Peak Flow --      Pain Score 08/07/24 1045 8     Pain Loc --      Pain Education --      Exclude from Growth Chart --     Most recent vital signs: Vitals:   08/07/24 1048  BP: 137/83  Pulse: 83  Resp: 18  Temp: 98.6 F (37 C)  SpO2: 99%     General: Awake, no distress.  CV:  Good peripheral perfusion.  Resp:  Normal effort.  Clear Abd:  No distention.  Soft nontender Other:  Nontoxic-appearing, no unilateral calf  sign or tenderness, no lower extremity edema   ED Results / Procedures / Treatments   Labs (all labs ordered are listed, but only abnormal results are displayed) Labs Reviewed  BASIC METABOLIC PANEL WITH GFR - Abnormal; Notable for the following components:      Result Value   CO2 20 (*)    All other components within normal limits  CBC  D-DIMER, QUANTITATIVE  TROPONIN T, HIGH SENSITIVITY     EKG  EKG shows, sinus rhythm with sinus arrhythmia, rate of 73, normal QS, normal QTc, no obvious ischemic ST elevation, not significantly changed compared to prior   RADIOLOGY On my independent interpretation, chest x-ray without obvious consolidation   PROCEDURES:  Critical Care performed: No  Procedures   MEDICATIONS ORDERED IN ED: Medications  alum & mag hydroxide-simeth (MAALOX/MYLANTA) 200-200-20 MG/5ML suspension 30 mL (30 mLs Oral Given 08/07/24 1216)    And  lidocaine  (XYLOCAINE ) 2 % viscous mouth solution 15 mL (15 mLs Oral Given 08/07/24 1216)  pantoprazole  (PROTONIX ) EC  tablet 40 mg (40 mg Oral Given 08/07/24 1215)     IMPRESSION / MDM / ASSESSMENT AND PLAN / ED COURSE  I reviewed the triage vital signs and the nursing notes.                              Differential diagnosis includes, but is not limited to, angina, ACS, did consider CHF but patient does not appear volume overloaded at this time, did consider PE but patient has no other risk factors for it.  Did also consider GERD, acid reflux, she denies any cough, fever, congestion suggest infectious etiology.  Will get labs, D-dimer, EKG, troponin, chest x-ray, GI cocktail, reassess.  Patient's presentation is most consistent with acute presentation with potential threat to life or bodily function.  Independent interpretation of labs and imaging below.  On reassessment patient is feeling a lot better.  Considered but no indication for inpatient admission at this time, she safe for outpatient management.  Labs  are reassuring, chest x-ray without obvious consolidation.  Did discuss with her about calling her cardiologist on Monday to set up a follow-up appointment.  Will also give her a prescription for Pepcid .  Shared decision making done with patient and she is agreeable with this plan.  Discharge.  Strict return precautions given.    Clinical Course as of 08/07/24 1303  Sun Aug 07, 2024  1134 DG Chest 2 View No active cardiopulmonary disease.  [TT]  1150 Independent review of labs, LFTs not severely deranged, troponin is not elevated, no leukocytosis. [TT]  1235 D-Dimer, Quant: 0.35 Not elevated [TT]    Clinical Course User Index [TT] Waymond Lorelle Cummins, MD     FINAL CLINICAL IMPRESSION(S) / ED DIAGNOSES   Final diagnoses:  Chest pain, unspecified type     Rx / DC Orders   ED Discharge Orders          Ordered    famotidine  (PEPCID ) 20 MG tablet  2 times daily        08/07/24 1302             Note:  This document was prepared using Dragon voice recognition software and may include unintentional dictation errors.    Waymond Lorelle Cummins, MD 08/07/24 [phone]

## 2024-08-08 DIAGNOSIS — H524 Presbyopia: Secondary | ICD-10-CM | POA: Diagnosis not present

## 2024-08-10 ENCOUNTER — Encounter: Payer: Self-pay | Admitting: Nurse Practitioner

## 2024-08-10 ENCOUNTER — Ambulatory Visit: Admitting: Nurse Practitioner

## 2024-08-10 VITALS — BP 106/66 | HR 69 | Temp 97.9°F | Ht 65.0 in | Wt 157.0 lb

## 2024-08-10 DIAGNOSIS — E039 Hypothyroidism, unspecified: Secondary | ICD-10-CM

## 2024-08-10 DIAGNOSIS — R1310 Dysphagia, unspecified: Secondary | ICD-10-CM | POA: Diagnosis not present

## 2024-08-10 DIAGNOSIS — Z23 Encounter for immunization: Secondary | ICD-10-CM | POA: Diagnosis not present

## 2024-08-10 DIAGNOSIS — I10 Essential (primary) hypertension: Secondary | ICD-10-CM

## 2024-08-10 DIAGNOSIS — I6381 Other cerebral infarction due to occlusion or stenosis of small artery: Secondary | ICD-10-CM | POA: Diagnosis not present

## 2024-08-10 DIAGNOSIS — K5900 Constipation, unspecified: Secondary | ICD-10-CM

## 2024-08-10 DIAGNOSIS — M810 Age-related osteoporosis without current pathological fracture: Secondary | ICD-10-CM

## 2024-08-10 DIAGNOSIS — R519 Headache, unspecified: Secondary | ICD-10-CM

## 2024-08-10 DIAGNOSIS — K219 Gastro-esophageal reflux disease without esophagitis: Secondary | ICD-10-CM

## 2024-08-10 MED ORDER — OMEPRAZOLE 40 MG PO CPDR: 40.0000 mg | DELAYED_RELEASE_CAPSULE | Freq: Every day | ORAL | 0 refills | Status: AC

## 2024-08-10 NOTE — Progress Notes (Signed)
 Medication Samples have been provided to the patient.  Drug name: Linzess        Strength: 145 mcg        Qty: 2 boxes  LOT: [id number]  Exp.Date: 11/2024  Dosing instructions: Take one tablet daily  The patient has been instructed regarding the correct time, dose, and frequency of taking this medication, including desired effects and most common side effects.   Laurier Ellen 2:47 PM 08/10/2024

## 2024-08-10 NOTE — Progress Notes (Signed)
 Jasmine Glance, NP-C Phone: [phone]  [name] is a 68 y.o. female who presents today for follow up.   Discussed the use of AI scribe software for clinical note transcription with the patient, who gave verbal consent to proceed.  History of Present Illness   [name] is a 68 year old female with a history of stroke and cervical spine surgery who presents with chest pain and concerns about acid reflux.  She experiences sudden onset chest pain in the middle of the night, which did not improve with Tums. The pain caused significant anxiety, preventing her from sleeping due to fear of dying in her sleep. The following day, she continued to experience chest tightness, prompting an ER visit where no significant findings were noted. She denies shortness of breath, dizziness, or swelling.  She has a history of stroke, previously identified during an ER visit with CT and MRI. She is confused about the diagnosis, having been told it was a mini-stroke but later informed that such a diagnosis cannot be made from those tests. She is concerned about the potential for another stroke and questions whether she is currently on heart medication, though she reports taking aspirin  and blood pressure medication.  She has undergone cervical spine surgery and reports ongoing back pain and arm issues, including tingling and numbness. She reports being told she has nerve damage and has declined further surgery. A recent nerve injection temporarily affected her arm mobility, causing distress and impacting her ability to exercise.  She is concerned about her acid reflux, which she reports was mentioned by other providers as a possible cause for her chest pain. She has been prescribed omeprazole  and famotidine  but is hesitant to take them due to concerns about side effects. She has undergone previous throat stretching procedures and is scheduled for another one next month. She reports difficulty swallowing at  times, particularly at night.  Her current medications include aspirin , levothyroxine , and a cholesterol medication, which she takes at night. She also takes various supplements, including vitamin D , vitamin E, and turmeric, and has recently started collagen. She is mindful of her diet, having reduced alcohol intake and avoiding known triggers for her acid reflux, such as spicy and acidic foods.      Social History   Tobacco Use  Smoking Status Never  Smokeless Tobacco Never    Current Outpatient Medications on File Prior to Visit  Medication Sig Dispense Refill   amLODipine  (NORVASC ) 5 MG tablet Take 1 tablet (5 mg total) by mouth daily. 90 tablet 3   Aspirin  81 MG CAPS Take by mouth daily at 8 pm.     atorvastatin  (LIPITOR) 20 MG tablet Take 1 tablet by mouth once daily 90 tablet 0   Cyanocobalamin  (VITAMIN B 12 PO) Take 1 tablet by mouth daily at 6 (six) AM.     famotidine  (PEPCID ) 20 MG tablet Take 1 tablet (20 mg total) by mouth 2 (two) times daily. 60 tablet 0   triamterene -hydrochlorothiazide  (MAXZIDE-25) 37.5-25 MG tablet Take 0.5 tablets by mouth daily. 45 tablet 3   TURMERIC PO Take 2 capsules by mouth daily at 6 (six) AM.     VITAMIN D  PO Take 1 tablet by mouth daily at 6 (six) AM.     No current facility-administered medications on file prior to visit.     ROS see history of present illness  Objective  Physical Exam Vitals:   08/10/24 1406  BP: 106/66  Pulse: 69  Temp:  97.9 F (36.6 C)  SpO2: 99%    BP Readings from Last 3 Encounters:  08/12/24 116/68  08/10/24 106/66  08/07/24 129/82   Wt Readings from Last 3 Encounters:  08/12/24 162 lb (73.5 kg)  08/10/24 157 lb (71.2 kg)  08/07/24 159 lb (72.1 kg)    Physical Exam Constitutional:      General: She is not in acute distress.    Appearance: Normal appearance.  HENT:     Head: Normocephalic.  Cardiovascular:     Rate and Rhythm: Normal rate and regular rhythm.     Heart sounds: Normal heart  sounds. No murmur heard. Pulmonary:     Effort: Pulmonary effort is normal.     Breath sounds: Normal breath sounds.  Skin:    General: Skin is warm and dry.  Neurological:     General: No focal deficit present.     Mental Status: She is alert.  Psychiatric:        Mood and Affect: Mood normal.        Behavior: Behavior normal.      Assessment/Plan: Please see individual problem list.  Gastroesophageal reflux disease without esophagitis Assessment & Plan: Intermittent chest pain and dysphagia are likely due to GERD, with cardiac causes ruled out. She is scheduled for esophageal dilation next month. Dietary triggers and medication side effects were discussed. Prescribe omeprazole  40 mg daily for 8 weeks, then reduce to 20 mg daily. Advise dietary modifications. We will continue to monitor.   Orders: -     Omeprazole ; Take 1 capsule (40 mg total) by mouth daily.  Dispense: 90 capsule; Refill: 0  Dysphagia, unspecified type Assessment & Plan: Follow up with GI as scheduled for esophageal dilation.   Orders: -     Omeprazole ; Take 1 capsule (40 mg total) by mouth daily.  Dispense: 90 capsule; Refill: 0  Constipation, unspecified constipation type Assessment & Plan: Chronic issue. Using Linzess  as needed. Sample provided today. Encourage increased water and fiber intake. Advise follow-up with gastroenterology as scheduled.    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 A cardiology follow-up is scheduled in two days. Continue aspirin  therapy and cholesterol management with Lipitor.   Hypothyroidism, unspecified type Assessment & Plan: Managed with levothyroxine . Continue. Check thyroid  panel today.   Orders: -     TSH+T4F+T3Free+ThyAbs+TPO+VD25  Essential hypertension Assessment & Plan: Her blood pressure is well-controlled with Norvasc  and  triamterene /hydrochlorothiazide . Continue triamterene /hydrochlorothiazide  37.5-25 mg half a tablet daily and Norvasc  5 mg daily.    Osteoporosis, unspecified osteoporosis type, unspecified pathological fracture presence -     TSH+T4F+T3Free+ThyAbs+TPO+VD25  Headache disorder -     Butalbital -APAP-Caffeine ; Take 1 tablet by mouth every 6 (six) hours as needed for headache.  Dispense: 60 tablet; Refill: 2  Need for influenza vaccination -     Flu vaccine trivalent PF, 6mos and older(Flulaval,Afluria,Fluarix,Fluzone)     Return in about 6 months (around 02/07/2025) for Follow up.   Jasmine Glance, NP-C [facility]

## 2024-08-11 ENCOUNTER — Encounter: Payer: Self-pay | Admitting: Nurse Practitioner

## 2024-08-11 ENCOUNTER — Ambulatory Visit: Admitting: Cardiology

## 2024-08-11 LAB — TSH+T4F+T3FREE+THYABS+TPO+VD25
Free T4: 1.91 ng/dL — ABNORMAL HIGH (ref 0.82–1.77)
T3, Free: 3.1 pg/mL (ref 2.0–4.4)
TSH: 0.319 u[IU]/mL — ABNORMAL LOW (ref 0.450–4.500)
Thyroglobulin Antibody: 5.4 [IU]/mL — ABNORMAL HIGH (ref 0.0–0.9)
Thyroperoxidase Ab SerPl-aCnc: 55 [IU]/mL — ABNORMAL HIGH (ref 0–34)
Vit D, 25-Hydroxy: 66.4 ng/mL (ref 30.0–100.0)

## 2024-08-12 ENCOUNTER — Ambulatory Visit: Payer: Self-pay | Admitting: Nurse Practitioner

## 2024-08-12 ENCOUNTER — Encounter: Payer: Self-pay | Admitting: Cardiology

## 2024-08-12 ENCOUNTER — Ambulatory Visit: Attending: Cardiology | Admitting: Cardiology

## 2024-08-12 VITALS — BP 116/68 | HR 80 | Resp 18 | Ht 65.0 in | Wt 162.0 lb

## 2024-08-12 DIAGNOSIS — I10 Essential (primary) hypertension: Secondary | ICD-10-CM

## 2024-08-12 DIAGNOSIS — I6381 Other cerebral infarction due to occlusion or stenosis of small artery: Secondary | ICD-10-CM

## 2024-08-12 DIAGNOSIS — R011 Cardiac murmur, unspecified: Secondary | ICD-10-CM

## 2024-08-12 DIAGNOSIS — K219 Gastro-esophageal reflux disease without esophagitis: Secondary | ICD-10-CM

## 2024-08-12 DIAGNOSIS — R0789 Other chest pain: Secondary | ICD-10-CM | POA: Diagnosis not present

## 2024-08-12 DIAGNOSIS — E039 Hypothyroidism, unspecified: Secondary | ICD-10-CM

## 2024-08-12 MED ORDER — LEVOTHYROXINE SODIUM 175 MCG PO TABS: 175.0000 ug | ORAL_TABLET | Freq: Every day | ORAL | 0 refills | Status: DC

## 2024-08-12 MED ORDER — BUTALBITAL-APAP-CAFFEINE 50-325-40 MG PO TABS: 1.0000 | ORAL_TABLET | Freq: Four times a day (QID) | ORAL | 2 refills | Status: AC | PRN

## 2024-08-12 NOTE — Patient Instructions (Addendum)
 Medication Instructions:  Your physician recommends that you continue on your current medications as directed. Please refer to the Current Medication list given to you today.   *If you need a refill on your cardiac medications before your next appointment, please call your pharmacy*  Lab Work: No labs ordered today  If you have labs (blood work) drawn today and your tests are completely normal, you will receive your results only by: MyChart Message (if you have MyChart) OR A paper copy in the mail If you have any lab test that is abnormal or we need to change your treatment, we will call you to review the results.  Testing/Procedures: Your physician has requested that you have an echocardiogram. Echocardiography is a painless test that uses sound waves to create images of your heart. It provides your doctor with information about the size and shape of your heart and how well your heart's chambers and valves are working.   You may receive an ultrasound enhancing agent through an IV if needed to better visualize your heart during the echo. This procedure takes approximately one hour.  There are no restrictions for this procedure.  This will take place at [address] [facility], [place] [address]  Please note: We ask at that you not bring children with you during ultrasound (echo/ vascular) testing. Due to room size and safety concerns, children are not allowed in the ultrasound rooms during exams. Our front office staff cannot provide observation of children in our lobby area while testing is being conducted. An adult accompanying a patient to their appointment will only be allowed in the ultrasound room at the discretion of the ultrasound technician under special circumstances. We apologize for any inconvenience.    Follow-Up: At [facility], you and your health needs are our priority.  As part of our continuing mission to provide you with exceptional heart  care, our providers are all part of one team.  This team includes your primary Cardiologist (physician) and Advanced Practice Providers or APPs (Physician Assistants and Nurse Practitioners) who all work together to provide you with the care you need, when you need it.  Your next appointment:   2 month(s)  Provider:   Tylene Lunch, NP

## 2024-08-12 NOTE — Progress Notes (Signed)
 Cardiology Office Note   Date:  08/12/2024  ID:  Jasmine, [name] 02-09-1956, MRN [id number] PCP: Gretel App, NP  [facility] Providers Cardiologist:  Lonni Hanson, MD Cardiology APP:  Gerard Frederick, NP     History of Present Illness [name] is a 67 y.o. female with past medical history of hypertension, hypothyroidism, arthritis, depression/anxiety, palpitations, lunar stroke, who is here today for follow-up after recent emergency department visit with complaints of chest pain.   September 2023 after MRI of the brain showed remote clinic or infarct with minimal chronic microvascular changes. She was asymptomatic at that time but continued to have weakness and decreased range of motion following an ACDF. Prior echocardiogram completed 07/17/2022 revealed an EF of 60 to 65%, no RWMA, G1 DD, left atrial size with mildly dilated, and mild mitral valve regurgitation with negative bubble study. Event monitor revealed rare PACs and PVCs and few episodes of brief PSVT. There was no atrial fibrillation/flutter was observed. She was continued on her current medication regimen.   She was seen in clinic in February 2024 by Dr. Hanson.  Been experience chest discomfort that began 5 days prior.  The pain persisted for several hours despite Tums and aspirin  prompted her to summon EMS.  EMS evaluation was unremarkable she declined transport to the emergency department.  She underwent upper endoscopy 2 days later which was unremarkable.  She continued to complain of chest pain 710 was administered aspirin  325 mg in the office and was transferred to the emergency department for further evaluation.  She presented to the emergency emergency department from the office with stable vitals.  Imaging was found to be reassuring.  Troponins were normal.  She denied any pain or discomfort.  She was able to be discharged home and was advised to return to the ED for recurrent symptoms.  She was seen in  clinic in April 2024 overall doing well with a cardiac perspective.  She been following with a GI provider and had a large quantity of reflux symptoms.  She been compliant with her current medication regimen.  There were no changes made and no further testing that was ordered.  She was last seen in clinic 06/23/2024 stating that she was doing well.  She had couple episodes of acid reflux but her continue to follow-up with gastro was about swallowing problems.  She has had a longstanding history of dysphagia.  She states that she is dumping her cholesterol medication and had triamterene -HCTZ because someone told her both these medications were for her kidneys.  She denied any other associated symptoms.  She was restarted on her medications.  There was no further testing that was ordered at that time.   She was evaluated in [facility] emergency department 08/07/2024 with complaints of chest pain.  She describes a tightness, no shortness of breath but she felt like her breathing was heavy yesterday.  Denied any sick contacts or travel.  Initial vital signs revealed blood pressure 137/83, pulse of 83, respirations of 18, temperature of 98.6.  EKG revealed sinus rhythm with a rate of 73 with no ischemic changes.  She was treated with a GI cocktail and Protonix .  Labs were reassuring, chest x-ray without obvious consolidation, D-dimer and high-sensitivity troponins were negative.  She was considered stable for discharge with outpatient follow-up.  She returns clinic today stating that she was recently evaluated in [facility] emergency department with complaint of chest pain.  She stated that it happened after she had  eaten and was lying down.  She was told the emergency department that it was likely GERD.  She does have an upcoming appointment with gastro to have the esophageal dilatation completed as she continues to have difficulty swallowing.  She also noted that she has just been feeling off and not herself but her thyroid   has also been out of whack and she stated that her Synthroid  had to be readjusted.  She stated that she has been compliant with her medications.  She is upset at this time stating that in the emergency department she was told that she indeed had a stroke and she always was under the impression that she had had a TIA.  ROS: 10 point review of system has been reviewed and considered negative the exception was listed in the HPI  Studies Reviewed      Coronary CTA 12/04/22 IMPRESSION: 1. Normal coronary calcium  score of 0. Patient is low risk.   2. Normal coronary origin with right dominance.   3. No evidence of CAD.   4. CAD-RADS 0. Consider non-atherosclerotic causes of chest pain.   EP Procedures and Devices: Event monitor (06/13/2022): Patient was monitored for 11 days, 23 hours.  Predominantly sinus rhythm with rare PACs and PVCs as well as a few brief episodes of PSVT lasting up to 8 beats.  No evidence of atrial fibrillation/flutter.   Non-Invasive Evaluation(s): TTE with bubble study (07/17/2022): Normal LV size with borderline LVH.  LVEF 60-65% with normal wall motion.  Grade 1 diastolic dysfunction.  Normal RV size and function.  Mild left atrial enlargement.  Mild mitral regurgitation.  Otherwise, no significant valvular abnormality.  CVP 3.  Bubble study negative for interatrial shunt.  Risk Assessment/Calculations           Physical Exam VS:  BP 116/68 (BP Location: Left Arm, Patient Position: Sitting, Cuff Size: Normal)   Pulse 80   Resp 18   Ht 5' 5 (1.651 m)   Wt 162 lb (73.5 kg)   SpO2 98%   BMI 26.96 kg/m        Wt Readings from Last 3 Encounters:  08/12/24 162 lb (73.5 kg)  08/10/24 157 lb (71.2 kg)  08/07/24 159 lb (72.1 kg)    GEN: Well nourished, well developed in no acute distress NECK: No JVD; No carotid bruits CARDIAC: RRR, I/VI systolic murmur, without rubs or gallops RESPIRATORY:  Clear to auscultation without rales, wheezing or rhonchi  ABDOMEN:  Soft, non-tender, non-distended EXTREMITIES:  No edema; No deformity   ASSESSMENT AND PLAN Atypical chest pain she was recently evaluated [facility] emergency department.  She was advised most likely occurred as her workup was unrevealing.  Prior coronary CTA completed in March 2024 revealed a calcium  score of 0.  EKG in the emergency department was unrevealing.  She is continued on aspirin  81 mg daily and atorvastatin  20 mg daily.  Heart murmur noted on exam.  She has been scheduled for an updated echocardiogram to rule out any structural or functional abnormalities.  Primary hypertension with a blood pressure today 116/68.  She is continued on amlodipine  5 mg daily and triamterene -HCTZ 37.5/25 mg to half a tablet daily.  She has been encouraged to continue to monitor pressures 1 to 2 hours postmedication administration at home as well.  Lacunar infarct which was an incidental finding on a brain MRI in the setting of dizziness.  CT of the head and neck did not show cervical intracranial stenosis.  Study was negative.  She believes that she had a stroke when she had surgery when she was under anesthesia.  She is continued on aspirin  81 mg daily and atorvastatin  20 mg daily.  Hypothyroidism with a drop in her TSH and an elevated free T4.  Her levothyroxine  was recently decreased by her primary care provider.  Ongoing management per PCP.  GERD she continues to have complaints of reflux symptoms and dysphagia.  She is encouraged to continue with her PPI therapy and keep all follow-ups with GI.       Dispo: Patient to return to clinic to see MD/APP after echocardiogram is completed or sooner if needed  Signed, Pearlee Arvizu, NP

## 2024-08-16 ENCOUNTER — Other Ambulatory Visit: Payer: Self-pay | Admitting: Cardiology

## 2024-08-23 ENCOUNTER — Encounter: Payer: Self-pay | Admitting: Nurse Practitioner

## 2024-08-23 NOTE — Assessment & Plan Note (Signed)
 Managed with levothyroxine . Continue. Check thyroid  panel today.

## 2024-08-23 NOTE — Assessment & Plan Note (Signed)
 Follow up with GI as scheduled for esophageal dilation.

## 2024-08-23 NOTE — Assessment & Plan Note (Signed)
 Her blood pressure is well-controlled with Norvasc  and triamterene /hydrochlorothiazide . Continue triamterene /hydrochlorothiazide  37.5-25 mg half a tablet daily and Norvasc  5 mg daily.

## 2024-08-23 NOTE — Assessment & Plan Note (Signed)
 Intermittent chest pain and dysphagia are likely due to GERD, with cardiac causes ruled out. She is scheduled for esophageal dilation next month. Dietary triggers and medication side effects were discussed. Prescribe omeprazole  40 mg daily for 8 weeks, then reduce to 20 mg daily. Advise dietary modifications. We will continue to monitor.

## 2024-08-23 NOTE — Assessment & Plan Note (Signed)
 Chronic issue. Using Linzess  as needed. Sample provided today. Encourage increased water and fiber intake. Advise follow-up with gastroenterology as scheduled.

## 2024-08-23 NOTE — Assessment & Plan Note (Signed)
 She has a history of stroke with current preventative measures in place and concerns about recurrence risk. A cardiology follow-up is scheduled in two days. Continue aspirin  therapy and cholesterol management with Lipitor.

## 2024-08-30 ENCOUNTER — Ambulatory Visit: Admitting: Obstetrics & Gynecology

## 2024-09-08 ENCOUNTER — Encounter: Admitting: Gastroenterology

## 2024-09-20 ENCOUNTER — Ambulatory Visit: Payer: Self-pay

## 2024-09-20 ENCOUNTER — Other Ambulatory Visit: Payer: Self-pay | Admitting: Internal Medicine

## 2024-09-20 MED ORDER — SULFAMETHOXAZOLE-TRIMETHOPRIM 800-160 MG PO TABS: 1.0000 | ORAL_TABLET | Freq: Two times a day (BID) | ORAL | 0 refills | Status: DC

## 2024-09-20 NOTE — Telephone Encounter (Signed)
 FYI Only or Action Required?: Action required by provider: request for appointment. Refused UC  Patient was last seen in primary care on 08/10/2024 by Gretel App, NP.  Called Nurse Triage reporting Toe Injury.  Symptoms began a week ago.  Interventions attempted: OTC medications: antibiotic ointment.  Symptoms are: unchanged.  Triage Disposition: See Physician Within 24 Hours  Patient/caregiver understands and will follow disposition?: No, refuses disposition   Reason for Disposition  [1] Toe injury AND [2] bad limp or can't wear shoes or sandals  Answer Assessment - Initial Assessment Questions Patient called in to request appointment on 09/21/24 to evaluate toe injury she sustained one week ago. She had received a pedicure one week ago when the tip of her toe was cut, a chunk was removed. She did not seek evaluation and has been treating with otc anitbiotic ointment. She reports the site is swollen and scabbed, it is very painful to touch and preventing her from wearing shoes. She is currently in [place]  and will be returning home on 09/21/24. No appointments are available in clinic, she declines alternate clinic location due to no care, declines urgent care due to cost associated, she would like to be worked into clinic schedule any provider, she states she can wait until Friday or Monday, aware this clinical research associate advises evaluation within 24 hours.  Please advise if patient can be worked into clinic. Added appointment to wait list.    1. MECHANISM: How did the injury happen?      Pedicure-they cut tip of toe-scabbed 2. ONSET: When did the injury happen? (e.g., minutes, hours ago)      One week ago 3. LOCATION: What part of the toe is injured? Is the nail damaged?      Not  4. APPEARANCE of TOE INJURY: What does the injury look like?      swelling 5. SEVERITY: Can you use the foot normally? Can you walk?      Having difficulty wearing shoes  6. SIZE: For cuts,  bruises, or swelling, ask: How large is it? (e.g., inches or centimeters;  entire toe)      Tip of toe 7. PAIN: Is there pain? If Yes, ask: How bad is the pain?   What does it keep you from doing? (Scale 0-10; or none, mild, moderate, severe)     severe 8. TETANUS: For any breaks in the skin, ask: When was your last tetanus booster?      9. DIABETES: Do you have a history of diabetes or poor circulation in the feet?      10. OTHER SYMPTOMS: Do you have any other symptoms?  Protocols used: Toe Injury-A-AH Copied from CRM [id number]. Topic: Clinical - Red Word Triage >> Sep 20, 2024 10:28 AM Rea BROCKS wrote: Red Word that prompted transfer to Nurse Triage: Patient went to get a pedicure and salon cut top of toe, severe pain, swollen. Patient can't put on shoes right, very uncomfortable.

## 2024-09-20 NOTE — Addendum Note (Signed)
 Addended by: Dyron Kawano on: 09/20/2024 01:10 PM   Modules accepted: Orders

## 2024-09-20 NOTE — Telephone Encounter (Signed)
 Pt is currently out of town. I have scheduled pt to see Dr. Marylynn on 09/23/2024 at 3pm. Pt stated that she is okay with waiting till Friday.

## 2024-09-20 NOTE — Telephone Encounter (Signed)
 Spoke to pt. Pt is headed back to [address]. Pt stated she would take photos of toe and send them via mychart. Prescription was sent to [facility] per pt request

## 2024-09-21 ENCOUNTER — Other Ambulatory Visit: Payer: Self-pay | Admitting: Internal Medicine

## 2024-09-21 ENCOUNTER — Other Ambulatory Visit: Payer: Self-pay

## 2024-09-21 ENCOUNTER — Encounter: Payer: Self-pay | Admitting: Nurse Practitioner

## 2024-09-21 ENCOUNTER — Ambulatory Visit

## 2024-09-21 DIAGNOSIS — R011 Cardiac murmur, unspecified: Secondary | ICD-10-CM | POA: Diagnosis not present

## 2024-09-21 LAB — ECHOCARDIOGRAM COMPLETE
AR max vel: 2.27 cm2
AV Area VTI: 2.07 cm2
AV Area mean vel: 2.28 cm2
AV Mean grad: 5 mmHg
AV Peak grad: 10 mmHg
Ao pk vel: 1.58 m/s
Area-P 1/2: 4.49 cm2
S' Lateral: 2.8 cm

## 2024-09-21 MED ORDER — SULFAMETHOXAZOLE-TRIMETHOPRIM 800-160 MG PO TABS: 1.0000 | ORAL_TABLET | Freq: Two times a day (BID) | ORAL | 0 refills | Status: AC

## 2024-09-21 NOTE — Telephone Encounter (Signed)
 Rx has been sent to Dr. Tullo to send in

## 2024-09-21 NOTE — Addendum Note (Signed)
 Addended by: ORLANDO KINGDOM on: 09/21/2024 10:29 AM   Modules accepted: Orders

## 2024-09-21 NOTE — Telephone Encounter (Signed)
 Prescription resent in different encounter

## 2024-09-21 NOTE — Telephone Encounter (Unsigned)
 Copied from CRM [id number]. Topic: Clinical - Medication Question >> Sep 21, 2024  8:22 AM Mia F wrote: Pt is calling back to check on the status of the antibiotic that was sent yesterday. Pt says that she has checked with the pharmacy but its not there. Chart shows Bactrim was ordered yesterday. Please call pt with a status

## 2024-09-21 NOTE — Telephone Encounter (Signed)
 Script has been pended and routed to Dr. Marylynn in a separate note as original script did not go through due to it being attached to [facility] note

## 2024-09-22 ENCOUNTER — Ambulatory Visit: Payer: Self-pay | Admitting: Cardiology

## 2024-09-23 ENCOUNTER — Ambulatory Visit: Admitting: Internal Medicine

## 2024-09-23 NOTE — Telephone Encounter (Signed)
 Echo was resulted yesterday.

## 2024-09-27 ENCOUNTER — Encounter: Admitting: Gastroenterology

## 2024-09-29 ENCOUNTER — Ambulatory Visit
Admission: RE | Admit: 2024-09-29 | Discharge: 2024-09-29 | Disposition: A | Discharge status: Home / Self Care | Source: Ambulatory Visit | Attending: Student in an Organized Health Care Education/Training Program | Admitting: Student in an Organized Health Care Education/Training Program

## 2024-09-29 ENCOUNTER — Ambulatory Visit: Admitting: Student in an Organized Health Care Education/Training Program

## 2024-09-29 ENCOUNTER — Ambulatory Visit
Admission: RE | Admit: 2024-09-29 | Discharge: 2024-09-29 | Disposition: A | Discharge status: Home / Self Care | Attending: Student in an Organized Health Care Education/Training Program | Admitting: Student in an Organized Health Care Education/Training Program

## 2024-09-29 ENCOUNTER — Encounter: Payer: Self-pay | Admitting: Student in an Organized Health Care Education/Training Program

## 2024-09-29 VITALS — BP 131/99 | HR 79 | Temp 97.5°F | Resp 16 | Ht 65.0 in | Wt 159.0 lb

## 2024-09-29 DIAGNOSIS — G5701 Lesion of sciatic nerve, right lower limb: Secondary | ICD-10-CM | POA: Insufficient documentation

## 2024-09-29 DIAGNOSIS — M25561 Pain in right knee: Secondary | ICD-10-CM | POA: Insufficient documentation

## 2024-09-29 DIAGNOSIS — G8929 Other chronic pain: Secondary | ICD-10-CM | POA: Insufficient documentation

## 2024-09-29 DIAGNOSIS — W19XXXA Unspecified fall, initial encounter: Secondary | ICD-10-CM | POA: Insufficient documentation

## 2024-09-29 DIAGNOSIS — M533 Sacrococcygeal disorders, not elsewhere classified: Secondary | ICD-10-CM

## 2024-09-29 DIAGNOSIS — W19XXXS Unspecified fall, sequela: Secondary | ICD-10-CM | POA: Insufficient documentation

## 2024-09-29 DIAGNOSIS — Z79899 Other long term (current) drug therapy: Secondary | ICD-10-CM | POA: Insufficient documentation

## 2024-09-29 DIAGNOSIS — G588 Other specified mononeuropathies: Secondary | ICD-10-CM | POA: Insufficient documentation

## 2024-09-29 MED ORDER — KETOROLAC TROMETHAMINE 30 MG/ML IJ SOLN
30.0000 mg | Freq: Once | INTRAMUSCULAR | Status: AC
  Administered 2024-09-29: 30 mg via INTRAMUSCULAR
  Filled 2024-09-29: qty 1

## 2024-09-29 MED ORDER — METHOCARBAMOL 1000 MG/10ML IJ SOLN
200.0000 mg | Freq: Once | INTRAMUSCULAR | Status: AC
  Administered 2024-09-29: 200 mg via INTRAMUSCULAR
  Filled 2024-09-29: qty 10

## 2024-09-29 NOTE — Progress Notes (Signed)
 Safety precautions to be maintained throughout the outpatient stay will include: orient to surroundings, keep bed in low position, maintain call bell within reach at all times, provide assistance with transfer out of bed and ambulation.

## 2024-09-29 NOTE — Progress Notes (Signed)
 PROVIDER NOTE: Interpretation of information contained herein should be left to medically-trained personnel. Specific patient instructions are provided elsewhere under Patient Instructions section of medical record. This document was created in part using AI and STT-dictation technology, any transcriptional errors that may result from this process are unintentional.  Patient: [name]  Service: E/M   PCP: Jasmine App, NP  DOB: 1956-09-18  DOS: 09/29/2024  Provider: Wallie Sherry, MD  MRN: [id number]  Delivery: Face-to-face  Specialty: Interventional Pain Management  Type: Established Patient  Setting: Ambulatory outpatient facility  Specialty designation: 09  Referring Prov.: Jasmine App, NP  Location: Outpatient office facility       History of present illness (HPI) [name], a 69 y.o. year old female, is here today because of her Chronic pain of right knee [M25.561, G89.29]. [name] primary complain today is Back Pain (right)  Pertinent problems: [name] does not have any pertinent problems on file.  Pain Assessment: Severity of Chronic pain is reported as a 10-Worst pain ever/10. Location: Back Lower, Right/buttocks/hip down side of leg to ankle. Onset: More than a month ago. Quality: Aching, Constant, Restless. Timing: Constant. Modifying factor(s): rest, heat, ice. Vitals:  height is 5' 5 (1.651 m) and weight is 159 lb (72.1 kg). Her temperature is 97.5 F (36.4 C) (abnormal). Her blood pressure is 131/99 (abnormal) and her pulse is 79. Her respiration is 16 and oxygen saturation is 100%.  BMI: Estimated body mass index is 26.46 kg/m as calculated from the following:   Height as of this encounter: 5' 5 (1.651 m).   Weight as of this encounter: 159 lb (72.1 kg).  Last encounter: 07/28/2024. Last procedure: 08/03/2024.  Reason for encounter:   Post-Procedure Evaluation   Procedure: Suprascapular nerve block (SSNB) #2  Laterality:  Right  Level: Superior to  scapular spine, lateral to supraspinatus fossa (Suprascapular notch).  Imaging: Fluoroscopic guidance         Anesthesia: Local anesthesia (1-2% Lidocaine ) DOS: 08/03/2024  Performed by: Wallie Sherry, MD  Purpose: Diagnostic/Therapeutic Indications: Shoulder pain, severe enough to impact quality of life and/or function. 1. 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 Suprascapular nerve entrapment, right   3. SI joint arthritis   4. Chronic right SI joint pain   5. Piriformis syndrome of right side    NAS-11 score:   Pre-procedure: 8 /10   Post-procedure: 8 /10     Effectiveness:  Initial hour after procedure: 50 %  Subsequent 4-6 hours post-procedure: 50 %  Analgesia past initial 6 hours: 50 % (1 week)  Ongoing improvement:  Analgesic:  back to baseline  History of Present Illness   [name] is a 69 year old female who presents with right knee pain after a fall.  She fell while carrying a box and missed a step, landing on her right knee. Initially, the pain was mild, but it worsened by last night, making it difficult for her to descend stairs. The pain is acute and exacerbated by certain movements of the knee. She has been taking meloxicam  for pain management, with the last dose taken yesterday, but has not taken any today. She can ascend stairs but finds descending painful.  She experiences chronic pain originating from her hip and radiating down her leg, which predates the knee injury. She recalls a previous fall years ago that resulted in a meniscus injury.  She enjoys walking and working out as part of her  routine, which she finds beneficial for her mental well-being. However, she was unable to engage in these activities yesterday due to the knee pain.         ROS  Constitutional: Denies any fever or chills Gastrointestinal: No reported hemesis, hematochezia, vomiting, or acute GI distress Musculoskeletal: Right knee pain and  stiffness Neurological: No reported episodes of acute onset apraxia, aphasia, dysarthria, agnosia, amnesia, paralysis, loss of coordination, or loss of consciousness  Medication Review  Aspirin , Cyanocobalamin , Turmeric, Vitamin D , amLODipine , atorvastatin , butalbital -acetaminophen -caffeine , famotidine , levothyroxine , omeprazole , sulfamethoxazole -trimethoprim , and triamterene -hydrochlorothiazide   History Review  Allergy: [name] has no known allergies. Drug: [name]  reports no history of drug use. Alcohol:  reports current alcohol use of about 3.0 standard drinks of alcohol per week. Tobacco:  reports that she has never smoked. She has never used smokeless tobacco. Social: [name]  reports that she has never smoked. She has never used smokeless tobacco. She reports current alcohol use of about 3.0 standard drinks of alcohol per week. She reports that she does not use drugs. Medical:  has a past medical history of Anemia, Anxiety, Arthritis, Bronchitis, COVID-19 (07/08/2019), Depression, GERD (gastroesophageal reflux disease), Hypertension, Hypothyroidism, Mini stroke, Osteoporosis, PTSD (post-traumatic stress disorder), Stroke (HCC) (2023), and Wears glasses. Surgical: [name]  has a past surgical history that includes Back surgery; Breast surgery; Augmentation mammaplasty (Bilateral, 2006); Esophagogastroduodenoscopy (egd) with propofol  (N/A, 10/24/2020); Esophagogastroduodenoscopy (egd) with propofol  (N/A, 12/20/2020); Esophageal manometry (N/A, 03/04/2021); 24 hour ph study (N/A, 03/04/2021); PH impedance study (N/A, 03/04/2021); Vein Surgery (1991); Upper gastrointestinal endoscopy; Colonoscopy; Anterior cervical decomp/discectomy fusion (N/A, 05/12/2022); and Esophagogastroduodenoscopy (egd) with propofol  (N/A, 11/11/2022). Family: family history includes Cancer in her father and sister; Colon cancer in her sister; Dementia in her mother; Diabetes in her father; Diabetes Mellitus I in  her son; Heart Problems in her father; Heart disease in her father, mother, sister, and sister; Hypertension in her mother; Lung cancer in her maternal uncle; Osteoporosis in her mother; Ovarian cancer in her sister; Sickle cell trait in her daughter; Skin cancer in an other family member; Vitiligo in her mother and son.  Laboratory Chemistry Profile   Renal Lab Results  Component Value Date   BUN 16 08/07/2024   CREATININE 0.72 08/07/2024   GFR 81.94 01/19/2024   GFRAA >60 10/04/2019   GFRNONAA >60 08/07/2024    Hepatic Lab Results  Component Value Date   AST 24 01/19/2024   ALT 21 01/19/2024   ALBUMIN 4.4 01/19/2024   ALKPHOS 44 01/19/2024   LIPASE 56 (H) 11/14/2022    Electrolytes Lab Results  Component Value Date   NA 140 08/07/2024   K 3.8 08/07/2024   CL 106 08/07/2024   CALCIUM  9.8 08/07/2024    Bone Lab Results  Component Value Date   VD25OH 66.4 08/10/2024    Inflammation (CRP: Acute Phase) (ESR: Chronic Phase) No results found for: CRP, ESRSEDRATE, LATICACIDVEN       Note: Above Lab results reviewed.  Recent Imaging Review  ECHOCARDIOGRAM COMPLETE    ECHOCARDIOGRAM REPORT       Patient Name:   OREAN GIARRATANO Date of Exam: 09/21/2024 Medical Rec #:  [id number]     Height:       65.0 in Accession #:    [id number]    Weight:       162.0 lb Date of Birth:  1956/03/23     BSA:          1.809 m Patient Age:  68 years      BP:           116/68 mmHg Patient Gender: F             HR:           65 bpm. Exam Location:  [place]  Procedure: 2D Echo, 3D Echo, Cardiac Doppler, Color Doppler and Strain Analysis            (Both Spectral and Color Flow Doppler were utilized during            procedure).  Indications:    R01.1 Murmur   History:        Patient has prior history of Echocardiogram examinations.                 Signs/Symptoms:Shortness of Breath, Chest Pain and Murmur; Risk                 Factors:Hypertension.   Sonographer:    Marshall Benders Referring Phys: [id number] SHERI HAMMOCK  IMPRESSIONS   1. Left ventricular ejection fraction, by estimation, is 60 to 65%. Left ventricular ejection fraction by 3D volume is 66 %. The left ventricle has normal function. The left ventricle has no regional wall motion abnormalities. Left ventricular diastolic  parameters were normal. The average left ventricular global longitudinal strain is -24.1 %. The global longitudinal strain is normal.  2. Right ventricular systolic function is normal. The right ventricular size is normal.  3. Left atrial size was mildly dilated.  4. The mitral valve is normal in structure. Mild mitral valve regurgitation. No evidence of mitral stenosis.  5. The aortic valve is tricuspid. Aortic valve regurgitation is mild. Aortic valve sclerosis is present, with no evidence of aortic valve stenosis.  6. The inferior vena cava is normal in size with greater than 50% respiratory variability, suggesting right atrial pressure of 3 mmHg.  Comparison(s): 07/17/2022 EF 60-65%.  FINDINGS  Left Ventricle: Left ventricular ejection fraction, by estimation, is 60 to 65%. Left ventricular ejection fraction by 3D volume is 66 %. The left ventricle has normal function. The left ventricle has no regional wall motion abnormalities. The average  left ventricular global longitudinal strain is -24.1 %. Strain was performed and the global longitudinal strain is normal. The left ventricular internal cavity size was normal in size. There is no left ventricular hypertrophy. Left ventricular diastolic  parameters were normal.  Right Ventricle: The right ventricular size is normal. No increase in right ventricular wall thickness. Right ventricular systolic function is normal.  Left Atrium: Left atrial size was mildly dilated.  Right Atrium: Right atrial size was normal in size.  Pericardium: There is no evidence of pericardial effusion.  Mitral Valve: The mitral valve is normal in  structure. Mild mitral valve regurgitation. No evidence of mitral valve stenosis.  Tricuspid Valve: The tricuspid valve is normal in structure. Tricuspid valve regurgitation is mild . No evidence of tricuspid stenosis.  Aortic Valve: The aortic valve is tricuspid. Aortic valve regurgitation is mild. Aortic valve sclerosis is present, with no evidence of aortic valve stenosis. Aortic valve mean gradient measures 5.0 mmHg. Aortic valve peak gradient measures 10.0 mmHg.  Aortic valve area, by VTI measures 2.07 cm.  Pulmonic Valve: The pulmonic valve was normal in structure. Pulmonic valve regurgitation is not visualized. No evidence of pulmonic stenosis.  Aorta: The aortic root is normal in size and structure.  Venous: The inferior vena cava is normal in size with greater than  50% respiratory variability, suggesting right atrial pressure of 3 mmHg.  IAS/Shunts: No atrial level shunt detected by color flow Doppler.  Additional Comments: 3D was performed not requiring image post processing on an independent workstation and was normal.    LEFT VENTRICLE PLAX 2D LVIDd:         4.80 cm         Diastology LVIDs:         2.80 cm         LV e' medial:    8.49 cm/s LV PW:         1.00 cm         LV E/e' medial:  9.1 LV IVS:        0.90 cm         LV e' lateral:   10.60 cm/s LVOT diam:     1.80 cm         LV E/e' lateral: 7.3 LV SV:         74 LV SV Index:   41              2D Longitudinal LVOT Area:     2.54 cm        Strain                                2D Strain GLS   -24.1 %                                Avg:                                  3D Volume EF                                LV 3D EF:    Left                                             ventricul                                             ar                                             ejection                                             fraction                                             by 3D  volume is                                             66 %.                                  3D Volume EF:                                3D EF:        66 %                                LV EDV:       132 ml                                LV ESV:       45 ml                                LV SV:        87 ml  RIGHT VENTRICLE             IVC RV Basal diam:  3.80 cm     IVC diam: 1.50 cm RV Mid diam:    3.00 cm RV S prime:     15.40 cm/s TAPSE (M-mode): 2.5 cm  LEFT ATRIUM             Index        RIGHT ATRIUM           Index LA diam:        4.20 cm 2.32 cm/m   RA Area:     21.60 cm LA Vol (A2C):   88.1 ml 48.71 ml/m  RA Volume:   67.00 ml  37.04 ml/m LA Vol (A4C):   68.1 ml 37.65 ml/m LA Biplane Vol: 80.0 ml 44.23 ml/m  AORTIC VALVE AV Area (Vmax):    2.27 cm AV Area (Vmean):   2.28 cm AV Area (VTI):     2.07 cm AV Vmax:           158.00 cm/s AV Vmean:          102.000 cm/s AV VTI:            0.358 m AV Peak Grad:      10.0 mmHg AV Mean Grad:      5.0 mmHg LVOT Vmax:         141.00 cm/s LVOT Vmean:        91.200 cm/s LVOT VTI:          0.291 m LVOT/AV VTI ratio: 0.81   AORTA Ao Root diam: 3.10 cm Ao Asc diam:  3.60 cm  MITRAL VALVE               TRICUSPID VALVE MV Area (PHT): 4.49 cm    TR Peak grad:   17.6 mmHg MV Decel Time: 169 msec    TR Vmax:        210.00 cm/s MV E velocity: 77.00 cm/s MV A velocity: 69.20  cm/s  SHUNTS MV E/A ratio:  1.11        Systemic VTI:  0.29 m                            Systemic Diam: 1.80 cm  Evalene Lunger MD Electronically signed by Evalene Lunger MD Signature Date/Time: 09/21/2024/5:52:48 PM      Final   Note: Reviewed        Physical Exam  Vitals: BP (!) 131/99   Pulse 79   Temp (!) 97.5 F (36.4 C)   Resp 16   Ht 5' 5 (1.651 m)   Wt 159 lb (72.1 kg)   SpO2 100%   BMI 26.46 kg/m  BMI: Estimated body mass index is 26.46 kg/m as calculated from the following:   Height as of this encounter: 5'  5 (1.651 m).   Weight as of this encounter: 159 lb (72.1 kg). Ideal: Ideal body weight: 57 kg (125 lb 10.6 oz) Adjusted ideal body weight: 63 kg (139 lb) General appearance: Well nourished, well developed, and well hydrated. In no apparent acute distress Mental status: Alert, oriented x 3 (person, place, & time)       Respiratory: No evidence of acute respiratory distress Eyes: PERLA Right knee pain and stiffness  Assessment   Diagnosis  1. Chronic pain of right knee   2. Fall, sequela   3. Chronic right SI joint pain   4. Piriformis syndrome of right side      Updated Problems: Problem  Chronic Pain of Right Knee  Fall    Plan of Care  Problem-specific:  Assessment and Plan    Chronic right knee pain following fall   Chronic right knee pain has worsened after a recent fall, with pain radiating from the hip down the leg. A possible meniscal injury is considered due to a history of similar injury. An x-ray of the right knee is ordered if pain persists by Monday. Robaxin  and Toradol  injections were administered for muscle relaxation and anti-inflammatory effects, respectively.  Acute right knee pain after fall   Acute right knee pain occurred after a fall on a step, significantly impairing stair navigation. Immediate imaging is not required unless symptoms persist. Toradol  was administered for acute pain management, and Robaxin  was given to alleviate muscle stiffness. She is advised to avoid meloxicam  for three days following the Toradol  injection.       [name] has a current medication list which includes the following long-term medication(s): amlodipine , atorvastatin , famotidine , levothyroxine , omeprazole , and triamterene -hydrochlorothiazide .  Pharmacotherapy (Medications Ordered): Meds ordered this encounter  Medications   ketorolac  (TORADOL ) 30 MG/ML injection 30 mg   methocarbamol  (ROBAXIN ) injection 200 mg   Orders:  Orders Placed This Encounter   Procedures   DG Knee Complete 4 Views Right    Standing Status:   Future    Number of Occurrences:   1    Expiration Date:   12/28/2024    Scheduling Instructions:     Please make sure that the patient understands that this needs to be done as soon as possible. Never have the patient do the imaging just before the next appointment. Inform patient that having the imaging done within the [facility] will expedite the availability of the results and will provide      imaging availability to the requesting physician. In addition inform the patient that the imaging order has an expiration date and will  not be renewed if not done within the active period.    Reason for Exam (SYMPTOM  OR DIAGNOSIS REQUIRED):   Right knee pain/arthralgia    Preferred imaging location?:   [facility]    Release to patient:   Immediate    Call Results- Best Contact Number?:   [phone] [facility]     Right SI-J and Right Piriformis 04/17/21, 09/02/21. 11/12/22,08/03/24 RIGHT LFCN 06/17/23  Right glenohumeral joint injection 12/21/2023 Right suprascapular nerve block 02/10/2024, 08/03/24    Return for PRN.    Recent Visits Date Type Provider Dept  08/03/24 Procedure visit Marcelino Nurse, MD [facility]  07/28/24 Office Visit Marcelino Nurse, MD [facility]  Showing recent visits within past 90 days and meeting all other requirements Today's Visits Date Type Provider Dept  09/29/24 Office Visit Marcelino Nurse, MD [facility]  Showing today's visits and meeting all other requirements Future Appointments No visits were found meeting these conditions. Showing future appointments within next 90 days and meeting all other requirements  I discussed the assessment and treatment plan with the patient. The patient was provided an opportunity to ask questions and all were answered. The patient agreed with the plan and demonstrated an  understanding of the instructions.  Patient advised to call back or seek an in-person evaluation if the symptoms or condition worsens. I personally spent a total of 30 minutes in the care of the patient today including preparing to see the patient, getting/reviewing separately obtained history, performing a medically appropriate exam/evaluation, counseling and educating, placing orders, and documenting clinical information in the EHR.   Note by: Nurse Marcelino, MD (TTS and AI technology used. I apologize for any typographical errors that were not detected and corrected.) Date: 09/29/2024; Time: 2:27 PM

## 2024-09-30 ENCOUNTER — Ambulatory Visit: Admitting: Obstetrics & Gynecology

## 2024-10-04 ENCOUNTER — Telehealth: Payer: Self-pay | Admitting: Student in an Organized Health Care Education/Training Program

## 2024-10-04 NOTE — Telephone Encounter (Signed)
[name] will call her

## 2024-10-04 NOTE — Telephone Encounter (Signed)
 Spoke with pt again she is now at the orthopedics she is in pain. I told pt he results were not in.

## 2024-10-04 NOTE — Telephone Encounter (Signed)
 Pt stated that she would like to know about her knee x-ray and her leg on the side of her hip shooting all the way down. The shot did not do anything. Today is the worse pain she ever experienced.

## 2024-10-05 ENCOUNTER — Telehealth: Payer: Self-pay | Admitting: Physician Assistant

## 2024-10-05 ENCOUNTER — Ambulatory Visit: Payer: Self-pay

## 2024-10-05 NOTE — Telephone Encounter (Signed)
 Patient called requesting change in appointment with [facility]. Message sent from [facility] front desk via [employer].  Called patient. Informed patient that she did have an appointment already scheduled with [facility], Dr. Jerri, in [place]. Patient stated she could not drive to [place]. Offered patient appointment with Maralee Gibbs at [facility] location. Patient scheduled for tomorrow 10/07/2023 with Dr. Mariah.

## 2024-10-05 NOTE — Telephone Encounter (Signed)
 Called and spoke with pt ans she stated she has an appt with an ortho provider on the 21st. She stated she does not like [facility] and [facility] where she is going has a provider who she had a bad experience with however she is scheduled to see someone else. I informed the pt to let us  know how she feels about this new provider and if she would like to go somewhere else. Pt verbalized understanding and stated she is unable to travel outside of [place] due to lack of transportation so she knows her options are limited, pt stated she will stay in touch with her  experience

## 2024-10-05 NOTE — Telephone Encounter (Signed)
 FYI Only or Action Required?: Referral fax to [facility] Fax#  [phone]     Patient was last seen in primary care on 08/10/2024 by Gretel App, NP.  Called Nurse Triage reporting Hip Pain.  Symptoms began chronic.  Interventions attempted: Prescription medications: pain management injectinos.  Symptoms are: stable.  Triage Disposition: See PCP Within 2 Weeks  Patient/caregiver understands and will follow disposition?: No   Reason for Disposition  Hip pain is a chronic symptom (recurrent or ongoing AND present > 4 weeks)  Answer Assessment - Initial Assessment Questions Tried to make appointment for new ortho appt, she needs a referral. Also needs surgical record but she cannot recall surgeons name or practice. She is asking for PCP App Gretel to provide her or new referral with surgical record or name of surgeon for her to request records.  Declined acute appointment, follow up visit scheduled next available in March.   Referral Fax: [phone] [facility]   1. LOCATION and RADIATION: Where is the pain located? Does the pain spread (shoot) anywhere else?     Right hip  2. QUALITY: What does the pain feel like?  (e.g., sharp, dull, aching, burning)     Aching radiating to leg  3. SEVERITY: How bad is the pain? What does it keep you from doing?   (Scale 1-10; or mild, moderate, severe)     moderate 4. ONSET: When did the pain start? Does it come and go, or is it there all the time?     Chronic-has injections 5. WORK OR EXERCISE: Has there been any recent work or exercise that involved this part of the body?      [place] last week-already evaluated 6. CAUSE: What do you think is causing the hip pain?      unsure 7. AGGRAVATING FACTORS: What makes the hip pain worse? (e.g., walking, climbing stairs, running)     work 8. OTHER SYMPTOMS: Do you have any other symptoms? (e.g., back pain, pain shooting down leg,  fever, rash)     Small bump on  hip  Protocols used: Hip Pain-A-AH Copied from CRM #[id number]. Topic: Clinical - Red Word Triage >> Oct 05, 2024  2:32 PM Adelita E wrote: [facility] that prompted transfer to Nurse Triage: Right hip pain. Patient stated she also feels a little bump on that hip.

## 2024-10-06 ENCOUNTER — Ambulatory Visit

## 2024-10-06 VITALS — BP 115/77 | HR 86 | Ht 65.0 in | Wt 161.4 lb

## 2024-10-06 DIAGNOSIS — G8929 Other chronic pain: Secondary | ICD-10-CM

## 2024-10-06 DIAGNOSIS — M25551 Pain in right hip: Secondary | ICD-10-CM | POA: Diagnosis not present

## 2024-10-06 DIAGNOSIS — M7551 Bursitis of right shoulder: Secondary | ICD-10-CM

## 2024-10-06 DIAGNOSIS — M25511 Pain in right shoulder: Secondary | ICD-10-CM

## 2024-10-06 DIAGNOSIS — M65251 Calcific tendinitis, right thigh: Secondary | ICD-10-CM

## 2024-10-06 MED ORDER — LIDOCAINE HCL 1 % IJ SOLN
5.0000 mL | INTRAMUSCULAR | Status: AC | PRN
  Administered 2024-10-06: 5 mL
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
## 2024-10-11 ENCOUNTER — Ambulatory Visit: Payer: Self-pay | Admitting: *Deleted

## 2024-10-11 NOTE — Telephone Encounter (Signed)
 Spoke with patient regarding results of knee x-ray.  She continues to have pain, but declines MRI due to cost.  She wants to come in to discuss shoulder pain.

## 2024-10-11 NOTE — Telephone Encounter (Signed)
-----   Message from Wallie Sherry, MD sent at 10/10/2024  3:46 PM EST ----- Regarding: call pt with xray results Call patient with x-ray results.  If she is continuing to have severe knee pain we can try and obtain additional information via a MRI

## 2024-10-12 ENCOUNTER — Encounter: Payer: Self-pay | Admitting: Nurse Practitioner

## 2024-10-12 ENCOUNTER — Ambulatory Visit: Admitting: Nurse Practitioner

## 2024-10-12 VITALS — BP 108/75 | HR 71 | Temp 97.2°F | Resp 18 | Ht 65.0 in | Wt 160.0 lb

## 2024-10-12 DIAGNOSIS — Z91199 Patient's noncompliance with other medical treatment and regimen due to unspecified reason: Secondary | ICD-10-CM

## 2024-10-12 DIAGNOSIS — Z5321 Procedure and treatment not carried out due to patient leaving prior to being seen by health care provider: Secondary | ICD-10-CM | POA: Diagnosis present

## 2024-10-12 DIAGNOSIS — G894 Chronic pain syndrome: Secondary | ICD-10-CM

## 2024-10-12 NOTE — Progress Notes (Signed)
 10/12/2024-Left without taking due to prolong wait time.

## 2024-10-12 NOTE — Progress Notes (Signed)
 Safety precautions to be maintained throughout the outpatient stay will include: orient to surroundings, keep bed in low position, maintain call bell within reach at all times, provide assistance with transfer out of bed and ambulation.

## 2024-10-13 ENCOUNTER — Encounter: Admitting: Orthopaedic Surgery

## 2024-10-13 ENCOUNTER — Ambulatory Visit: Attending: Cardiology | Admitting: Cardiology

## 2024-10-13 NOTE — Progress Notes (Unsigned)
 " Cardiology Office Note   Date:  10/13/2024  ID:  Jenesys, Casseus [birth year], MRN [id number] PCP: Gretel App, NP  [facility] Providers Cardiologist:  Lonni Hanson, MD Cardiology APP:  Gerard Frederick, NP { Click to update primary MD,subspecialty MD or APP then REFRESH:1}    History of Present Illness [name] is a 69 y.o. female with a past medical history of hypertension, hypothyroidism, arthritis, depression/anxiety, palpitations, low nuclear stroke, who presents today for follow-up.   September 2023 after MRI of the brain showed remote clinic or infarct with minimal chronic microvascular changes. She was asymptomatic at that time but continued to have weakness and decreased range of motion following an ACDF. Prior echocardiogram completed 07/17/2022 revealed an EF of 60 to 65%, no RWMA, G1 DD, left atrial size with mildly dilated, and mild mitral valve regurgitation with negative bubble study. Event monitor revealed rare PACs and PVCs and few episodes of brief PSVT. There was no atrial fibrillation/flutter was observed. She was continued on her current medication regimen.   She was seen in clinic in February 2024 by Dr. Hanson.  Been experience chest discomfort that began 5 days prior.  The pain persisted for several hours despite Tums and aspirin  prompted her to summon EMS.  EMS evaluation was unremarkable she declined transport to the emergency department.  She underwent upper endoscopy 2 days later which was unremarkable.  She continued to complain of chest pain 710 was administered aspirin  325 mg in the office and was transferred to the emergency department for further evaluation.  She presented to the emergency emergency department from the office with stable vitals.  Imaging was found to be reassuring.  Troponins were normal.  She denied any pain or discomfort.  She was able to be discharged home and was advised to return to the ED for recurrent symptoms.  She was seen  in clinic in April 2024 overall doing well with a cardiac perspective.  She been following with a GI provider and had a large quantity of reflux symptoms.  She been compliant with her current medication regimen.  There were no changes made and no further testing that was ordered.  She was last seen in clinic 06/23/2024 stating that she was doing well.  She had couple episodes of acid reflux but her continue to follow-up with gastro was about swallowing problems.  She has had a longstanding history of dysphagia.  She states that she is dumping her cholesterol medication and had triamterene -HCTZ because someone told her both these medications were for her kidneys.  She denied any other associated symptoms.  She was restarted on her medications.  There was no further testing that was ordered at that time.  She was evaluated in [facility] emergency department 08/07/2024 with complaints of chest pain.  She describes a tightness, no shortness of breath but she felt like her breathing was heavy yesterday.  Denied any sick contacts or travel.  Initial vital signs revealed blood pressure 137/83, pulse of 83, respirations of 18, temperature of 98.6.  EKG revealed sinus rhythm with a rate of 73 with no ischemic changes.  She was treated with a GI cocktail and Protonix .  Labs were reassuring, chest x-ray without obvious consolidation, D-dimer and high-sensitivity troponins were negative.  She was considered stable for discharge with outpatient follow-up.   She was last seen in clinic 08/13/2023 after recently being evaluated in the [facility] emergency department with complaint of chest pain.  She was upset during  her appointment and she was advised by the emergency department she had had a previous stroke when she was on the impression she had only had a mini stroke several years ago.  She was continued on her current medication regimen.  She was scheduled for an updated echocardiogram.   She returns to clinic today  ROS: 10  point review of systems has been reviewed and considered negative the exception was been listed in the HPI  Studies Reviewed     2d echo 09/21/2024 1. Left ventricular ejection fraction, by estimation, is 60 to 65%. Left  ventricular ejection fraction by 3D volume is 66 %. The left ventricle has  normal function. The left ventricle has no regional wall motion  abnormalities. Left ventricular diastolic   parameters were normal. The average left ventricular global longitudinal  strain is -24.1 %. The global longitudinal strain is normal.   2. Right ventricular systolic function is normal. The right ventricular  size is normal.   3. Left atrial size was mildly dilated.   4. The mitral valve is normal in structure. Mild mitral valve  regurgitation. No evidence of mitral stenosis.   5. The aortic valve is tricuspid. Aortic valve regurgitation is mild.  Aortic valve sclerosis is present, with no evidence of aortic valve  stenosis.   6. The inferior vena cava is normal in size with greater than 50%  respiratory variability, suggesting right atrial pressure of 3 mmHg.   Coronary CTA 12/04/22 IMPRESSION: 1. Normal coronary calcium  score of 0. Patient is low risk.   2. Normal coronary origin with right dominance.   3. No evidence of CAD.   4. CAD-RADS 0. Consider non-atherosclerotic causes of chest pain.   EP Procedures and Devices: Event monitor (06/13/2022): Patient was monitored for 11 days, 23 hours.  Predominantly sinus rhythm with rare PACs and PVCs as well as a few brief episodes of PSVT lasting up to 8 beats.  No evidence of atrial fibrillation/flutter.   Non-Invasive Evaluation(s): TTE with bubble study (07/17/2022): Normal LV size with borderline LVH.  LVEF 60-65% with normal wall motion.  Grade 1 diastolic dysfunction.  Normal RV size and function.  Mild left atrial enlargement.  Mild mitral regurgitation.  Otherwise, no significant valvular abnormality.  CVP 3.  Bubble study  negative for interatrial shunt.  Risk Assessment/Calculations           Physical Exam VS:  There were no vitals taken for this visit.       Wt Readings from Last 3 Encounters:  10/12/24 160 lb (72.6 kg)  10/06/24 161 lb 6.4 oz (73.2 kg)  09/29/24 159 lb (72.1 kg)    GEN: Well nourished, well developed in no acute distress NECK: No JVD; No carotid bruits CARDIAC: ***RRR, no murmurs, rubs, gallops RESPIRATORY:  Clear to auscultation without rales, wheezing or rhonchi  ABDOMEN: Soft, non-tender, non-distended EXTREMITIES:  No edema; No deformity   ASSESSMENT AND PLAN Primary hypertension Lunar infarct was an incidental finding on brain MRI in the setting of dizziness Hypothyroidism Heart murmur GERD     {Are you ordering a CV Procedure (e.g. stress test, cath, DCCV, TEE, etc)?   Press F2        :[id number]}  Dispo: ***  Signed, Sherl Yzaguirre, NP   "

## 2024-10-14 ENCOUNTER — Ambulatory Visit: Admitting: Cardiology

## 2024-10-14 NOTE — Progress Notes (Unsigned)
 " Cardiology Office Note   Date:  10/14/2024  ID:  Maris, Abascal September 18, 1956, MRN [id number] PCP: Gretel App, NP   [facility] Providers Cardiologist:  Lonni Hanson, MD Cardiology APP:  Gerard Frederick, NP { Click to update primary MD,subspecialty MD or APP then REFRESH:1}    History of Present Illness [name] is a 69 y.o. female with a past medical history of hypertension, hypothyroidism, arthritis, depression/anxiety, palpitations, low nuclear stroke, who presents today for follow-up.   September 2023 after MRI of the brain showed remote clinic or infarct with minimal chronic microvascular changes. She was asymptomatic at that time but continued to have weakness and decreased range of motion following an ACDF. Prior echocardiogram completed 07/17/2022 revealed an EF of 60 to 65%, no RWMA, G1 DD, left atrial size with mildly dilated, and mild mitral valve regurgitation with negative bubble study. Event monitor revealed rare PACs and PVCs and few episodes of brief PSVT. There was no atrial fibrillation/flutter was observed. She was continued on her current medication regimen.   She was seen in clinic in February 2024 by Dr. Hanson.  Been experience chest discomfort that began 5 days prior.  The pain persisted for several hours despite Tums and aspirin  prompted her to summon EMS.  EMS evaluation was unremarkable she declined transport to the emergency department.  She underwent upper endoscopy 2 days later which was unremarkable.  She continued to complain of chest pain 710 was administered aspirin  325 mg in the office and was transferred to the emergency department for further evaluation.  She presented to the emergency emergency department from the office with stable vitals.  Imaging was found to be reassuring.  Troponins were normal.  She denied any pain or discomfort.  She was able to be discharged home and was advised to return to the ED for recurrent symptoms.  She was seen  in clinic in April 2024 overall doing well with a cardiac perspective.  She been following with a GI provider and had a large quantity of reflux symptoms.  She been compliant with her current medication regimen.  There were no changes made and no further testing that was ordered.  She was last seen in clinic 06/23/2024 stating that she was doing well.  She had couple episodes of acid reflux but her continue to follow-up with gastro was about swallowing problems.  She has had a longstanding history of dysphagia.  She states that she is dumping her cholesterol medication and had triamterene -HCTZ because someone told her both these medications were for her kidneys.  She denied any other associated symptoms.  She was restarted on her medications.  There was no further testing that was ordered at that time.  She was evaluated in [facility] emergency department 08/07/2024 with complaints of chest pain.  She describes a tightness, no shortness of breath but she felt like her breathing was heavy yesterday.  Denied any sick contacts or travel.  Initial vital signs revealed blood pressure 137/83, pulse of 83, respirations of 18, temperature of 98.6.  EKG revealed sinus rhythm with a rate of 73 with no ischemic changes.  She was treated with a GI cocktail and Protonix .  Labs were reassuring, chest x-ray without obvious consolidation, D-dimer and high-sensitivity troponins were negative.  She was considered stable for discharge with outpatient follow-up.   She was last seen in clinic 08/13/2023 after recently being evaluated in the [facility] emergency department with complaint of chest pain.  She was upset during  her appointment and she was advised by the emergency department she had had a previous stroke when she was on the impression she had only had a mini stroke several years ago.  She was continued on her current medication regimen.  She was scheduled for an updated echocardiogram.   She returns to clinic today  ROS: 10  point review of systems has been reviewed and considered negative the exception was been listed in the HPI  Studies Reviewed     2d echo 09/21/2024 1. Left ventricular ejection fraction, by estimation, is 60 to 65%. Left  ventricular ejection fraction by 3D volume is 66 %. The left ventricle has  normal function. The left ventricle has no regional wall motion  abnormalities. Left ventricular diastolic   parameters were normal. The average left ventricular global longitudinal  strain is -24.1 %. The global longitudinal strain is normal.   2. Right ventricular systolic function is normal. The right ventricular  size is normal.   3. Left atrial size was mildly dilated.   4. The mitral valve is normal in structure. Mild mitral valve  regurgitation. No evidence of mitral stenosis.   5. The aortic valve is tricuspid. Aortic valve regurgitation is mild.  Aortic valve sclerosis is present, with no evidence of aortic valve  stenosis.   6. The inferior vena cava is normal in size with greater than 50%  respiratory variability, suggesting right atrial pressure of 3 mmHg.   Coronary CTA 12/04/22 IMPRESSION: 1. Normal coronary calcium  score of 0. Patient is low risk.   2. Normal coronary origin with right dominance.   3. No evidence of CAD.   4. CAD-RADS 0. Consider non-atherosclerotic causes of chest pain.   EP Procedures and Devices: Event monitor (06/13/2022): Patient was monitored for 11 days, 23 hours.  Predominantly sinus rhythm with rare PACs and PVCs as well as a few brief episodes of PSVT lasting up to 8 beats.  No evidence of atrial fibrillation/flutter.   Non-Invasive Evaluation(s): TTE with bubble study (07/17/2022): Normal LV size with borderline LVH.  LVEF 60-65% with normal wall motion.  Grade 1 diastolic dysfunction.  Normal RV size and function.  Mild left atrial enlargement.  Mild mitral regurgitation.  Otherwise, no significant valvular abnormality.  CVP 3.  Bubble study  negative for interatrial shunt.  Risk Assessment/Calculations   No BP recorded.  {Refresh Note OR Click here to enter BP  :1}***       Physical Exam VS:  There were no vitals taken for this visit.       Wt Readings from Last 3 Encounters:  10/12/24 160 lb (72.6 kg)  10/06/24 161 lb 6.4 oz (73.2 kg)  09/29/24 159 lb (72.1 kg)    GEN: Well nourished, well developed in no acute distress NECK: No JVD; No carotid bruits CARDIAC: ***RRR, no murmurs, rubs, gallops RESPIRATORY:  Clear to auscultation without rales, wheezing or rhonchi  ABDOMEN: Soft, non-tender, non-distended EXTREMITIES:  No edema; No deformity   ASSESSMENT AND PLAN Primary hypertension Lunar infarct was an incidental finding on brain MRI in the setting of dizziness Hypothyroidism Heart murmur GERD     {Are you ordering a CV Procedure (e.g. stress test, cath, DCCV, TEE, etc)?   Press F2        :[id number]}  Dispo: ***  Signed, Nayomi Tabron, NP   "

## 2024-10-15 ENCOUNTER — Emergency Department
Admission: EM | Admit: 2024-10-15 | Discharge: 2024-10-15 | Disposition: A | Discharge status: Home / Self Care | Attending: Emergency Medicine | Admitting: Emergency Medicine

## 2024-10-15 ENCOUNTER — Emergency Department

## 2024-10-15 ENCOUNTER — Other Ambulatory Visit: Payer: Self-pay

## 2024-10-15 DIAGNOSIS — R0789 Other chest pain: Secondary | ICD-10-CM | POA: Diagnosis present

## 2024-10-15 DIAGNOSIS — Z8616 Personal history of COVID-19: Secondary | ICD-10-CM | POA: Insufficient documentation

## 2024-10-15 DIAGNOSIS — I10 Essential (primary) hypertension: Secondary | ICD-10-CM | POA: Insufficient documentation

## 2024-10-15 DIAGNOSIS — Z8673 Personal history of transient ischemic attack (TIA), and cerebral infarction without residual deficits: Secondary | ICD-10-CM | POA: Insufficient documentation

## 2024-10-15 DIAGNOSIS — E039 Hypothyroidism, unspecified: Secondary | ICD-10-CM | POA: Diagnosis not present

## 2024-10-15 NOTE — ED Triage Notes (Signed)
 Pt arrives via POV with c/o left sided chest pain that started around 1200 today. Pt denies pain radiating. Pt endorses some SOB. Pt has a Hx of stroke unsure of when it happened. Pt is A&Ox4 and ambulatory during triage.
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
## 2024-10-20 ENCOUNTER — Other Ambulatory Visit: Payer: Self-pay | Admitting: Nurse Practitioner

## 2024-10-20 DIAGNOSIS — E039 Hypothyroidism, unspecified: Secondary | ICD-10-CM

## 2024-10-27 ENCOUNTER — Other Ambulatory Visit: Payer: Self-pay | Admitting: Nurse Practitioner

## 2024-10-27 DIAGNOSIS — E039 Hypothyroidism, unspecified: Secondary | ICD-10-CM

## 2024-11-01 ENCOUNTER — Ambulatory Visit: Admitting: Cardiology

## 2024-11-01 ENCOUNTER — Ambulatory Visit: Admitting: Student in an Organized Health Care Education/Training Program

## 2024-11-03 ENCOUNTER — Ambulatory Visit: Admitting: Student in an Organized Health Care Education/Training Program

## 2024-11-03 ENCOUNTER — Ambulatory Visit: Admitting: Family Medicine

## 2024-11-08 ENCOUNTER — Encounter: Admitting: Gastroenterology

## 2024-11-29 ENCOUNTER — Ambulatory Visit: Admitting: Nurse Practitioner
